# Patient Record
Sex: Female | Born: 1960 | Hispanic: No | Marital: Married | Smoking: Former smoker
Health system: Southern US, Community
[De-identification: ages and names within clinical notes are randomized; demographics above are authoritative.]

## PROBLEM LIST (undated history)

## (undated) DIAGNOSIS — F329 Major depressive disorder, single episode, unspecified: Secondary | ICD-10-CM

## (undated) DIAGNOSIS — R0600 Dyspnea, unspecified: Secondary | ICD-10-CM

## (undated) DIAGNOSIS — E1129 Type 2 diabetes mellitus with other diabetic kidney complication: Secondary | ICD-10-CM

## (undated) DIAGNOSIS — Z973 Presence of spectacles and contact lenses: Secondary | ICD-10-CM

## (undated) DIAGNOSIS — E785 Hyperlipidemia, unspecified: Secondary | ICD-10-CM

## (undated) DIAGNOSIS — N1831 Chronic kidney disease, stage 3a: Secondary | ICD-10-CM

## (undated) DIAGNOSIS — G5600 Carpal tunnel syndrome, unspecified upper limb: Secondary | ICD-10-CM

## (undated) DIAGNOSIS — F32A Depression, unspecified: Secondary | ICD-10-CM

## (undated) DIAGNOSIS — I10 Essential (primary) hypertension: Secondary | ICD-10-CM

## (undated) DIAGNOSIS — D649 Anemia, unspecified: Secondary | ICD-10-CM

## (undated) DIAGNOSIS — M810 Age-related osteoporosis without current pathological fracture: Secondary | ICD-10-CM

## (undated) DIAGNOSIS — E119 Type 2 diabetes mellitus without complications: Secondary | ICD-10-CM

## (undated) DIAGNOSIS — M1990 Unspecified osteoarthritis, unspecified site: Secondary | ICD-10-CM

## (undated) DIAGNOSIS — F419 Anxiety disorder, unspecified: Secondary | ICD-10-CM

## (undated) DIAGNOSIS — J449 Chronic obstructive pulmonary disease, unspecified: Secondary | ICD-10-CM

## (undated) DIAGNOSIS — J45909 Unspecified asthma, uncomplicated: Secondary | ICD-10-CM

## (undated) DIAGNOSIS — N189 Chronic kidney disease, unspecified: Secondary | ICD-10-CM

## (undated) DIAGNOSIS — K219 Gastro-esophageal reflux disease without esophagitis: Secondary | ICD-10-CM

## (undated) DIAGNOSIS — E039 Hypothyroidism, unspecified: Secondary | ICD-10-CM

## (undated) HISTORY — PX: OTHER SURGICAL HISTORY: SHX169

---
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
## 1991-01-16 HISTORY — PX: COLONOSCOPY WITH ESOPHAGOGASTRODUODENOSCOPY (EGD): SHX5779

## 1991-09-07 HISTORY — PX: TUBAL LIGATION: SHX77

## 1996-09-06 HISTORY — PX: ABDOMINAL HYSTERECTOMY: SHX81

## 2006-04-25 ENCOUNTER — Ambulatory Visit: Payer: Self-pay | Admitting: Family Medicine

## 2006-05-18 ENCOUNTER — Ambulatory Visit: Payer: Self-pay

## 2006-08-11 ENCOUNTER — Ambulatory Visit: Payer: Self-pay | Admitting: Family Medicine

## 2007-09-21 ENCOUNTER — Ambulatory Visit: Payer: Self-pay | Admitting: Family Medicine

## 2009-02-03 ENCOUNTER — Ambulatory Visit: Payer: Self-pay | Admitting: Unknown Physician Specialty

## 2009-02-04 ENCOUNTER — Ambulatory Visit: Payer: Self-pay | Admitting: Unknown Physician Specialty

## 2009-03-06 ENCOUNTER — Ambulatory Visit: Payer: Self-pay | Admitting: Unknown Physician Specialty

## 2009-06-05 ENCOUNTER — Ambulatory Visit: Payer: Self-pay | Admitting: Orthopedic Surgery

## 2009-09-06 HISTORY — PX: ROTATOR CUFF REPAIR: SHX139

## 2009-10-23 ENCOUNTER — Ambulatory Visit: Payer: Self-pay | Admitting: Orthopedic Surgery

## 2009-10-30 ENCOUNTER — Ambulatory Visit: Payer: Self-pay | Admitting: Orthopedic Surgery

## 2009-12-12 ENCOUNTER — Ambulatory Visit: Payer: Self-pay | Admitting: Orthopedic Surgery

## 2010-03-04 ENCOUNTER — Ambulatory Visit: Payer: Self-pay | Admitting: Unknown Physician Specialty

## 2011-05-19 ENCOUNTER — Ambulatory Visit: Payer: Self-pay | Admitting: Unknown Physician Specialty

## 2011-09-07 HISTORY — PX: CHOLECYSTECTOMY: SHX55

## 2011-10-28 ENCOUNTER — Ambulatory Visit: Payer: Self-pay | Admitting: Internal Medicine

## 2011-12-07 ENCOUNTER — Ambulatory Visit: Payer: Self-pay | Admitting: Unknown Physician Specialty

## 2011-12-09 ENCOUNTER — Ambulatory Visit: Payer: Self-pay | Admitting: Surgery

## 2012-03-03 ENCOUNTER — Ambulatory Visit: Payer: Self-pay | Admitting: Unknown Physician Specialty

## 2012-03-03 HISTORY — PX: COLONOSCOPY WITH ESOPHAGOGASTRODUODENOSCOPY (EGD): SHX5779

## 2012-03-07 LAB — PATHOLOGY REPORT

## 2012-06-13 ENCOUNTER — Ambulatory Visit: Payer: Self-pay | Admitting: Physician Assistant

## 2012-06-16 ENCOUNTER — Ambulatory Visit: Payer: Self-pay | Admitting: Physician Assistant

## 2013-06-19 ENCOUNTER — Ambulatory Visit: Payer: Self-pay | Admitting: Physician Assistant

## 2014-06-20 ENCOUNTER — Ambulatory Visit: Payer: Self-pay | Admitting: Physician Assistant

## 2014-12-29 NOTE — Op Note (Signed)
PATIENT NAME:  Wanda Smith, Wanda Smith MR#:  [id number] DATE OF BIRTH:  11/11/1960  DATE OF PROCEDURE:  12/09/2011  PREOPERATIVE DIAGNOSIS: Chronic cholecystitis, cholelithiasis.   POSTOPERATIVE DIAGNOSIS: Chronic cholecystitis, cholelithiasis.   PROCEDURE: Laparoscopic cholecystectomy, cholangiogram.   SURGEON: Adella HareJ. Wilton Arturo Sofranko, MD  ANESTHESIA: General.   INDICATION: This 54 year old female has history of epigastric pains. She had ultrasound findings of gallstones, also fatty liver. Surgery was recommended for definitive treatment.   DESCRIPTION OF PROCEDURE: The patient was placed on the operating table in the supine position under general endotracheal anesthesia. The abdomen was prepared with ChloraPrep, draped in a sterile manner.   A short incision was made in the inferior aspect of the umbilicus and carried down to the deep fascia which was grasped with a Kocher clamp and elevated. A Veress needle was inserted, aspirated, and irrigated with a saline solution. Next, the peritoneal cavity was inflated with carbon dioxide. The Veress needle was removed. The 10 mm cannula was inserted. The 10 mm, 0 degree laparoscope was inserted to view the peritoneal cavity. The liver appeared normal. The gallbladder appeared to have a slightly thickened wall. There was no other specific findings with brief survey of the abdomen. The patient was placed in the reverse Trendelenburg position and turned 5 degrees to the left. The next incision was made in the epigastrium slightly to the right of the midline to insert an 11 mm cannula. Two incisions were made in the lateral aspect of the right upper quadrant to introduce two 5 mm cannulas.   The gallbladder was retracted towards the right shoulder. The infundibulum was retracted inferiorly and laterally. The porta hepatis was demonstrated. The cystic duct was dissected free from surrounding structures. The neck of the gallbladder was mobilized with incision of visceral  peritoneum. The cystic artery was dissected free from surrounding structures. A critical view of safety was demonstrated. An endoclip was placed across the cystic duct adjacent to the neck of the gallbladder. An incision was made in the cystic duct to introduce a Reddick catheter. Half-strength Conray-60 dye was injected as the cholangiogram was done with fluoroscopy viewing a slightly dilated biliary tree and prompt flow of dye into the duodenum. No retained stones were seen. The Reddick catheter was removed. The cystic duct was doubly ligated with endoclips and divided. The cystic artery was controlled with double endoclips and divided. One other small branch of the cystic artery was controlled with an endoclip and divided. The gallbladder was dissected free from the liver with hook and cautery. Hemostasis was intact. The gallbladder was delivered up through the infraumbilical incision, opened, suctioned, stone forceps was used to crush a stone and several stones were removed and the gallbladder was removed and submitted in formalin with stones for routine pathology. The right upper quadrant was further inspected. Hemostasis was intact. The cannulas were removed. Carbon dioxide allowed to escape from the peritoneal cavity. Skin incisions were closed with interrupted 5-0 chromic subcuticular sutures, benzoin, and Steri-Strips. Dressings were applied with paper tape. The patient tolerated surgery satisfactorily and was then prepared for transfer to the recovery room.  ____________________________ Shela CommonsJ. Renda RollsWilton Ann Bohne, MD jws:cms D: 12/09/2011 10:16:49 ET T: 12/09/2011 11:11:59 ET JOB#: [id number]  cc: Adella HareJ. Wilton Vivien Barretto, MD, <Dictator> Adella HareWILTON J Cornie Herrington MD ELECTRONICALLY SIGNED 12/09/2011 17:20

## 2015-07-08 ENCOUNTER — Other Ambulatory Visit: Payer: Self-pay | Admitting: Physician Assistant

## 2015-07-08 DIAGNOSIS — Z1231 Encounter for screening mammogram for malignant neoplasm of breast: Secondary | ICD-10-CM

## 2015-07-10 ENCOUNTER — Ambulatory Visit: Payer: Self-pay

## 2015-07-14 ENCOUNTER — Ambulatory Visit: Payer: Self-pay | Attending: Physician Assistant

## 2015-08-29 ENCOUNTER — Ambulatory Visit
Admission: RE | Admit: 2015-08-29 | Discharge: 2015-08-29 | Disposition: A | Discharge status: Home / Self Care | Source: Ambulatory Visit | Attending: Physician Assistant | Admitting: Physician Assistant

## 2015-08-29 DIAGNOSIS — Z1231 Encounter for screening mammogram for malignant neoplasm of breast: Secondary | ICD-10-CM | POA: Diagnosis not present

## 2016-06-02 ENCOUNTER — Other Ambulatory Visit: Payer: Self-pay | Admitting: Physician Assistant

## 2016-06-02 DIAGNOSIS — Z1231 Encounter for screening mammogram for malignant neoplasm of breast: Secondary | ICD-10-CM

## 2016-07-26 ENCOUNTER — Other Ambulatory Visit: Payer: Self-pay | Admitting: Physician Assistant

## 2016-07-26 DIAGNOSIS — R7989 Other specified abnormal findings of blood chemistry: Secondary | ICD-10-CM

## 2016-08-10 ENCOUNTER — Ambulatory Visit
Admission: RE | Admit: 2016-08-10 | Discharge: 2016-08-10 | Disposition: A | Discharge status: Home / Self Care | Source: Ambulatory Visit | Attending: Physician Assistant | Admitting: Physician Assistant

## 2016-08-10 DIAGNOSIS — R7989 Other specified abnormal findings of blood chemistry: Secondary | ICD-10-CM | POA: Insufficient documentation

## 2016-09-01 ENCOUNTER — Ambulatory Visit

## 2016-09-21 ENCOUNTER — Ambulatory Visit

## 2016-10-19 ENCOUNTER — Ambulatory Visit
Admission: RE | Admit: 2016-10-19 | Discharge: 2016-10-19 | Disposition: A | Discharge status: Home / Self Care | Source: Ambulatory Visit | Attending: Physician Assistant | Admitting: Physician Assistant

## 2016-10-19 DIAGNOSIS — Z1231 Encounter for screening mammogram for malignant neoplasm of breast: Secondary | ICD-10-CM | POA: Diagnosis not present

## 2017-04-04 ENCOUNTER — Other Ambulatory Visit: Payer: Self-pay | Admitting: Surgery

## 2017-04-04 ENCOUNTER — Other Ambulatory Visit (HOSPITAL_COMMUNITY): Payer: Self-pay | Admitting: Surgery

## 2017-04-04 DIAGNOSIS — M25561 Pain in right knee: Secondary | ICD-10-CM

## 2017-04-04 DIAGNOSIS — S83231A Complex tear of medial meniscus, current injury, right knee, initial encounter: Secondary | ICD-10-CM

## 2017-04-11 ENCOUNTER — Ambulatory Visit (HOSPITAL_COMMUNITY)
Admission: RE | Admit: 2017-04-11 | Discharge: 2017-04-11 | Disposition: A | Discharge status: Home / Self Care | Source: Ambulatory Visit | Attending: Surgery | Admitting: Surgery

## 2017-04-11 DIAGNOSIS — M25561 Pain in right knee: Secondary | ICD-10-CM | POA: Diagnosis not present

## 2017-04-11 DIAGNOSIS — M25861 Other specified joint disorders, right knee: Secondary | ICD-10-CM | POA: Insufficient documentation

## 2017-04-11 DIAGNOSIS — S83231A Complex tear of medial meniscus, current injury, right knee, initial encounter: Secondary | ICD-10-CM

## 2017-09-13 ENCOUNTER — Other Ambulatory Visit: Payer: Self-pay | Admitting: Physician Assistant

## 2017-09-13 DIAGNOSIS — Z1231 Encounter for screening mammogram for malignant neoplasm of breast: Secondary | ICD-10-CM

## 2017-10-20 ENCOUNTER — Ambulatory Visit
Admission: RE | Admit: 2017-10-20 | Discharge: 2017-10-20 | Disposition: A | Discharge status: Home / Self Care | Source: Ambulatory Visit | Attending: Physician Assistant | Admitting: Physician Assistant

## 2017-10-20 DIAGNOSIS — Z1231 Encounter for screening mammogram for malignant neoplasm of breast: Secondary | ICD-10-CM | POA: Insufficient documentation

## 2017-10-27 ENCOUNTER — Ambulatory Visit: Admitting: *Deleted

## 2017-11-22 DIAGNOSIS — H524 Presbyopia: Secondary | ICD-10-CM | POA: Diagnosis not present

## 2018-01-02 DIAGNOSIS — G5601 Carpal tunnel syndrome, right upper limb: Secondary | ICD-10-CM | POA: Diagnosis not present

## 2018-01-12 ENCOUNTER — Encounter: Payer: Self-pay | Admitting: *Deleted

## 2018-01-13 ENCOUNTER — Encounter
Admission: RE | Disposition: A | Discharge status: Home / Self Care | Payer: Self-pay | Source: Ambulatory Visit | Attending: Unknown Physician Specialty

## 2018-01-13 ENCOUNTER — Encounter: Payer: Self-pay | Admitting: *Deleted

## 2018-01-13 ENCOUNTER — Ambulatory Visit: Payer: 59 | Admitting: Anesthesiology

## 2018-01-13 ENCOUNTER — Ambulatory Visit
Admission: RE | Admit: 2018-01-13 | Discharge: 2018-01-13 | Disposition: A | Discharge status: Home / Self Care | Payer: 59 | Source: Ambulatory Visit | Attending: Unknown Physician Specialty | Admitting: Unknown Physician Specialty

## 2018-01-13 DIAGNOSIS — K640 First degree hemorrhoids: Secondary | ICD-10-CM | POA: Diagnosis not present

## 2018-01-13 DIAGNOSIS — Z7984 Long term (current) use of oral hypoglycemic drugs: Secondary | ICD-10-CM | POA: Diagnosis not present

## 2018-01-13 DIAGNOSIS — E119 Type 2 diabetes mellitus without complications: Secondary | ICD-10-CM | POA: Insufficient documentation

## 2018-01-13 DIAGNOSIS — Z87891 Personal history of nicotine dependence: Secondary | ICD-10-CM | POA: Insufficient documentation

## 2018-01-13 DIAGNOSIS — K621 Rectal polyp: Secondary | ICD-10-CM | POA: Diagnosis not present

## 2018-01-13 DIAGNOSIS — F419 Anxiety disorder, unspecified: Secondary | ICD-10-CM | POA: Insufficient documentation

## 2018-01-13 DIAGNOSIS — I10 Essential (primary) hypertension: Secondary | ICD-10-CM | POA: Insufficient documentation

## 2018-01-13 DIAGNOSIS — Z79899 Other long term (current) drug therapy: Secondary | ICD-10-CM | POA: Diagnosis not present

## 2018-01-13 DIAGNOSIS — F329 Major depressive disorder, single episode, unspecified: Secondary | ICD-10-CM | POA: Insufficient documentation

## 2018-01-13 DIAGNOSIS — E039 Hypothyroidism, unspecified: Secondary | ICD-10-CM | POA: Diagnosis not present

## 2018-01-13 DIAGNOSIS — Z1211 Encounter for screening for malignant neoplasm of colon: Secondary | ICD-10-CM | POA: Insufficient documentation

## 2018-01-13 DIAGNOSIS — Z86010 Personal history of colonic polyps: Secondary | ICD-10-CM | POA: Diagnosis not present

## 2018-01-13 HISTORY — PX: COLONOSCOPY WITH PROPOFOL: SHX5780

## 2018-01-13 HISTORY — DX: Essential (primary) hypertension: I10

## 2018-01-13 HISTORY — DX: Hypothyroidism, unspecified: E03.9

## 2018-01-13 HISTORY — DX: Type 2 diabetes mellitus without complications: E11.9

## 2018-01-13 HISTORY — DX: Anxiety disorder, unspecified: F41.9

## 2018-01-13 HISTORY — DX: Depression, unspecified: F32.A

## 2018-01-13 HISTORY — DX: Hyperlipidemia, unspecified: E78.5

## 2018-01-13 HISTORY — DX: Major depressive disorder, single episode, unspecified: F32.9

## 2018-01-13 HISTORY — DX: Unspecified osteoarthritis, unspecified site: M19.90

## 2018-01-13 HISTORY — DX: Unspecified asthma, uncomplicated: J45.909

## 2018-01-13 HISTORY — DX: Dyspnea, unspecified: R06.00

## 2018-01-13 LAB — GLUCOSE, CAPILLARY: GLUCOSE-CAPILLARY: 102 mg/dL — AB (ref 65–99)

## 2018-01-13 SURGERY — COLONOSCOPY WITH PROPOFOL
Anesthesia: General

## 2018-01-13 MED ORDER — MIDAZOLAM HCL 2 MG/2ML IJ SOLN
INTRAMUSCULAR | Status: AC
  Filled 2018-01-13: qty 2

## 2018-01-13 MED ORDER — FENTANYL CITRATE (PF) 100 MCG/2ML IJ SOLN
INTRAMUSCULAR | Status: DC | PRN
  Administered 2018-01-13: 25 ug via INTRAVENOUS
  Administered 2018-01-13: 50 ug via INTRAVENOUS
  Administered 2018-01-13: 25 ug via INTRAVENOUS

## 2018-01-13 MED ORDER — SODIUM CHLORIDE 0.9 % IV SOLN
INTRAVENOUS | Status: DC
  Administered 2018-01-13: 10:00:00 via INTRAVENOUS

## 2018-01-13 MED ORDER — EPHEDRINE SULFATE 50 MG/ML IJ SOLN
INTRAMUSCULAR | Status: AC
  Filled 2018-01-13: qty 1

## 2018-01-13 MED ORDER — PROPOFOL 500 MG/50ML IV EMUL
INTRAVENOUS | Status: DC | PRN
  Administered 2018-01-13: 120 ug/kg/min via INTRAVENOUS

## 2018-01-13 MED ORDER — PROPOFOL 500 MG/50ML IV EMUL
INTRAVENOUS | Status: AC
  Filled 2018-01-13: qty 50

## 2018-01-13 MED ORDER — SODIUM CHLORIDE 0.9 % IV SOLN
INTRAVENOUS | Status: DC
  Administered 2018-01-13: 1000 mL via INTRAVENOUS

## 2018-01-13 MED ORDER — FENTANYL CITRATE (PF) 100 MCG/2ML IJ SOLN
INTRAMUSCULAR | Status: AC
  Filled 2018-01-13: qty 2

## 2018-01-13 MED ORDER — MIDAZOLAM HCL 2 MG/2ML IJ SOLN
INTRAMUSCULAR | Status: DC | PRN
  Administered 2018-01-13: 2 mg via INTRAVENOUS

## 2018-01-13 NOTE — H&P (Signed)
Primary Care Physician:  Patrice Paradise, MD Primary Gastroenterologist:  Dr. Mechele Collin  Pre-Procedure History & Physical: HPI:  Wanda Smith is a 57 y.o. female is here for an colonoscopy.  Done for [facility] colon polyps.   Past Medical History:  Diagnosis Date  . Anxiety   . Arthritis   . Asthma   . Depression   . Diabetes mellitus without complication (HCC)   . Dyspnea   . Hypertension   . Hypothyroidism   . Increased serum lipids     Past Surgical History:  Procedure Laterality Date  . ABDOMINAL HYSTERECTOMY    . CHOLECYSTECTOMY    . COLONOSCOPY WITH ESOPHAGOGASTRODUODENOSCOPY (EGD)    . HERNIA REPAIR    . left rotator cuff repair Left   . TUBAL LIGATION      Prior to Admission medications   Medication Sig Start Date End Date Taking? Authorizing Provider  albuterol (PROVENTIL HFA;VENTOLIN HFA) 108 (90 Base) MCG/ACT inhaler Inhale into the lungs every 6 (six) hours as needed for wheezing or shortness of breath.   Yes [provider]  azelastine (ASTELIN) 0.1 % nasal spray Place into both nostrils 2 (two) times daily. Use in each nostril as directed   Yes [provider]  citalopram (CELEXA) 10 MG tablet Take 10 mg by mouth daily.   Yes [provider]  cycloSPORINE (RESTASIS) 0.05 % ophthalmic emulsion 1 drop 2 (two) times daily.   Yes [provider]  fluticasone (FLONASE) 50 MCG/ACT nasal spray Place into both nostrils daily.   Yes [provider]  Fluticasone-Salmeterol (ADVAIR) 250-50 MCG/DOSE AEPB Inhale 1 puff into the lungs 2 (two) times daily.   Yes [provider]  levothyroxine (SYNTHROID, LEVOTHROID) 75 MCG tablet Take 75 mcg by mouth daily before breakfast.   Yes [provider]  lisinopril (PRINIVIL,ZESTRIL) 40 MG tablet Take 40 mg by mouth daily.   Yes [provider]  meloxicam (MOBIC) 7.5 MG tablet Take 7.5 mg by mouth daily.   Yes [provider]  metFORMIN (GLUCOPHAGE)  1000 MG tablet Take 1,000 mg by mouth 2 (two) times daily with a meal.   Yes [provider]  montelukast (SINGULAIR) 10 MG tablet Take 10 mg by mouth at bedtime.   Yes [provider]  rosuvastatin (CRESTOR) 10 MG tablet Take 10 mg by mouth daily.   Yes [provider]    Allergies as of 12/27/2017  . (Not on File)    Family History  Problem Relation Age of Onset  . Breast cancer Mother 5  . Breast cancer Sister 69    Social History   Socioeconomic History  . Marital status: Married    Spouse name: Not on file  . Number of children: Not on file  . Years of education: Not on file  . Highest education level: Not on file  Occupational History  . Not on file  Social Needs  . Financial resource strain: Not on file  . Food insecurity:    Worry: Not on file    Inability: Not on file  . Transportation needs:    Medical: Not on file    Non-medical: Not on file  Tobacco Use  . Smoking status: Former Games developer  . Smokeless tobacco: Never Used  Substance and Sexual Activity  . Alcohol use: Never    Frequency: Never  . Drug use: Never  . Sexual activity: Not on file  Lifestyle  . Physical activity:    Days  per week: Not on file    Minutes per session: Not on file  . Stress: Not on file  Relationships  . Social connections:    Talks on phone: Not on file    Gets together: Not on file    Attends religious service: Not on file    Active member of club or organization: Not on file    Attends meetings of clubs or organizations: Not on file    Relationship status: Not on file  . Intimate partner violence:    Fear of current or ex partner: Not on file    Emotionally abused: Not on file    Physically abused: Not on file    Forced sexual activity: Not on file  Other Topics Concern  . Not on file  Social History Narrative  . Not on file    Review of Systems: See HPI, otherwise negative ROS  Physical Exam: BP (!) 142/74   Pulse (!) 59   Temp  (!) 97.2 F (36.2 C) (Tympanic)   Resp 18   Ht  (1.626 m)   Wt 71.7 kg (158 lb)   SpO2 100%   BMI 27.12 kg/m  General:   Alert,  pleasant and cooperative in NAD Head:  Normocephalic and atraumatic. Neck:  Supple; no masses or thyromegaly. Lungs:  Clear throughout to auscultation.    Heart:  Regular rate and rhythm. Abdomen:  Soft, nontender and nondistended. Normal bowel sounds, without guarding, and without rebound.   Neurologic:  Alert and  oriented x4;  grossly normal neurologically.  Impression/Plan: Wanda Smith is here for an colonoscopy to be performed for [facility] colon polyps  Risks, benefits, limitations, and alternatives regarding  colonoscopy have been reviewed with the patient.  Questions have been answered.  All parties agreeable.   Wanda Prude, MD  01/13/2018, 10:11 AM

## 2018-01-13 NOTE — Transfer of Care (Signed)
Immediate Anesthesia Transfer of Care Note  Patient: Wanda Smith  Procedure(s) Performed: COLONOSCOPY WITH PROPOFOL (N/A )  Patient Location: PACU  Anesthesia Type:General  Level of Consciousness: awake and sedated  Airway & Oxygen Therapy: Patient Spontanous Breathing and Patient connected to nasal cannula oxygen  Post-op Assessment: Report given to RN and Post -op Vital signs reviewed and stable  Post vital signs: Reviewed and stable  Last Vitals:  Vitals Value Taken Time  BP    Temp    Pulse    Resp    SpO2      Last Pain:  Vitals:   01/13/18 0930  TempSrc: Tympanic  PainSc: 0-No pain         Complications: No apparent anesthesia complications

## 2018-01-13 NOTE — Anesthesia Preprocedure Evaluation (Signed)
Anesthesia Evaluation  Patient identified by MRN, date of birth, ID band Patient awake    Reviewed: Allergy & Precautions, H&P , NPO status , Patient's Chart, lab work & pertinent test results, reviewed documented beta blocker date and time   Airway Mallampati: II   Neck ROM: full    Dental  (+) Teeth Intact   Pulmonary neg pulmonary ROS, shortness of breath and with exertion, asthma , former smoker,    Pulmonary exam normal        Cardiovascular Exercise Tolerance: Good hypertension, On Medications negative cardio ROS Normal cardiovascular exam Rhythm:regular Rate:Normal     Neuro/Psych PSYCHIATRIC DISORDERS Anxiety Depression negative neurological ROS  negative psych ROS   GI/Hepatic negative GI ROS, Neg liver ROS,   Endo/Other  negative endocrine ROSdiabetes, Well Controlled, Type 2, Oral Hypoglycemic AgentsHypothyroidism   Renal/GU negative Renal ROS  negative genitourinary   Musculoskeletal   Abdominal   Peds  Hematology negative hematology ROS (+)   Anesthesia Other Findings Past Medical History: No date: Anxiety No date: Arthritis No date: Asthma No date: Depression No date: Diabetes mellitus without complication (HCC) No date: Dyspnea No date: Hypertension No date: Hypothyroidism No date: Increased serum lipids Past Surgical History: No date: ABDOMINAL HYSTERECTOMY No date: CHOLECYSTECTOMY No date: COLONOSCOPY WITH ESOPHAGOGASTRODUODENOSCOPY (EGD) No date: HERNIA REPAIR No date: left rotator cuff repair; Left No date: TUBAL LIGATION BMI    Body Mass Index:  27.12 kg/m     Reproductive/Obstetrics negative OB ROS                             Anesthesia Physical Anesthesia Plan  ASA: III  Anesthesia Plan: General   Post-op Pain Management:    Induction:   PONV Risk Score and Plan:   Airway Management Planned:   Additional Equipment:   Intra-op Plan:    Post-operative Plan:   Informed Consent: I have reviewed the patients History and Physical, chart, labs and discussed the procedure including the risks, benefits and alternatives for the proposed anesthesia with the patient or authorized representative who has indicated his/her understanding and acceptance.   Dental Advisory Given  Plan Discussed with: CRNA  Anesthesia Plan Comments:         Anesthesia Quick Evaluation

## 2018-01-13 NOTE — Op Note (Signed)
[facility] Patient Name: Wanda Smith Procedure Date: 01/13/2018 10:15 AM MRN: [id number] Account #: [id number] Date of Birth: [birth year] Admit Type: Outpatient Age: 57 Room: [facility] ENDO ROOM 3 Gender: Female Note Status: Finalized Procedure:            Colonoscopy Indications:          High risk colon cancer surveillance: Personal history                        of colonic polyps Providers:            Scot Jun, MD Referring MD:         Marilynne Halsted, MD (Referring MD) Medicines:            Propofol per Anesthesia Complications:        No immediate complications. Procedure:            Pre-Anesthesia Assessment:                       - After reviewing the risks and benefits, the patient                        was deemed in satisfactory condition to undergo the                        procedure.                       After obtaining informed consent, the colonoscope was                        passed under direct vision. Throughout the procedure,                        the patient's blood pressure, pulse, and oxygen                        saturations were monitored continuously. The                        Colonoscope was introduced through the anus and                        advanced to the the cecum, identified by appendiceal                        orifice and ileocecal valve. The colonoscopy was                        performed without difficulty. The patient tolerated the                        procedure well. The quality of the bowel preparation                        was excellent. Findings:      A diminutive polyp was found in the rectum. The polyp was sessile. The       polyp was removed with a jumbo cold forceps. Resection and retrieval       were complete.      Internal hemorrhoids were found during  endoscopy. The hemorrhoids were       small and Grade I (internal hemorrhoids that do not prolapse).      The exam was  otherwise without abnormality. Impression:           - One diminutive polyp in the rectum, removed with a                        jumbo cold forceps. Resected and retrieved.                       - Internal hemorrhoids.                       - The examination was otherwise normal. Recommendation:       - Await pathology results. Scot Jun, MD 01/13/2018 10:45:31 AM This report has been signed electronically. Number of Addenda: 0 Note Initiated On: 01/13/2018 10:15 AM Scope Withdrawal Time: 0 hours 13 minutes 45 seconds  Total Procedure Duration: 0 hours 23 minutes 9 seconds       [facility]

## 2018-01-13 NOTE — Anesthesia Procedure Notes (Signed)
Performed by: Cook-Martin, Kern Gingras Pre-anesthesia Checklist: Patient identified, Emergency Drugs available, Suction available, Patient being monitored and Timeout performed Patient Re-evaluated:Patient Re-evaluated prior to induction Oxygen Delivery Method: Nasal cannula Preoxygenation: Pre-oxygenation with 100% oxygen Induction Type: IV induction Ventilation: Oral airway inserted - appropriate to patient size Airway Equipment and Method: Oral airway Placement Confirmation: positive ETCO2 and CO2 detector       

## 2018-01-13 NOTE — Anesthesia Post-op Follow-up Note (Signed)
Anesthesia QCDR form completed.        

## 2018-01-15 NOTE — Progress Notes (Signed)
Non-identifying voicemail.  No message left. 

## 2018-01-16 LAB — SURGICAL PATHOLOGY

## 2018-01-16 NOTE — Anesthesia Postprocedure Evaluation (Signed)
Anesthesia Post Note  Patient: Wanda Smith  Procedure(s) Performed: COLONOSCOPY WITH PROPOFOL (N/A )  Patient location during evaluation: PACU Anesthesia Type: General Level of consciousness: awake and alert Pain management: pain level controlled Vital Signs Assessment: post-procedure vital signs reviewed and stable Respiratory status: spontaneous breathing, nonlabored ventilation, respiratory function stable and patient connected to nasal cannula oxygen Cardiovascular status: blood pressure returned to baseline and stable Postop Assessment: no apparent nausea or vomiting Anesthetic complications: no     Last Vitals:  Vitals:   01/13/18 1110 01/13/18 1120  BP:    Pulse: 66 62  Resp: 12 18  Temp:    SpO2: 97% 99%    Last Pain:  Vitals:   01/13/18 1130  TempSrc:   PainSc: 0-No pain                 Yevette Edwards

## 2018-01-18 ENCOUNTER — Encounter: Payer: Self-pay | Admitting: Unknown Physician Specialty

## 2018-01-25 DIAGNOSIS — G5602 Carpal tunnel syndrome, left upper limb: Secondary | ICD-10-CM | POA: Diagnosis not present

## 2018-01-25 DIAGNOSIS — G5601 Carpal tunnel syndrome, right upper limb: Secondary | ICD-10-CM | POA: Diagnosis not present

## 2018-02-06 DIAGNOSIS — E119 Type 2 diabetes mellitus without complications: Secondary | ICD-10-CM | POA: Diagnosis not present

## 2018-02-06 DIAGNOSIS — I10 Essential (primary) hypertension: Secondary | ICD-10-CM | POA: Diagnosis not present

## 2018-02-06 DIAGNOSIS — E782 Mixed hyperlipidemia: Secondary | ICD-10-CM | POA: Diagnosis not present

## 2018-02-13 DIAGNOSIS — E782 Mixed hyperlipidemia: Secondary | ICD-10-CM | POA: Diagnosis not present

## 2018-02-13 DIAGNOSIS — E119 Type 2 diabetes mellitus without complications: Secondary | ICD-10-CM | POA: Diagnosis not present

## 2018-02-13 DIAGNOSIS — I10 Essential (primary) hypertension: Secondary | ICD-10-CM | POA: Diagnosis not present

## 2018-04-04 ENCOUNTER — Ambulatory Visit: Payer: 59 | Admitting: *Deleted

## 2018-04-07 ENCOUNTER — Encounter: Payer: 59 | Attending: Physician Assistant | Admitting: *Deleted

## 2018-04-07 ENCOUNTER — Encounter: Payer: Self-pay | Admitting: *Deleted

## 2018-04-07 VITALS — BP 110/60 | Ht 64.0 in | Wt 147.9 lb

## 2018-04-07 DIAGNOSIS — G5602 Carpal tunnel syndrome, left upper limb: Secondary | ICD-10-CM | POA: Diagnosis not present

## 2018-04-07 DIAGNOSIS — G5601 Carpal tunnel syndrome, right upper limb: Secondary | ICD-10-CM | POA: Diagnosis not present

## 2018-04-07 DIAGNOSIS — E119 Type 2 diabetes mellitus without complications: Secondary | ICD-10-CM | POA: Diagnosis not present

## 2018-04-07 DIAGNOSIS — Z713 Dietary counseling and surveillance: Secondary | ICD-10-CM | POA: Insufficient documentation

## 2018-04-07 DIAGNOSIS — M1711 Unilateral primary osteoarthritis, right knee: Secondary | ICD-10-CM | POA: Diagnosis not present

## 2018-04-07 NOTE — Patient Instructions (Signed)
Check blood sugars 1 x day before breakfast or 2 hrs after supper every day Bring blood sugar records to the next class  Exercise: Continue program for  60  minutes 5 days a week  Eat 3 meals day,   2  snacks a day Space meals 4-6 hours apart Avoid sugar sweetened drinks (coffee)  Return for classes on:

## 2018-04-07 NOTE — Progress Notes (Signed)
Diabetes Self-Management Education  Visit Type: First/Initial  Appt. Start Time: 0910 Appt. End Time: 1005  04/07/2018  Ms. Wanda Smith, identified by name and date of birth, is a 57 y.o. female with a diagnosis of Diabetes: Type 2.   ASSESSMENT  Blood pressure 110/60, height 5\' 4"  (1.626 m), weight 147 lb 14.4 oz (67.1 kg). Body mass index is 25.39 kg/m.  Diabetes Self-Management Education - 04/07/18 1207      Visit Information   Visit Type  First/Initial      Initial Visit   Diabetes Type  Type 2    Are you currently following a meal plan?  No    Are you taking your medications as prescribed?  Yes    Date Diagnosed  1998      Health Coping   How would you rate your overall health?  Good      Psychosocial Assessment   Patient Belief/Attitude about Diabetes  Motivated to manage diabetes "it is a burden"    Self-care barriers  None    Self-management support  Doctor's office;Family    Patient Concerns  Nutrition/Meal planning;Medication;Monitoring;Weight Control;Glycemic Control;Healthy Lifestyle    Special Needs  None    Preferred Learning Style  Auditory;Visual;Hands on    Learning Readiness  Change in progress    How often do you need to have someone help you when you read instructions, pamphlets, or other written materials from your doctor or pharmacy?  1 - Never    What is the last grade level you completed in school?  some college      Pre-Education Assessment   Patient understands the diabetes disease and treatment process.  Needs Review    Patient understands incorporating nutritional management into lifestyle.  Needs Review    Patient undertands incorporating physical activity into lifestyle.  Demonstrates understanding / competency    Patient understands using medications safely.  Needs Instruction    Patient understands monitoring blood glucose, interpreting and using results  Needs Review    Patient understands prevention, detection, and treatment of  acute complications.  Needs Instruction    Patient understands prevention, detection, and treatment of chronic complications.  Needs Review    Patient understands how to develop strategies to address psychosocial issues.  Needs Instruction    Patient understands how to develop strategies to promote health/change behavior.  Needs Instruction      Complications   Last HgB A1C per patient/outside source  6.4 % 02/06/18    How often do you check your blood sugar?  1-2 times/day    Fasting Blood glucose range (mg/dL)  56-387 56-43'P mg/dL    Have you had a dilated eye exam in the past 12 months?  Yes    Have you had a dental exam in the past 12 months?  Yes    Are you checking your feet?  No      Dietary Intake   Breakfast  grilled chicken; eggs with [place] bacon or sausage, spinach, mushrooms    Snack (morning)  fruit, nuts popcorn    Lunch  [employer] microwave meal; grilled chicken with greens, sweet potato    Snack (afternoon)  same as morning snack    Dinner  BBQ, chicken, beef, fish with sweet potato, rice, pasta, peas, beans, non-starchy vegetables, salad    Snack (evening)  occasional sugar free Klondike bar    Beverage(s)  water, coffee with sugar, Crystal light      Exercise   Exercise Type  Moderate (swimming / aerobic walking);Strenuous (running) cardio, boot camp, dance    How many days per week to you exercise?  5    How many minutes per day do you exercise?  60    Total minutes per week of exercise  300      Patient Education   Previous Diabetes Education  No    Disease state   Definition of diabetes, type 1 and 2, and the diagnosis of diabetes;Explored patient's options for treatment of their diabetes    Nutrition management   Role of diet in the treatment of diabetes and the relationship between the three main macronutrients and blood glucose level;Reviewed blood glucose goals for pre and post meals and how to evaluate the patients' food intake on their blood glucose level.     Physical activity and exercise   Role of exercise on diabetes management, blood pressure control and cardiac health; Identified with patient nutritional and/or medication changes necessary with exercise. Pt reported low blood sugar symptoms if she doesn't have a snack before exercise.    Medications  Reviewed patients medication for diabetes, action, purpose, timing of dose and side effects.    Monitoring  Taught/discussed recording of test results and interpretation of SMBG.;Identified appropriate SMBG and/or A1C goals.    Acute complications  Taught treatment of hypoglycemia - the 15 rule.    Chronic complications  Relationship between chronic complications and blood glucose control    Psychosocial adjustment  Identified and addressed patients feelings and concerns about diabetes      Individualized Goals (developed by patient)   Reducing Risk  Improve blood sugars Decrease medications Prevent diabetes complications Lose weight Lead a healthier lifestyle     Outcomes   Expected Outcomes  Demonstrated interest in learning. Expect positive outcomes       Individualized Plan for Diabetes Self-Management Training:   Learning Objective:  Patient will have a greater understanding of diabetes self-management. Patient education plan is to attend individual and/or group sessions per assessed needs and concerns.   Plan:   Patient Instructions  Check blood sugars 1 x day before breakfast or 2 hrs after supper every day Bring blood sugar records to the next class Exercise: Continue program for  60  minutes 5 days a week Eat 3 meals day,   2  snacks a day Space meals 4-6 hours apart Avoid sugar sweetened drinks (coffee)  Expected Outcomes:  Demonstrated interest in learning. Expect positive outcomes  Education material provided:  General Meal Planning Guidelines Simple Meal Plan Symptoms, causes and treatments of Hypoglycemia  If problems or questions, patient to contact team via:   Wanda SettlerSheila Demia Viera, RN, CCM, CDE [phone]  Future DSME appointment:  April 10, 2018 for Diabetes Class 1

## 2018-04-10 ENCOUNTER — Encounter: Payer: 59 | Admitting: Dietician

## 2018-04-10 ENCOUNTER — Encounter: Payer: Self-pay | Admitting: Dietician

## 2018-04-10 VITALS — Ht 64.0 in | Wt 150.3 lb

## 2018-04-10 DIAGNOSIS — E119 Type 2 diabetes mellitus without complications: Secondary | ICD-10-CM

## 2018-04-10 DIAGNOSIS — Z713 Dietary counseling and surveillance: Secondary | ICD-10-CM | POA: Diagnosis not present

## 2018-04-10 NOTE — Progress Notes (Signed)

## 2018-04-17 ENCOUNTER — Encounter: Payer: Self-pay | Admitting: Dietician

## 2018-04-17 ENCOUNTER — Encounter: Payer: 59 | Admitting: Dietician

## 2018-04-17 VITALS — Wt 148.9 lb

## 2018-04-17 DIAGNOSIS — Z713 Dietary counseling and surveillance: Secondary | ICD-10-CM | POA: Diagnosis not present

## 2018-04-17 DIAGNOSIS — E119 Type 2 diabetes mellitus without complications: Secondary | ICD-10-CM

## 2018-04-17 NOTE — Progress Notes (Signed)

## 2018-04-21 DIAGNOSIS — M1711 Unilateral primary osteoarthritis, right knee: Secondary | ICD-10-CM | POA: Diagnosis not present

## 2018-04-24 ENCOUNTER — Encounter: Payer: 59 | Admitting: Dietician

## 2018-04-24 ENCOUNTER — Encounter: Payer: Self-pay | Admitting: Dietician

## 2018-04-24 VITALS — BP 156/84 | Ht 64.0 in | Wt 153.8 lb

## 2018-04-24 DIAGNOSIS — E119 Type 2 diabetes mellitus without complications: Secondary | ICD-10-CM

## 2018-04-24 DIAGNOSIS — Z713 Dietary counseling and surveillance: Secondary | ICD-10-CM | POA: Diagnosis not present

## 2018-04-24 NOTE — Progress Notes (Signed)

## 2018-04-25 ENCOUNTER — Encounter: Payer: Self-pay | Admitting: *Deleted

## 2018-04-28 DIAGNOSIS — M1711 Unilateral primary osteoarthritis, right knee: Secondary | ICD-10-CM | POA: Diagnosis not present

## 2018-05-05 DIAGNOSIS — M1712 Unilateral primary osteoarthritis, left knee: Secondary | ICD-10-CM | POA: Diagnosis not present

## 2018-05-05 DIAGNOSIS — M1711 Unilateral primary osteoarthritis, right knee: Secondary | ICD-10-CM | POA: Diagnosis not present

## 2018-05-25 DIAGNOSIS — E782 Mixed hyperlipidemia: Secondary | ICD-10-CM | POA: Diagnosis not present

## 2018-05-25 DIAGNOSIS — E039 Hypothyroidism, unspecified: Secondary | ICD-10-CM | POA: Diagnosis not present

## 2018-05-25 DIAGNOSIS — I10 Essential (primary) hypertension: Secondary | ICD-10-CM | POA: Diagnosis not present

## 2018-05-25 DIAGNOSIS — E119 Type 2 diabetes mellitus without complications: Secondary | ICD-10-CM | POA: Diagnosis not present

## 2018-05-25 DIAGNOSIS — R5383 Other fatigue: Secondary | ICD-10-CM | POA: Diagnosis not present

## 2018-05-25 DIAGNOSIS — R05 Cough: Secondary | ICD-10-CM | POA: Diagnosis not present

## 2018-05-25 DIAGNOSIS — M2550 Pain in unspecified joint: Secondary | ICD-10-CM | POA: Diagnosis not present

## 2018-07-03 DIAGNOSIS — M1711 Unilateral primary osteoarthritis, right knee: Secondary | ICD-10-CM | POA: Diagnosis not present

## 2018-07-03 DIAGNOSIS — M1712 Unilateral primary osteoarthritis, left knee: Secondary | ICD-10-CM | POA: Diagnosis not present

## 2018-07-03 DIAGNOSIS — G5601 Carpal tunnel syndrome, right upper limb: Secondary | ICD-10-CM | POA: Diagnosis not present

## 2018-07-19 ENCOUNTER — Other Ambulatory Visit: Payer: Self-pay

## 2018-07-19 ENCOUNTER — Encounter: Payer: Self-pay | Admitting: *Deleted

## 2018-07-26 ENCOUNTER — Ambulatory Visit: Payer: 59 | Admitting: Anesthesiology

## 2018-07-26 ENCOUNTER — Ambulatory Visit
Admission: RE | Admit: 2018-07-26 | Discharge: 2018-07-26 | Disposition: A | Discharge status: Home / Self Care | Payer: 59 | Source: Ambulatory Visit | Attending: Surgery | Admitting: Surgery

## 2018-07-26 ENCOUNTER — Encounter
Admission: RE | Disposition: A | Discharge status: Home / Self Care | Payer: Self-pay | Source: Ambulatory Visit | Attending: Surgery

## 2018-07-26 DIAGNOSIS — G5601 Carpal tunnel syndrome, right upper limb: Secondary | ICD-10-CM | POA: Diagnosis not present

## 2018-07-26 DIAGNOSIS — Z7951 Long term (current) use of inhaled steroids: Secondary | ICD-10-CM | POA: Diagnosis not present

## 2018-07-26 DIAGNOSIS — E039 Hypothyroidism, unspecified: Secondary | ICD-10-CM | POA: Diagnosis not present

## 2018-07-26 DIAGNOSIS — J449 Chronic obstructive pulmonary disease, unspecified: Secondary | ICD-10-CM | POA: Insufficient documentation

## 2018-07-26 DIAGNOSIS — E785 Hyperlipidemia, unspecified: Secondary | ICD-10-CM | POA: Insufficient documentation

## 2018-07-26 DIAGNOSIS — K219 Gastro-esophageal reflux disease without esophagitis: Secondary | ICD-10-CM | POA: Diagnosis not present

## 2018-07-26 DIAGNOSIS — Z87891 Personal history of nicotine dependence: Secondary | ICD-10-CM | POA: Diagnosis not present

## 2018-07-26 DIAGNOSIS — E119 Type 2 diabetes mellitus without complications: Secondary | ICD-10-CM | POA: Diagnosis not present

## 2018-07-26 DIAGNOSIS — Z79890 Hormone replacement therapy (postmenopausal): Secondary | ICD-10-CM | POA: Diagnosis not present

## 2018-07-26 DIAGNOSIS — I10 Essential (primary) hypertension: Secondary | ICD-10-CM | POA: Diagnosis not present

## 2018-07-26 DIAGNOSIS — Z79899 Other long term (current) drug therapy: Secondary | ICD-10-CM | POA: Insufficient documentation

## 2018-07-26 DIAGNOSIS — Z7984 Long term (current) use of oral hypoglycemic drugs: Secondary | ICD-10-CM | POA: Insufficient documentation

## 2018-07-26 DIAGNOSIS — Z791 Long term (current) use of non-steroidal anti-inflammatories (NSAID): Secondary | ICD-10-CM | POA: Diagnosis not present

## 2018-07-26 DIAGNOSIS — E0500 Thyrotoxicosis with diffuse goiter without thyrotoxic crisis or storm: Secondary | ICD-10-CM | POA: Diagnosis not present

## 2018-07-26 DIAGNOSIS — Z888 Allergy status to other drugs, medicaments and biological substances status: Secondary | ICD-10-CM | POA: Insufficient documentation

## 2018-07-26 HISTORY — DX: Presence of spectacles and contact lenses: Z97.3

## 2018-07-26 HISTORY — PX: CARPAL TUNNEL RELEASE: SHX101

## 2018-07-26 LAB — GLUCOSE, CAPILLARY
Glucose-Capillary: 96 mg/dL (ref 70–99)
Glucose-Capillary: 102 mg/dL — ABNORMAL HIGH (ref 70–99)

## 2018-07-26 SURGERY — RELEASE, CARPAL TUNNEL, ENDOSCOPIC
Anesthesia: Regional | Site: Hand | Laterality: Right

## 2018-07-26 MED ORDER — METOCLOPRAMIDE HCL 5 MG/ML IJ SOLN: 5.0000 mg | Freq: Three times a day (TID) | INTRAMUSCULAR | Status: DC | PRN

## 2018-07-26 MED ORDER — PROPOFOL 500 MG/50ML IV EMUL
INTRAVENOUS | Status: DC | PRN
  Administered 2018-07-26: 25 ug/kg/min via INTRAVENOUS

## 2018-07-26 MED ORDER — FENTANYL CITRATE (PF) 100 MCG/2ML IJ SOLN
INTRAMUSCULAR | Status: DC | PRN
  Administered 2018-07-26 (×2): 50 ug via INTRAVENOUS

## 2018-07-26 MED ORDER — MIDAZOLAM HCL 5 MG/5ML IJ SOLN
INTRAMUSCULAR | Status: DC | PRN
  Administered 2018-07-26: 2 mg via INTRAVENOUS

## 2018-07-26 MED ORDER — ONDANSETRON HCL 4 MG PO TABS: 4.0000 mg | ORAL_TABLET | Freq: Four times a day (QID) | ORAL | Status: DC | PRN

## 2018-07-26 MED ORDER — OXYCODONE HCL 5 MG PO TABS: 5.0000 mg | ORAL_TABLET | Freq: Once | ORAL | Status: DC | PRN

## 2018-07-26 MED ORDER — BUPIVACAINE HCL (PF) 0.5 % IJ SOLN
INTRAMUSCULAR | Status: DC | PRN
  Administered 2018-07-26: 10 mL

## 2018-07-26 MED ORDER — OXYCODONE HCL 5 MG/5ML PO SOLN: 5.0000 mg | Freq: Once | ORAL | Status: DC | PRN

## 2018-07-26 MED ORDER — ACETAMINOPHEN 325 MG PO TABS: 325.0000 mg | ORAL_TABLET | ORAL | Status: DC | PRN

## 2018-07-26 MED ORDER — PROMETHAZINE HCL 25 MG/ML IJ SOLN: 6.2500 mg | INTRAMUSCULAR | Status: DC | PRN

## 2018-07-26 MED ORDER — LIDOCAINE HCL (PF) 0.5 % IJ SOLN
INTRAMUSCULAR | Status: DC | PRN
  Administered 2018-07-26: 35 mL via INTRAVENOUS

## 2018-07-26 MED ORDER — POTASSIUM CHLORIDE IN NACL 20-0.9 MEQ/L-% IV SOLN: INTRAVENOUS | Status: DC

## 2018-07-26 MED ORDER — LIDOCAINE HCL (CARDIAC) PF 100 MG/5ML IV SOSY
PREFILLED_SYRINGE | INTRAVENOUS | Status: DC | PRN
  Administered 2018-07-26: 40 mg via INTRAVENOUS

## 2018-07-26 MED ORDER — ACETAMINOPHEN 160 MG/5ML PO SOLN: 325.0000 mg | ORAL | Status: DC | PRN

## 2018-07-26 MED ORDER — HYDROCODONE-ACETAMINOPHEN 5-325 MG PO TABS: 1.0000 | ORAL_TABLET | ORAL | Status: DC | PRN

## 2018-07-26 MED ORDER — FENTANYL CITRATE (PF) 100 MCG/2ML IJ SOLN: 25.0000 ug | INTRAMUSCULAR | Status: DC | PRN

## 2018-07-26 MED ORDER — CEFAZOLIN SODIUM-DEXTROSE 2-4 GM/100ML-% IV SOLN
2.0000 g | Freq: Once | INTRAVENOUS | Status: AC
  Administered 2018-07-26: 2 g via INTRAVENOUS

## 2018-07-26 MED ORDER — HYDROCODONE-ACETAMINOPHEN 5-325 MG PO TABS: 1.0000 | ORAL_TABLET | Freq: Four times a day (QID) | ORAL | 0 refills | Status: DC | PRN

## 2018-07-26 MED ORDER — ONDANSETRON HCL 4 MG/2ML IJ SOLN: 4.0000 mg | Freq: Four times a day (QID) | INTRAMUSCULAR | Status: DC | PRN

## 2018-07-26 MED ORDER — METOCLOPRAMIDE HCL 5 MG PO TABS: 5.0000 mg | ORAL_TABLET | Freq: Three times a day (TID) | ORAL | Status: DC | PRN

## 2018-07-26 MED ORDER — LACTATED RINGERS IV SOLN
10.0000 mL/h | INTRAVENOUS | Status: DC
  Administered 2018-07-26: 10 mL/h via INTRAVENOUS

## 2018-07-26 SURGICAL SUPPLY — 24 items
BANDAGE ELASTIC 2 LF NS (GAUZE/BANDAGES/DRESSINGS) ×2 IMPLANT
BNDG COHESIVE 4X5 TAN STRL (GAUZE/BANDAGES/DRESSINGS) ×2 IMPLANT
BNDG ESMARK 4X12 TAN STRL LF (GAUZE/BANDAGES/DRESSINGS) ×2 IMPLANT
CHLORAPREP W/TINT 26ML (MISCELLANEOUS) ×2 IMPLANT
CORD BIP STRL DISP 12FT (MISCELLANEOUS) ×2 IMPLANT
COVER LIGHT HANDLE UNIVERSAL (MISCELLANEOUS) ×4 IMPLANT
CUFF TOURNIQUET DUAL PORT 18X3 (MISCELLANEOUS) ×2 IMPLANT
DRAPE SURG 17X11 SM STRL (DRAPES) ×2 IMPLANT
GAUZE PETRO XEROFOAM 1X8 (MISCELLANEOUS) ×2 IMPLANT
GAUZE SPONGE 4X4 12PLY STRL (GAUZE/BANDAGES/DRESSINGS) ×2 IMPLANT
GLOVE BIO SURGEON STRL SZ8 (GLOVE) ×2 IMPLANT
GLOVE INDICATOR 8.0 STRL GRN (GLOVE) ×2 IMPLANT
GOWN STRL REUS W/ TWL LRG LVL3 (GOWN DISPOSABLE) ×1 IMPLANT
GOWN STRL REUS W/ TWL XL LVL3 (GOWN DISPOSABLE) ×1 IMPLANT
GOWN STRL REUS W/TWL LRG LVL3 (GOWN DISPOSABLE) ×1
GOWN STRL REUS W/TWL XL LVL3 (GOWN DISPOSABLE) ×1
KIT CARPAL TUNNEL (MISCELLANEOUS) ×1
KIT ESCP INSRT D SLOT CANN KN (MISCELLANEOUS) ×1 IMPLANT
KIT TURNOVER KIT A (KITS) ×2 IMPLANT
NS IRRIG 500ML POUR BTL (IV SOLUTION) ×2 IMPLANT
PACK EXTREMITY ARMC (MISCELLANEOUS) ×2 IMPLANT
SPLINT WRIST M RT TX990303 (SOFTGOODS) ×2 IMPLANT
STOCKINETTE IMPERVIOUS 9X36 MD (GAUZE/BANDAGES/DRESSINGS) ×2 IMPLANT
SUT PROLENE 4 0 PS 2 18 (SUTURE) ×2 IMPLANT

## 2018-07-26 NOTE — Op Note (Signed)
07/26/2018  12:00 PM  Patient:   Wanda Smith  Pre-Op Diagnosis:   Right carpal tunnel syndrome.  Post-Op Diagnosis:   Same.  Procedure:   Endoscopic right carpal tunnel release.  Surgeon:   Maryagnes AmosJ. Jeffrey Poggi, MD  Assistant:   Joni FearsJohanna Young, PA-S  Anesthesia:   Bier block  Findings:   As above.  Complications:   None  EBL:   0 cc  Fluids:   550 cc crystalloid  TT:   27 minutes at 250 mmHg  Drains:   None  Closure:   4-0 Prolene interrupted sutures  Brief Clinical Note:   The patient is a 57 year old female with a long history of gradually worsening right hand pain and paresthesias. Her symptoms have progressed despite medications, activity modifications, splinting, injections, etc. Her history and examination findings are consistent with carpal tunnel syndrome. The patient presents at this time for an endoscopic right carpal tunnel release.   Procedure:   The patient was brought into the operating room and lain in the supine position. After adequate IV sedation was achieved, a timeout was performed to verify the appropriate surgical site before a Bier block was placed by the anesthesiologist and the tourniquet inflated to 250 mmHg. The right hand and upper extremity were prepped with ChloraPrep solution before being draped sterilely. Preoperative antibiotics were administered. An approximately 1.5-2 cm incision was made over the volar wrist flexion crease, centered over the palmaris longus tendon. The incision was carried down through the subcutaneous tissues with care taken to identify and protect any neurovascular structures. The distal forearm fascia was penetrated just proximal to the transverse carpal ligament. The soft tissues were released off the superficial and deep surfaces of the distal forearm fascia and this was released proximally for 3-4 cm under direct visualization.  Attention was directed distally. The Therapist, nutritionalreer elevator was passed beneath the transverse carpal  ligament along the ulnar aspect of the carpal tunnel and used to release any adhesions as well as to remove any adherent synovial tissue before first the smaller then the larger of the two dilators were passed beneath the transverse carpal ligament along the ulnar margin of the carpal tunnel. The slotted cannula was introduced and the endoscope was placed into the slotted cannula and the undersurface of the transverse carpal ligament visualized. The distal margin of the transverse carpal ligament was marked by placing a 25-gauge needle percutaneously at Kaplan's cardinal point so that it entered the distal portion of the slotted cannula. Under endoscopic visualization, the transverse carpal ligament was released from proximal to distal using the end-cutting blade. A second pass was performed to ensure complete release of the ligament. The adequacy of release was verified both endoscopically and by palpation using the freer elevator.  The wound was irrigated thoroughly with sterile saline solution before being closed using 4-0 Prolene interrupted sutures. A total of 10 cc of 0.5% plain Sensorcaine was injected in and around the incision before a sterile bulky dressing was applied to the wound. The patient was placed into a volar wrist splint before being awakened and returned to the recovery room in satisfactory condition after tolerating the procedure well.

## 2018-07-26 NOTE — Anesthesia Procedure Notes (Signed)
Performed by: Anette Barra, CRNA Pre-anesthesia Checklist: Patient identified, Emergency Drugs available, Suction available, Timeout performed and Patient being monitored Patient Re-evaluated:Patient Re-evaluated prior to induction Oxygen Delivery Method: Simple face mask Placement Confirmation: positive ETCO2       

## 2018-07-26 NOTE — Discharge Instructions (Addendum)
General Anesthesia, Adult, Care After These instructions provide you with information about caring for yourself after your procedure. Your health care provider may also give you more specific instructions. Your treatment has been planned according to current medical practices, but problems sometimes occur. Call your health care provider if you have any problems or questions after your procedure. What can I expect after the procedure? After the procedure, it is common to have:  Vomiting.  A sore throat.  Mental slowness.  It is common to feel:  Nauseous.  Cold or shivery.  Sleepy.  Tired.  Sore or achy, even in parts of your body where you did not have surgery.  Follow these instructions at home: For at least 24 hours after the procedure:  Do not: ? Participate in activities where you could fall or become injured. ? Drive. ? Use heavy machinery. ? Drink alcohol. ? Take sleeping pills or medicines that cause drowsiness. ? Make important decisions or sign legal documents. ? Take care of children on your own.  Rest. Eating and drinking  If you vomit, drink water, juice, or soup when you can drink without vomiting.  Drink enough fluid to keep your urine clear or pale yellow.  Make sure you have little or no nausea before eating solid foods.  Follow the diet recommended by your health care provider. General instructions  Have a responsible adult stay with you until you are awake and alert.  Return to your normal activities as told by your health care provider. Ask your health care provider what activities are safe for you.  Take over-the-counter and prescription medicines only as told by your health care provider.  If you smoke, do not smoke without supervision.  Keep all follow-up visits as told by your health care provider. This is important. Contact a health care provider if:  You continue to have nausea or vomiting at home, and medicines are not helpful.  You  cannot drink fluids or start eating again.  You cannot urinate after 8-12 hours.  You develop a skin rash.  You have fever.  You have increasing redness at the site of your procedure. Get help right away if:  You have difficulty breathing.  You have chest pain.  You have unexpected bleeding.  You feel that you are having a life-threatening or urgent problem. This information is not intended to replace advice given to you by your health care provider. Make sure you discuss any questions you have with your health care provider. Document Released: 11/29/2000 Document Revised: 01/26/2016 Document Reviewed: 08/07/2015 Elsevier Interactive Patient Education  2018 [employer]   Orthopedic discharge instructions: Keep dressing dry and intact. Keep hand elevated above heart level. May shower after dressing removed on postop day 4 (Sunday). Cover sutures with Band-Aids after drying off. Apply ice to affected area frequently. Take Mobic 7.5 mg daily OR ibuprofen 600-800 mg TID with meals for 7-10 days, then as necessary. Take ES Tylenol or pain medication as prescribed when needed.  Return for follow-up in 10-14 days or as scheduled.

## 2018-07-26 NOTE — Anesthesia Postprocedure Evaluation (Signed)
Anesthesia Post Note  Patient: Wanda Smith  Procedure(s) Performed: CARPAL TUNNEL RELEASE ENDOSCOPIC (Right Hand)  Patient location during evaluation: PACU Anesthesia Type: Bier Block and Regional Level of consciousness: awake and alert Pain management: pain level controlled Vital Signs Assessment: post-procedure vital signs reviewed and stable Respiratory status: spontaneous breathing, nonlabored ventilation, respiratory function stable and patient connected to nasal cannula oxygen Cardiovascular status: blood pressure returned to baseline and stable Postop Assessment: no apparent nausea or vomiting Anesthetic complications: no    Myha Arizpe

## 2018-07-26 NOTE — Anesthesia Preprocedure Evaluation (Addendum)
Anesthesia Evaluation  Patient identified by MRN, date of birth, ID band  Reviewed: NPO status   History of Anesthesia Complications Negative for: history of anesthetic complications  Airway Mallampati: II  TM Distance: >3 FB Neck ROM: full    Dental no notable dental hx.    Pulmonary asthma , former smoker,    Pulmonary exam normal        Cardiovascular Exercise Tolerance: Good hypertension, Normal cardiovascular exam     Neuro/Psych PSYCHIATRIC DISORDERS Anxiety Depression negative neurological ROS     GI/Hepatic negative GI ROS, Neg liver ROS,   Endo/Other  diabetesHypothyroidism   Renal/GU negative Renal ROS  negative genitourinary   Musculoskeletal  (+) Arthritis ,   Abdominal   Peds  Hematology negative hematology ROS (+)   Anesthesia Other Findings   Reproductive/Obstetrics                             Anesthesia Physical Anesthesia Plan  ASA: II  Anesthesia Plan: Bier Block and Regional and Bier Block-Lidocaine Only   Post-op Pain Management:    Induction:   PONV Risk Score and Plan:   Airway Management Planned: Natural Airway  Additional Equipment:   Intra-op Plan:   Post-operative Plan:   Informed Consent: I have reviewed the patients History and Physical, chart, labs and discussed the procedure including the risks, benefits and alternatives for the proposed anesthesia with the patient or authorized representative who has indicated his/her understanding and acceptance.     Plan Discussed with: CRNA  Anesthesia Plan Comments:        Anesthesia Quick Evaluation

## 2018-07-26 NOTE — H&P (Signed)
Paper H&P to be scanned into permanent record. H&P reviewed and patient re-examined. No changes. 

## 2018-07-26 NOTE — Anesthesia Procedure Notes (Signed)
Anesthesia Regional Block: Bier block (IV Regional)   Pre-Anesthetic Checklist: ,, timeout performed, Correct Patient, Correct Site, Correct Laterality, Correct Procedure, Correct Position, site marked, Risks and benefits discussed,  Surgical consent,  Pre-op evaluation,  At surgeon's request and post-op pain management  Laterality: Right      Procedures:,,,,, intact distal pulses, Esmarch exsanguination,, #20gu IV placed and double tourniquet utilized  Narrative:  Start time: 07/26/2018 11:29 AM End time: 07/26/2018 11:30 AM  Performed by: With CRNAs  Anesthesiologist: Orrin BrighamMaji, Debabrata, MD CRNA: Jimmy PicketAmyot, Anvika Gashi, CRNA  Additional Notes: Double tourniquet applied. Esmarch exsanguination without difficulty. Proximal and distal cuffs inflated. Distal cuff deflated. Negative R radial pulse noted. 35mL 0.5% Lidocaine plain injected through R hand IV. Tolerated well by pt.

## 2018-07-26 NOTE — Transfer of Care (Signed)
Immediate Anesthesia Transfer of Care Note  Patient: Wanda Smith  Procedure(s) Performed: CARPAL TUNNEL RELEASE ENDOSCOPIC (Right Hand)  Patient Location: PACU  Anesthesia Type: General, Bier Block  Level of Consciousness: awake, alert  and patient cooperative  Airway and Oxygen Therapy: Patient Spontanous Breathing and Patient connected to supplemental oxygen  Post-op Assessment: Post-op Vital signs reviewed, Patient's Cardiovascular Status Stable, Respiratory Function Stable, Patent Airway and No signs of Nausea or vomiting  Post-op Vital Signs: Reviewed and stable  Complications: No apparent anesthesia complications

## 2018-07-27 ENCOUNTER — Encounter: Payer: Self-pay | Admitting: Surgery

## 2018-07-27 DIAGNOSIS — J069 Acute upper respiratory infection, unspecified: Secondary | ICD-10-CM | POA: Diagnosis not present

## 2018-07-27 DIAGNOSIS — J0110 Acute frontal sinusitis, unspecified: Secondary | ICD-10-CM | POA: Diagnosis not present

## 2018-09-06 ENCOUNTER — Encounter: Payer: Self-pay | Admitting: Emergency Medicine

## 2018-09-06 ENCOUNTER — Other Ambulatory Visit: Payer: Self-pay

## 2018-09-06 ENCOUNTER — Ambulatory Visit
Admission: EM | Admit: 2018-09-06 | Discharge: 2018-09-06 | Disposition: A | Discharge status: Home / Self Care | Payer: 59 | Attending: Family Medicine | Admitting: Family Medicine

## 2018-09-06 DIAGNOSIS — Z87891 Personal history of nicotine dependence: Secondary | ICD-10-CM

## 2018-09-06 DIAGNOSIS — R05 Cough: Secondary | ICD-10-CM | POA: Diagnosis not present

## 2018-09-06 DIAGNOSIS — B9789 Other viral agents as the cause of diseases classified elsewhere: Secondary | ICD-10-CM

## 2018-09-06 DIAGNOSIS — J069 Acute upper respiratory infection, unspecified: Secondary | ICD-10-CM | POA: Diagnosis not present

## 2018-09-06 DIAGNOSIS — J029 Acute pharyngitis, unspecified: Secondary | ICD-10-CM | POA: Diagnosis not present

## 2018-09-06 DIAGNOSIS — R059 Cough, unspecified: Secondary | ICD-10-CM

## 2018-09-06 LAB — RAPID INFLUENZA A&B ANTIGENS (ARMC ONLY)
INFLUENZA A (ARMC): NEGATIVE
INFLUENZA B (ARMC): NEGATIVE

## 2018-09-06 LAB — RAPID STREP SCREEN (MED CTR MEBANE ONLY): Streptococcus, Group A Screen (Direct): NEGATIVE

## 2018-09-06 MED ORDER — GUAIFENESIN-CODEINE 100-10 MG/5ML PO SYRP: 5.0000 mL | ORAL_SOLUTION | Freq: Four times a day (QID) | ORAL | 0 refills | Status: AC | PRN

## 2018-09-06 MED ORDER — LIDOCAINE VISCOUS HCL 2 % MT SOLN: 15.0000 mL | OROMUCOSAL | 0 refills | Status: AC | PRN

## 2018-09-06 NOTE — ED Triage Notes (Signed)
Patient in today c/o cough, sore throat, diarrhea and muscle spasms x 2 days. Patient denies fever. Patient has been taking Tylenol.

## 2018-09-06 NOTE — Discharge Instructions (Signed)
URI/COLD SYMPTOMS: Your exam today is consistent with a viral illness. Antibiotics are not indicated at this time. Use medications as directed, including cough syrup, nasal saline, and decongestants. Your symptoms should improve over the next few days and resolve within 7-10 days. Increase rest and fluids. F/u if symptoms worsen or predominate such as sore throat, ear pain, productive cough, shortness of breath, or if you develop high fevers or worsening fatigue over the next several days.    

## 2018-09-06 NOTE — ED Provider Notes (Signed)
[facility]    CSN: [id number] Arrival date & time: 09/06/18  1056     History   Chief Complaint Chief Complaint  Patient presents with  . Cough    APPT  . Sore Throat    HPI Wanda Smith is a 58 y.o. female. Patient presents for 2-3 day history of cough, sore throat, body aches, chills and sweats. Denies known fever, but is very fatigued. She has tried OTC meds w/o relief.  HPI  Past Medical History:  Diagnosis Date  . Anxiety   . Arthritis    knee (R)  . Asthma   . Depression   . Diabetes mellitus without complication (HCC)   . Dyspnea   . Hypertension   . Hypothyroidism   . Increased serum lipids   . Wears contact lenses     There are no active problems to display for this patient.   Past Surgical History:  Procedure Laterality Date  . ABDOMINAL HYSTERECTOMY    . CARPAL TUNNEL RELEASE Right 07/26/2018   Procedure: CARPAL TUNNEL RELEASE ENDOSCOPIC;  Surgeon: Christena Flake, MD;  Location: [facility];  Service: Orthopedics;  Laterality: Right;  diabetic - oral meds  . CHOLECYSTECTOMY    . COLONOSCOPY WITH ESOPHAGOGASTRODUODENOSCOPY (EGD)    . COLONOSCOPY WITH PROPOFOL N/A 01/13/2018   Procedure: COLONOSCOPY WITH PROPOFOL;  Surgeon: Scot Jun, MD;  Location: [facility];  Service: Endoscopy;  Laterality: N/A;  . HERNIA REPAIR    . left rotator cuff repair Left   . TUBAL LIGATION      OB History   No obstetric history on file.      Home Medications    Prior to Admission medications   Medication Sig Start Date End Date Taking? Authorizing Provider  albuterol (PROVENTIL HFA;VENTOLIN HFA) 108 (90 Base) MCG/ACT inhaler Inhale into the lungs every 6 (six) hours as needed for wheezing or shortness of breath.   Yes [provider]  azelastine (ASTELIN) 0.1 % nasal spray Place into both nostrils 2 (two) times daily as needed for rhinitis. Use in each nostril as directed   Yes [provider]  citalopram  (CELEXA) 20 MG tablet Take 20 mg by mouth daily. 02/13/18  Yes [provider]  fluticasone (FLONASE) 50 MCG/ACT nasal spray Place 2 sprays into both nostrils daily as needed.    Yes [provider]  Fluticasone-Salmeterol (ADVAIR) 250-50 MCG/DOSE AEPB Inhale 1 puff into the lungs 2 (two) times daily.   Yes [provider]  levothyroxine (SYNTHROID, LEVOTHROID) 75 MCG tablet Take 75 mcg by mouth daily before breakfast.   Yes [provider]  lisinopril (PRINIVIL,ZESTRIL) 40 MG tablet Take 40 mg by mouth daily.   Yes [provider]  meloxicam (MOBIC) 7.5 MG tablet Take 7.5 mg by mouth daily.   Yes [provider]  metFORMIN (GLUCOPHAGE) 1000 MG tablet Take 1,000 mg by mouth 2 (two) times daily with a meal.   Yes [provider]  montelukast (SINGULAIR) 10 MG tablet Take 10 mg by mouth at bedtime.   Yes [provider]  rosuvastatin (CRESTOR) 10 MG tablet Take 10 mg by mouth daily.   Yes [provider]  cycloSPORINE (RESTASIS) 0.05 % ophthalmic emulsion Place 1 drop into both eyes 2 (two) times daily.    [provider]  guaiFENesin-codeine (ROBITUSSIN AC) 100-10 MG/5ML syrup Take 5 mLs by mouth 4 (four) times daily as needed for up to 7 days for cough. 09/06/18  09/13/18  Shirlee LatchEaves, Teila Skalsky B, PA-C  HYDROcodone-acetaminophen (NORCO/VICODIN) 5-325 MG tablet Take 1-2 tablets by mouth every 6 (six) hours as needed for moderate pain. 07/26/18   Poggi, Excell SeltzerJohn J, MD  lidocaine (XYLOCAINE) 2 % solution Use as directed 15 mLs in the mouth or throat every 3 (three) hours as needed for up to 3 days for mouth pain. 09/06/18 09/09/18  Shirlee LatchEaves, Joda Braatz B, PA-C    Family History Family History  Problem Relation Age of Onset  . Breast cancer Mother 2764  . Breast cancer Sister 5952  . Diabetes Sister   . Diabetes Father   . Diabetes Brother     Social History Social History   Tobacco Use  . Smoking status: Former Smoker    Packs/day:  0.75    Years: 20.00    Pack years: 15.00    Last attempt to quit: 09/07/2007    Years since quitting: 11.0  . Smokeless tobacco: Never Used  Substance Use Topics  . Alcohol use: Yes    Alcohol/week: 2.0 standard drinks    Types: 2 Standard drinks or equivalent per week    Frequency: Never    Comment: occasionally (4x/yr)  . Drug use: Never     Allergies   Pravastatin and Tramadol   Review of Systems Review of Systems  Constitutional: Positive for chills and fatigue. Negative for fever.  HENT: Positive for congestion, postnasal drip, rhinorrhea and sore throat. Negative for ear pain, sinus pressure and sinus pain.   Eyes: Negative for discharge and redness.  Respiratory: Positive for cough. Negative for shortness of breath.   Cardiovascular: Negative for chest pain.  Gastrointestinal: Negative for abdominal pain, nausea and vomiting.  Musculoskeletal: Positive for arthralgias and myalgias.  Skin: Negative for color change and rash.  Allergic/Immunologic: Negative for environmental allergies.  Neurological: Negative for dizziness, weakness and headaches.     Physical Exam Triage Vital Signs ED Triage Vitals  Enc Vitals Group     BP 09/06/18 1137 (!) 147/95     Pulse Rate 09/06/18 1137 82     Resp 09/06/18 1137 16     Temp 09/06/18 1137 97.9 F (36.6 C)     Temp Source 09/06/18 1137 Oral     SpO2 09/06/18 1137 100 %     Weight 09/06/18 1138 148 lb (67.1 kg)     Height 09/06/18 1138 5\' 4"  (1.626 m)     Head Circumference --      Peak Flow --      Pain Score 09/06/18 1138 6     Pain Loc --      Pain Edu? --      Excl. in GC? --    No data found.  Updated Vital Signs BP (!) 147/95 (BP Location: Left Arm)   Pulse 82   Temp 97.9 F (36.6 C) (Oral)   Resp 16   Ht 5\' 4"  (1.626 m)   Wt 148 lb (67.1 kg)   SpO2 100%   BMI 25.40 kg/m       Physical Exam Vitals signs and nursing note reviewed.  Constitutional:      General: She is not in acute distress.     Appearance: She is well-developed and normal weight. She is not ill-appearing or toxic-appearing.  HENT:     Head: Normocephalic and atraumatic.     Right Ear: Tympanic membrane and ear canal normal. Tympanic membrane is not erythematous.     Left Ear: Tympanic membrane and ear canal  normal. Tympanic membrane is not erythematous.     Nose: Mucosal edema and rhinorrhea (moderate clear rhinorrhea) present.     Mouth/Throat:     Mouth: Mucous membranes are moist.     Pharynx: Posterior oropharyngeal erythema (mild erythema, no tonsillar exudates or swelling) present.     Tonsils: No tonsillar exudate.  Neck:     Musculoskeletal: Neck supple.  Cardiovascular:     Rate and Rhythm: Normal rate and regular rhythm.     Heart sounds: Normal heart sounds. No murmur.  Pulmonary:     Effort: Pulmonary effort is normal.     Breath sounds: Normal breath sounds. No wheezing, rhonchi or rales.  Lymphadenopathy:     Cervical: Cervical adenopathy present.  Skin:    General: Skin is warm and dry.     Findings: No erythema.  Neurological:     General: No focal deficit present.     Mental Status: She is alert and oriented to person, place, and time.  Psychiatric:        Mood and Affect: Mood normal.        Behavior: Behavior normal.      UC Treatments / Results  Labs (all labs ordered are listed, but only abnormal results are displayed) Labs Reviewed  RAPID STREP SCREEN ([facility] ONLY)  RAPID INFLUENZA A&B ANTIGENS ([facility] ONLY)  CULTURE, GROUP A STREP [facility])    EKG None  Radiology No results found.  Procedures Procedures (including critical care time)  Medications Ordered in UC Medications - No data to display  Initial Impression / Assessment and Plan / UC Course  I have reviewed the triage vital signs and the nursing notes.  Pertinent labs & imaging results that were available during my care of the patient were reviewed by me and considered in my medical decision making (see  chart for details).     Final Clinical Impressions(s) / UC Diagnoses   Final diagnoses:  Viral upper respiratory tract infection  Pharyngitis, unspecified etiology  Cough     Discharge Instructions     URI/COLD SYMPTOMS: Your exam today is consistent with a viral illness. Antibiotics are not indicated at this time. Use medications as directed, including cough syrup, nasal saline, and decongestants. Your symptoms should improve over the next few days and resolve within 7-10 days. Increase rest and fluids. F/u if symptoms worsen or predominate such as sore throat, ear pain, productive cough, shortness of breath, or if you develop high fevers or worsening fatigue over the next several days.     ED Prescriptions    Medication Sig Dispense Auth. Provider   guaiFENesin-codeine (ROBITUSSIN AC) 100-10 MG/5ML syrup Take 5 mLs by mouth 4 (four) times daily as needed for up to 7 days for cough. 125 mL Eusebio Friendly B, PA-C   lidocaine (XYLOCAINE) 2 % solution Use as directed 15 mLs in the mouth or throat every 3 (three) hours as needed for up to 3 days for mouth pain. 150 mL Eusebio Friendly B, PA-C     Controlled Substance Prescriptions [place] Controlled Substance Registry consulted? Yes, I have consulted the [place] Controlled Substances Registry for this patient, and feel the risk/benefit ratio today is favorable for proceeding with this prescription for a controlled substance.   Shirlee Latch, PA-C 09/07/18 2038

## 2018-09-09 LAB — CULTURE, GROUP A STREP (THRC)

## 2018-09-21 DIAGNOSIS — E039 Hypothyroidism, unspecified: Secondary | ICD-10-CM | POA: Diagnosis not present

## 2018-09-28 ENCOUNTER — Other Ambulatory Visit: Payer: Self-pay | Admitting: Physician Assistant

## 2018-09-28 DIAGNOSIS — Z0000 Encounter for general adult medical examination without abnormal findings: Secondary | ICD-10-CM | POA: Diagnosis not present

## 2018-09-28 DIAGNOSIS — Z1231 Encounter for screening mammogram for malignant neoplasm of breast: Secondary | ICD-10-CM

## 2018-09-28 DIAGNOSIS — E039 Hypothyroidism, unspecified: Secondary | ICD-10-CM | POA: Diagnosis not present

## 2018-09-28 DIAGNOSIS — I10 Essential (primary) hypertension: Secondary | ICD-10-CM | POA: Diagnosis not present

## 2018-10-24 ENCOUNTER — Ambulatory Visit
Admission: RE | Admit: 2018-10-24 | Discharge: 2018-10-24 | Disposition: A | Discharge status: Home / Self Care | Payer: 59 | Source: Ambulatory Visit | Attending: Physician Assistant | Admitting: Physician Assistant

## 2018-10-24 DIAGNOSIS — Z1231 Encounter for screening mammogram for malignant neoplasm of breast: Secondary | ICD-10-CM | POA: Diagnosis not present

## 2018-10-25 DIAGNOSIS — J4520 Mild intermittent asthma, uncomplicated: Secondary | ICD-10-CM | POA: Diagnosis not present

## 2018-10-25 DIAGNOSIS — R6889 Other general symptoms and signs: Secondary | ICD-10-CM | POA: Diagnosis not present

## 2018-10-25 DIAGNOSIS — J019 Acute sinusitis, unspecified: Secondary | ICD-10-CM | POA: Diagnosis not present

## 2018-11-23 DIAGNOSIS — E119 Type 2 diabetes mellitus without complications: Secondary | ICD-10-CM | POA: Diagnosis not present

## 2018-11-30 ENCOUNTER — Encounter: Payer: Self-pay | Admitting: *Deleted

## 2018-12-11 DIAGNOSIS — J019 Acute sinusitis, unspecified: Secondary | ICD-10-CM | POA: Diagnosis not present

## 2019-10-03 ENCOUNTER — Other Ambulatory Visit: Payer: Self-pay | Admitting: Physician Assistant

## 2019-10-03 DIAGNOSIS — Z1231 Encounter for screening mammogram for malignant neoplasm of breast: Secondary | ICD-10-CM

## 2019-10-29 IMAGING — MG MM DIGITAL SCREENING BILAT W/ CAD
4 series · 4 of 4 positions shown · non-contrast
Comparison: Previous exam(s).

CLINICAL DATA: Screening.

EXAM:
DIGITAL SCREENING BILATERAL MAMMOGRAM WITH CAD

[L CC]
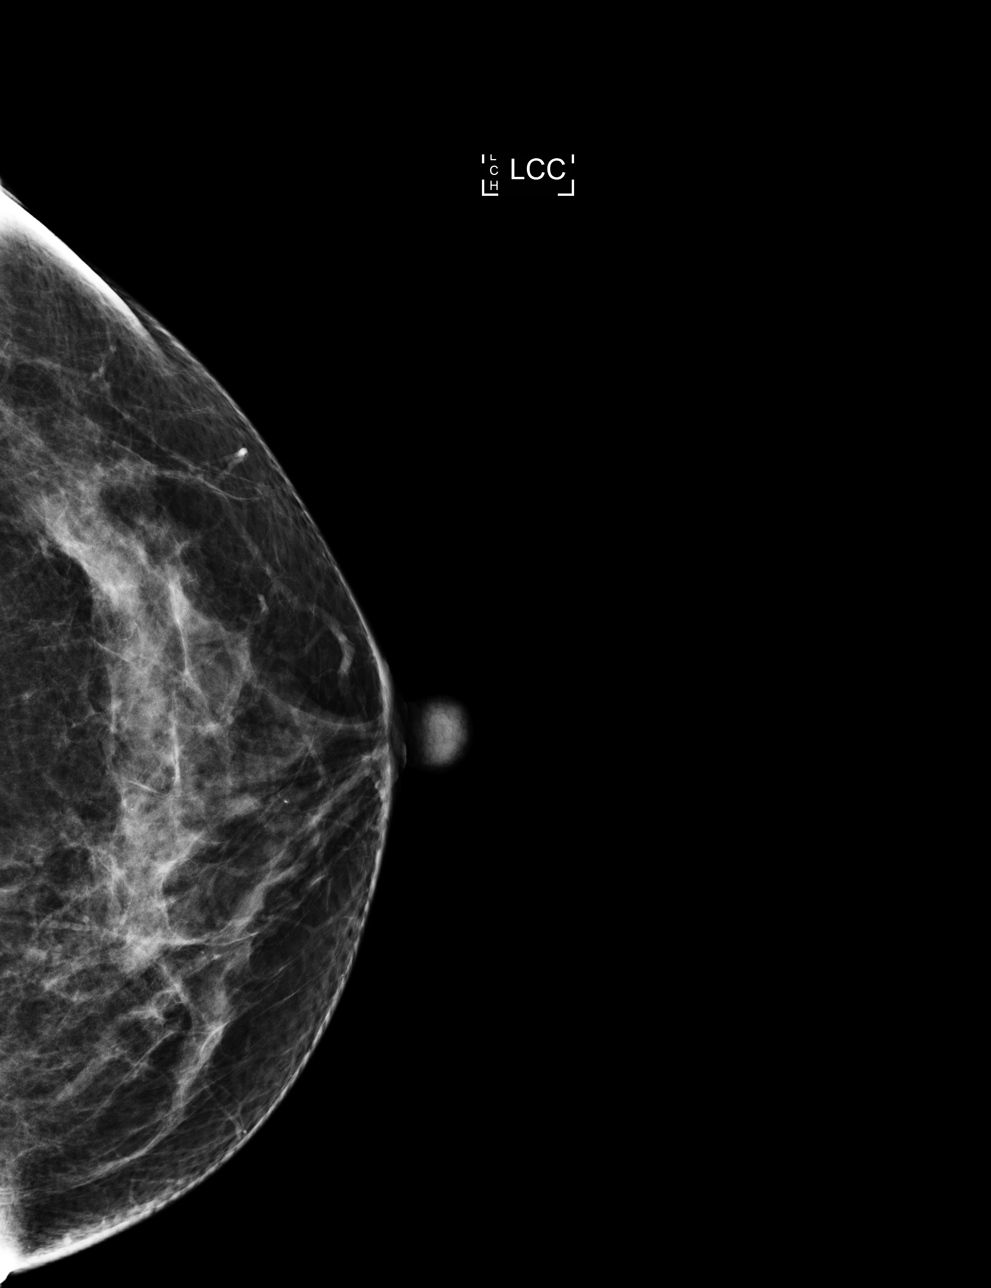

[R MLO]
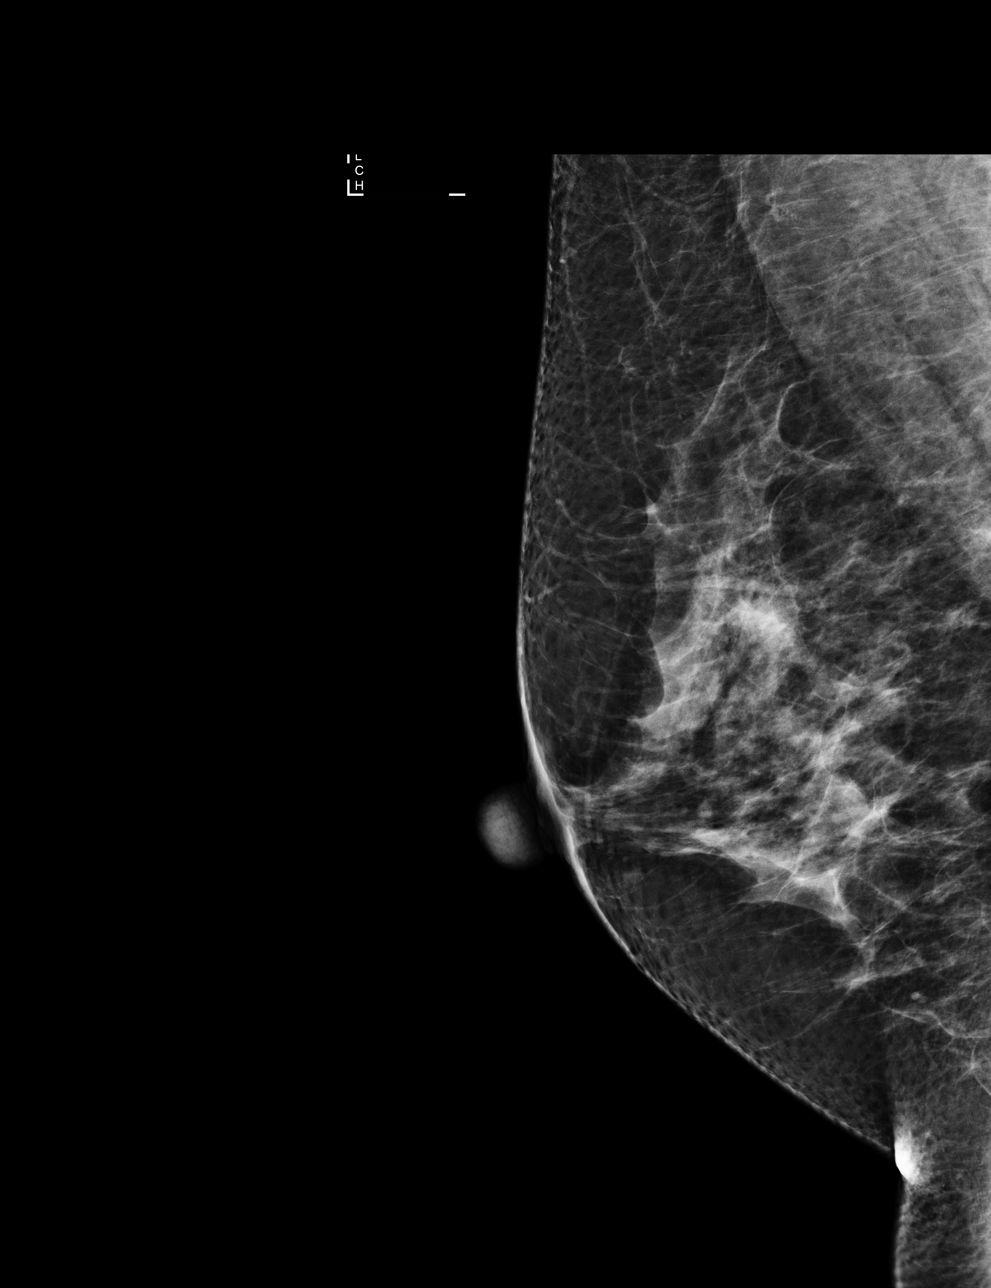

[L MLO]
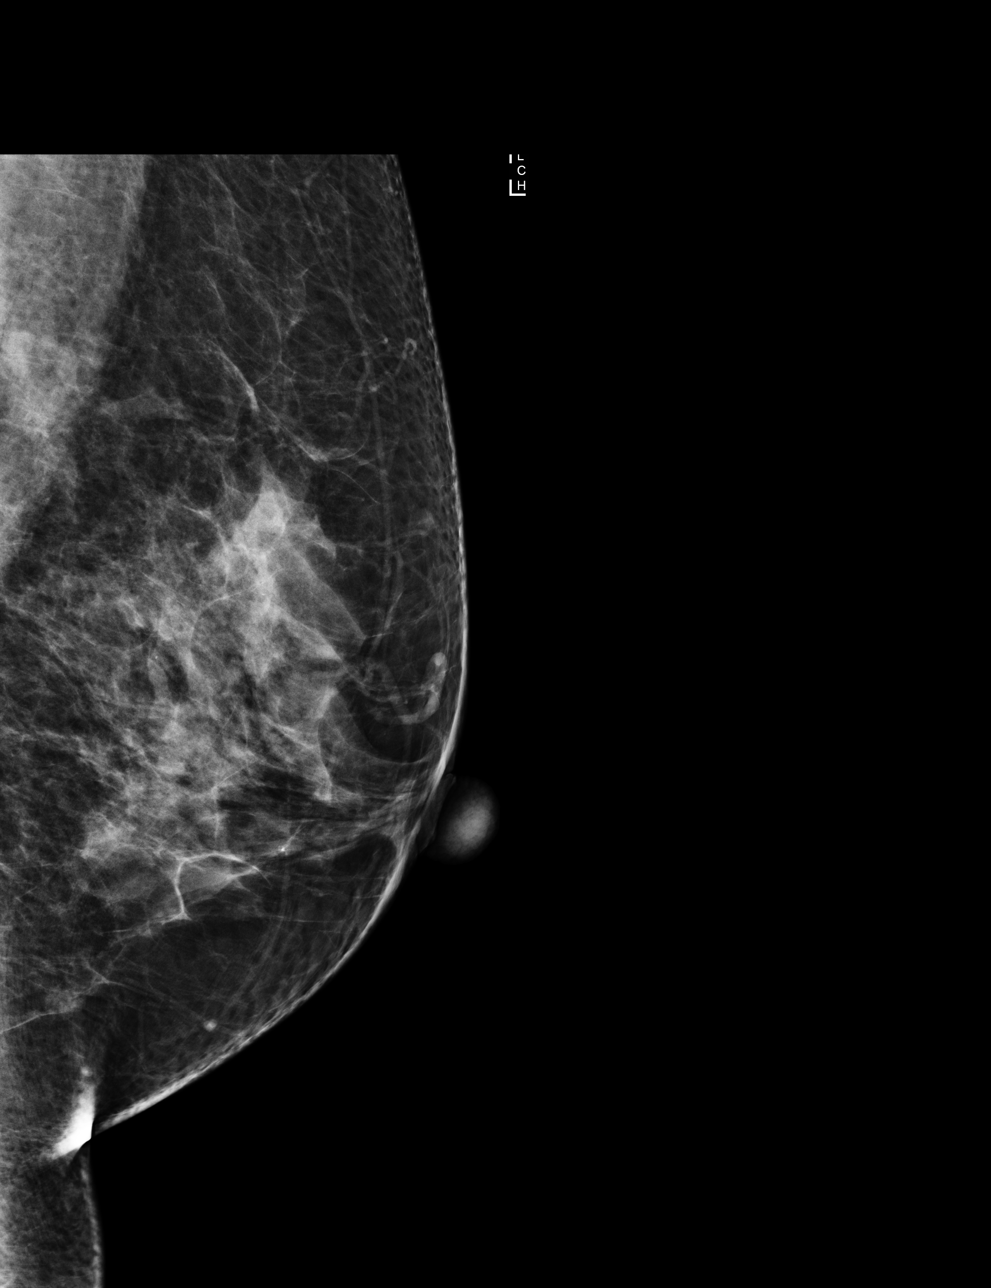

[R CC]
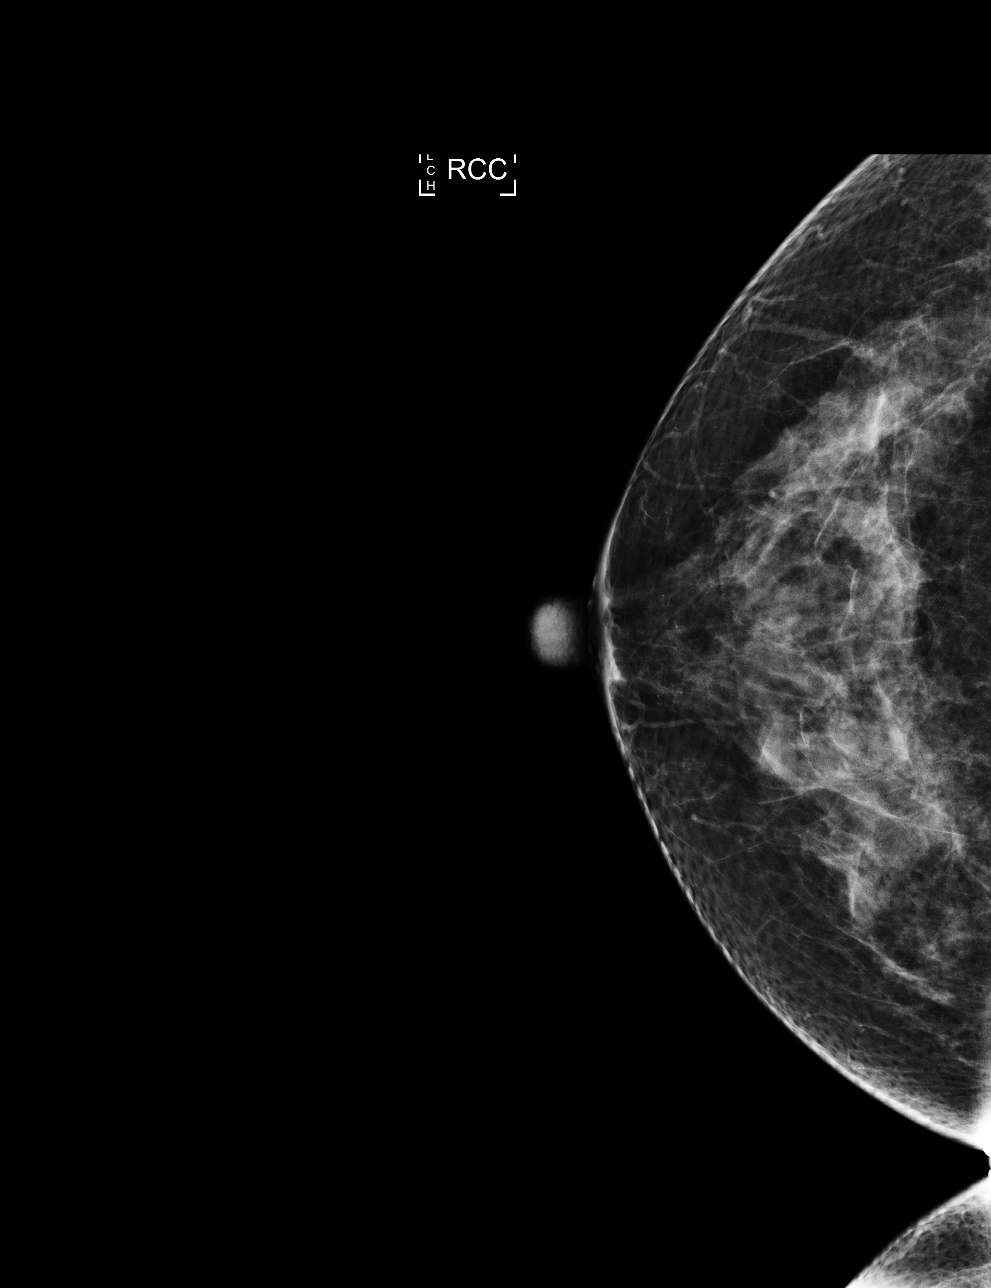

[4 of 4 positions shown; findings below may reference images not displayed]

ACR Breast Density Category c: The breast tissue is heterogeneously
dense, which may obscure small masses.
FINDINGS: There are no findings suspicious for malignancy. Images were
processed with CAD.
IMPRESSION: No mammographic evidence of malignancy. A result letter of this
screening mammogram will be mailed directly to the patient.

RECOMMENDATION:
Screening mammogram in one year. (Code:YJ-[id number])

BI-RADS CATEGORY  1: Negative.

## 2019-10-31 ENCOUNTER — Ambulatory Visit
Admission: RE | Admit: 2019-10-31 | Discharge: 2019-10-31 | Disposition: A | Discharge status: Home / Self Care | Payer: 59 | Source: Ambulatory Visit | Attending: Physician Assistant | Admitting: Physician Assistant

## 2019-10-31 DIAGNOSIS — Z1231 Encounter for screening mammogram for malignant neoplasm of breast: Secondary | ICD-10-CM | POA: Insufficient documentation

## 2019-11-02 ENCOUNTER — Other Ambulatory Visit: Payer: Self-pay | Admitting: Physician Assistant

## 2019-11-02 DIAGNOSIS — N6310 Unspecified lump in the right breast, unspecified quadrant: Secondary | ICD-10-CM

## 2019-11-02 DIAGNOSIS — R928 Other abnormal and inconclusive findings on diagnostic imaging of breast: Secondary | ICD-10-CM

## 2019-11-09 ENCOUNTER — Other Ambulatory Visit: Payer: Self-pay

## 2019-11-09 ENCOUNTER — Ambulatory Visit
Admission: RE | Admit: 2019-11-09 | Discharge: 2019-11-09 | Disposition: A | Discharge status: Home / Self Care | Payer: 59 | Source: Ambulatory Visit | Attending: Physician Assistant | Admitting: Physician Assistant

## 2019-11-09 DIAGNOSIS — R928 Other abnormal and inconclusive findings on diagnostic imaging of breast: Secondary | ICD-10-CM

## 2019-11-09 DIAGNOSIS — N6310 Unspecified lump in the right breast, unspecified quadrant: Secondary | ICD-10-CM | POA: Insufficient documentation

## 2019-11-13 ENCOUNTER — Other Ambulatory Visit: Payer: Self-pay | Admitting: Physician Assistant

## 2019-11-13 DIAGNOSIS — N6310 Unspecified lump in the right breast, unspecified quadrant: Secondary | ICD-10-CM

## 2019-11-16 ENCOUNTER — Ambulatory Visit: Payer: 59

## 2019-11-16 ENCOUNTER — Other Ambulatory Visit: Payer: 59

## 2020-04-18 ENCOUNTER — Other Ambulatory Visit: Payer: Self-pay | Admitting: Nephrology

## 2020-04-18 DIAGNOSIS — N1832 Chronic kidney disease, stage 3b: Secondary | ICD-10-CM

## 2020-04-18 DIAGNOSIS — E1122 Type 2 diabetes mellitus with diabetic chronic kidney disease: Secondary | ICD-10-CM

## 2020-04-28 ENCOUNTER — Ambulatory Visit
Admission: RE | Admit: 2020-04-28 | Discharge: 2020-04-28 | Disposition: A | Discharge status: Home / Self Care | Payer: 59 | Source: Ambulatory Visit | Attending: Nephrology | Admitting: Nephrology

## 2020-04-28 ENCOUNTER — Other Ambulatory Visit: Payer: Self-pay

## 2020-04-28 DIAGNOSIS — E1122 Type 2 diabetes mellitus with diabetic chronic kidney disease: Secondary | ICD-10-CM | POA: Diagnosis not present

## 2020-04-28 DIAGNOSIS — N1832 Chronic kidney disease, stage 3b: Secondary | ICD-10-CM

## 2020-05-13 ENCOUNTER — Inpatient Hospital Stay: Payer: 59 | Attending: Oncology | Admitting: Oncology

## 2020-05-13 ENCOUNTER — Inpatient Hospital Stay: Payer: 59

## 2020-05-13 ENCOUNTER — Encounter: Payer: Self-pay | Admitting: Oncology

## 2020-05-13 ENCOUNTER — Other Ambulatory Visit: Payer: Self-pay

## 2020-05-13 VITALS — BP 100/63 | HR 65 | Temp 97.0°F | Resp 18 | Wt 136.3 lb

## 2020-05-13 DIAGNOSIS — D801 Nonfamilial hypogammaglobulinemia: Secondary | ICD-10-CM

## 2020-05-13 DIAGNOSIS — E1122 Type 2 diabetes mellitus with diabetic chronic kidney disease: Secondary | ICD-10-CM | POA: Diagnosis not present

## 2020-05-13 DIAGNOSIS — E039 Hypothyroidism, unspecified: Secondary | ICD-10-CM | POA: Insufficient documentation

## 2020-05-13 DIAGNOSIS — R5381 Other malaise: Secondary | ICD-10-CM | POA: Diagnosis not present

## 2020-05-13 DIAGNOSIS — Z79899 Other long term (current) drug therapy: Secondary | ICD-10-CM | POA: Diagnosis not present

## 2020-05-13 DIAGNOSIS — Z87891 Personal history of nicotine dependence: Secondary | ICD-10-CM | POA: Insufficient documentation

## 2020-05-13 DIAGNOSIS — N189 Chronic kidney disease, unspecified: Secondary | ICD-10-CM | POA: Insufficient documentation

## 2020-05-13 DIAGNOSIS — E0500 Thyrotoxicosis with diffuse goiter without thyrotoxic crisis or storm: Secondary | ICD-10-CM | POA: Diagnosis not present

## 2020-05-13 DIAGNOSIS — K760 Fatty (change of) liver, not elsewhere classified: Secondary | ICD-10-CM | POA: Insufficient documentation

## 2020-05-13 DIAGNOSIS — R5383 Other fatigue: Secondary | ICD-10-CM | POA: Diagnosis not present

## 2020-05-13 DIAGNOSIS — I129 Hypertensive chronic kidney disease with stage 1 through stage 4 chronic kidney disease, or unspecified chronic kidney disease: Secondary | ICD-10-CM | POA: Insufficient documentation

## 2020-05-13 DIAGNOSIS — R778 Other specified abnormalities of plasma proteins: Secondary | ICD-10-CM | POA: Diagnosis not present

## 2020-05-13 DIAGNOSIS — M1990 Unspecified osteoarthritis, unspecified site: Secondary | ICD-10-CM | POA: Diagnosis not present

## 2020-05-13 DIAGNOSIS — R779 Abnormality of plasma protein, unspecified: Secondary | ICD-10-CM | POA: Insufficient documentation

## 2020-05-13 LAB — CBC WITH DIFFERENTIAL/PLATELET
Abs Immature Granulocytes: 0.01 10*3/uL (ref 0.00–0.07)
Basophils Absolute: 0 10*3/uL (ref 0.0–0.1)
Basophils Relative: 1 %
Eosinophils Absolute: 0.2 10*3/uL (ref 0.0–0.5)
Eosinophils Relative: 3 %
HCT: 38 % (ref 36.0–46.0)
Hemoglobin: 12.7 g/dL (ref 12.0–15.0)
Immature Granulocytes: 0 %
Lymphocytes Relative: 38 %
Lymphs Abs: 2.3 10*3/uL (ref 0.7–4.0)
MCH: 31.1 pg (ref 26.0–34.0)
MCHC: 33.4 g/dL (ref 30.0–36.0)
MCV: 93.1 fL (ref 80.0–100.0)
Monocytes Absolute: 0.5 10*3/uL (ref 0.1–1.0)
Monocytes Relative: 8 %
Neutro Abs: 3 10*3/uL (ref 1.7–7.7)
Neutrophils Relative %: 50 %
Platelets: 282 10*3/uL (ref 150–400)
RBC: 4.08 MIL/uL (ref 3.87–5.11)
RDW: 12.5 % (ref 11.5–15.5)
WBC: 6 10*3/uL (ref 4.0–10.5)
nRBC: 0 % (ref 0.0–0.2)

## 2020-05-13 NOTE — Progress Notes (Signed)
Hematology/Oncology Consult note [facility] Telephone:([phone] [phone]  Patient Care Team: Marinda Elk, MD as PCP - General (Physician Assistant)   Name of the patient: Wanda Smith  [id number]  1961/01/07    Reason for referral-abnormal SPEP   Referring physician-Dr. Holley Raring  Date of visit: 05/13/20   History of presenting illness- Patient is a 59 year old female with history of type 2 diabetes, hypertension, fatty liver disease, hypothyroidism from Graves' disease who was seen by Dr. Holley Raring for stage III CKD as a part of her work-up she had SPEP done which showed no abnormal M protein but decreased gammaglobulin of 0.7%.  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  Her CBC showed a normal H&H of 13.3/29.9.  Serum creatinine of 1.05.  Calcium levels normal at 9.9.  She was also found to have a positive ANA for which she will be seeing rheumatology currently reports some fatigue as well as bilateral knee pain.  Denies any joint pain in her hands, skin rash or shortness of breath  ECOG PS- 0  Pain scale- 0   Review of systems- Review of Systems  Constitutional: Positive for malaise/fatigue. Negative for chills, fever and weight loss.  HENT: Negative for congestion, ear discharge and nosebleeds.   Eyes: Negative for blurred vision.  Respiratory: Negative for cough, hemoptysis, sputum production, shortness of breath and wheezing.   Cardiovascular: Negative for chest pain, palpitations, orthopnea and claudication.  Gastrointestinal: Negative for abdominal pain, blood in stool, constipation, diarrhea, heartburn, melena, nausea and vomiting.  Genitourinary: Negative for dysuria, flank pain, frequency, hematuria and urgency.  Musculoskeletal: Positive for joint pain. Negative for back pain and myalgias.  Skin: Negative for rash.    Neurological: Negative for dizziness, tingling, focal weakness, seizures, weakness and headaches.  Endo/Heme/Allergies: Does not bruise/bleed easily.  Psychiatric/Behavioral: Negative for depression and suicidal ideas. The patient does not have insomnia.     Allergies  Allergen Reactions  . Pravastatin     Abnormal liver function tests  . Tramadol Nausea Only    There are no problems to display for this patient.    Past Medical History:  Diagnosis Date  . Anxiety   . Arthritis    knee (R)  . Asthma   . Depression   . Diabetes mellitus without complication ([place])   . Dyspnea   . Hypertension   . Hypothyroidism   . Increased serum lipids   . Wears contact lenses      Past Surgical History:  Procedure Laterality Date  . ABDOMINAL HYSTERECTOMY    . CARPAL TUNNEL RELEASE Right 07/26/2018   Procedure: CARPAL TUNNEL RELEASE ENDOSCOPIC;  Surgeon: Corky Mull, MD;  Location: [facility];  Service: Orthopedics;  Laterality: Right;  diabetic - oral meds  . CHOLECYSTECTOMY    . COLONOSCOPY WITH ESOPHAGOGASTRODUODENOSCOPY (EGD)    . COLONOSCOPY WITH PROPOFOL N/A 01/13/2018   Procedure: COLONOSCOPY WITH PROPOFOL;  Surgeon: Manya Silvas, MD;  Location: [facility] ENDOSCOPY;  Service: Endoscopy;  Laterality: N/A;  . HERNIA REPAIR    . left rotator cuff repair Left   . TUBAL LIGATION      Social History   Socioeconomic History  . Marital status: Married    Spouse name: Not on file  . Number of children: Not on file  . Years of education: Not on file  . Highest education level: Not on file  Occupational History  .  Not on file  Tobacco Use  . Smoking status: Former Smoker    Packs/day: 0.75    Years: 20.00    Pack years: 15.00    Quit date: 09/07/2007    Years since quitting: 12.6  . Smokeless tobacco: Never Used  Vaping Use  . Vaping Use: Never used  Substance and Sexual Activity  . Alcohol use: Yes    Alcohol/week: 2.0 standard drinks    Types: 2 Standard  drinks or equivalent per week    Comment: occasionally (4x/yr)  . Drug use: Never  . Sexual activity: Not on file  Other Topics Concern  . Not on file  Social History Narrative  . Not on file   Social Determinants of Health   Financial Resource Strain:   . Difficulty of Paying Living Expenses: Not on file  Food Insecurity:   . Worried About Charity fundraiser in the Last Year: Not on file  . Ran Out of Food in the Last Year: Not on file  Transportation Needs:   . Lack of Transportation (Medical): Not on file  . Lack of Transportation (Non-Medical): Not on file  Physical Activity:   . Days of Exercise per Week: Not on file  . Minutes of Exercise per Session: Not on file  Stress:   . Feeling of Stress : Not on file  Social Connections:   . Frequency of Communication with Friends and Family: Not on file  . Frequency of Social Gatherings with Friends and Family: Not on file  . Attends Religious Services: Not on file  . Active Member of Clubs or Organizations: Not on file  . Attends Archivist Meetings: Not on file  . Marital Status: Not on file  Intimate Partner Violence:   . Fear of Current or Ex-Partner: Not on file  . Emotionally Abused: Not on file  . Physically Abused: Not on file  . Sexually Abused: Not on file     Family History  Problem Relation Age of Onset  . Breast cancer Mother 61  . Breast cancer Sister 53  . Diabetes Sister   . Diabetes Father   . Diabetes Brother      Current Outpatient Medications:  .  albuterol (PROVENTIL HFA;VENTOLIN HFA) 108 (90 Base) MCG/ACT inhaler, Inhale into the lungs every 6 (six) hours as needed for wheezing or shortness of breath., Disp: , Rfl:  .  azelastine (ASTELIN) 0.1 % nasal spray, Place into both nostrils 2 (two) times daily as needed for rhinitis. Use in each nostril as directed, Disp: , Rfl:  .  citalopram (CELEXA) 20 MG tablet, Take 20 mg by mouth daily., Disp: , Rfl:  .  cycloSPORINE (RESTASIS) 0.05 %  ophthalmic emulsion, Place 1 drop into both eyes 2 (two) times daily., Disp: , Rfl:  .  fluticasone (FLONASE) 50 MCG/ACT nasal spray, Place 2 sprays into both nostrils daily as needed. , Disp: , Rfl:  .  Fluticasone-Salmeterol (ADVAIR) 250-50 MCG/DOSE AEPB, Inhale 1 puff into the lungs 2 (two) times daily., Disp: , Rfl:  .  HYDROcodone-acetaminophen (NORCO/VICODIN) 5-325 MG tablet, Take 1-2 tablets by mouth every 6 (six) hours as needed for moderate pain., Disp: 20 tablet, Rfl: 0 .  levothyroxine (SYNTHROID, LEVOTHROID) 75 MCG tablet, Take 75 mcg by mouth daily before breakfast., Disp: , Rfl:  .  lisinopril (PRINIVIL,ZESTRIL) 40 MG tablet, Take 40 mg by mouth daily., Disp: , Rfl:  .  meloxicam (MOBIC) 7.5 MG tablet, Take 7.5 mg by mouth  daily., Disp: , Rfl:  .  metFORMIN (GLUCOPHAGE) 1000 MG tablet, Take 1,000 mg by mouth 2 (two) times daily with a meal., Disp: , Rfl:  .  montelukast (SINGULAIR) 10 MG tablet, Take 10 mg by mouth at bedtime., Disp: , Rfl:  .  rosuvastatin (CRESTOR) 10 MG tablet, Take 10 mg by mouth daily., Disp: , Rfl:    Physical exam: There were no vitals filed for this visit. Physical Exam Constitutional:      General: She is not in acute distress. Cardiovascular:     Rate and Rhythm: Normal rate and regular rhythm.     Heart sounds: Normal heart sounds.  Pulmonary:     Effort: Pulmonary effort is normal.     Breath sounds: Normal breath sounds.  Abdominal:     General: Bowel sounds are normal.     Palpations: Abdomen is soft.  Skin:    General: Skin is warm and dry.  Neurological:     Mental Status: She is alert and oriented to person, place, and time.        No flowsheet data found. No flowsheet data found.  No images are attached to the encounter.  US RENAL  Result Date: 04/28/2020 CLINICAL DATA:  59 year old female with chronic kidney disease. EXAM: RENAL / URINARY TRACT ULTRASOUND COMPLETE COMPARISON:  Renal ultrasound dated 08/10/2016. FINDINGS: Right  Kidney: Renal measurements: 10.2 x 4.5 x 4.7 cm = volume: 111 mL. There is diffuse increased renal parenchymal echogenicity. There is a 3 cm inferior pole cyst. There is mild fullness of the right renal collecting system. No shadowing stone. Left Kidney: Renal measurements: 10.1 x 4.9 x 3.8 cm = volume: 99 mL. There is increased renal parenchyma echogenicity. No hydronephrosis or shadowing stone. Bladder: Appears normal for degree of bladder distention. Bilateral ureteral jets noted. Other: None. IMPRESSION: 1. Mildly echogenic kidneys in keeping with chronic kidney disease. 2. Mild fullness of the right renal collecting system.  No stone. Electronically Signed   By: Anner Crete M.D.   On: 04/28/2020 19:44    Assessment and plan- Patient is a 59 y.o. female referred for abnormal SPEP  Patient noted to have lower percentage of gammaglobulins on SPEP without frank M protein.  No evidence of anemia, hypercalcemia.  She has stage III CKD which is being worked up by nephrology.  She is also going to be seeing rheumatology for positive ANA.Based on her blood work suspicion for MGUS or multiple myeloma is low.  I will plan to get a CBC with differential myeloma panel and serum free light chains today.  Video visit with me in 2 weeks time.   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 Abnormal SPEP     Dr. Randa Evens, MD, MPH [facility] [phone] 05/13/2020

## 2020-05-14 LAB — KAPPA/LAMBDA LIGHT CHAINS
Kappa free light chain: 17.3 mg/L (ref 3.3–19.4)
Kappa, lambda light chain ratio: 1.27 (ref 0.26–1.65)
Lambda free light chains: 13.6 mg/L (ref 5.7–26.3)

## 2020-05-15 ENCOUNTER — Ambulatory Visit
Admission: RE | Admit: 2020-05-15 | Discharge: 2020-05-15 | Disposition: A | Discharge status: Home / Self Care | Payer: 59 | Source: Ambulatory Visit | Attending: Physician Assistant | Admitting: Physician Assistant

## 2020-05-15 DIAGNOSIS — N6310 Unspecified lump in the right breast, unspecified quadrant: Secondary | ICD-10-CM | POA: Diagnosis present

## 2020-05-15 LAB — MULTIPLE MYELOMA PANEL, SERUM
Albumin SerPl Elph-Mcnc: 4 g/dL (ref 2.9–4.4)
Albumin/Glob SerPl: 1.7 (ref 0.7–1.7)
Alpha 1: 0.2 g/dL (ref 0.0–0.4)
Alpha2 Glob SerPl Elph-Mcnc: 0.6 g/dL (ref 0.4–1.0)
B-Globulin SerPl Elph-Mcnc: 1 g/dL (ref 0.7–1.3)
Gamma Glob SerPl Elph-Mcnc: 0.7 g/dL (ref 0.4–1.8)
Globulin, Total: 2.5 g/dL (ref 2.2–3.9)
IgA: 209 mg/dL (ref 87–352)
IgG (Immunoglobin G), Serum: 713 mg/dL (ref 586–1602)
IgM (Immunoglobulin M), Srm: 43 mg/dL (ref 26–217)
Total Protein ELP: 6.5 g/dL (ref 6.0–8.5)

## 2020-05-16 ENCOUNTER — Encounter: Payer: Self-pay | Admitting: Oncology

## 2020-05-21 ENCOUNTER — Other Ambulatory Visit: Payer: Self-pay | Admitting: Physician Assistant

## 2020-05-21 DIAGNOSIS — N6310 Unspecified lump in the right breast, unspecified quadrant: Secondary | ICD-10-CM

## 2020-05-27 ENCOUNTER — Inpatient Hospital Stay (HOSPITAL_BASED_OUTPATIENT_CLINIC_OR_DEPARTMENT_OTHER): Payer: 59 | Admitting: Oncology

## 2020-05-27 ENCOUNTER — Encounter: Payer: Self-pay | Admitting: Oncology

## 2020-05-27 DIAGNOSIS — R778 Other specified abnormalities of plasma proteins: Secondary | ICD-10-CM | POA: Diagnosis not present

## 2020-05-27 NOTE — Progress Notes (Signed)
Pt has no concerns for this visit

## 2020-06-01 NOTE — Progress Notes (Signed)
I connected with Wanda Smith on 06/01/20 at  2:15 PM EDT by video enabled telemedicine visit and verified that I am speaking with the correct person using two identifiers.   I discussed the limitations, risks, security and privacy concerns of performing an evaluation and management service by telemedicine and the availability of in-person appointments. I also discussed with the patient that there may be a patient responsible charge related to this service. The patient expressed understanding and agreed to proceed.  Other persons participating in the visit and their role in the encounter:  none  Patient's location:  work Provider's location:  work  Risk analyst Complaint:  Discuss results of bloodwork  History of present illness: Patient is a 59 year old female with history of type 2 diabetes, hypertension, fatty liver disease, hypothyroidism from Graves' disease who was seen by Dr. Holley Raring for stage III CKD as a part of her work-up she had SPEP done which showed no abnormal M protein but decreased gammaglobulin of 0.7%.  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  Her CBC showed a normal H&H of 13.3/29.9.  Serum creatinine of 1.05.  Calcium levels normal at 9.9.  She was also found to have a positive ANA for which she will be seeing rheumatology   Results of blood work from 05/13/2020 were as follows: CBC was normal with an H&H of 12.7/38.  Myeloma panel showed no M protein on SPEP and immunofixation.  Serum free light chain ratio was normal at 1.27  Interval history patient is doing well and denies any specific complaints at this time   Review of Systems  Constitutional: Negative for chills, fever, malaise/fatigue and weight loss.  HENT: Negative for congestion, ear discharge and nosebleeds.   Eyes: Negative for blurred vision.  Respiratory: Negative for cough, hemoptysis, sputum production, shortness  of breath and wheezing.   Cardiovascular: Negative for chest pain, palpitations, orthopnea and claudication.  Gastrointestinal: Negative for abdominal pain, blood in stool, constipation, diarrhea, heartburn, melena, nausea and vomiting.  Genitourinary: Negative for dysuria, flank pain, frequency, hematuria and urgency.  Musculoskeletal: Negative for back pain, joint pain and myalgias.  Skin: Negative for rash.  Neurological: Negative for dizziness, tingling, focal weakness, seizures, weakness and headaches.  Endo/Heme/Allergies: Does not bruise/bleed easily.  Psychiatric/Behavioral: Negative for depression and suicidal ideas. The patient does not have insomnia.     Allergies  Allergen Reactions  . Pravastatin     Abnormal liver function tests  . Tramadol Nausea Only    Past Medical History:  Diagnosis Date  . Anxiety   . Arthritis    knee (R)  . Asthma   . Depression   . Diabetes mellitus without complication ([place])   . Dyspnea   . Hypertension   . Hypothyroidism   . Increased serum lipids   . Wears contact lenses     Past Surgical History:  Procedure Laterality Date  . ABDOMINAL HYSTERECTOMY    . CARPAL TUNNEL RELEASE Right 07/26/2018   Procedure: CARPAL TUNNEL RELEASE ENDOSCOPIC;  Surgeon: Corky Mull, MD;  Location: [place];  Service: Orthopedics;  Laterality: Right;  diabetic - oral meds  . CHOLECYSTECTOMY    . COLONOSCOPY WITH ESOPHAGOGASTRODUODENOSCOPY (EGD)    . COLONOSCOPY WITH PROPOFOL N/A 01/13/2018   Procedure: COLONOSCOPY WITH PROPOFOL;  Surgeon: Manya Silvas, MD;  Location: [facility] ENDOSCOPY;  Service: Endoscopy;  Laterality: N/A;  . HERNIA REPAIR    . left rotator cuff  repair Left   . TUBAL LIGATION      Social History   Socioeconomic History  . Marital status: Married    Spouse name: Not on file  . Number of children: Not on file  . Years of education: Not on file  . Highest education level: Not on file  Occupational History  . Not  on file  Tobacco Use  . Smoking status: Former Smoker    Packs/day: 0.75    Years: 20.00    Pack years: 15.00    Quit date: 09/07/2007    Years since quitting: 12.7  . Smokeless tobacco: Never Used  Vaping Use  . Vaping Use: Never used  Substance and Sexual Activity  . Alcohol use: Yes    Alcohol/week: 2.0 standard drinks    Types: 2 Standard drinks or equivalent per week    Comment: occasionally (4x/yr)  . Drug use: Never  . Sexual activity: Not on file  Other Topics Concern  . Not on file  Social History Narrative  . Not on file   Social Determinants of Health   Financial Resource Strain:   . Difficulty of Paying Living Expenses: Not on file  Food Insecurity:   . Worried About Charity fundraiser in the Last Year: Not on file  . Ran Out of Food in the Last Year: Not on file  Transportation Needs:   . Lack of Transportation (Medical): Not on file  . Lack of Transportation (Non-Medical): Not on file  Physical Activity:   . Days of Exercise per Week: Not on file  . Minutes of Exercise per Session: Not on file  Stress:   . Feeling of Stress : Not on file  Social Connections:   . Frequency of Communication with Friends and Family: Not on file  . Frequency of Social Gatherings with Friends and Family: Not on file  . Attends Religious Services: Not on file  . Active Member of Clubs or Organizations: Not on file  . Attends Archivist Meetings: Not on file  . Marital Status: Not on file  Intimate Partner Violence:   . Fear of Current or Ex-Partner: Not on file  . Emotionally Abused: Not on file  . Physically Abused: Not on file  . Sexually Abused: Not on file    Family History  Problem Relation Age of Onset  . Breast cancer Mother 35  . Breast cancer Sister 32  . Diabetes Sister   . Diabetes Father   . Diabetes Brother      Current Outpatient Medications:  .  albuterol (PROVENTIL HFA;VENTOLIN HFA) 108 (90 Base) MCG/ACT inhaler, Inhale into the lungs  every 6 (six) hours as needed for wheezing or shortness of breath., Disp: , Rfl:  .  azelastine (ASTELIN) 0.1 % nasal spray, Place into both nostrils 2 (two) times daily as needed for rhinitis. Use in each nostril as directed, Disp: , Rfl:  .  citalopram (CELEXA) 20 MG tablet, Take 20 mg by mouth daily., Disp: , Rfl:  .  fluticasone (FLONASE) 50 MCG/ACT nasal spray, Place 2 sprays into both nostrils daily as needed. , Disp: , Rfl:  .  Fluticasone-Salmeterol (ADVAIR) 250-50 MCG/DOSE AEPB, Inhale 1 puff into the lungs 2 (two) times daily., Disp: , Rfl:  .  levothyroxine (SYNTHROID) 88 MCG tablet, Take 88 mcg by mouth daily before breakfast., Disp: , Rfl:  .  lisinopril (PRINIVIL,ZESTRIL) 40 MG tablet, Take 40 mg by mouth daily., Disp: , Rfl:  .  metFORMIN (GLUCOPHAGE) 1000 MG tablet, Take 1,000 mg by mouth 2 (two) times daily with a meal., Disp: , Rfl:  .  montelukast (SINGULAIR) 10 MG tablet, Take 10 mg by mouth at bedtime., Disp: , Rfl:  .  rosuvastatin (CRESTOR) 10 MG tablet, Take 10 mg by mouth daily., Disp: , Rfl:   US BREAST LTD UNI RIGHT INC AXILLA  Result Date: 05/15/2020 CLINICAL DATA:  BI-RADS 3 follow-up EXAM: ULTRASOUND OF THE RIGHT BREAST COMPARISON:  Previous exam(s). FINDINGS: Targeted ultrasound is performed of the RIGHT breast at 4 o'clock 2 cm from the nipple. There is redemonstration a nearly anechoic oval circumscribed mass with posterior acoustic enhancement and thin internal septations. It measures 8 x 6 x 3 mm, unchanged comparison to prior when accounting for differences in scan plane technique. IMPRESSION: Stable appearance of a probably benign RIGHT breast mass at 4 o'clock. Recommend follow-up ultrasound and mammogram in 6 months to assess for stability. This will establish 1 year of stability. RECOMMENDATION: Bilateral diagnostic mammogram and RIGHT ultrasound in 6 months. Patient is due for LEFT screening at this point in time. This will establish 1 year of sonographic  stability. I have discussed the findings and recommendations with the patient. If applicable, a reminder letter will be sent to the patient regarding the next appointment. BI-RADS CATEGORY  3: Probably benign. Electronically Signed   By: Valentino Saxon MD   On: 05/15/2020 09:35    No images are attached to the encounter.   No flowsheet data found. CBC Latest Ref Rng & Units 05/13/2020  WBC 4.0 - 10.5 K/uL 6.0  Hemoglobin 12.0 - 15.0 g/dL 12.7  Hematocrit 36 - 46 % 38.0  Platelets 150 - 400 K/uL 282     Observation/objective: Appears in no acute distress over video visit today.  Breathing is nonlabored  Assessment and plan: Patient is a 59 year old female referred for abnormal SPEP showing the low gamma globulin fraction  On repeat check patient CBC is normal.  Myeloma panel does not show any M protein on immunofixation or SPEP.  Serum free light chain ratio is normal.  Her labs are therefore not suggestive of multiple myeloma and does not require any follow-up at this time.She can follow-up with nephrology for her CKD which does not appear to be secondary to myeloma  Follow-up instructions: No follow-up needed  I discussed the assessment and treatment plan with the patient. The patient was provided an opportunity to ask questions and all were answered. The patient agreed with the plan and demonstrated an understanding of the instructions.   The patient was advised to call back or seek an in-person evaluation if the symptoms worsen or if the condition fails to improve as anticipated.   Visit Diagnosis: 1. Abnormal SPEP     Dr. Randa Evens, MD, MPH [facility] at [facility] Tel- [phone] 06/01/2020 10:24 AM

## 2020-10-31 ENCOUNTER — Other Ambulatory Visit: Payer: Self-pay

## 2020-10-31 ENCOUNTER — Ambulatory Visit
Admission: RE | Admit: 2020-10-31 | Discharge: 2020-10-31 | Disposition: A | Discharge status: Home / Self Care | Payer: 59 | Source: Ambulatory Visit | Attending: Physician Assistant | Admitting: Physician Assistant

## 2020-10-31 DIAGNOSIS — N6310 Unspecified lump in the right breast, unspecified quadrant: Secondary | ICD-10-CM | POA: Insufficient documentation

## 2021-03-20 ENCOUNTER — Other Ambulatory Visit: Payer: Self-pay | Admitting: Student

## 2021-03-20 DIAGNOSIS — M7521 Bicipital tendinitis, right shoulder: Secondary | ICD-10-CM

## 2021-03-20 DIAGNOSIS — M7501 Adhesive capsulitis of right shoulder: Secondary | ICD-10-CM

## 2021-03-20 DIAGNOSIS — M7581 Other shoulder lesions, right shoulder: Secondary | ICD-10-CM

## 2021-03-26 ENCOUNTER — Other Ambulatory Visit: Payer: Self-pay

## 2021-03-26 ENCOUNTER — Ambulatory Visit
Admission: RE | Admit: 2021-03-26 | Discharge: 2021-03-26 | Disposition: A | Discharge status: Home / Self Care | Payer: 59 | Source: Ambulatory Visit | Attending: Student | Admitting: Student

## 2021-03-26 DIAGNOSIS — M7521 Bicipital tendinitis, right shoulder: Secondary | ICD-10-CM

## 2021-03-26 DIAGNOSIS — M7501 Adhesive capsulitis of right shoulder: Secondary | ICD-10-CM | POA: Diagnosis present

## 2021-03-26 DIAGNOSIS — M7581 Other shoulder lesions, right shoulder: Secondary | ICD-10-CM | POA: Insufficient documentation

## 2021-04-01 ENCOUNTER — Other Ambulatory Visit: Payer: Self-pay | Admitting: Physician Assistant

## 2021-04-01 ENCOUNTER — Ambulatory Visit
Admission: RE | Admit: 2021-04-01 | Discharge: 2021-04-01 | Disposition: A | Discharge status: Home / Self Care | Payer: 59 | Source: Ambulatory Visit | Attending: Physician Assistant | Admitting: Physician Assistant

## 2021-04-01 ENCOUNTER — Other Ambulatory Visit: Payer: Self-pay

## 2021-04-01 DIAGNOSIS — G44311 Acute post-traumatic headache, intractable: Secondary | ICD-10-CM

## 2021-04-01 DIAGNOSIS — S0990XA Unspecified injury of head, initial encounter: Secondary | ICD-10-CM

## 2021-04-01 DIAGNOSIS — R42 Dizziness and giddiness: Secondary | ICD-10-CM | POA: Insufficient documentation

## 2021-04-15 ENCOUNTER — Other Ambulatory Visit: Payer: Self-pay | Admitting: Family Medicine

## 2021-04-15 DIAGNOSIS — M5412 Radiculopathy, cervical region: Secondary | ICD-10-CM

## 2021-04-21 ENCOUNTER — Ambulatory Visit: Payer: 59

## 2021-04-22 ENCOUNTER — Ambulatory Visit
Admission: RE | Admit: 2021-04-22 | Discharge: 2021-04-22 | Disposition: A | Discharge status: Home / Self Care | Payer: 59 | Source: Ambulatory Visit | Attending: Family Medicine | Admitting: Family Medicine

## 2021-04-22 ENCOUNTER — Other Ambulatory Visit: Payer: Self-pay

## 2021-04-22 DIAGNOSIS — M5412 Radiculopathy, cervical region: Secondary | ICD-10-CM

## 2021-09-29 ENCOUNTER — Other Ambulatory Visit: Payer: Self-pay | Admitting: Surgery

## 2021-10-05 ENCOUNTER — Other Ambulatory Visit: Payer: Self-pay | Admitting: Physician Assistant

## 2021-10-05 DIAGNOSIS — N6310 Unspecified lump in the right breast, unspecified quadrant: Secondary | ICD-10-CM

## 2021-10-13 ENCOUNTER — Encounter
Admission: RE | Admit: 2021-10-13 | Discharge: 2021-10-13 | Disposition: A | Discharge status: Home / Self Care | Payer: BC Managed Care – PPO | Source: Ambulatory Visit | Attending: Surgery | Admitting: Surgery

## 2021-10-13 ENCOUNTER — Other Ambulatory Visit: Payer: Self-pay

## 2021-10-13 HISTORY — DX: Chronic kidney disease, unspecified: N18.9

## 2021-10-13 HISTORY — DX: Anemia, unspecified: D64.9

## 2021-10-13 HISTORY — DX: Chronic obstructive pulmonary disease, unspecified: J44.9

## 2021-10-13 HISTORY — DX: Gastro-esophageal reflux disease without esophagitis: K21.9

## 2021-10-13 NOTE — Patient Instructions (Signed)
Your procedure is scheduled on:10-21-21 Wednesday Report to the Registration Desk on the 1st floor of the Medical Mall.Then proceed to the 2nd floor Surgery Desk in the Medical Mall To find out your arrival time, please call [phone] between 1PM - 3PM on:10-20-21 Tuesday  REMEMBER: Instructions that are not followed completely may result in serious medical risk, up to and including death; or upon the discretion of your surgeon and anesthesiologist your surgery may need to be rescheduled.  Do not eat food after midnight the night before surgery.  No gum chewing, lozengers or hard candies.  You may however, drink Water up to 2 hours before you are scheduled to arrive for your surgery. Do not drink anything within 2 hours of your scheduled arrival time.  Type 1 and Type 2 diabetics should only drink water.  TAKE THESE MEDICATIONS THE MORNING OF SURGERY WITH A SIP OF WATER: -levothyroxine (SYNTHROID)  Use your albuterol (PROVENTIL HFA;VENTOLIN HFA) inhaler the day of surgery and bring inhaler to the hospital  Stop your metFORMIN (GLUCOPHAGE) 2 days prior to surgery-Last dose on 10-18-21 Sunday  Stop your dapagliflozin propanediol (FARXIGA) 3 days prior to surgery-Last dose on 10-17-21 Saturday  One week prior to surgery: Stop Anti-inflammatories (NSAIDS) such as Advil, Aleve, Ibuprofen, Motrin, Naproxen, Naprosyn and Aspirin based products such as Excedrin, Goodys Powder, BC Powder.You may however, take Tylenol if needed for pain up until the day of surgery.  Stop ANY OVER THE COUNTER supplements/vitamins NOW (10-13-21) until after surgery.  No Alcohol for 24 hours before or after surgery.  No Smoking including e-cigarettes for 24 hours prior to surgery.  No chewable tobacco products for at least 6 hours prior to surgery.  No nicotine patches on the day of surgery.  Do not use any "recreational" drugs for at least a week prior to your surgery.  Please be advised that the combination of  cocaine and anesthesia may have negative outcomes, up to and including death. If you test positive for cocaine, your surgery will be cancelled.  On the morning of surgery brush your teeth with toothpaste and water, you may rinse your mouth with mouthwash if you wish. Do not swallow any toothpaste or mouthwash.  Use CHG Soap as directed on instruction sheet.  Do not wear jewelry, make-up, hairpins, clips or nail polish.  Do not wear lotions, powders, or perfumes.   Do not shave body from the neck down 48 hours prior to surgery just in case you cut yourself which could leave a site for infection.  Also, freshly shaved skin may become irritated if using the CHG soap.  Contact lenses, hearing aids and dentures may not be worn into surgery.  Do not bring valuables to the hospital. [facility] is not responsible for any missing/lost belongings or valuables.   Notify your doctor if there is any change in your medical condition (cold, fever, infection).  Wear comfortable clothing (specific to your surgery type) to the hospital.  After surgery, you can help prevent lung complications by doing breathing exercises.  Take deep breaths and cough every 1-2 hours. Your doctor may order a device called an Incentive Spirometer to help you take deep breaths. When coughing or sneezing, hold a pillow firmly against your incision with both hands. This is called splinting. Doing this helps protect your incision. It also decreases belly discomfort.  If you are being admitted to the hospital overnight, leave your suitcase in the car. After surgery it may be brought to your  room.  If you are being discharged the day of surgery, you will not be allowed to drive home. You will need a responsible adult (18 years or older) to drive you home and stay with you that night.   If you are taking public transportation, you will need to have a responsible adult (18 years or older) with you. Please confirm with your  physician that it is acceptable to use public transportation.   Please call the Pre-admissions Testing Dept. at [phone] if you have any questions about these instructions.  Surgery Visitation Policy:  Patients undergoing a surgery or procedure may have one family member or support person with them as long as that person is not COVID-19 positive or experiencing its symptoms.  That person may remain in the waiting area during the procedure and may rotate out with other people.  Inpatient Visitation:    Visiting hours are 7 a.m. to 8 p.m. Up to two visitors ages 16+ are allowed at one time in a patient room. The visitors may rotate out with other people during the day. Visitors must check out when they leave, or other visitors will not be allowed. One designated support person may remain overnight. The visitor must pass COVID-19 screenings, use hand sanitizer when entering and exiting the patients room and wear a mask at all times, including in the patients room. Patients must also wear a mask when staff or their visitor are in the room. Masking is required regardless of vaccination status.

## 2021-10-15 ENCOUNTER — Other Ambulatory Visit: Payer: Self-pay

## 2021-10-15 ENCOUNTER — Encounter
Admission: RE | Admit: 2021-10-15 | Discharge: 2021-10-15 | Disposition: A | Discharge status: Home / Self Care | Payer: BC Managed Care – PPO | Source: Ambulatory Visit | Attending: Surgery | Admitting: Surgery

## 2021-10-15 DIAGNOSIS — E1159 Type 2 diabetes mellitus with other circulatory complications: Secondary | ICD-10-CM | POA: Diagnosis not present

## 2021-10-15 DIAGNOSIS — I10 Essential (primary) hypertension: Secondary | ICD-10-CM | POA: Insufficient documentation

## 2021-10-15 DIAGNOSIS — Z01810 Encounter for preprocedural cardiovascular examination: Secondary | ICD-10-CM | POA: Insufficient documentation

## 2021-10-21 ENCOUNTER — Encounter: Payer: Self-pay | Admitting: Surgery

## 2021-10-21 ENCOUNTER — Ambulatory Visit: Payer: BC Managed Care – PPO | Admitting: Anesthesiology

## 2021-10-21 ENCOUNTER — Ambulatory Visit: Payer: BC Managed Care – PPO | Admitting: Urgent Care

## 2021-10-21 ENCOUNTER — Ambulatory Visit
Admission: RE | Admit: 2021-10-21 | Discharge: 2021-10-21 | Disposition: A | Discharge status: Home / Self Care | Payer: BC Managed Care – PPO | Attending: Surgery | Admitting: Surgery

## 2021-10-21 ENCOUNTER — Other Ambulatory Visit: Payer: Self-pay

## 2021-10-21 ENCOUNTER — Encounter
Admission: RE | Disposition: A | Discharge status: Home / Self Care | Payer: Self-pay | Source: Home / Self Care | Attending: Surgery

## 2021-10-21 ENCOUNTER — Ambulatory Visit: Payer: BC Managed Care – PPO

## 2021-10-21 DIAGNOSIS — M1990 Unspecified osteoarthritis, unspecified site: Secondary | ICD-10-CM | POA: Diagnosis not present

## 2021-10-21 DIAGNOSIS — E1122 Type 2 diabetes mellitus with diabetic chronic kidney disease: Secondary | ICD-10-CM | POA: Insufficient documentation

## 2021-10-21 DIAGNOSIS — M7501 Adhesive capsulitis of right shoulder: Secondary | ICD-10-CM | POA: Diagnosis not present

## 2021-10-21 DIAGNOSIS — F32A Depression, unspecified: Secondary | ICD-10-CM | POA: Diagnosis not present

## 2021-10-21 DIAGNOSIS — E039 Hypothyroidism, unspecified: Secondary | ICD-10-CM | POA: Insufficient documentation

## 2021-10-21 DIAGNOSIS — F419 Anxiety disorder, unspecified: Secondary | ICD-10-CM | POA: Insufficient documentation

## 2021-10-21 DIAGNOSIS — J449 Chronic obstructive pulmonary disease, unspecified: Secondary | ICD-10-CM | POA: Diagnosis not present

## 2021-10-21 DIAGNOSIS — I129 Hypertensive chronic kidney disease with stage 1 through stage 4 chronic kidney disease, or unspecified chronic kidney disease: Secondary | ICD-10-CM | POA: Diagnosis not present

## 2021-10-21 DIAGNOSIS — K219 Gastro-esophageal reflux disease without esophagitis: Secondary | ICD-10-CM | POA: Insufficient documentation

## 2021-10-21 DIAGNOSIS — N183 Chronic kidney disease, stage 3 unspecified: Secondary | ICD-10-CM | POA: Insufficient documentation

## 2021-10-21 DIAGNOSIS — Z7984 Long term (current) use of oral hypoglycemic drugs: Secondary | ICD-10-CM | POA: Diagnosis not present

## 2021-10-21 DIAGNOSIS — Z419 Encounter for procedure for purposes other than remedying health state, unspecified: Secondary | ICD-10-CM

## 2021-10-21 DIAGNOSIS — Z87891 Personal history of nicotine dependence: Secondary | ICD-10-CM | POA: Insufficient documentation

## 2021-10-21 DIAGNOSIS — Z79899 Other long term (current) drug therapy: Secondary | ICD-10-CM | POA: Insufficient documentation

## 2021-10-21 DIAGNOSIS — M7521 Bicipital tendinitis, right shoulder: Secondary | ICD-10-CM | POA: Insufficient documentation

## 2021-10-21 DIAGNOSIS — Z7951 Long term (current) use of inhaled steroids: Secondary | ICD-10-CM | POA: Insufficient documentation

## 2021-10-21 HISTORY — PX: SHOULDER ARTHROSCOPY WITH SUBACROMIAL DECOMPRESSION, ROTATOR CUFF REPAIR AND BICEP TENDON REPAIR: SHX5687

## 2021-10-21 LAB — GLUCOSE, CAPILLARY
Glucose-Capillary: 113 mg/dL — ABNORMAL HIGH (ref 70–99)
Glucose-Capillary: 121 mg/dL — ABNORMAL HIGH (ref 70–99)

## 2021-10-21 SURGERY — SHOULDER ARTHROSCOPY WITH SUBACROMIAL DECOMPRESSION, ROTATOR CUFF REPAIR AND BICEP TENDON REPAIR
Anesthesia: Regional | Site: Shoulder | Laterality: Right

## 2021-10-21 MED ORDER — FENTANYL CITRATE PF 50 MCG/ML IJ SOSY: 50.0000 ug | PREFILLED_SYRINGE | INTRAMUSCULAR | Status: DC | PRN

## 2021-10-21 MED ORDER — PHENYLEPHRINE HCL (PRESSORS) 10 MG/ML IV SOLN
INTRAVENOUS | Status: AC
  Filled 2021-10-21: qty 1

## 2021-10-21 MED ORDER — GLYCOPYRROLATE 0.2 MG/ML IJ SOLN
INTRAMUSCULAR | Status: DC | PRN
  Administered 2021-10-21: .2 mg via INTRAVENOUS

## 2021-10-21 MED ORDER — ONDANSETRON HCL 4 MG/2ML IJ SOLN
INTRAMUSCULAR | Status: DC | PRN
  Administered 2021-10-21 (×2): 4 mg via INTRAVENOUS

## 2021-10-21 MED ORDER — FENTANYL CITRATE PF 50 MCG/ML IJ SOSY
PREFILLED_SYRINGE | INTRAMUSCULAR | Status: AC
  Administered 2021-10-21: 50 ug via INTRAVENOUS
  Filled 2021-10-21: qty 2

## 2021-10-21 MED ORDER — BUPIVACAINE-EPINEPHRINE 0.5% -1:200000 IJ SOLN
INTRAMUSCULAR | Status: DC | PRN
  Administered 2021-10-21: 30 mL

## 2021-10-21 MED ORDER — HYDRALAZINE HCL 20 MG/ML IJ SOLN
INTRAMUSCULAR | Status: AC
  Filled 2021-10-21: qty 1

## 2021-10-21 MED ORDER — CEFAZOLIN SODIUM-DEXTROSE 2-4 GM/100ML-% IV SOLN
INTRAVENOUS | Status: AC
  Filled 2021-10-21: qty 100

## 2021-10-21 MED ORDER — FENTANYL CITRATE (PF) 100 MCG/2ML IJ SOLN: 25.0000 ug | INTRAMUSCULAR | Status: DC | PRN

## 2021-10-21 MED ORDER — SODIUM CHLORIDE 0.9 % IV SOLN: INTRAVENOUS | Status: DC

## 2021-10-21 MED ORDER — BUPIVACAINE LIPOSOME 1.3 % IJ SUSP
INTRAMUSCULAR | Status: AC
  Filled 2021-10-21: qty 20

## 2021-10-21 MED ORDER — CHLORHEXIDINE GLUCONATE 0.12 % MT SOLN
OROMUCOSAL | Status: AC
  Administered 2021-10-21: 15 mL via OROMUCOSAL
  Filled 2021-10-21: qty 15

## 2021-10-21 MED ORDER — ORAL CARE MOUTH RINSE: 15.0000 mL | Freq: Once | OROMUCOSAL | Status: AC

## 2021-10-21 MED ORDER — BUPIVACAINE HCL (PF) 0.5 % IJ SOLN
INTRAMUSCULAR | Status: DC | PRN
Start: 2021-10-21 — End: 2021-10-21
  Administered 2021-10-21: 10 mL

## 2021-10-21 MED ORDER — CHLORHEXIDINE GLUCONATE 0.12 % MT SOLN
OROMUCOSAL | Status: AC
  Filled 2021-10-21: qty 15

## 2021-10-21 MED ORDER — ROCURONIUM BROMIDE 100 MG/10ML IV SOLN
INTRAVENOUS | Status: DC | PRN
  Administered 2021-10-21: 60 mg via INTRAVENOUS

## 2021-10-21 MED ORDER — METOCLOPRAMIDE HCL 10 MG PO TABS: 5.0000 mg | ORAL_TABLET | Freq: Three times a day (TID) | ORAL | Status: DC | PRN

## 2021-10-21 MED ORDER — PHENYLEPHRINE HCL-NACL 20-0.9 MG/250ML-% IV SOLN
INTRAVENOUS | Status: DC | PRN
  Administered 2021-10-21: 25 ug/min via INTRAVENOUS

## 2021-10-21 MED ORDER — MIDAZOLAM HCL 2 MG/2ML IJ SOLN
INTRAMUSCULAR | Status: AC
  Administered 2021-10-21: 1 mg via INTRAVENOUS
  Filled 2021-10-21: qty 2

## 2021-10-21 MED ORDER — ONDANSETRON HCL 4 MG/2ML IJ SOLN: 4.0000 mg | Freq: Once | INTRAMUSCULAR | Status: DC | PRN

## 2021-10-21 MED ORDER — FENTANYL CITRATE (PF) 100 MCG/2ML IJ SOLN
INTRAMUSCULAR | Status: AC
  Filled 2021-10-21: qty 2

## 2021-10-21 MED ORDER — DEXAMETHASONE SODIUM PHOSPHATE 10 MG/ML IJ SOLN
INTRAMUSCULAR | Status: DC | PRN
  Administered 2021-10-21: 10 mg via INTRAVENOUS

## 2021-10-21 MED ORDER — ACETAMINOPHEN 10 MG/ML IV SOLN: 1000.0000 mg | Freq: Once | INTRAVENOUS | Status: DC | PRN

## 2021-10-21 MED ORDER — OXYCODONE HCL 5 MG PO TABS: 5.0000 mg | ORAL_TABLET | ORAL | 0 refills | Status: DC | PRN

## 2021-10-21 MED ORDER — KETOROLAC TROMETHAMINE 15 MG/ML IJ SOLN
15.0000 mg | Freq: Once | INTRAMUSCULAR | Status: AC
  Administered 2021-10-21: 15 mg via INTRAVENOUS

## 2021-10-21 MED ORDER — HYDRALAZINE HCL 20 MG/ML IJ SOLN
10.0000 mg | Freq: Once | INTRAMUSCULAR | Status: AC
  Administered 2021-10-21: 10 mg via INTRAVENOUS

## 2021-10-21 MED ORDER — BUPIVACAINE LIPOSOME 1.3 % IJ SUSP
INTRAMUSCULAR | Status: DC | PRN
  Administered 2021-10-21: 20 mL

## 2021-10-21 MED ORDER — PROPOFOL 10 MG/ML IV BOLUS
INTRAVENOUS | Status: DC | PRN
  Administered 2021-10-21: 160 mg via INTRAVENOUS

## 2021-10-21 MED ORDER — CHLORHEXIDINE GLUCONATE 0.12 % MT SOLN: 15.0000 mL | Freq: Once | OROMUCOSAL | Status: AC

## 2021-10-21 MED ORDER — OXYCODONE HCL 5 MG PO TABS: 5.0000 mg | ORAL_TABLET | Freq: Once | ORAL | Status: DC | PRN

## 2021-10-21 MED ORDER — FAMOTIDINE 20 MG PO TABS
ORAL_TABLET | ORAL | Status: AC
  Filled 2021-10-21: qty 1

## 2021-10-21 MED ORDER — 0.9 % SODIUM CHLORIDE (POUR BTL) OPTIME
TOPICAL | Status: DC | PRN
  Administered 2021-10-21: 250 mL

## 2021-10-21 MED ORDER — BUPIVACAINE-EPINEPHRINE (PF) 0.5% -1:200000 IJ SOLN
INTRAMUSCULAR | Status: AC
  Filled 2021-10-21: qty 30

## 2021-10-21 MED ORDER — ONDANSETRON HCL 4 MG/2ML IJ SOLN: 4.0000 mg | Freq: Four times a day (QID) | INTRAMUSCULAR | Status: DC | PRN

## 2021-10-21 MED ORDER — CEFAZOLIN SODIUM-DEXTROSE 2-4 GM/100ML-% IV SOLN
2.0000 g | INTRAVENOUS | Status: AC
  Administered 2021-10-21: 2 g via INTRAVENOUS

## 2021-10-21 MED ORDER — FAMOTIDINE 20 MG PO TABS: 20.0000 mg | ORAL_TABLET | Freq: Once | ORAL | Status: AC

## 2021-10-21 MED ORDER — LACTATED RINGERS IV SOLN: INTRAVENOUS | Status: DC

## 2021-10-21 MED ORDER — OXYCODONE HCL 5 MG/5ML PO SOLN: 5.0000 mg | Freq: Once | ORAL | Status: DC | PRN

## 2021-10-21 MED ORDER — PHENYLEPHRINE HCL (PRESSORS) 10 MG/ML IV SOLN
INTRAVENOUS | Status: DC | PRN
  Administered 2021-10-21 (×2): 80 ug via INTRAVENOUS

## 2021-10-21 MED ORDER — FAMOTIDINE 20 MG PO TABS
ORAL_TABLET | ORAL | Status: AC
  Administered 2021-10-21: 20 mg via ORAL
  Filled 2021-10-21: qty 1

## 2021-10-21 MED ORDER — LACTATED RINGERS IV SOLN
INTRAVENOUS | Status: DC | PRN
  Administered 2021-10-21: 3001 mL

## 2021-10-21 MED ORDER — ACETAMINOPHEN 10 MG/ML IV SOLN
INTRAVENOUS | Status: AC
  Filled 2021-10-21: qty 100

## 2021-10-21 MED ORDER — OXYCODONE HCL 5 MG PO TABS: 5.0000 mg | ORAL_TABLET | ORAL | Status: DC | PRN

## 2021-10-21 MED ORDER — FENTANYL CITRATE (PF) 100 MCG/2ML IJ SOLN
INTRAMUSCULAR | Status: DC | PRN
  Administered 2021-10-21: 50 ug via INTRAVENOUS

## 2021-10-21 MED ORDER — SUGAMMADEX SODIUM 200 MG/2ML IV SOLN
INTRAVENOUS | Status: DC | PRN
  Administered 2021-10-21 (×2): 200 mg via INTRAVENOUS

## 2021-10-21 MED ORDER — KETOROLAC TROMETHAMINE 15 MG/ML IJ SOLN
INTRAMUSCULAR | Status: AC
  Filled 2021-10-21: qty 1

## 2021-10-21 MED ORDER — METOCLOPRAMIDE HCL 5 MG/ML IJ SOLN: 5.0000 mg | Freq: Three times a day (TID) | INTRAMUSCULAR | Status: DC | PRN

## 2021-10-21 MED ORDER — ACETAMINOPHEN 10 MG/ML IV SOLN
INTRAVENOUS | Status: DC | PRN
Start: 2021-10-21 — End: 2021-10-21
  Administered 2021-10-21: 1000 mg via INTRAVENOUS

## 2021-10-21 MED ORDER — ONDANSETRON HCL 4 MG PO TABS: 4.0000 mg | ORAL_TABLET | Freq: Four times a day (QID) | ORAL | Status: DC | PRN

## 2021-10-21 MED ORDER — BUPIVACAINE HCL (PF) 0.5 % IJ SOLN
INTRAMUSCULAR | Status: AC
  Filled 2021-10-21: qty 10

## 2021-10-21 MED ORDER — PROPOFOL 1000 MG/100ML IV EMUL
INTRAVENOUS | Status: AC
  Filled 2021-10-21: qty 100

## 2021-10-21 MED ORDER — EPINEPHRINE PF 1 MG/ML IJ SOLN
INTRAMUSCULAR | Status: AC
  Filled 2021-10-21: qty 4

## 2021-10-21 MED ORDER — MIDAZOLAM HCL 2 MG/2ML IJ SOLN
1.0000 mg | INTRAMUSCULAR | Status: AC | PRN
  Administered 2021-10-21: 1 mg via INTRAVENOUS

## 2021-10-21 MED ORDER — MIDAZOLAM HCL 2 MG/2ML IJ SOLN
INTRAMUSCULAR | Status: AC
  Filled 2021-10-21: qty 2

## 2021-10-21 MED ORDER — SUCCINYLCHOLINE CHLORIDE 200 MG/10ML IV SOSY
PREFILLED_SYRINGE | INTRAVENOUS | Status: DC | PRN
  Administered 2021-10-21: 80 mg via INTRAVENOUS

## 2021-10-21 SURGICAL SUPPLY — 54 items
ANCH SUT 2 JK 1.5X2.9 2 LD (Anchor) ×1 IMPLANT
ANCHOR HEALICOIL REGEN 5.5 (Anchor) IMPLANT
ANCHOR JUGGERKNOT WTAP NDL 2.9 (Anchor) IMPLANT
ANCHOR SUT JK SZ 2 2.9 DBL SL (Anchor) ×1 IMPLANT
ANCHOR SUT QUATTRO KNTLS 4.5 (Anchor) IMPLANT
ANCHOR SUT W/ ORTHOCORD (Anchor) IMPLANT
APL PRP STRL LF DISP 70% ISPRP (MISCELLANEOUS) ×2
BIT DRILL JUGRKNT W/NDL BIT2.9 (DRILL) IMPLANT
BLADE FULL RADIUS 3.5 (BLADE) ×2 IMPLANT
BUR ACROMIONIZER 4.0 (BURR) ×2 IMPLANT
CANNULA SHAVER 8MMX76MM (CANNULA) ×2 IMPLANT
CHLORAPREP W/TINT 26 (MISCELLANEOUS) ×3 IMPLANT
COVER MAYO STAND REUSABLE (DRAPES) ×2 IMPLANT
DILATOR 5.5 THREADED HEALICOIL (MISCELLANEOUS) IMPLANT
DRILL JUGGERKNOT W/NDL BIT 2.9 (DRILL) ×2
ELECT CAUTERY BLADE 6.4 (BLADE) ×2 IMPLANT
ELECT REM PT RETURN 9FT ADLT (ELECTROSURGICAL) ×2
ELECTRODE REM PT RTRN 9FT ADLT (ELECTROSURGICAL) ×1 IMPLANT
GAUZE SPONGE 4X4 12PLY STRL (GAUZE/BANDAGES/DRESSINGS) ×2 IMPLANT
GAUZE XEROFORM 1X8 LF (GAUZE/BANDAGES/DRESSINGS) ×2 IMPLANT
GLOVE SRG 8 PF TXTR STRL LF DI (GLOVE) ×1 IMPLANT
GLOVE SURG ENC MOIS LTX SZ7.5 (GLOVE) ×4 IMPLANT
GLOVE SURG ENC MOIS LTX SZ8 (GLOVE) ×4 IMPLANT
GLOVE SURG UNDER LTX SZ8 (GLOVE) ×2 IMPLANT
GLOVE SURG UNDER POLY LF SZ8 (GLOVE) ×2
GOWN STRL REUS W/ TWL LRG LVL3 (GOWN DISPOSABLE) ×1 IMPLANT
GOWN STRL REUS W/ TWL XL LVL3 (GOWN DISPOSABLE) ×1 IMPLANT
GOWN STRL REUS W/TWL LRG LVL3 (GOWN DISPOSABLE) ×2
GOWN STRL REUS W/TWL XL LVL3 (GOWN DISPOSABLE) ×2
GRASPER SUT 15 45D LOW PRO (SUTURE) IMPLANT
IV LACTATED RINGER IRRG 3000ML (IV SOLUTION) ×2
IV LR IRRIG 3000ML ARTHROMATIC (IV SOLUTION) ×2 IMPLANT
KIT CANNULA 8X76-LX IN CANNULA (CANNULA) IMPLANT
MANIFOLD NEPTUNE II (INSTRUMENTS) ×4 IMPLANT
MASK FACE SPIDER DISP (MASK) ×2 IMPLANT
MAT ABSORB  FLUID 56X50 GRAY (MISCELLANEOUS) ×1
MAT ABSORB FLUID 56X50 GRAY (MISCELLANEOUS) ×1 IMPLANT
PACK ARTHROSCOPY SHOULDER (MISCELLANEOUS) ×2 IMPLANT
PAD ABD DERMACEA PRESS 5X9 (GAUZE/BANDAGES/DRESSINGS) ×4 IMPLANT
PASSER SUT FIRSTPASS SELF (INSTRUMENTS) IMPLANT
SLING ARM LRG DEEP (SOFTGOODS) ×1 IMPLANT
SLING ULTRA II LG (MISCELLANEOUS) ×1 IMPLANT
SPONGE T-LAP 18X18 ~~LOC~~+RFID (SPONGE) ×2 IMPLANT
STAPLER SKIN PROX 35W (STAPLE) ×2 IMPLANT
STRAP SAFETY 5IN WIDE (MISCELLANEOUS) ×2 IMPLANT
SUT ETHIBOND 0 MO6 C/R (SUTURE) ×2 IMPLANT
SUT ULTRABRAID 2 COBRAID 38 (SUTURE) IMPLANT
SUT VIC AB 2-0 CT1 27 (SUTURE) ×4
SUT VIC AB 2-0 CT1 TAPERPNT 27 (SUTURE) ×2 IMPLANT
TAPE MICROFOAM 4IN (TAPE) ×2 IMPLANT
TUBING CONNECTING 10 (TUBING) ×2 IMPLANT
TUBING INFLOW SET DBFLO PUMP (TUBING) ×2 IMPLANT
WAND WEREWOLF FLOW 90D (MISCELLANEOUS) ×2 IMPLANT
WATER STERILE IRR 500ML POUR (IV SOLUTION) ×2 IMPLANT

## 2021-10-21 NOTE — Op Note (Signed)
10/21/2021  10:27 AM  Patient:   Wanda Smith  Pre-Op Diagnosis:   Impingement/tendinopathy with possible partial-thickness rotator cuff tear with adhesive capsulitis and biceps tendinitis, right shoulder.  Post-Op Diagnosis:   Impingement/tendinopathy with adhesive capsulitis and biceps tendinitis, right shoulder.  Procedure:   Extensive arthroscopic debridement, arthroscopic subacromial decompression, and mini-open biceps tenodesis, right shoulder.  Anesthesia:   General endotracheal with interscalene block using Exparel placed preoperatively by the anesthesiologist.  Surgeon:   Maryagnes Amos, MD  Assistant:   Griffin Basil, RNFA  Findings:   As above. There was extensive synovitis involving the anterior, superior, posterior and posterior superior aspects of the shoulder. The biceps tendon demonstrated areas of "lip sticking" without any partial or full-thickness tearing. There was some degenerative fraying of the superior portion of the labrum without frank detachment from the glenoid rim. The rotator cuff demonstrated mild articular sided fraying of the supraspinatus insertional fibers without compromise of the footprint. Both the glenoid and humeral articular surfaces were in satisfactory condition.  Prior to manipulation, the right shoulder could be forward flexed to 145 and abducted to 140. At 90 of abduction, the shoulder could be externally rotated to 80 and internally rotated to 60. Following manipulation, the shoulder could be forward flexed to 165, abducted to 160 and, at 90 of abduction, externally rotated to 95 and internally rotated to 70.  Complications:   None  Fluids:   500 cc  Estimated blood loss:   10 cc  Tourniquet time:   None  Drains:   None  Closure:   Staples      Brief clinical note:   The patient is a 61 year old female with a long history of gradually worsening right shoulder pain and stiffness. The patient's symptoms have progressed  despite medications, activity modification, etc. The patient's history and examination are consistent with impingement/tendinopathy with adhesive capsulitis, biceps tendinitis, and a possible rotator cuff tear. These findings were confirmed by MRI scan. The patient presents at this time for definitive management of these shoulder symptoms.  Procedure:   The patient underwent placement of an interscalene block using Exparel by the anesthesiologist in the preoperative holding area before being brought into the operating room and lain in the supine position. The patient then underwent general endotracheal intubation and anesthesia before being repositioned in the beach chair position using the beach chair positioner. A timeout was performed to confirm the proper surgical site before the shoulder was gently manipulated in both abduction and external rotation, as well as adduction and internal rotation. Several palpable and audible pops were heard as the scar tissue released, permitting full range of motion of the shoulder.  The right shoulder and upper extremity were prepped with ChloraPrep solution before being draped sterilely. Preoperative antibiotics were administered. The expected portal sites and incision site were injected with 0.5% Sensorcaine with epinephrine. A posterior portal was created and the glenohumeral joint thoroughly inspected with the findings as described above. An anterior portal was created using an outside-in technique. The labrum and rotator cuff were further probed, again confirming the above-noted findings. The full-radius resector was introduced and used to debride the superior labrum, the frayed articular insertional fibers of the supraspinatus tendon, and much of the significant synovitis inside the joint. The ArthroCare wand was inserted and used to release the biceps tendon from its labral anchor, as well as to thoroughly debride the rotator interval. It also was used to obtain  hemostasis as well as to "anneal" the labrum  superiorly. The instruments were removed from the joint after suctioning the excess fluid.  The camera was repositioned through the posterior portal into the subacromial space. A separate lateral portal was created using an outside-in technique. The 3.5 mm full-radius resector was introduced and used to perform a subtotal bursectomy. The ArthroCare wand was then inserted and used to remove the periosteal tissue off the undersurface of the anterior third of the acromion as well as to recess the coracoacromial ligament from its attachment along the anterior and lateral margins of the acromion. The 4.0 mm acromionizing bur was introduced and used to complete the decompression by removing the undersurface of the anterior third of the acromion. The full radius resector was reintroduced to remove any residual bony debris before the ArthroCare wand was reintroduced to obtain hemostasis. The instruments were then removed from the subacromial space after suctioning the excess fluid.  An approximately 4-5 cm incision was made over the anterolateral aspect of the shoulder beginning at the anterolateral corner of the acromion and extending distally in line with the bicipital groove. This incision was carried down through the subcutaneous tissues to expose the deltoid fascia. The raphae between the anterior and middle thirds was identified and this plane developed to provide access into the subacromial space. Additional bursal tissues were debrided sharply using Metzenbaum scissors. The rotator cuff was carefully inspected and found to be intact. Palpation revealed no areas of thinning of the tendon either.  The bicipital groove was identified by palpation and opened for 1-1.5 cm. The biceps tendon stump was retrieved through this defect. The floor of the bicipital groove was roughened with a curet before a Biomet 2.9 mm JuggerKnot anchor was inserted. Both sets of sutures were  passed through the biceps tendon and tied securely to effect the tenodesis. The bicipital sheath was reapproximated using two #0 Ethibond interrupted sutures, incorporating the biceps tendon to further reinforce the tenodesis.  The wound was copiously irrigated with sterile saline solution before the deltoid raphae was reapproximated using 2-0 Vicryl interrupted sutures. The subcutaneous tissues were closed in two layers using 2-0 Vicryl interrupted sutures before the skin was closed using staples. The portal sites also were closed using staples. A sterile bulky dressing was applied to the shoulder before the arm was placed into a shoulder immobilizer. The patient was then awakened, extubated, and returned to the recovery room in satisfactory condition after tolerating the procedure well.

## 2021-10-21 NOTE — Anesthesia Postprocedure Evaluation (Signed)
Anesthesia Post Note  Patient: [name]  Procedure(s) Performed: SHOULDER ARTHROSCOPY WITH DEBRIDEMENT, DECOMPRESSION, BICEPS TENODESIS. (Right: Shoulder)  Patient location during evaluation: PACU Anesthesia Type: Regional Level of consciousness: awake and alert, oriented and patient cooperative Pain management: pain level controlled Vital Signs Assessment: post-procedure vital signs reviewed and stable Respiratory status: spontaneous breathing, nonlabored ventilation and respiratory function stable Cardiovascular status: blood pressure returned to baseline and stable Postop Assessment: adequate PO intake Anesthetic complications: no   No notable events documented.   Last Vitals:  Vitals:   10/21/21 1117 10/21/21 1131  BP: (!) 140/93 140/89  Pulse: 82 82  Resp: 16 16  Temp: 36.9 C   SpO2: 99% 95%    Last Pain:  Vitals:   10/21/21 1131  TempSrc: Temporal  PainSc: 0-No pain                 Reed Breech

## 2021-10-21 NOTE — H&P (Addendum)
History of Present Illness: Wanda Smith is a 61 y.o. female who presents today for her surgical history and physical for upcoming right shoulder arthroscopy with Dr. Joice Lofts on 10/21/2021. She is to undergo a right shoulder arthroscopic debridement, subacromial decompression, probable biceps tenodesis, and possible rotator cuff repair. The patient denies any changes in her medical history since she was last evaluated. Pain score today is a 3 out of 10 denies any personal history of heart attack or stroke, no history of blood clots. The patient does have a history of COPD, she uses a or as needed. The patient is a diabetic however her blood glucose levels are well controlled.  Past Medical History:  Abdominal hernia 61 Years old   Allergic state   Anxiety   Arthritis (Maybe?)   Asthma without status asthmaticus, unspecified   Carpal tunnel syndrome   Carpal tunnel syndrome, bilateral   Chronic airway obstruction (CMS-HCC) - not elsewhere classified   Chronic kidney disease   Complete rupture of rotator cuff   COPD (chronic obstructive pulmonary disease) (CMS-HCC)   Depression (Sometimes)   Disorders of bursae and tendons in shoulder region, unspecified   Essential hypertension, benign   Fatty liver   GERD (gastroesophageal reflux disease)   Graves disease 2000 (s/p I-131 ablation)   Osteoporosis, post-menopausal (Maybe?)   Other and unspecified hyperlipidemia   Type II or unspecified type diabetes mellitus without mention of complication, not stated as uncontrolled (CMS-HCC)   Unspecified hypothyroidism   Past Surgical History:  TUBAL LIGATION 1993   HYSTERECTOMY 1998 (partial)   Left rotator cuff repair 02/11   CHOLECYSTECTOMY 11/2011 (for cholelithiasis)   EGD 03/03/2012 (No repeat per RTE)   COLONOSCOPY 03/03/2012 (Adenomatous Polyps: CBF 02/2017; Recall Ltr mailed 01/07/2017)   COLONOSCOPY 01/13/2018 [facility] (Father) Hyperplastic Polyp: CBF 01/2023)   ENDOSCOPIC CARPAL TUNNEL  RELEASE Right 07/26/2018   HERNIA REPAIR as a child   Past Family History:  Breast cancer Mother   Asthma Mother   Hyperlipidemia (Elevated cholesterol) Mother   High blood pressure (Hypertension) Mother   Kidney disease Mother   Dementia Mother   Arthritis Mother   Diabetes type II Father - Deceased   Hyperlipidemia Father - Deceased   Pancreatic cancer Father   Coronary Artery Disease Father - Deceased   Cancer Father (pancreatic)   Colon polyps Father   Heart disease Father   Gout Father   Diabetes type II Sister   Diabetes type II Brother   Thyroid disease Brother   Breast cancer Sister   Breast cancer Sister   Diabetes type II Sister   Diabetes type II Brother   Thyroid disease Brother   Colon cancer Neg Hx   Medications:  acetaminophen (TYLENOL) 500 MG tablet Take 1,500 mg by mouth as needed for Pain   albuterol 90 mcg/actuation inhaler Inhale 2 inhalations into the lungs every 4 (four) hours 3 each 3   citalopram (CELEXA) 40 MG tablet Take 1 tablet (40 mg total) by mouth once daily 30 tablet 2   CRESTOR 10 mg tablet Take 1 tablet (10 mg total) by mouth once daily 90 tablet 3   diclofenac (VOLTAREN) 1 % topical gel Apply 2 g topically 4 (four) times daily as needed (pain) 100 g 0   FARXIGA 10 mg tablet Take 1 tablet by mouth once daily   fluticasone propion-salmeteroL (ADVAIR DISKUS) 250-50 mcg/dose diskus inhaler Inhale 1 inhalation into the lungs 2 (two) times daily 3 each 3   fluticasone propionate (  FLONASE) 50 mcg/actuation nasal spray Place 2 sprays into both nostrils once daily 16 g 2   ibuprofen (MOTRIN) 200 MG tablet Take 200 mg by mouth as needed for Pain   levothyroxine (SYNTHROID) 88 MCG tablet Take 1 tablet (88 mcg total) by mouth once daily Take on an empty stomach with a glass of water at least 30-60 minutes before breakfast. 90 tablet 0   lisinopriL (ZESTRIL) 40 MG tablet Take 1 tablet (40 mg total) by mouth once daily 90 tablet 3   metFORMIN (GLUCOPHAGE)  1000 MG tablet Take 1 tablet (1,000 mg total) by mouth 2 (two) times daily 180 tablet 3   montelukast (SINGULAIR) 10 mg tablet Take 1 tablet (10 mg total) by mouth once daily 60 tablet 2   HYDROcodone-acetaminophen (NORCO) 5-325 mg tablet Take 1 tablet by mouth every 4 (four) hours as needed for Pain for up to 20 doses (Patient not taking: Reported on 09/28/2021) 20 tablet 0   naproxen sodium (ALEVE) 220 MG tablet Take 1 tablet (220 mg total) by mouth as needed for Pain (Patient not taking: Reported on 09/28/2021)   promethazine-dextromethorphan (PROMETHAZINE-DM) 6.25-15 mg/5 mL syrup Take 5 mLs by mouth every 6 (six) hours as needed for Cough (Patient not taking: Reported on 09/28/2021) 120 mL 0   rosuvastatin (CRESTOR) 10 MG tablet Take 1 tablet (10 mg total) by mouth once daily (Patient not taking: Reported on 09/28/2021) 90 tablet 3   Allergies:  Pravastatin Other (Abnormal Liver Function Studies)   Ultram [Tramadol] Other (GI Upset)   Review of Systems:  A comprehensive 14 point ROS was performed, reviewed by me today, and the pertinent orthopaedic findings are documented in the HPI.  Physical Exam: BP 122/80   Ht 162.6 cm (5\' 4" )   Wt 65.9 kg (145 lb 3.2 oz)   BMI 24.92 kg/m  General/Constitutional: The patient appears to be well-nourished, well-developed, and in no acute distress. Neuro/Psych: Normal mood and affect, oriented to person, place and time. Eyes: Non-icteric. Pupils are equal, round, and reactive to light, and exhibit synchronous movement. ENT: Unremarkable. Lymphatic: No palpable adenopathy. Respiratory: Lungs clear to auscultation, Normal chest excursion, No wheezes and Non-labored breathing Cardiovascular: Regular rate and rhythm. No murmurs. and No edema, swelling or tenderness, except as noted in detailed exam. Integumentary: No impressive skin lesions present, except as noted in detailed exam. Musculoskeletal: Unremarkable, except as noted in detailed  exam.  General: Well developed, well nourished 61 y.o. female in no apparent distress. Normal affect. Normal communication. Patient answers questions appropriately. The patient has a normal gait. There is no antalgic component. There is no hip lurch.   The patient has full cervical flexion and extension. Full left and right bend with rotation with at most minimal pain. The patient does report some discomfort palpation along the superior aspect of the right scapula. The patient has a negative Spurling's test. Negative overhead test for radiculopathy symptoms.  Righ Upper Extremity: Examination of the right shoulder and arm showed no bony abnormality or edema. Actively the patient is able to achieve 180 degrees forward flexion, with abduction she is able to achieve 180 degrees. The patient with the right arm abducted 90 degrees and tolerate external rotation 80 degrees, internal rotation 70 degrees. The patient does have full passive range of motion to the right shoulder at this time. Mildly positive Hawkins and mildly positive impingement test. The patient has a negative drop arm test. Mild subacromial space tenderness, moderate tenderness with palpation of the  right proximal bicep tendon. Negative speeds test today. The patient has no instability of the shoulder with anterior-posterior motion. There is a negative sulcus sign. The rotator cuff muscle strength is 4/5 with supraspinatus, 5/5 with internal rotation, and 5/5 with external rotation. There is no crepitus with range of motion activities.   Neurological: The patient has sensation that is intact to light touch and pinprick bilaterally. The patient has normal grip strength. The patient has full biceps, wrist extension, grip, and interosseous strength. The patient has 2 + DTRs bilaterally. The patient has a negative Phalen's test and negative Tinel's test of the right hand. The patient has a negative Tinel's test over the cubital  tunnel.  Vascular: The patient has less than 2 second capillary refill. The patient has normal ulnar and radial pulses. The patient has normal warmth to touch.   Imaging: True AP, Y-scapular, and axillary views of the right shoulder were obtained at a previous visit and reviewed today. These films demonstrate no evidence for fractures, lytic lesions, or significant degenerative changes. The subacromial space is mildly decreased. There is no subacromial or infra-clavicular spurring. She demonstrates a Type II acromion. There does appear to be moderate downsloping of the lateral acromion, indicative of possible underlying impingement syndrome.  MRI OF THE RIGHT SHOULDER WITHOUT CONTRAST:  1. Mild tendinosis of the supraspinatus tendon with a small  partial-thickness tear along the bursal surface.  2. Mild tendinosis of the intra-articular portion of the long head  of the biceps tendon.   Impression: 1. Right rotator cuff tendonitis. 2. Adhesive capsulitis of right shoulder. 3. Biceps tendinitis of right upper extremity.  Plan:  1. Treatment options were discussed today with the patient. 2. The patient scheduled for a right show arthroscopy with debridement, decompression, probable biceps tenodesis and probable rotator cuff repair. Surgery scheduled Dr. Joice Lofts on 10/21/2021. 3. The patient was instructed on the risk and benefits of a right shoulder arthroscopy and would like to proceed with surgery at this time. 4. She was offered and received a right shoulder sling shot shoulder sling that she will need to wear following surgery. 5. This document will serve as the patient's history and physical. 6. She will follow-up per standard postop protocol. She can contact the clinic if she has any questions, new symptoms develop or symptoms worsen.  The procedure was discussed with the patient, as were the potential risks (including bleeding, infection, nerve and/or blood vessel injury, persistent or  recurrent pain, failure of the repair, progression of arthritis, need for further surgery, blood clots, strokes, heart attacks and/or arhythmias, pneumonia, etc.) and benefits. The patient states her understanding and wishes to proceed.   H&P reviewed and patient re-examined. No changes.

## 2021-10-21 NOTE — Anesthesia Procedure Notes (Signed)
Procedure Name: Intubation Date/Time: 10/21/2021 8:31 AM Performed by: Kelton Pillar, CRNA Pre-anesthesia Checklist: Patient identified, Emergency Drugs available, Suction available and Patient being monitored Patient Re-evaluated:Patient Re-evaluated prior to induction Oxygen Delivery Method: Circle system utilized Preoxygenation: Pre-oxygenation with 100% oxygen Induction Type: IV induction Ventilation: Mask ventilation without difficulty Laryngoscope Size: McGraph and 3 Grade View: Grade I Tube type: Oral Tube size: 6.5 mm Number of attempts: 1 Airway Equipment and Method: Stylet and Oral airway Placement Confirmation: ETT inserted through vocal cords under direct vision, positive ETCO2, breath sounds checked- equal and bilateral and CO2 detector Secured at: 21 cm Tube secured with: Tape Dental Injury: Teeth and Oropharynx as per pre-operative assessment

## 2021-10-21 NOTE — Discharge Instructions (Addendum)
Orthopedic discharge instructions: Keep dressing dry and intact.  May shower after dressing changed on post-op day #4 (Sunday).  Cover staples with Band-Aids after drying off. Apply ice frequently to shoulder. Take ibuprofen 600-800 mg TID with meals for 5-7 days, then as necessary. Take oxycodone as prescribed when needed.  May supplement with ES Tylenol if necessary. Keep shoulder immobilizer on at all times except may remove for bathing purposes. Follow-up in 10-14 days or as scheduled.AMBULATORY SURGERY  DISCHARGE INSTRUCTIONS   The drugs that you were given will stay in your system until tomorrow so for the next 24 hours you should not:  Drive an automobile Make any legal decisions Drink any alcoholic beverage   You may resume regular meals tomorrow.  Today it is better to start with liquids and gradually work up to solid foods.  You may eat anything you prefer, but it is better to start with liquids, then soup and crackers, and gradually work up to solid foods.   Please notify your doctor immediately if you have any unusual bleeding, trouble breathing, redness and pain at the surgery site, drainage, fever, or pain not relieved by medication    Your post-operative visit with Dr.                                       is: Date:      02 /27/2023                  Time:  0845  Please call to schedule your post-operative visit.  Additional Instructions:   Information for Discharge Teaching:  DO NOT REMOVE TEAL EXPAREL BRACELET FOR 4 DAYS (96 hours) 10/25/2021 EXPAREL (bupivacaine liposome injectable suspension)   Your surgeon or anesthesiologist gave you EXPAREL(bupivacaine) to help control your pain after surgery.  EXPAREL is a local anesthetic that provides pain relief by numbing the tissue around the surgical site. EXPAREL is designed to release pain medication over time and can control pain for up to 72 hours. Depending on how you respond to EXPAREL, you may require less  pain medication during your recovery.  Possible side effects: Temporary loss of sensation or ability to move in the area where bupivacaine was injected. Nausea, vomiting, constipation Rarely, numbness and tingling in your mouth or lips, lightheadedness, or anxiety may occur. Call your doctor right away if you think you may be experiencing any of these sensations, or if you have other questions regarding possible side effects.  Follow all other discharge instructions given to you by your surgeon or nurse. Eat a healthy diet and drink plenty of water or other fluids.  If you return to the hospital for any reason within 96 hours following the administration of EXPAREL, it is important for health care providers to know that you have received this anesthetic. A teal colored band has been placed on your arm with the date, time and amount of EXPAREL you have received in order to alert and inform your health care providers. Please leave this armband in place for the full 96 hours following administration, and then you may remove the band.

## 2021-10-21 NOTE — Anesthesia Procedure Notes (Signed)
Anesthesia Regional Block: Interscalene brachial plexus block   Pre-Anesthetic Checklist: , timeout performed,  Correct Patient, Correct Site, Correct Laterality,  Correct Procedure,, site marked,  Risks and benefits discussed,  Surgical consent,  Pre-op evaluation,  At surgeon's request and post-op pain management  Laterality: Right  Prep: chloraprep       Needles:  Injection technique: Single-shot  Needle Type: Echogenic Needle          Additional Needles:   Procedures:,,,, ultrasound used (permanent image in chart),,   Motor weakness within 20 minutes.  Narrative:  Start time: 10/21/2021 7:54 AM End time: 10/21/2021 7:57 AM Injection made incrementally with aspirations every 5 mL.  Performed by: Personally  Anesthesiologist: Reed Breech, MD  Additional Notes: Functioning IV was confirmed and monitors applied.  Sterile prep and drape, hand hygiene and sterile gloves were used. Ultrasound guidance: relevant anatomy identified, needle position confirmed, local anesthetic spread visualized around nerve(s), vascular puncture avoided.  Image saved to electronic medical record.  Negative aspiration prior to incremental administration of local anesthetic for total 20 ml Exparel and 10 ml bupivacaine 0.5% given in interscalene distribution. The patient tolerated the procedure well. Vital signs and moderate sedation medications recorded in RN notes.

## 2021-10-21 NOTE — Anesthesia Preprocedure Evaluation (Addendum)
Anesthesia Evaluation  Patient identified by MRN, date of birth, ID band Patient awake    Reviewed: Allergy & Precautions, NPO status , Patient's Chart, lab work & pertinent test results  History of Anesthesia Complications Negative for: history of anesthetic complications  Airway Mallampati: IV   Neck ROM: Full    Dental   Upper partial; lower molars missing:   Pulmonary asthma , COPD, former smoker (quit 2009),    Pulmonary exam normal breath sounds clear to auscultation       Cardiovascular hypertension, Normal cardiovascular exam Rhythm:Regular Rate:Normal  ECG 10/15/21: normal   Neuro/Psych PSYCHIATRIC DISORDERS Anxiety Depression negative neurological ROS     GI/Hepatic negative GI ROS,   Endo/Other  diabetes, Type 2Hypothyroidism   Renal/GU Renal disease (stage III CKD)     Musculoskeletal  (+) Arthritis ,   Abdominal   Peds  Hematology negative hematology ROS (+)   Anesthesia Other Findings   Reproductive/Obstetrics                            Anesthesia Physical Anesthesia Plan  ASA: 2  Anesthesia Plan: General and Regional   Post-op Pain Management: Regional block*   Induction: Intravenous  PONV Risk Score and Plan: 3 and Ondansetron, Dexamethasone and Treatment may vary due to age or medical condition  Airway Management Planned: Oral ETT  Additional Equipment:   Intra-op Plan:   Post-operative Plan: Extubation in OR  Informed Consent: I have reviewed the patients History and Physical, chart, labs and discussed the procedure including the risks, benefits and alternatives for the proposed anesthesia with the patient or authorized representative who has indicated his/her understanding and acceptance.     Dental advisory given  Plan Discussed with: CRNA  Anesthesia Plan Comments: (Plan for preoperative interscalene nerve block and GETA.  Patient consented for risks  of anesthesia including but not limited to:  - adverse reactions to medications - damage to eyes, teeth, lips or other oral mucosa - nerve damage due to positioning  - sore throat or hoarseness - damage to heart, brain, nerves, lungs, other parts of body or loss of life  Informed patient about role of CRNA in peri- and intra-operative care.  Patient voiced understanding.)        Anesthesia Quick Evaluation

## 2021-10-21 NOTE — Transfer of Care (Signed)
Immediate Anesthesia Transfer of Care Note  Patient: Wanda Smith  Procedure(s) Performed: SHOULDER ARTHROSCOPY WITH DEBRIDEMENT, DECOMPRESSION, BICEPS TENODESIS. (Right: Shoulder)  Patient Location: PACU  Anesthesia Type:General  Level of Consciousness: awake, drowsy and patient cooperative  Airway & Oxygen Therapy: Patient Spontanous Breathing and Patient connected to face mask oxygen  Post-op Assessment: Report given to RN and Post -op Vital signs reviewed and stable  Post vital signs: Reviewed and stable  Last Vitals:  Vitals Value Taken Time  BP 155/100 10/21/21 1006  Temp    Pulse 75 10/21/21 1010  Resp 14 10/21/21 1010  SpO2 100 % 10/21/21 1010  Vitals shown include unvalidated device data.  Last Pain:  Vitals:   10/21/21 0726  PainSc: 0-No pain         Complications: No notable events documented.

## 2021-11-02 ENCOUNTER — Other Ambulatory Visit: Payer: Self-pay

## 2021-11-02 ENCOUNTER — Ambulatory Visit
Admission: RE | Admit: 2021-11-02 | Discharge: 2021-11-02 | Disposition: A | Discharge status: Home / Self Care | Payer: BC Managed Care – PPO | Source: Ambulatory Visit | Attending: Physician Assistant | Admitting: Physician Assistant

## 2021-11-02 DIAGNOSIS — N6310 Unspecified lump in the right breast, unspecified quadrant: Secondary | ICD-10-CM | POA: Insufficient documentation

## 2021-11-13 ENCOUNTER — Encounter: Payer: Self-pay | Admitting: Surgery

## 2022-02-23 HISTORY — PX: COLONOSCOPY WITH ESOPHAGOGASTRODUODENOSCOPY (EGD): SHX5779

## 2022-04-25 ENCOUNTER — Ambulatory Visit
Admission: RE | Admit: 2022-04-25 | Discharge: 2022-04-25 | Disposition: A | Discharge status: Home / Self Care | Payer: BC Managed Care – PPO | Source: Ambulatory Visit | Attending: Internal Medicine | Admitting: Internal Medicine

## 2022-04-25 VITALS — BP 126/78 | HR 50 | Temp 98.4°F | Resp 16 | Ht 64.0 in | Wt 147.0 lb

## 2022-04-25 DIAGNOSIS — H60392 Other infective otitis externa, left ear: Secondary | ICD-10-CM | POA: Diagnosis not present

## 2022-04-25 DIAGNOSIS — H9202 Otalgia, left ear: Secondary | ICD-10-CM | POA: Diagnosis not present

## 2022-04-25 MED ORDER — NEOMYCIN-POLYMYXIN-HC 3.5-10000-1 OT SUSP: 4.0000 [drp] | Freq: Three times a day (TID) | OTIC | 0 refills | Status: DC

## 2022-04-25 NOTE — ED Provider Notes (Signed)
[facility]    CSN: [id number] Arrival date & time: 04/25/22  1041      History   Chief Complaint Chief Complaint  Patient presents with   Ear Problem   Headache    HPI Wanda Smith is a 61 y.o. female who presents with  L ear pain that radiates to L side of head above her ear since yesterday. She took Tylenol which helped the pain, but did not helped, but came back when it wore off. Has not had a fever or drainage or sinus pain or dental issues. Has noticed mild post nasal drainage today.      Past Medical History:  Diagnosis Date   Anemia    Anxiety    Arthritis    knee (R)   Asthma    Chronic kidney disease    Stage 3 sees Lateef   COPD (chronic obstructive pulmonary disease) (HCC)    Depression    Diabetes mellitus without complication (HCC)    Dyspnea    GERD (gastroesophageal reflux disease)    occ   Hypertension    Hypothyroidism    Increased serum lipids    Wears contact lenses     There are no problems to display for this patient.   Past Surgical History:  Procedure Laterality Date   ABDOMINAL HYSTERECTOMY     CARPAL TUNNEL RELEASE Right 07/26/2018   Procedure: CARPAL TUNNEL RELEASE ENDOSCOPIC;  Surgeon: Corky Mull, MD;  Location: [place];  Service: Orthopedics;  Laterality: Right;  diabetic - oral meds   CHOLECYSTECTOMY     COLONOSCOPY WITH ESOPHAGOGASTRODUODENOSCOPY (EGD)     COLONOSCOPY WITH PROPOFOL N/A 01/13/2018   Procedure: COLONOSCOPY WITH PROPOFOL;  Surgeon: Manya Silvas, MD;  Location: [facility];  Service: Endoscopy;  Laterality: N/A;   HERNIA REPAIR     left rotator cuff repair Left    SHOULDER ARTHROSCOPY WITH SUBACROMIAL DECOMPRESSION, ROTATOR CUFF REPAIR AND BICEP TENDON REPAIR Right 10/21/2021   Procedure: SHOULDER ARTHROSCOPY WITH DEBRIDEMENT, DECOMPRESSION, BICEPS TENODESIS.;  Surgeon: Corky Mull, MD;  Location: [facility];  Service: Orthopedics;  Laterality: Right;   TUBAL LIGATION       OB History   No obstetric history on file.      Home Medications    Prior to Admission medications   Medication Sig Start Date End Date Taking? Authorizing Provider  albuterol (PROVENTIL HFA;VENTOLIN HFA) 108 (90 Base) MCG/ACT inhaler Inhale 1-2 puffs into the lungs every 6 (six) hours as needed for wheezing or shortness of breath.   Yes [provider]  calcium carbonate (TUMS - DOSED IN MG ELEMENTAL CALCIUM) 500 MG chewable tablet Chew 1 tablet by mouth as needed for indigestion or heartburn.   Yes [provider]  citalopram (CELEXA) 40 MG tablet Take 40 mg by mouth every evening. 02/13/18  Yes [provider]  dapagliflozin propanediol (FARXIGA) 10 MG TABS tablet Take 10 mg by mouth every morning.   Yes [provider]  ferrous sulfate 325 (65 FE) MG tablet Take 325 mg by mouth daily with breakfast.   Yes [provider]  Fluticasone-Salmeterol (ADVAIR) 250-50 MCG/DOSE AEPB Inhale 1 puff into the lungs 2 (two) times daily as needed (shortness of breath).   Yes [provider]  levothyroxine (SYNTHROID) 88 MCG tablet Take 88 mcg by mouth daily before breakfast.   Yes [provider]  lisinopril (PRINIVIL,ZESTRIL) 40 MG tablet Take 40 mg by mouth every morning.   Yes  [provider]  metFORMIN (GLUCOPHAGE) 1000 MG tablet Take 1,000 mg by mouth 2 (two) times daily with a meal.   Yes [provider]  montelukast (SINGULAIR) 10 MG tablet Take 10 mg by mouth at bedtime.   Yes [provider]  neomycin-polymyxin-hydrocortisone (CORTISPORIN) 3.5-10000-1 OTIC suspension Place 4 drops into the left ear 3 (three) times daily. For 7 days 04/25/22  Yes Rodriguez-Southworth, Nettie Elm, PA-C  rosuvastatin (CRESTOR) 10 MG tablet Take 10 mg by mouth at bedtime.   Yes [provider]    Family History Family History  Problem Relation Age of Onset   Breast cancer Mother 62   Breast cancer Sister 52    Diabetes Sister    Diabetes Father    Diabetes Brother     Social History Social History   Tobacco Use   Smoking status: Former    Packs/day: 0.75    Years: 20.00    Total pack years: 15.00    Types: Cigarettes    Quit date: 09/07/2007    Years since quitting: 14.6   Smokeless tobacco: Never  Vaping Use   Vaping Use: Never used  Substance Use Topics   Alcohol use: Yes    Alcohol/week: 2.0 standard drinks of alcohol    Types: 2 Standard drinks or equivalent per week    Comment: occasionally (4x/yr)   Drug use: Never     Allergies   Pravastatin and Tramadol   Review of Systems Review of Systems  Constitutional:  Negative for fever.  HENT:  Positive for ear pain. Negative for dental problem and ear discharge.   Skin:  Negative for rash and wound.  Neurological:  Positive for headaches.       Denies hyperalgesia on L scalp area  Hematological:  Negative for adenopathy.     Physical Exam Triage Vital Signs ED Triage Vitals  Enc Vitals Group     BP 04/25/22 1053 126/78     Pulse Rate 04/25/22 1053 (!) 50     Resp 04/25/22 1053 16     Temp 04/25/22 1053 98.4 F (36.9 C)     Temp Source 04/25/22 1053 Oral     SpO2 04/25/22 1053 97 %     Weight 04/25/22 1052 147 lb (66.7 kg)     Height 04/25/22 1052 5\' 4"  (1.626 m)     Head Circumference --      Peak Flow --      Pain Score 04/25/22 1052 6     Pain Loc --      Pain Edu? --      Excl. in GC? --    No data found.  Updated Vital Signs BP 126/78 (BP Location: Left Arm)   Pulse (!) 50   Temp 98.4 F (36.9 C) (Oral)   Resp 16   Ht 5\' 4"  (1.626 m)   Wt 147 lb (66.7 kg)   SpO2 97%   BMI 25.23 kg/m   Visual Acuity Right Eye Distance:   Left Eye Distance:   Bilateral Distance:    Right Eye Near:   Left Eye Near:    Bilateral Near:     Physical Exam Vitals reviewed.  Constitutional:      General: She is not in acute distress.    Appearance: She is normal weight. She is not toxic-appearing.  HENT:      Head: Normocephalic.     Comments: Has non tender and soft temporal artery. Has local tenderness above L ear, but  this area shows no rashes or other abnormalities    Right Ear: Tympanic membrane, ear canal and external ear normal.     Left Ear: Tympanic membrane and external ear normal.     Ears:     Comments: Has pain in ear canal with external ear motion    Nose: Nose normal.     Mouth/Throat:     Comments: Does not have any TMJ pain Eyes:     General: No scleral icterus.    Conjunctiva/sclera: Conjunctivae normal.  Pulmonary:     Effort: Pulmonary effort is normal.  Musculoskeletal:        General: Normal range of motion.     Cervical back: Neck supple.  Lymphadenopathy:     Cervical: No cervical adenopathy.  Skin:    General: Skin is warm and dry.     Findings: No rash.  Neurological:     Mental Status: She is alert and oriented to person, place, and time.     Gait: Gait normal.  Psychiatric:        Mood and Affect: Mood normal.        Behavior: Behavior normal.        Thought Content: Thought content normal.        Judgment: Judgment normal.      UC Treatments / Results  Labs (all labs ordered are listed, but only abnormal results are displayed) Labs Reviewed - No data to display  EKG   Radiology No results found.  Procedures Procedures (including critical care time)  Medications Ordered in UC Medications - No data to display  Initial Impression / Assessment and Plan / UC Course  I have reviewed the triage vital signs and the nursing notes.  L Otalgia radiating to scalp with no sign of rash or abscess L OE  I placed her on Cortisporin otic gtts as noted.   Final Clinical Impressions(s) / UC Diagnoses   Final diagnoses:  Otalgia of left ear  Infective otitis externa of left ear     Discharge Instructions      You may continue the Tylenol for the headache     ED Prescriptions     Medication Sig Dispense Auth. Provider    neomycin-polymyxin-hydrocortisone (CORTISPORIN) 3.5-10000-1 OTIC suspension Place 4 drops into the left ear 3 (three) times daily. For 7 days 10 mL Rodriguez-Southworth, Nettie Elm, PA-C      PDMP not reviewed this encounter.   Garey Ham, PA-C 04/25/22 1125

## 2022-04-25 NOTE — ED Triage Notes (Signed)
Pt c/o left side headache and ear pain x1day.  Pt denies any issues with ear wax and states that she did not put anything into her ear.

## 2022-04-25 NOTE — Discharge Instructions (Signed)
You may continue the Tylenol for the headache

## 2022-10-06 ENCOUNTER — Other Ambulatory Visit: Payer: Self-pay | Admitting: Physician Assistant

## 2022-10-06 DIAGNOSIS — Z1231 Encounter for screening mammogram for malignant neoplasm of breast: Secondary | ICD-10-CM

## 2022-10-20 ENCOUNTER — Other Ambulatory Visit: Payer: Self-pay | Admitting: Student

## 2022-10-20 DIAGNOSIS — G8929 Other chronic pain: Secondary | ICD-10-CM

## 2022-10-20 DIAGNOSIS — M25862 Other specified joint disorders, left knee: Secondary | ICD-10-CM

## 2022-10-20 DIAGNOSIS — M25562 Pain in left knee: Secondary | ICD-10-CM

## 2022-10-20 DIAGNOSIS — M170 Bilateral primary osteoarthritis of knee: Secondary | ICD-10-CM

## 2022-10-21 ENCOUNTER — Encounter: Payer: Self-pay | Admitting: Student

## 2022-11-01 ENCOUNTER — Ambulatory Visit
Admission: RE | Admit: 2022-11-01 | Discharge: 2022-11-01 | Disposition: A | Discharge status: Home / Self Care | Source: Ambulatory Visit | Attending: Student

## 2022-11-01 ENCOUNTER — Ambulatory Visit
Admission: RE | Admit: 2022-11-01 | Discharge: 2022-11-01 | Disposition: A | Discharge status: Home / Self Care | Source: Ambulatory Visit | Attending: Student | Admitting: Student

## 2022-11-01 DIAGNOSIS — M25562 Pain in left knee: Secondary | ICD-10-CM

## 2022-11-01 DIAGNOSIS — M170 Bilateral primary osteoarthritis of knee: Secondary | ICD-10-CM

## 2022-11-01 DIAGNOSIS — G8929 Other chronic pain: Secondary | ICD-10-CM

## 2022-11-01 DIAGNOSIS — M25862 Other specified joint disorders, left knee: Secondary | ICD-10-CM

## 2022-11-23 ENCOUNTER — Ambulatory Visit
Admission: RE | Admit: 2022-11-23 | Discharge: 2022-11-23 | Disposition: A | Discharge status: Home / Self Care | Payer: No Typology Code available for payment source | Source: Ambulatory Visit | Attending: Physician Assistant | Admitting: Physician Assistant

## 2022-11-23 DIAGNOSIS — Z1231 Encounter for screening mammogram for malignant neoplasm of breast: Secondary | ICD-10-CM | POA: Insufficient documentation

## 2023-03-27 ENCOUNTER — Ambulatory Visit (INDEPENDENT_AMBULATORY_CARE_PROVIDER_SITE_OTHER): Payer: 59

## 2023-03-27 ENCOUNTER — Ambulatory Visit
Admission: RE | Admit: 2023-03-27 | Discharge: 2023-03-27 | Disposition: A | Discharge status: Home / Self Care | Payer: 59 | Source: Ambulatory Visit | Attending: Internal Medicine | Admitting: Internal Medicine

## 2023-03-27 VITALS — BP 108/72 | HR 57 | Temp 98.4°F | Resp 16 | Ht 64.0 in | Wt 147.0 lb

## 2023-03-27 DIAGNOSIS — M25441 Effusion, right hand: Secondary | ICD-10-CM | POA: Diagnosis not present

## 2023-03-27 DIAGNOSIS — M79641 Pain in right hand: Secondary | ICD-10-CM | POA: Diagnosis not present

## 2023-03-27 DIAGNOSIS — M25541 Pain in joints of right hand: Secondary | ICD-10-CM | POA: Diagnosis not present

## 2023-03-27 LAB — URIC ACID: Uric Acid, Serum: 5.7 mg/dL (ref 2.5–7.1)

## 2023-03-27 MED ORDER — PREDNISONE 10 MG PO TABS: ORAL_TABLET | ORAL | 0 refills | Status: DC

## 2023-03-27 NOTE — ED Provider Notes (Signed)
[facility]    CSN: [id number] Arrival date & time: 03/27/23  1230      History   Chief Complaint Chief Complaint  Patient presents with   finger problem    HPI Wanda Smith is a 62 y.o. female with a history of CKD, COPD, DM 2, HTN, hypothyroidism presents urgent care today with complaint of pain, redness and swelling at the base of her index finger on her right hand.  She noticed this yesterday.  She describes the pain as throbbing.  The pain is worse when she tries to bend her finger.  She reports the area is red and warm to touch.  She denies any injury to the area.  She has no history of gout.  She reports she had a small scratch in that area that she sustained about 1 week ago that seem to be healing just fine.  She has tried Tylenol OTC with minimal relief of symptoms.  HPI  Past Medical History:  Diagnosis Date   Anemia    Anxiety    Arthritis    knee (R)   Asthma    Chronic kidney disease    Stage 3 sees Lateef   COPD (chronic obstructive pulmonary disease) (HCC)    Depression    Diabetes mellitus without complication (HCC)    Dyspnea    GERD (gastroesophageal reflux disease)    occ   Hypertension    Hypothyroidism    Increased serum lipids    Wears contact lenses     There are no problems to display for this patient.   Past Surgical History:  Procedure Laterality Date   ABDOMINAL HYSTERECTOMY     CARPAL TUNNEL RELEASE Right 07/26/2018   Procedure: CARPAL TUNNEL RELEASE ENDOSCOPIC;  Surgeon: Christena Flake, MD;  Location: [facility];  Service: Orthopedics;  Laterality: Right;  diabetic - oral meds   CHOLECYSTECTOMY     COLONOSCOPY WITH ESOPHAGOGASTRODUODENOSCOPY (EGD)     COLONOSCOPY WITH PROPOFOL N/A 01/13/2018   Procedure: COLONOSCOPY WITH PROPOFOL;  Surgeon: Scot Jun, MD;  Location: [facility] END[facility]COPY;  Service: Endoscopy;  Laterality: N/A;   HERNIA REPAIR     left rotator cuff repair Left    SHOULDER ARTHROSCOPY WITH  SUBACROMIAL DECOMPRESSION, ROTATOR CUFF REPAIR AND BICEP TENDON REPAIR Right 10/21/2021   Procedure: SHOULDER ARTHROSCOPY WITH DEBRIDEMENT, DECOMPRESSION, BICEPS TENODESIS.;  Surgeon: Christena Flake, MD;  Location: [facility];  Service: Orthopedics;  Laterality: Right;   TUBAL LIGATION      OB History   No obstetric history on file.      Home Medications    Prior to Admission medications   Medication Sig Start Date End Date Taking? Authorizing Provider  citalopram (CELEXA) 40 MG tablet Take 40 mg by mouth every evening. 02/13/18  Yes [provider]  dapagliflozin propanediol (FARXIGA) 10 MG TABS tablet Take 10 mg by mouth every morning.   Yes [provider]  ferrous sulfate 325 (65 FE) MG tablet Take 325 mg by mouth daily with breakfast.   Yes [provider]  Fluticasone-Salmeterol (ADVAIR) 250-50 MCG/DOSE AEPB Inhale 1 puff into the lungs 2 (two) times daily as needed (shortness of breath).   Yes [provider]  levothyroxine (SYNTHROID) 88 MCG tablet Take 88 mcg by mouth daily before breakfast.   Yes [provider]  lisinopril (PRINIVIL,ZESTRIL) 40 MG tablet Take 40 mg by mouth every morning.   Yes [provider]  metFORMIN (GLUCOPHAGE) 1000 MG tablet  Take 1,000 mg by mouth 2 (two) times daily with a meal.   Yes [provider]  montelukast (SINGULAIR) 10 MG tablet Take 10 mg by mouth at bedtime.   Yes [provider]  predniSONE (DELTASONE) 10 MG tablet Take 6 tabs on day 1, 5 tabs on day 2, 4 tabs on day 3, 3 tabs on day 4, 2 tabs on day 5, 1 tab on day 6 03/27/23  Yes Karimah Winquist, Salvadore Oxford, NP  rosuvastatin (CRESTOR) 10 MG tablet Take 10 mg by mouth at bedtime.   Yes [provider]  albuterol (PROVENTIL HFA;VENTOLIN HFA) 108 (90 Base) MCG/ACT inhaler Inhale 1-2 puffs into the lungs every 6 (six) hours as needed for wheezing or shortness of breath.    [provider]  calcium carbonate (TUMS - DOSED IN  MG ELEMENTAL CALCIUM) 500 MG chewable tablet Chew 1 tablet by mouth as needed for indigestion or heartburn.    [provider]  neomycin-polymyxin-hydrocortisone (CORTISPORIN) 3.5-10000-1 OTIC suspension Place 4 drops into the left ear 3 (three) times daily. For 7 days 04/25/22   Rodriguez-Southworth, Nettie Elm, PA-C    Family History Family History  Problem Relation Age of Onset   Breast cancer Mother 28   Breast cancer Sister 58   Diabetes Sister    Diabetes Father    Diabetes Brother     Social History Social History   Tobacco Use   Smoking status: Former    Current packs/day: 0.00    Average packs/day: 0.8 packs/day for 20.0 years (15.0 ttl pk-yrs)    Types: Cigarettes    Start date: 09/07/1987    Quit date: 09/07/2007    Years since quitting: 15.5   Smokeless tobacco: Never  Vaping Use   Vaping status: Never Used  Substance Use Topics   Alcohol use: Yes    Alcohol/week: 2.0 standard drinks of alcohol    Types: 2 Standard drinks or equivalent per week    Comment: occasionally (4x/yr)   Drug use: Never     Allergies   Pravastatin and Tramadol   Review of Systems Review of Systems  Constitutional:  Negative for chills and fever.  Musculoskeletal:  Positive for arthralgias and joint swelling.       Pain and swelling of the right index finger  Skin:  Positive for color change.       Redness and warmth report of the right index finger.  Neurological:  Negative for weakness.     Physical Exam Triage Vital Signs ED Triage Vitals  Encounter Vitals Group     BP 03/27/23 1246 108/72     Systolic BP Percentile --      Diastolic BP Percentile --      Pulse Rate 03/27/23 1246 (!) 57     Resp 03/27/23 1246 16     Temp 03/27/23 1246 98.4 F (36.9 C)     Temp Source 03/27/23 1246 Oral     SpO2 03/27/23 1246 98 %     Weight 03/27/23 1245 147 lb 0.8 oz (66.7 kg)     Height 03/27/23 1245 5\' 4"  (1.626 m)     Head Circumference --      Peak Flow --      Pain Score  03/27/23 1244 5     Pain Loc --      Pain Education --      Exclude from Growth Chart --    No data found.  Updated Vital Signs BP 108/72 (BP Location:  Left Arm)   Pulse (!) 57   Temp 98.4 F (36.9 C) (Oral)   Resp 16   Ht 5\' 4"  (1.626 m)   Wt 147 lb 0.8 oz (66.7 kg)   SpO2 98%   BMI 25.24 kg/m    Physical Exam Constitutional:      General: She is not in acute distress.    Appearance: She is normal weight.  Cardiovascular:     Rate and Rhythm: Regular rhythm. Bradycardia present.     Heart sounds: Normal heart sounds.  Pulmonary:     Effort: Pulmonary effort is normal.     Breath sounds: Normal breath sounds.  Musculoskeletal:        General: Swelling and tenderness present.     Comments: Decreased flexion of the right index finger.  Normal extension.  1+ swelling noted at the MCP at the base of the first index finger.  Pain with palpation of the MCP of the left index finger.  Skin:    General: Skin is warm and dry.     Capillary Refill: Capillary refill takes less than 2 seconds.     Findings: Erythema present.  Neurological:     General: No focal deficit present.     Mental Status: She is alert and oriented to person, place, and time.      UC Treatments / Results  Labs  Labs Reviewed  URIC ACID     EKG   Radiology  Imaging Orders         DG Hand Complete Right      Procedures Procedures (including critical care time)  Medications Ordered in UC Medications - No data to display  Initial Impression / Assessment and Plan / UC Course  I have reviewed the triage vital signs and the nursing notes.  Pertinent labs & imaging results that were available during my care of the patient were reviewed by me and considered in my medical decision making (see chart for details).     62 year old female with a history of CKD presents to urgent care today with pain, swelling, redness and warmth of the MCP of her right index finger.  DDx include inflammatory  arthritis, gout, cellulitis.  X-ray right hand consistent with soft tissue swelling but does not show any acute abnormality.  Uric acid within normal limits.  Rx for Pred taper x 6 days.  Encouraged her to ice and elevate to help reduce pain and swelling.  Advised her to follow-up with her PCP if symptoms persist or worsen.  Final Clinical Impressions(s) / UC Diagnoses   Final diagnoses:  Swelling of hand joint, right  Pain of right hand     Discharge Instructions      You were seen today for pain, redness, swelling and warmth of the right hand.  Your uric acid level was normal but that does not necessarily mean that this is not gout.  Your x-ray was unremarkable.  I am going to put you on a prednisone taper to help with joint pain and inflammation.  I encourage ice and elevation as well.  Please follow-up with your PCP if symptoms persist or worsen.     ED Prescriptions     Medication Sig Dispense Auth. Provider   predniSONE (DELTASONE) 10 MG tablet Take 6 tabs on day 1, 5 tabs on day 2, 4 tabs on day 3, 3 tabs on day 4, 2 tabs on day 5, 1 tab on day 6 21 tablet Layne Lebon, Salvadore Oxford, NP  PDMP not reviewed this encounter.   Lorre Munroe, NP 03/27/23 1330

## 2023-03-27 NOTE — ED Triage Notes (Signed)
Pt has redness, pain, and swelling on her right hand near her index finger. Started about 2 days ago. No known injury. Area is warm to the touch.

## 2023-03-27 NOTE — Discharge Instructions (Addendum)
You were seen today for pain, redness, swelling and warmth of the right hand.  Your uric acid level was normal but that does not necessarily mean that this is not gout.  Your x-ray was unremarkable.  I am going to put you on a prednisone taper to help with joint pain and inflammation.  I encourage ice and elevation as well.  Please follow-up with your PCP if symptoms persist or worsen.

## 2023-03-30 ENCOUNTER — Encounter: Payer: Self-pay | Admitting: Emergency Medicine

## 2023-03-30 ENCOUNTER — Inpatient Hospital Stay: Payer: No Typology Code available for payment source | Admitting: Anesthesiology

## 2023-03-30 ENCOUNTER — Encounter
Admission: EM | Disposition: A | Discharge status: Home / Self Care | Payer: Self-pay | Source: Home / Self Care | Attending: Internal Medicine

## 2023-03-30 ENCOUNTER — Other Ambulatory Visit: Payer: Self-pay

## 2023-03-30 ENCOUNTER — Inpatient Hospital Stay: Payer: No Typology Code available for payment source

## 2023-03-30 ENCOUNTER — Inpatient Hospital Stay
Admission: EM | Admit: 2023-03-30 | Discharge: 2023-04-07 | DRG: 854 | Disposition: A | Discharge status: Home / Self Care | Payer: No Typology Code available for payment source | Attending: Internal Medicine | Admitting: Internal Medicine

## 2023-03-30 DIAGNOSIS — Z79890 Hormone replacement therapy (postmenopausal): Secondary | ICD-10-CM | POA: Diagnosis not present

## 2023-03-30 DIAGNOSIS — F32A Depression, unspecified: Secondary | ICD-10-CM | POA: Diagnosis present

## 2023-03-30 DIAGNOSIS — A4902 Methicillin resistant Staphylococcus aureus infection, unspecified site: Secondary | ICD-10-CM

## 2023-03-30 DIAGNOSIS — L03113 Cellulitis of right upper limb: Secondary | ICD-10-CM | POA: Diagnosis present

## 2023-03-30 DIAGNOSIS — Z803 Family history of malignant neoplasm of breast: Secondary | ICD-10-CM

## 2023-03-30 DIAGNOSIS — L02511 Cutaneous abscess of right hand: Secondary | ICD-10-CM | POA: Diagnosis present

## 2023-03-30 DIAGNOSIS — Z794 Long term (current) use of insulin: Secondary | ICD-10-CM

## 2023-03-30 DIAGNOSIS — Z885 Allergy status to narcotic agent status: Secondary | ICD-10-CM | POA: Diagnosis not present

## 2023-03-30 DIAGNOSIS — M65841 Other synovitis and tenosynovitis, right hand: Secondary | ICD-10-CM | POA: Diagnosis present

## 2023-03-30 DIAGNOSIS — J4489 Other specified chronic obstructive pulmonary disease: Secondary | ICD-10-CM | POA: Diagnosis present

## 2023-03-30 DIAGNOSIS — F418 Other specified anxiety disorders: Secondary | ICD-10-CM | POA: Diagnosis not present

## 2023-03-30 DIAGNOSIS — I129 Hypertensive chronic kidney disease with stage 1 through stage 4 chronic kidney disease, or unspecified chronic kidney disease: Secondary | ICD-10-CM | POA: Diagnosis present

## 2023-03-30 DIAGNOSIS — B9562 Methicillin resistant Staphylococcus aureus infection as the cause of diseases classified elsewhere: Secondary | ICD-10-CM | POA: Diagnosis present

## 2023-03-30 DIAGNOSIS — F419 Anxiety disorder, unspecified: Secondary | ICD-10-CM | POA: Diagnosis present

## 2023-03-30 DIAGNOSIS — Z9049 Acquired absence of other specified parts of digestive tract: Secondary | ICD-10-CM | POA: Diagnosis not present

## 2023-03-30 DIAGNOSIS — Z23 Encounter for immunization: Secondary | ICD-10-CM

## 2023-03-30 DIAGNOSIS — Z7951 Long term (current) use of inhaled steroids: Secondary | ICD-10-CM

## 2023-03-30 DIAGNOSIS — N1831 Chronic kidney disease, stage 3a: Secondary | ICD-10-CM | POA: Diagnosis present

## 2023-03-30 DIAGNOSIS — R7989 Other specified abnormal findings of blood chemistry: Secondary | ICD-10-CM | POA: Diagnosis not present

## 2023-03-30 DIAGNOSIS — A419 Sepsis, unspecified organism: Principal | ICD-10-CM | POA: Diagnosis present

## 2023-03-30 DIAGNOSIS — M1711 Unilateral primary osteoarthritis, right knee: Secondary | ICD-10-CM | POA: Diagnosis present

## 2023-03-30 DIAGNOSIS — L0390 Cellulitis, unspecified: Secondary | ICD-10-CM

## 2023-03-30 DIAGNOSIS — Z90710 Acquired absence of both cervix and uterus: Secondary | ICD-10-CM

## 2023-03-30 DIAGNOSIS — R200 Anesthesia of skin: Secondary | ICD-10-CM | POA: Diagnosis not present

## 2023-03-30 DIAGNOSIS — Z7984 Long term (current) use of oral hypoglycemic drugs: Secondary | ICD-10-CM

## 2023-03-30 DIAGNOSIS — E039 Hypothyroidism, unspecified: Secondary | ICD-10-CM | POA: Diagnosis present

## 2023-03-30 DIAGNOSIS — Z888 Allergy status to other drugs, medicaments and biological substances status: Secondary | ICD-10-CM | POA: Diagnosis not present

## 2023-03-30 DIAGNOSIS — Z833 Family history of diabetes mellitus: Secondary | ICD-10-CM

## 2023-03-30 DIAGNOSIS — Z79899 Other long term (current) drug therapy: Secondary | ICD-10-CM | POA: Diagnosis not present

## 2023-03-30 DIAGNOSIS — R6520 Severe sepsis without septic shock: Secondary | ICD-10-CM | POA: Diagnosis present

## 2023-03-30 DIAGNOSIS — K219 Gastro-esophageal reflux disease without esophagitis: Secondary | ICD-10-CM | POA: Diagnosis present

## 2023-03-30 DIAGNOSIS — E1129 Type 2 diabetes mellitus with other diabetic kidney complication: Secondary | ICD-10-CM | POA: Diagnosis not present

## 2023-03-30 DIAGNOSIS — E1122 Type 2 diabetes mellitus with diabetic chronic kidney disease: Secondary | ICD-10-CM | POA: Diagnosis present

## 2023-03-30 DIAGNOSIS — I10 Essential (primary) hypertension: Secondary | ICD-10-CM | POA: Diagnosis not present

## 2023-03-30 DIAGNOSIS — W548XXA Other contact with dog, initial encounter: Secondary | ICD-10-CM | POA: Diagnosis not present

## 2023-03-30 DIAGNOSIS — Z87891 Personal history of nicotine dependence: Secondary | ICD-10-CM | POA: Diagnosis not present

## 2023-03-30 DIAGNOSIS — D631 Anemia in chronic kidney disease: Secondary | ICD-10-CM | POA: Diagnosis present

## 2023-03-30 DIAGNOSIS — M7989 Other specified soft tissue disorders: Secondary | ICD-10-CM | POA: Diagnosis present

## 2023-03-30 DIAGNOSIS — E785 Hyperlipidemia, unspecified: Secondary | ICD-10-CM | POA: Diagnosis present

## 2023-03-30 DIAGNOSIS — J449 Chronic obstructive pulmonary disease, unspecified: Secondary | ICD-10-CM | POA: Diagnosis present

## 2023-03-30 HISTORY — PX: INCISION AND DRAINAGE ABSCESS: SHX5864

## 2023-03-30 HISTORY — DX: Cellulitis of right upper limb: L03.113

## 2023-03-30 HISTORY — DX: Sepsis, unspecified organism: R65.20

## 2023-03-30 HISTORY — DX: Methicillin resistant Staphylococcus aureus infection, unspecified site: A49.02

## 2023-03-30 LAB — GLUCOSE, CAPILLARY
Glucose-Capillary: 131 mg/dL — ABNORMAL HIGH (ref 70–99)
Glucose-Capillary: 101 mg/dL — ABNORMAL HIGH (ref 70–99)
Glucose-Capillary: 96 mg/dL (ref 70–99)
Glucose-Capillary: 146 mg/dL — ABNORMAL HIGH (ref 70–99)
Glucose-Capillary: 150 mg/dL — ABNORMAL HIGH (ref 70–99)

## 2023-03-30 LAB — CBC WITH DIFFERENTIAL/PLATELET
Abs Immature Granulocytes: 0.08 10*3/uL — ABNORMAL HIGH (ref 0.00–0.07)
Basophils Absolute: 0 10*3/uL (ref 0.0–0.1)
Basophils Relative: 0 %
Eosinophils Absolute: 0 10*3/uL (ref 0.0–0.5)
Eosinophils Relative: 0 %
HCT: 39 % (ref 36.0–46.0)
Hemoglobin: 12.5 g/dL (ref 12.0–15.0)
Immature Granulocytes: 1 %
Lymphocytes Relative: 8 %
Lymphs Abs: 1.3 10*3/uL (ref 0.7–4.0)
MCH: 30.3 pg (ref 26.0–34.0)
MCHC: 32.1 g/dL (ref 30.0–36.0)
MCV: 94.4 fL (ref 80.0–100.0)
Monocytes Absolute: 1.2 10*3/uL — ABNORMAL HIGH (ref 0.1–1.0)
Monocytes Relative: 7 %
Neutro Abs: 13.6 10*3/uL — ABNORMAL HIGH (ref 1.7–7.7)
Neutrophils Relative %: 84 %
Platelets: 390 10*3/uL (ref 150–400)
RBC: 4.13 MIL/uL (ref 3.87–5.11)
RDW: 13.8 % (ref 11.5–15.5)
WBC: 16.1 10*3/uL — ABNORMAL HIGH (ref 4.0–10.5)
nRBC: 0 % (ref 0.0–0.2)

## 2023-03-30 LAB — HEPATITIS PANEL, ACUTE
HCV Ab: NONREACTIVE
Hep A IgM: NONREACTIVE
Hep B C IgM: NONREACTIVE
Hepatitis B Surface Ag: NONREACTIVE

## 2023-03-30 LAB — ACETAMINOPHEN LEVEL: Acetaminophen (Tylenol), Serum: <10 ug/mL — ABNORMAL LOW (ref 10–30)

## 2023-03-30 LAB — CULTURE, BLOOD (ROUTINE X 2)
Culture: NO GROWTH
Culture: NO GROWTH

## 2023-03-30 LAB — COMPREHENSIVE METABOLIC PANEL
ALT: 99 U/L — ABNORMAL HIGH (ref 0–44)
AST: 59 U/L — ABNORMAL HIGH (ref 15–41)
Albumin: 4.9 g/dL (ref 3.5–5.0)
Alkaline Phosphatase: 103 U/L (ref 38–126)
Anion gap: 6 (ref 5–15)
BUN: 32 mg/dL — ABNORMAL HIGH (ref 8–23)
CO2: 25 mmol/L (ref 22–32)
Calcium: 9.2 mg/dL (ref 8.9–10.3)
Chloride: 103 mmol/L (ref 98–111)
Creatinine, Ser: 1.29 mg/dL — ABNORMAL HIGH (ref 0.44–1.00)
GFR, Estimated: 47 mL/min — ABNORMAL LOW (ref >60)
Glucose, Bld: 128 mg/dL — ABNORMAL HIGH (ref 70–99)
Potassium: 3.7 mmol/L (ref 3.5–5.1)
Sodium: 134 mmol/L — ABNORMAL LOW (ref 135–145)
Total Bilirubin: 0.4 mg/dL (ref 0.3–1.2)
Total Protein: 8 g/dL (ref 6.5–8.1)

## 2023-03-30 LAB — LACTIC ACID, PLASMA
Lactic Acid, Venous: 1.2 mmol/L (ref 0.5–1.9)
Lactic Acid, Venous: 1.6 mmol/L (ref 0.5–1.9)
Lactic Acid, Venous: 1.7 mmol/L (ref 0.5–1.9)
Lactic Acid, Venous: 1.8 mmol/L (ref 0.5–1.9)
Lactic Acid, Venous: 2.4 mmol/L (ref 0.5–1.9)

## 2023-03-30 LAB — APTT: aPTT: 28 seconds (ref 24–36)

## 2023-03-30 LAB — URIC ACID: Uric Acid, Serum: 5.3 mg/dL (ref 2.5–7.1)

## 2023-03-30 LAB — PROCALCITONIN: Procalcitonin: 0.16 ng/mL

## 2023-03-30 LAB — HEMOGLOBIN A1C
Hgb A1c MFr Bld: 6.1 % — ABNORMAL HIGH (ref 4.8–5.6)
Mean Plasma Glucose: 128.37 mg/dL

## 2023-03-30 LAB — HIV ANTIBODY (ROUTINE TESTING W REFLEX): HIV Screen 4th Generation wRfx: NONREACTIVE

## 2023-03-30 LAB — SEDIMENTATION RATE: Sed Rate: 39 mm/hr — ABNORMAL HIGH (ref 0–30)

## 2023-03-30 LAB — C-REACTIVE PROTEIN: CRP: 3.2 mg/dL — ABNORMAL HIGH (ref <1.0)

## 2023-03-30 SURGERY — INCISION AND DRAINAGE, ABSCESS
Anesthesia: General | Site: Hand | Laterality: Right

## 2023-03-30 MED ORDER — INSULIN ASPART 100 UNIT/ML IJ SOLN
0.0000 [IU] | Freq: Three times a day (TID) | INTRAMUSCULAR | Status: DC
  Administered 2023-03-30: 2 [IU] via SUBCUTANEOUS
  Administered 2023-03-31: 3 [IU] via SUBCUTANEOUS
  Administered 2023-03-31: 2 [IU] via SUBCUTANEOUS
  Administered 2023-03-31: 3 [IU] via SUBCUTANEOUS
  Administered 2023-04-02 – 2023-04-07 (×8): 2 [IU] via SUBCUTANEOUS
  Filled 2023-03-30 (×12): qty 1

## 2023-03-30 MED ORDER — VANCOMYCIN HCL 1500 MG/300ML IV SOLN
1500.0000 mg | Freq: Once | INTRAVENOUS | Status: AC
  Administered 2023-03-30: 1500 mg via INTRAVENOUS
  Filled 2023-03-30: qty 300

## 2023-03-30 MED ORDER — EPHEDRINE SULFATE (PRESSORS) 50 MG/ML IJ SOLN
INTRAMUSCULAR | Status: DC | PRN
  Administered 2023-03-30: 10 mg via INTRAVENOUS

## 2023-03-30 MED ORDER — ROSUVASTATIN CALCIUM 10 MG PO TABS
10.0000 mg | ORAL_TABLET | Freq: Every day | ORAL | Status: DC
  Administered 2023-03-30 – 2023-04-07 (×7): 10 mg via ORAL
  Filled 2023-03-30 (×8): qty 1

## 2023-03-30 MED ORDER — MIDODRINE HCL 5 MG PO TABS
10.0000 mg | ORAL_TABLET | Freq: Three times a day (TID) | ORAL | Status: DC
  Administered 2023-03-30 – 2023-03-31 (×3): 10 mg via ORAL
  Filled 2023-03-30 (×3): qty 2

## 2023-03-30 MED ORDER — CEFAZOLIN SODIUM-DEXTROSE 2-4 GM/100ML-% IV SOLN
2.0000 g | INTRAVENOUS | Status: AC
  Administered 2023-04-01: 2 g via INTRAVENOUS

## 2023-03-30 MED ORDER — CEFAZOLIN SODIUM-DEXTROSE 2-4 GM/100ML-% IV SOLN
2.0000 g | Freq: Three times a day (TID) | INTRAVENOUS | Status: AC
  Administered 2023-03-30 – 2023-03-31 (×3): 2 g via INTRAVENOUS
  Filled 2023-03-30 (×3): qty 100

## 2023-03-30 MED ORDER — DOCUSATE SODIUM 100 MG PO CAPS
100.0000 mg | ORAL_CAPSULE | Freq: Two times a day (BID) | ORAL | Status: DC
  Administered 2023-03-30 – 2023-04-01 (×4): 100 mg via ORAL
  Filled 2023-03-30 (×4): qty 1

## 2023-03-30 MED ORDER — DEXAMETHASONE SODIUM PHOSPHATE 10 MG/ML IJ SOLN
INTRAMUSCULAR | Status: DC | PRN
  Administered 2023-03-30: 5 mg via INTRAVENOUS

## 2023-03-30 MED ORDER — IBUPROFEN 400 MG PO TABS
200.0000 mg | ORAL_TABLET | Freq: Four times a day (QID) | ORAL | Status: DC | PRN
  Administered 2023-03-30: 200 mg via ORAL
  Filled 2023-03-30: qty 1

## 2023-03-30 MED ORDER — ONDANSETRON HCL 4 MG/2ML IJ SOLN
INTRAMUSCULAR | Status: AC
  Administered 2023-03-30: 4 mg
  Filled 2023-03-30: qty 2

## 2023-03-30 MED ORDER — ONDANSETRON HCL 4 MG/2ML IJ SOLN: 4.0000 mg | Freq: Three times a day (TID) | INTRAMUSCULAR | Status: DC | PRN

## 2023-03-30 MED ORDER — KETOROLAC TROMETHAMINE 15 MG/ML IJ SOLN
INTRAMUSCULAR | Status: AC
  Filled 2023-03-30: qty 1

## 2023-03-30 MED ORDER — MONTELUKAST SODIUM 10 MG PO TABS
10.0000 mg | ORAL_TABLET | Freq: Every day | ORAL | Status: DC
  Administered 2023-03-31 – 2023-04-06 (×7): 10 mg via ORAL
  Filled 2023-03-30 (×8): qty 1

## 2023-03-30 MED ORDER — DROPERIDOL 2.5 MG/ML IJ SOLN: 0.6250 mg | Freq: Once | INTRAMUSCULAR | Status: DC | PRN

## 2023-03-30 MED ORDER — PROPOFOL 10 MG/ML IV BOLUS
INTRAVENOUS | Status: AC
  Filled 2023-03-30: qty 20

## 2023-03-30 MED ORDER — SODIUM CHLORIDE 0.9 % IV SOLN
1.0000 g | Freq: Once | INTRAVENOUS | Status: AC
  Administered 2023-03-30: 1 g via INTRAVENOUS
  Filled 2023-03-30: qty 10

## 2023-03-30 MED ORDER — OXYCODONE HCL 5 MG PO TABS
5.0000 mg | ORAL_TABLET | Freq: Four times a day (QID) | ORAL | Status: DC | PRN
  Administered 2023-03-31 – 2023-04-05 (×16): 5 mg via ORAL
  Filled 2023-03-30 (×18): qty 1

## 2023-03-30 MED ORDER — HYDRALAZINE HCL 20 MG/ML IJ SOLN
5.0000 mg | INTRAMUSCULAR | Status: DC | PRN
  Administered 2023-04-04 – 2023-04-06 (×2): 5 mg via INTRAVENOUS
  Filled 2023-03-30 (×2): qty 1

## 2023-03-30 MED ORDER — INSULIN ASPART 100 UNIT/ML IJ SOLN
0.0000 [IU] | Freq: Every day | INTRAMUSCULAR | Status: DC
  Administered 2023-03-31 – 2023-04-04 (×2): 2 [IU] via SUBCUTANEOUS
  Filled 2023-03-30 (×2): qty 1

## 2023-03-30 MED ORDER — SODIUM CHLORIDE 0.9 % IV BOLUS: 500.0000 mL | Freq: Once | INTRAVENOUS | Status: DC

## 2023-03-30 MED ORDER — FLEET ENEMA 7-19 GM/118ML RE ENEM: 1.0000 | ENEMA | Freq: Once | RECTAL | Status: DC | PRN

## 2023-03-30 MED ORDER — MORPHINE SULFATE (PF) 4 MG/ML IV SOLN
4.0000 mg | Freq: Once | INTRAVENOUS | Status: AC
  Administered 2023-03-30: 4 mg via INTRAVENOUS
  Filled 2023-03-30: qty 1

## 2023-03-30 MED ORDER — ONDANSETRON HCL 4 MG/2ML IJ SOLN
INTRAMUSCULAR | Status: AC
  Filled 2023-03-30: qty 2

## 2023-03-30 MED ORDER — HYDROCORTISONE SOD SUC (PF) 100 MG IJ SOLR
100.0000 mg | Freq: Once | INTRAMUSCULAR | Status: DC
  Filled 2023-03-30: qty 2

## 2023-03-30 MED ORDER — ONDANSETRON HCL 4 MG/2ML IJ SOLN
INTRAMUSCULAR | Status: DC | PRN
Start: 2023-03-30 — End: 2023-03-30
  Administered 2023-03-30: 4 mg via INTRAVENOUS

## 2023-03-30 MED ORDER — FENTANYL CITRATE (PF) 100 MCG/2ML IJ SOLN
INTRAMUSCULAR | Status: AC
  Filled 2023-03-30: qty 2

## 2023-03-30 MED ORDER — ALBUTEROL SULFATE (2.5 MG/3ML) 0.083% IN NEBU: 2.5000 mg | INHALATION_SOLUTION | RESPIRATORY_TRACT | Status: DC | PRN

## 2023-03-30 MED ORDER — IOHEXOL 300 MG/ML  SOLN
100.0000 mL | Freq: Once | INTRAMUSCULAR | Status: AC | PRN
  Administered 2023-03-30: 100 mL via INTRAVENOUS

## 2023-03-30 MED ORDER — PROPOFOL 10 MG/ML IV BOLUS
INTRAVENOUS | Status: DC | PRN
Start: 2023-03-30 — End: 2023-03-30
  Administered 2023-03-30: 20 mg via INTRAVENOUS
  Administered 2023-03-30: 30 mg via INTRAVENOUS
  Administered 2023-03-30: 50 mg via INTRAVENOUS

## 2023-03-30 MED ORDER — DIPHENHYDRAMINE HCL 12.5 MG/5ML PO ELIX: 12.5000 mg | ORAL_SOLUTION | ORAL | Status: DC | PRN

## 2023-03-30 MED ORDER — KETOROLAC TROMETHAMINE 15 MG/ML IJ SOLN
7.5000 mg | Freq: Four times a day (QID) | INTRAMUSCULAR | Status: AC
  Administered 2023-03-31 (×4): 7.5 mg via INTRAVENOUS
  Filled 2023-03-30 (×4): qty 1

## 2023-03-30 MED ORDER — LACTATED RINGERS IV BOLUS (SEPSIS)
1000.0000 mL | Freq: Once | INTRAVENOUS | Status: AC
  Administered 2023-03-30: 1000 mL via INTRAVENOUS

## 2023-03-30 MED ORDER — MORPHINE SULFATE (PF) 2 MG/ML IV SOLN
2.0000 mg | INTRAVENOUS | Status: DC | PRN
  Administered 2023-04-01 (×2): 2 mg via INTRAVENOUS
  Filled 2023-03-30: qty 2
  Filled 2023-03-30 (×2): qty 1

## 2023-03-30 MED ORDER — ONDANSETRON HCL 4 MG PO TABS: 4.0000 mg | ORAL_TABLET | Freq: Four times a day (QID) | ORAL | Status: DC | PRN

## 2023-03-30 MED ORDER — SODIUM CHLORIDE 0.9 % IV SOLN: INTRAVENOUS | Status: DC | PRN

## 2023-03-30 MED ORDER — OXYCODONE-ACETAMINOPHEN 5-325 MG PO TABS: 1.0000 | ORAL_TABLET | ORAL | Status: DC | PRN

## 2023-03-30 MED ORDER — DEXAMETHASONE SODIUM PHOSPHATE 10 MG/ML IJ SOLN
INTRAMUSCULAR | Status: AC
  Filled 2023-03-30: qty 1

## 2023-03-30 MED ORDER — VANCOMYCIN HCL 750 MG/150ML IV SOLN: 750.0000 mg | INTRAVENOUS | Status: DC

## 2023-03-30 MED ORDER — MORPHINE SULFATE (PF) 2 MG/ML IV SOLN
2.0000 mg | INTRAVENOUS | Status: DC | PRN
  Administered 2023-03-30 (×2): 2 mg via INTRAVENOUS
  Filled 2023-03-30 (×2): qty 1

## 2023-03-30 MED ORDER — FERROUS SULFATE 325 (65 FE) MG PO TABS
325.0000 mg | ORAL_TABLET | Freq: Every day | ORAL | Status: DC
  Administered 2023-03-31 – 2023-04-07 (×7): 325 mg via ORAL
  Filled 2023-03-30 (×7): qty 1

## 2023-03-30 MED ORDER — LEVOTHYROXINE SODIUM 88 MCG PO TABS
88.0000 ug | ORAL_TABLET | Freq: Every day | ORAL | Status: DC
  Administered 2023-03-31 – 2023-04-07 (×7): 88 ug via ORAL
  Filled 2023-03-30 (×8): qty 1

## 2023-03-30 MED ORDER — LIDOCAINE HCL (PF) 2 % IJ SOLN
INTRAMUSCULAR | Status: AC
  Filled 2023-03-30: qty 5

## 2023-03-30 MED ORDER — ACETAMINOPHEN 500 MG PO TABS
1000.0000 mg | ORAL_TABLET | Freq: Four times a day (QID) | ORAL | Status: AC
  Administered 2023-03-31 (×4): 1000 mg via ORAL
  Filled 2023-03-30 (×4): qty 2

## 2023-03-30 MED ORDER — KETOROLAC TROMETHAMINE 15 MG/ML IJ SOLN
15.0000 mg | Freq: Once | INTRAMUSCULAR | Status: AC
  Administered 2023-03-30: 15 mg via INTRAVENOUS

## 2023-03-30 MED ORDER — ACETAMINOPHEN 325 MG PO TABS
650.0000 mg | ORAL_TABLET | Freq: Four times a day (QID) | ORAL | Status: DC | PRN
  Administered 2023-03-30: 650 mg via ORAL
  Filled 2023-03-30: qty 2

## 2023-03-30 MED ORDER — 0.9 % SODIUM CHLORIDE (POUR BTL) OPTIME
TOPICAL | Status: DC | PRN
  Administered 2023-03-30 (×2): 1000 mL

## 2023-03-30 MED ORDER — MAGNESIUM HYDROXIDE 400 MG/5ML PO SUSP: 30.0000 mL | Freq: Every day | ORAL | Status: DC | PRN

## 2023-03-30 MED ORDER — FENTANYL CITRATE (PF) 100 MCG/2ML IJ SOLN
INTRAMUSCULAR | Status: DC | PRN
  Administered 2023-03-30 (×2): 50 ug via INTRAVENOUS

## 2023-03-30 MED ORDER — HEPARIN SODIUM (PORCINE) 5000 UNIT/ML IJ SOLN: 5000.0000 [IU] | Freq: Three times a day (TID) | INTRAMUSCULAR | Status: DC

## 2023-03-30 MED ORDER — DM-GUAIFENESIN ER 30-600 MG PO TB12: 1.0000 | ORAL_TABLET | Freq: Two times a day (BID) | ORAL | Status: DC | PRN

## 2023-03-30 MED ORDER — SODIUM CHLORIDE 0.9 % IV SOLN: 1.0000 g | INTRAVENOUS | Status: DC

## 2023-03-30 MED ORDER — DEXMEDETOMIDINE HCL IN NACL 80 MCG/20ML IV SOLN
INTRAVENOUS | Status: DC | PRN
  Administered 2023-03-30: 12 ug via INTRAVENOUS
  Administered 2023-03-30: 4 ug via INTRAVENOUS
  Administered 2023-03-30: 8 ug via INTRAVENOUS

## 2023-03-30 MED ORDER — SODIUM CHLORIDE 0.9 % IV BOLUS
1000.0000 mL | Freq: Once | INTRAVENOUS | Status: AC
  Administered 2023-03-30: 1000 mL via INTRAVENOUS

## 2023-03-30 MED ORDER — MIDAZOLAM HCL 2 MG/2ML IJ SOLN
INTRAMUSCULAR | Status: DC | PRN
  Administered 2023-03-30: 2 mg via INTRAVENOUS

## 2023-03-30 MED ORDER — CITALOPRAM HYDROBROMIDE 10 MG PO TABS
40.0000 mg | ORAL_TABLET | Freq: Every evening | ORAL | Status: DC
  Administered 2023-03-30 – 2023-04-06 (×7): 40 mg via ORAL
  Filled 2023-03-30 (×8): qty 4

## 2023-03-30 MED ORDER — MOMETASONE FURO-FORMOTEROL FUM 200-5 MCG/ACT IN AERO
2.0000 | INHALATION_SPRAY | Freq: Two times a day (BID) | RESPIRATORY_TRACT | Status: DC
  Administered 2023-03-31 – 2023-04-06 (×13): 2 via RESPIRATORY_TRACT
  Filled 2023-03-30: qty 8.8

## 2023-03-30 MED ORDER — METOCLOPRAMIDE HCL 5 MG PO TABS: 5.0000 mg | ORAL_TABLET | Freq: Three times a day (TID) | ORAL | Status: DC | PRN

## 2023-03-30 MED ORDER — LACTATED RINGERS IV BOLUS
500.0000 mL | Freq: Once | INTRAVENOUS | Status: AC
  Administered 2023-03-30: 500 mL via INTRAVENOUS

## 2023-03-30 MED ORDER — LACTATED RINGERS IV SOLN: INTRAVENOUS | Status: DC

## 2023-03-30 MED ORDER — ALBUMIN HUMAN 25 % IV SOLN
25.0000 g | Freq: Once | INTRAVENOUS | Status: DC
  Filled 2023-03-30: qty 100

## 2023-03-30 MED ORDER — EPHEDRINE 5 MG/ML INJ
INTRAVENOUS | Status: AC
  Filled 2023-03-30: qty 5

## 2023-03-30 MED ORDER — ACETAMINOPHEN 325 MG PO TABS
325.0000 mg | ORAL_TABLET | Freq: Four times a day (QID) | ORAL | Status: DC | PRN
  Administered 2023-04-01 – 2023-04-03 (×4): 650 mg via ORAL
  Administered 2023-04-04: 325 mg via ORAL
  Administered 2023-04-05 (×2): 650 mg via ORAL
  Filled 2023-03-30 (×2): qty 2
  Filled 2023-03-30: qty 1
  Filled 2023-03-30 (×5): qty 2

## 2023-03-30 MED ORDER — LORATADINE 10 MG PO TABS
10.0000 mg | ORAL_TABLET | Freq: Every evening | ORAL | Status: DC
  Administered 2023-03-30 – 2023-04-06 (×7): 10 mg via ORAL
  Filled 2023-03-30 (×7): qty 1

## 2023-03-30 MED ORDER — ONDANSETRON HCL 4 MG/2ML IJ SOLN: 4.0000 mg | Freq: Four times a day (QID) | INTRAMUSCULAR | Status: DC | PRN

## 2023-03-30 MED ORDER — LACTATED RINGERS IV BOLUS: 500.0000 mL | Freq: Once | INTRAVENOUS | Status: DC

## 2023-03-30 MED ORDER — FENTANYL CITRATE PF 50 MCG/ML IJ SOSY
50.0000 ug | PREFILLED_SYRINGE | Freq: Once | INTRAMUSCULAR | Status: AC
  Administered 2023-03-30: 50 ug via INTRAVENOUS
  Filled 2023-03-30: qty 1

## 2023-03-30 MED ORDER — FENTANYL CITRATE (PF) 100 MCG/2ML IJ SOLN
25.0000 ug | INTRAMUSCULAR | Status: DC | PRN
  Administered 2023-03-30 (×2): 50 ug via INTRAVENOUS

## 2023-03-30 MED ORDER — MIDAZOLAM HCL 2 MG/2ML IJ SOLN
INTRAMUSCULAR | Status: AC
  Filled 2023-03-30: qty 2

## 2023-03-30 MED ORDER — CEFAZOLIN SODIUM-DEXTROSE 2-3 GM-%(50ML) IV SOLR
INTRAVENOUS | Status: DC | PRN
  Administered 2023-03-30: 2 g via INTRAVENOUS

## 2023-03-30 MED ORDER — BISACODYL 10 MG RE SUPP: 10.0000 mg | Freq: Every day | RECTAL | Status: DC | PRN

## 2023-03-30 MED ORDER — SODIUM CHLORIDE 0.9 % IV SOLN: INTRAVENOUS | Status: DC

## 2023-03-30 MED ORDER — DEXMEDETOMIDINE HCL IN NACL 80 MCG/20ML IV SOLN
INTRAVENOUS | Status: AC
  Filled 2023-03-30: qty 20

## 2023-03-30 MED ORDER — ENOXAPARIN SODIUM 40 MG/0.4ML IJ SOSY
40.0000 mg | PREFILLED_SYRINGE | INTRAMUSCULAR | Status: DC
  Administered 2023-03-31: 40 mg via SUBCUTANEOUS
  Filled 2023-03-30: qty 0.4

## 2023-03-30 MED ORDER — SUCCINYLCHOLINE CHLORIDE 200 MG/10ML IV SOSY
PREFILLED_SYRINGE | INTRAVENOUS | Status: AC
  Filled 2023-03-30: qty 10

## 2023-03-30 MED ORDER — LIDOCAINE HCL (CARDIAC) PF 100 MG/5ML IV SOSY
PREFILLED_SYRINGE | INTRAVENOUS | Status: DC | PRN
  Administered 2023-03-30: 80 mg via INTRAVENOUS

## 2023-03-30 MED ORDER — METOCLOPRAMIDE HCL 5 MG/ML IJ SOLN: 5.0000 mg | Freq: Three times a day (TID) | INTRAMUSCULAR | Status: DC | PRN

## 2023-03-30 SURGICAL SUPPLY — 48 items
APL PRP STRL LF DISP 70% ISPRP (MISCELLANEOUS) ×1
BLADE SURG SZ10 CARB STEEL (BLADE) ×1 IMPLANT
BNDG CMPR 5X4 CHSV STRCH STRL (GAUZE/BANDAGES/DRESSINGS) ×1
BNDG COHESIVE 4X5 TAN STRL LF (GAUZE/BANDAGES/DRESSINGS) ×1 IMPLANT
BNDG ESMARCH 4 X 12 STRL LF (GAUZE/BANDAGES/DRESSINGS)
BNDG ESMARCH 4X12 STRL LF (GAUZE/BANDAGES/DRESSINGS) ×1 IMPLANT
BNDG GZE 12X3 1 PLY HI ABS (GAUZE/BANDAGES/DRESSINGS) ×1
BNDG STRETCH GAUZE 3IN X12FT (GAUZE/BANDAGES/DRESSINGS) IMPLANT
CHLORAPREP W/TINT 26 (MISCELLANEOUS) ×1 IMPLANT
CORD BIP STRL DISP 12FT (MISCELLANEOUS) IMPLANT
CUFF TOURN SGL QUICK 18X4 (TOURNIQUET CUFF) IMPLANT
CUFF TOURN SGL QUICK 24 (TOURNIQUET CUFF)
CUFF TRNQT CYL 24X4X16.5-23 (TOURNIQUET CUFF) IMPLANT
ELECT REM PT RETURN 9FT ADLT (ELECTROSURGICAL) ×1
ELECTRODE REM PT RTRN 9FT ADLT (ELECTROSURGICAL) ×1 IMPLANT
FORCEPS JEWEL BIP 4-3/4 STR (INSTRUMENTS) IMPLANT
GAUZE XEROFORM 1X8 LF (GAUZE/BANDAGES/DRESSINGS) IMPLANT
GLOVE BIO SURGEON STRL SZ8 (GLOVE) ×1 IMPLANT
GLOVE INDICATOR 8.0 STRL GRN (GLOVE) ×1 IMPLANT
GLOVE SURG ORTHO 8.5 STRL (GLOVE) ×1 IMPLANT
GOWN STRL REUS W/ TWL LRG LVL3 (GOWN DISPOSABLE) ×1 IMPLANT
GOWN STRL REUS W/ TWL XL LVL3 (GOWN DISPOSABLE) ×1 IMPLANT
GOWN STRL REUS W/TWL LRG LVL3 (GOWN DISPOSABLE) ×1
GOWN STRL REUS W/TWL XL LVL3 (GOWN DISPOSABLE) ×1
KIT TURNOVER KIT A (KITS) ×1 IMPLANT
LABEL OR SOLS (LABEL) ×1 IMPLANT
MANIFOLD NEPTUNE II (INSTRUMENTS) ×1 IMPLANT
NDL SAFETY ECLIP 18X1.5 (MISCELLANEOUS) ×1 IMPLANT
NS IRRIG 1000ML POUR BTL (IV SOLUTION) ×1 IMPLANT
PACK EXTREMITY ARMC (MISCELLANEOUS) ×1 IMPLANT
PAD CAST 4YDX4 CTTN HI CHSV (CAST SUPPLIES) ×1 IMPLANT
PADDING CAST COTTON 4X4 STRL (CAST SUPPLIES)
SPLINT CAST 1 STEP 3X12 (MISCELLANEOUS) ×1 IMPLANT
SPONGE T-LAP 18X18 ~~LOC~~+RFID (SPONGE) ×1 IMPLANT
STAPLER SKIN PROX 35W (STAPLE) ×1 IMPLANT
STOCKINETTE BIAS CUT 4 980044 (GAUZE/BANDAGES/DRESSINGS) ×1 IMPLANT
STOCKINETTE IMPERVIOUS 9X36 MD (GAUZE/BANDAGES/DRESSINGS) ×1 IMPLANT
SUT ETHILON 4-0 (SUTURE)
SUT ETHILON 4-0 FS2 18XMFL BLK (SUTURE)
SUT PROLENE 4 0 PS 2 18 (SUTURE) IMPLANT
SUT VIC AB 4-0 SH 27 (SUTURE)
SUT VIC AB 4-0 SH 27XANBCTRL (SUTURE) ×1 IMPLANT
SUT VICRYL+ 3-0 36IN CT-1 (SUTURE) ×1 IMPLANT
SUTURE ETHLN 4-0 FS2 18XMF BLK (SUTURE) ×1 IMPLANT
SWAB CULTURE AMIES ANAERIB BLU (MISCELLANEOUS) IMPLANT
SYR 10ML LL (SYRINGE) ×1 IMPLANT
TRAP FLUID SMOKE EVACUATOR (MISCELLANEOUS) ×1 IMPLANT
WATER STERILE IRR 500ML POUR (IV SOLUTION) ×1 IMPLANT

## 2023-03-30 NOTE — ED Triage Notes (Signed)
Patient ambulatory to triage with steady gait, without difficulty or distress noted; pt reports on Saturday noted swelling/pain/redness to rt index finger; denies any known injury; rx prednisone without reliefl st symptoms have increased

## 2023-03-30 NOTE — Consult Note (Addendum)
ORTHOPAEDIC CONSULTATION  REQUESTING PHYSICIAN: Lorretta Harp, MD  Chief Complaint:   Right hand pain and swelling.  History of Present Illness: Wanda Smith is a 62 y.o. female with multiple medical problems including diabetes, chronic kidney disease, asthma/COPD, gastroesophageal reflux disease, hypothyroidism, hypertension, and anxiety/depression.  The patient was in her usual state of health until Saturday, 4 days ago when she apparently sustained what she describes as a small scratch from her dog over the volar ulnar aspect of her index finger.  She did not see much bleeding and did not initiate any specific treatment for it.  A day or so later, she began to notice increased pain and swelling to her finger.  She went to the [facility] urgent care clinic and was given steroids to treat the swelling.  Over the next 48 hours, she noted increased pain and swelling to her hand with a extension up into her forearm, so she presented to the [facility] emergency room and subsequently was admitted for IV antibiotic treatment of her presumed right hand cellulitis.  The ER provider did not feel that there was any evidence of flexor tenosynovitis or joint involvement at the time of his evaluation of the patient.  The patient was afebrile at the time of admission but did have a white count of 16.1K with 84% neutrophils.  Her lactic acid at admission was 1.2 but has since increased to 2.4.  She also had a fever to 101.3 this morning.  The patient has been started on IV vancomycin.  The patient does not have to have a history of gout and her uric acid level was 5.73 days ago and 5.3 this morning.  The patient notes that the majority of her pain is in the base of her index finger.  She has limited motion of all digits due to the swelling, but denies any numbness or paresthesias to her hand.  Past Medical History:  Diagnosis Date   Anemia    Anxiety     Arthritis    knee (R)   Asthma    Chronic kidney disease    Stage 3 sees Lateef   COPD (chronic obstructive pulmonary disease) (HCC)    Depression    Diabetes mellitus without complication (HCC)    Dyspnea    GERD (gastroesophageal reflux disease)    occ   Hypertension    Hypothyroidism    Increased serum lipids    Wears contact lenses    Past Surgical History:  Procedure Laterality Date   ABDOMINAL HYSTERECTOMY     CARPAL TUNNEL RELEASE Right 07/26/2018   Procedure: CARPAL TUNNEL RELEASE ENDOSCOPIC;  Surgeon: Christena Flake, MD;  Location: [facility];  Service: Orthopedics;  Laterality: Right;  diabetic - oral meds   CHOLECYSTECTOMY     COLONOSCOPY WITH ESOPHAGOGASTRODUODENOSCOPY (EGD)     COLONOSCOPY WITH PROPOFOL N/A 01/13/2018   Procedure: COLONOSCOPY WITH PROPOFOL;  Surgeon: Scot Jun, MD;  Location: [facility] ENDOSCOPY;  Service: Endoscopy;  Laterality: N/A;   HERNIA REPAIR     left rotator cuff repair Left    SHOULDER ARTHROSCOPY WITH SUBACROMIAL DECOMPRESSION, ROTATOR CUFF REPAIR AND BICEP TENDON REPAIR Right 10/21/2021   Procedure: SHOULDER ARTHROSCOPY WITH DEBRIDEMENT, DECOMPRESSION, BICEPS TENODESIS.;  Surgeon: Christena Flake, MD;  Location: [facility] ORS;  Service: Orthopedics;  Laterality: Right;   TUBAL LIGATION     Social History   Socioeconomic History   Marital status: Married    Spouse name: Not on file   Number of  children: Not on file   Years of education: Not on file   Highest education level: Not on file  Occupational History   Not on file  Tobacco Use   Smoking status: Former    Current packs/day: 0.00    Average packs/day: 0.8 packs/day for 20.0 years (15.0 ttl pk-yrs)    Types: Cigarettes    Start date: 09/07/1987    Quit date: 09/07/2007    Years since quitting: 15.5   Smokeless tobacco: Never  Vaping Use   Vaping status: Never Used  Substance and Sexual Activity   Alcohol use: Yes    Alcohol/week: 2.0 standard drinks of alcohol     Types: 2 Standard drinks or equivalent per week    Comment: occasionally (4x/yr)   Drug use: Never   Sexual activity: Not on file  Other Topics Concern   Not on file  Social History Narrative   Not on file   Social Determinants of Health   Financial Resource Strain: Not on file  Food Insecurity: No Food Insecurity (03/30/2023)   Hunger Vital Sign    Worried About Running Out of Food in the Last Year: Never true    Ran Out of Food in the Last Year: Never true  Transportation Needs: No Transportation Needs (03/30/2023)   PRAPARE - Administrator, Civil Service (Medical): No    Lack of Transportation (Non-Medical): No  Physical Activity: Not on file  Stress: Not on file  Social Connections: Not on file   Family History  Problem Relation Age of Onset   Breast cancer Mother 39   Breast cancer Sister 98   Diabetes Sister    Diabetes Father    Diabetes Brother    Allergies  Allergen Reactions   Pravastatin     Abnormal liver function tests   Tramadol Nausea Only   Prior to Admission medications   Medication Sig Start Date End Date Taking? Authorizing Provider  albuterol (PROVENTIL HFA;VENTOLIN HFA) 108 (90 Base) MCG/ACT inhaler Inhale 1-2 puffs into the lungs every 6 (six) hours as needed for wheezing or shortness of breath.   Yes [provider]  calcium carbonate (TUMS - DOSED IN MG ELEMENTAL CALCIUM) 500 MG chewable tablet Chew 1 tablet by mouth as needed for indigestion or heartburn.   Yes [provider]  citalopram (CELEXA) 40 MG tablet Take 40 mg by mouth every evening. 02/13/18  Yes [provider]  dapagliflozin propanediol (FARXIGA) 10 MG TABS tablet Take 10 mg by mouth every morning.   Yes [provider]  ferrous sulfate 325 (65 FE) MG tablet Take 325 mg by mouth daily with breakfast.   Yes [provider]  Fluticasone-Salmeterol (ADVAIR) 250-50 MCG/DOSE AEPB Inhale 1 puff into the lungs 2 (two) times daily as  needed (shortness of breath).   Yes [provider]  levocetirizine (XYZAL) 5 MG tablet Take 5 mg by mouth every evening. 01/26/22  Yes [provider]  levothyroxine (SYNTHROID) 88 MCG tablet Take 88 mcg by mouth daily before breakfast.   Yes [provider]  lisinopril (PRINIVIL,ZESTRIL) 40 MG tablet Take 40 mg by mouth every morning.   Yes [provider]  metFORMIN (GLUCOPHAGE) 1000 MG tablet Take 1,000 mg by mouth 2 (two) times daily with a meal.   Yes [provider]  montelukast (SINGULAIR) 10 MG tablet Take 10 mg by mouth at bedtime.   Yes [provider]  predniSONE (DELTASONE) 10 MG tablet Take 6 tabs  on day 1, 5 tabs on day 2, 4 tabs on day 3, 3 tabs on day 4, 2 tabs on day 5, 1 tab on day 6 03/27/23  Yes Baity, Salvadore Oxford, NP  rosuvastatin (CRESTOR) 10 MG tablet Take 10 mg by mouth at bedtime.   Yes [provider]  neomycin-polymyxin-hydrocortisone (CORTISPORIN) 3.5-10000-1 OTIC suspension Place 4 drops into the left ear 3 (three) times daily. For 7 days Patient not taking: Reported on 03/30/2023 04/25/22   Rodriguez-Southworth, Nettie Elm, PA-C   No results found.  Positive ROS: All other systems have been reviewed and were otherwise negative with the exception of those mentioned in the HPI and as above.  Physical Exam: General:  Alert, no acute distress Psychiatric:  Patient is competent for consent with normal mood and affect   Cardiovascular:  No pedal edema Respiratory:  No wheezing, non-labored breathing GI:  Abdomen is soft and non-tender Skin:  No lesions in the area of chief complaint Neurologic:  Sensation intact distally Lymphatic:  No axillary or cervical lymphadenopathy  Orthopedic Exam:  Orthopedic examination is limited to the right hand and forearm.  The patient exhibits 2+ swelling involving the entire hand both dorsally and palmarly extending into the index and long fingers more so than the ring and little  fingers as well as extending up into the forearm to her elbow.  There is mild erythema noted as well.  There is a small area of abrasion on the volar ulnar aspect of the index MCP flexion crease which appears to be well-healed and without any drainage or unique erythema.  She exhibits moderate tenderness to palpation over the palmar aspect of her hand in the area of the index metacarpal.  There is more mild tenderness diffusely throughout the rest of the hand and forearm.  She is able to actively flex and extend all digits with some discomfort with index flexion and extension.  Motion of all digits is limited by swelling.  She can tolerate passive flexion and extension of all digits although she does note some discomfort volarly with index flexion and extension.  Each PIP and DIP joint can be passively moved freely without pain, as can the thumb IP joint.  The index MCP joint can be ranged from 25 to 75 degrees with mild-moderate at the extremes of flexion and extension.  All other MCP joints can be moved passively from 0 to 90 degrees without pain.  She is able to tolerate wrist flexion to 35 degrees and extension to 30 degrees with mild discomfort at the extremes of flexion and extension.  The dorsal and volar forearm compartments are soft.  She is grossly neurovascularly intact all digits.  X-rays:  Recent AP, lateral, and oblique views of the right hand are available for review and have been reviewed by myself.  By report, the study demonstrates evidence of prominent degenerative changes of the long DIP joint as well as mild degenerative changes of the index DIP joint.  Significant soft tissue swelling of the hand and index finger also are identified.  No lytic lesions or acute bony processes are identified.  Assessment: Right hand cellulitis.  Cannot rule out early flexor tenosynovitis of the index finger versus abscess.  Plan: The treatment options have been reviewed with the patient and her husband,  who is at the bedside.  The patient will be sent for a stat CT scan of the right hand to evaluate for any abscess formation.  Meanwhile, she will be allowed to  eat today but I will keep her n.p.o. after midnight just in case surgical intervention may be indicated tomorrow.  She is encouraged to keep her hand elevated and to apply ice frequently.  She is to continue her IV antibiotic regimen as prescribed by the hospitalist.  I will reevaluate her tomorrow morning and decide if we need to continue to observe versus proceeding with surgical intervention.  Thank you for asking me to participate in the care of this most pleasant yet unfortunate woman.  I will be happy to follow her with you.   Maryagnes Amos, MD Beeper #:  [phone]  03/30/2023 3:51 PM   Addendum: The patient CT scan shows what appears to be an evolving abscess developing between the second and third metacarpals.  Therefore, I feel that the patient would benefit from a formal irrigation debridement of this abscess in her right hand.  This procedure has been discussed in detail with the patient, as have the potential risks (including bleeding, persistent or recurrent infection, nerve and/or blood vessel injury, persistent or recurrent pain, stiffness of the hand, need for further surgery, blood clots, strokes, heart attacks and/or arrhythmias, etc.) and benefits.  The patient states her understanding and is willing to proceed with this surgery.  A formal written consent will be obtained by the nursing staff.   Maryagnes Amos, MD Beeper #:  [phone]  03/30/2023 7:00 PM

## 2023-03-30 NOTE — Anesthesia Preprocedure Evaluation (Signed)
Anesthesia Evaluation  Patient identified by MRN, date of birth, ID band Patient awake    Reviewed: Allergy & Precautions, NPO status , Patient's Chart, lab work & pertinent test results  History of Anesthesia Complications Negative for: history of anesthetic complications  Airway Mallampati: IV   Neck ROM: Full    Dental  (+) Partial Upper, Dental Advidsory Given, Teeth Intact Upper partial; lower molars missing:   Pulmonary neg shortness of breath, asthma , COPD, neg recent URI, former smoker   Pulmonary exam normal breath sounds clear to auscultation       Cardiovascular hypertension, (-) angina (-) Past MI and (-) Cardiac Stents Normal cardiovascular exam(-) dysrhythmias (-) Valvular Problems/Murmurs Rhythm:Regular Rate:Normal  ECG 10/15/21: normal   Neuro/Psych  PSYCHIATRIC DISORDERS Anxiety Depression    negative neurological ROS     GI/Hepatic ,GERD  ,,  Endo/Other  diabetes, Type 2Hypothyroidism    Renal/GU Renal disease (stage III CKD)     Musculoskeletal  (+) Arthritis ,    Abdominal   Peds  Hematology negative hematology ROS (+)   Anesthesia Other Findings Past Medical History: No date: Anemia No date: Anxiety No date: Arthritis     Comment:  knee (R) No date: Asthma No date: Chronic kidney disease     Comment:  Stage 3 sees Lateef No date: COPD (chronic obstructive pulmonary disease) (HCC) No date: Depression No date: Diabetes mellitus without complication (HCC) No date: Dyspnea No date: GERD (gastroesophageal reflux disease)     Comment:  occ No date: Hypertension No date: Hypothyroidism No date: Increased serum lipids No date: Wears contact lenses   Reproductive/Obstetrics                             Anesthesia Physical Anesthesia Plan  ASA: 3 and emergent  Anesthesia Plan: General   Post-op Pain Management:    Induction: Intravenous  PONV Risk Score and  Plan: 3 and Ondansetron, Dexamethasone and Treatment may vary due to age or medical condition  Airway Management Planned: Oral ETT and LMA  Additional Equipment:   Intra-op Plan:   Post-operative Plan: Extubation in OR  Informed Consent: I have reviewed the patients History and Physical, chart, labs and discussed the procedure including the risks, benefits and alternatives for the proposed anesthesia with the patient or authorized representative who has indicated his/her understanding and acceptance.     Dental advisory given  Plan Discussed with: CRNA  Anesthesia Plan Comments: (Plan for preoperative interscalene nerve block and GETA.  Patient consented for risks of anesthesia including but not limited to:  - adverse reactions to medications - damage to eyes, teeth, lips or other oral mucosa - nerve damage due to positioning  - sore throat or hoarseness - damage to heart, brain, nerves, lungs, other parts of body or loss of life  Informed patient about role of CRNA in peri- and intra-operative care.  Patient voiced understanding.)        Anesthesia Quick Evaluation

## 2023-03-30 NOTE — Consult Note (Signed)
Pharmacy Antibiotic Note  Wanda Smith is a 62 y.o. female admitted on 03/30/2023 with sepsis and cellulitis. PMH significant for hypothyroidism, HTN, COPD, depression, anxiety, HLD, T2DM, CKD3a. Patient presented to the ED for evaluation of atraumatic erythema and swelling of the right dorsum of her hand and wrist that did not improve with steroids prescribed after another recent ED visit. Pharmacy has been consulted for vancomycin dosing.  Plan: Day 1 of antibiotics Give vancomycin 1500 mg IV x1 followed by 750 mg IV Q24H. Goal AUC 400-550. Expected AUC: 428.5 Expected Css min: 11.5 SCr used: 1.29   Weight used: IBW, Vd used: 0.72 (BMI 24.7) Patient is also on ceftriaxone 1 g IV Q24H Continue to monitor renal function and follow culture results   Height: 5\' 4"  (162.6 cm) Weight: 65.3 kg (144 lb) IBW/kg (Calculated) : 54.7  Temp (24hrs), Avg:98.8 F (37.1 C), Min:98.7 F (37.1 C), Max:98.8 F (37.1 C)  Recent Labs  Lab 03/30/23 0032  WBC 16.1*  CREATININE 1.29*  LATICACIDVEN 1.2    Estimated Creatinine Clearance: 39.5 mL/min (A) (by C-G formula based on SCr of 1.29 mg/dL (H)).    Allergies  Allergen Reactions   Pravastatin     Abnormal liver function tests   Tramadol Nausea Only    Antimicrobials this admission: 7/24 Vancomycin >>  7/24 Ceftriaxone >>   Dose adjustments this admission: N/A  Microbiology results: 7/24 BCx: NG<12H  Thank you for allowing pharmacy to be a part of this patient's care.  Celene Squibb, PharmD Clinical Pharmacist 03/30/2023 8:08 AM

## 2023-03-30 NOTE — H&P (Signed)
History and Physical    Wanda Smith QIO:[id number] DOB: April 28, 1961 DOA: 03/30/2023  Referring MD/NP/PA:   PCP: Patrice Paradise, MD   Patient coming from:  The patient is coming from home.     Chief Complaint: right hand pain, redness and swelling  HPI: Wanda Smith is a 62 y.o. female with medical history significant of HTN, HLD, DM, COPD, hypothyroidism, depression with anxiety, CKD-3A, anemia, who presents with right hand pain, redness and swelling.  Pt states that she has right hand pain, redness and swelling for more than 3 days. She states that initially the pain and swelling started at the at the base of her index finger on her right hand. She had a small scratch in that area that she sustained about 1 week ago that seem to be healing just fine. The swelling and redness have been progressively worsening, spreading to up to whole dorsal side of right hand.  The pain is constant, sharp, moderate to severe, nonradiating.  Patient was seen in urgent care 7/21 and had x-ray of right index finger which showed soft tissue swelling.  Patient had normal uric acid level 5.7.  She was given prednisone empirically, without any improvement, actually is getting worse. Patient did not have fever at home, but developed fever 101.3 after arrival to floor.  Patient does not have chest pain, cough, shortness of breath.  No nausea, vomiting, diarrhea or abdominal pain.  No symptoms of UTI.   Data reviewed independently and ED Course: pt was found to have WBC 16.1, lactic acid 1.2, renal function close to baseline, abnormal liver function (with ALP 103, AST 59, ALT 99, total bilirubin 0.4).  Blood pressure 152/90, heart rate 81, RR 22> -20, oxygen saturation 96% on room air.  Patient is admitted to MedSurg bed as inpatient.  Dr. Joice Lofts of Ortho is consulted.   CT-right hand 1. Marked soft tissue edema about the second digit and dorsum of the hand suggesting cellulitis. 2. There is a  peripherally enhancing hypodense area between the second and third metacarpals soft tissues measuring 0.9 x 0.3 x 4.0 cm, suspicious for developing abscess. Clinical correlation and continued follow-up examination is suggested. 3. No evidence of fracture or osteonecrosis  EKG:   Not done in ED, will get one.     Review of Systems:   General: has fevers, no chills, no body weight gain, has fatigue HEENT: no blurry vision, hearing changes or sore throat Respiratory: no dyspnea, coughing, wheezing CV: no chest pain, no palpitations GI: no nausea, vomiting, abdominal pain, diarrhea, constipation GU: no dysuria, burning on urination, increased urinary frequency, hematuria  Ext: no leg edema. Has right hand pain, redness and swelling Neuro: no unilateral weakness, numbness, or tingling, no vision change or hearing loss Skin: no rash, no skin tear. MSK: No muscle spasm, no deformity, no limitation of range of movement in spin Heme: No easy bruising.  Travel history: No recent long distant travel.   Allergy:  Allergies  Allergen Reactions   Pravastatin     Abnormal liver function tests   Tramadol Nausea Only    Past Medical History:  Diagnosis Date   Anemia    Anxiety    Arthritis    knee (R)   Asthma    Chronic kidney disease    Stage 3 sees Lateef   COPD (chronic obstructive pulmonary disease) (HCC)    Depression    Diabetes mellitus without complication (HCC)    Dyspnea  GERD (gastroesophageal reflux disease)    occ   Hypertension    Hypothyroidism    Increased serum lipids    Wears contact lenses     Past Surgical History:  Procedure Laterality Date   ABDOMINAL HYSTERECTOMY     CARPAL TUNNEL RELEASE Right 07/26/2018   Procedure: CARPAL TUNNEL RELEASE ENDOSCOPIC;  Surgeon: Christena Flake, MD;  Location: [facility];  Service: Orthopedics;  Laterality: Right;  diabetic - oral meds   CHOLECYSTECTOMY     COLONOSCOPY WITH ESOPHAGOGASTRODUODENOSCOPY (EGD)      COLONOSCOPY WITH PROPOFOL N/A 01/13/2018   Procedure: COLONOSCOPY WITH PROPOFOL;  Surgeon: Scot Jun, MD;  Location[facility];  Service: Endoscopy;  Laterality: N/A;   HERNIA REPAIR     left rotator cuff repair Left    SHOULDER ARTHROSCOPY WITH SUBACROMIAL DECOMPRESSION, ROTATOR CUFF REPAIR AND BICEP TENDON REPAIR Right 10/21/2021   Procedure: SHOULDER ARTHROSCOPY WITH DEBRIDEMENT, DECOMPRESSION, BICEPS TENODESIS.;  Surgeon: Christena Flake, MD;  Location: [facility];  Service: Orthopedics;  Laterality: Right;   TUBAL LIGATION      Social History:  reports that she quit smoking about 15 years ago. Her smoking use included cigarettes. She started smoking about 35 years ago. She has a 15 pack-year smoking history. She has never used smokeless tobacco. She reports current alcohol use of about 2.0 standard drinks of alcohol per week. She reports that she does not use drugs.  Family History:  Family History  Problem Relation Age of Onset   Breast cancer Mother 43   Breast cancer Sister 72   Diabetes Sister    Diabetes Father    Diabetes Brother      Prior to Admission medications   Medication Sig Start Date End Date Taking? Authorizing Provider  albuterol (PROVENTIL HFA;VENTOLIN HFA) 108 (90 Base) MCG/ACT inhaler Inhale 1-2 puffs into the lungs every 6 (six) hours as needed for wheezing or shortness of breath.    [provider]  calcium carbonate (TUMS - DOSED IN MG ELEMENTAL CALCIUM) 500 MG chewable tablet Chew 1 tablet by mouth as needed for indigestion or heartburn.    [provider]  citalopram (CELEXA) 40 MG tablet Take 40 mg by mouth every evening. 02/13/18   [provider]  dapagliflozin propanediol (FARXIGA) 10 MG TABS tablet Take 10 mg by mouth every morning.    [provider]  ferrous sulfate 325 (65 FE) MG tablet Take 325 mg by mouth daily with breakfast.    [provider]  Fluticasone-Salmeterol (ADVAIR) 250-50 MCG/DOSE  AEPB Inhale 1 puff into the lungs 2 (two) times daily as needed (shortness of breath).    [provider]  levothyroxine (SYNTHROID) 88 MCG tablet Take 88 mcg by mouth daily before breakfast.    [provider]  lisinopril (PRINIVIL,ZESTRIL) 40 MG tablet Take 40 mg by mouth every morning.    [provider]  metFORMIN (GLUCOPHAGE) 1000 MG tablet Take 1,000 mg by mouth 2 (two) times daily with a meal.    [provider]  montelukast (SINGULAIR) 10 MG tablet Take 10 mg by mouth at bedtime.    [provider]  neomycin-polymyxin-hydrocortisone (CORTISPORIN) 3.5-10000-1 OTIC suspension Place 4 drops into the left ear 3 (three) times daily. For 7 days 04/25/22   Rodriguez-Southworth, Nettie Elm, PA-C  predniSONE (DELTASONE) 10 MG tablet Take 6 tabs on day 1, 5 tabs on day 2, 4 tabs on day 3, 3 tabs on day 4, 2 tabs on day 5,  1 tab on day 6 03/27/23   Lorre Munroe, NP  rosuvastatin (CRESTOR) 10 MG tablet Take 10 mg by mouth at bedtime.    [provider]    Physical Exam: Vitals:   03/30/23 0658 03/30/23 0814 03/30/23 1559 03/30/23 1842  BP: (!) 152/90 (!) 146/89 (!) 88/61 (!) 99/59  Pulse: 80 79 75 70  Resp: 20 14 14 16   Temp:  (!) 101.3 F (38.5 C) 98.8 F (37.1 C) 99 F (37.2 C)  TempSrc:      SpO2: 96% 100% 95% 96%  Weight:      Height:       General: Not in acute distress HEENT:       Eyes: PERRL, EOMI, no jaundice       ENT: No discharge from the ears and nose, no pharynx injection, no tonsillar enlargement.        Neck: No JVD, no bruit, no mass felt. Heme: No neck lymph node enlargement. Cardiac: S1/S2, RRR, No murmurs, No gallops or rubs. Respiratory: No rales, wheezing, rhonchi or rubs. GI: Soft, nondistended, nontender, no rebound pain, no organomegaly, BS present. GU: No hematuria Ext: No pitting leg edema bilaterally. 1+DP/PT pulse bilaterally. Pt has tenderness, redness, swelling, warmth in right hand, mainly in dorsal side  and index finger.     Musculoskeletal: No joint deformities, No joint redness or warmth, no limitation of ROM in spin. Skin: No rashes.  Neuro: Alert, oriented X3, cranial nerves II-XII grossly intact, moves all extremities normally. Psych: Patient is not psychotic, no suicidal or hemocidal ideation.  Labs on Admission: I have personally reviewed following labs and imaging studies  CBC: Recent Labs  Lab 03/30/23 0032  WBC 16.1*  NEUTROABS 13.6*  HGB 12.5  HCT 39.0  MCV 94.4  PLT 390   Basic Metabolic Panel: Recent Labs  Lab 03/30/23 0032  NA 134*  K 3.7  CL 103  CO2 25  GLUCOSE 128*  BUN 32*  CREATININE 1.29*  CALCIUM 9.2   GFR: Estimated Creatinine Clearance: 39.5 mL/min (A) (by C-G formula based on SCr of 1.29 mg/dL (H)). Liver Function Tests: Recent Labs  Lab 03/30/23 0032  AST 59*  ALT 99*  ALKPHOS 103  BILITOT 0.4  PROT 8.0  ALBUMIN 4.9   No results for input(s): "LIPASE", "AMYLASE" in the last 168 hours. No results for input(s): "AMMONIA" in the last 168 hours. Coagulation Profile: No results for input(s): "INR", "PROTIME" in the last 168 hours. Cardiac Enzymes: No results for input(s): "CKTOTAL", "CKMB", "CKMBINDEX", "TROPONINI" in the last 168 hours. BNP (last 3 results) No results for input(s): "PROBNP" in the last 8760 hours. HbA1C: Recent Labs    03/30/23 0032  HGBA1C 6.1*   CBG: Recent Labs  Lab 03/30/23 0819 03/30/23 1254 03/30/23 1708  GLUCAP 131* 101* 96   Lipid Profile: No results for input(s): "CHOL", "HDL", "LDLCALC", "TRIG", "CHOLHDL", "LDLDIRECT" in the last 72 hours. Thyroid Function Tests: No results for input(s): "TSH", "T4TOTAL", "FREET4", "T3FREE", "THYROIDAB" in the last 72 hours. Anemia Panel: No results for input(s): "VITAMINB12", "FOLATE", "FERRITIN", "TIBC", "IRON", "RETICCTPCT" in the last 72 hours. Urine analysis: No results found for: "COLORURINE", "APPEARANCEUR", "LABSPEC", "PHURINE", "GLUCOSEU", "HGBUR",  "BILIRUBINUR", "KETONESUR", "PROTEINUR", "UROBILINOGEN", "NITRITE", "LEUKOCYTESUR" Sepsis Labs: @LABRCNTIP (procalcitonin:4,lacticidven:4) ) Recent Results (from the past 240 hour(s))  Blood culture (routine x 2)     Status: None (Preliminary result)   Collection Time: 03/30/23 12:32 AM   Specimen: BLOOD  Result Value Ref Range Status  Specimen Description BLOOD LEFT AC  Final   Special Requests   Final    BOTTLES DRAWN AEROBIC AND ANAEROBIC Blood Culture results may not be optimal due to an excessive volume of blood received in culture bottles   Culture   Final    NO GROWTH < 12 HOURS Performed at [facility], [address] [place], [place] [address]    Report Status PENDING  Incomplete  Blood culture (routine x 2)     Status: None (Preliminary result)   Collection Time: 03/30/23  4:01 AM   Specimen: BLOOD  Result Value Ref Range Status   Specimen Description BLOOD LEFT ARM  Final   Special Requests   Final    BOTTLES DRAWN AEROBIC AND ANAEROBIC Blood Culture results may not be optimal due to an inadequate volume of blood received in culture bottles   Culture   Final    NO GROWTH < 12 HOURS Performed at [facility], [address] [place], [place] [address]    Report Status PENDING  Incomplete     Radiological Exams on Admission: CT HAND RIGHT W CONTRAST  Result Date: 03/30/2023 CLINICAL DATA:  Right hand cellulitis, evaluate for abscess. EXAM: CT OF THE UPPER RIGHT EXTREMITY WITH CONTRAST TECHNIQUE: Multidetector CT imaging of the upper right extremity was performed according to the standard protocol following intravenous contrast administration. RADIATION DOSE REDUCTION: This exam was performed according to the departmental dose-optimization program which includes automated exposure control, adjustment of the mA and/or kV according to patient size and/or use of iterative reconstruction technique. CONTRAST:  OMNIPAQUE IOHEXOL 300 MG/ML  SOLN  COMPARISON:  None Available. FINDINGS: Bones/Joint/Cartilage Osseous structures are within normal limits. No evidence of fracture or osteonecrosis. Scattered cystic changes in the carpal bones, otherwise no significant arthropathy. Ligaments Suboptimally assessed by CT. Muscles and Tendons Thenar and hypothenar muscles are normal in bulk. No intramuscular hematoma or fluid collection. There is a peripherally enhancing hypodense area in the between the second and third metacarpals measuring a proximally 0.9 x 0.3 x 4.0 cm (series 5, image 53; series 8, image 19) suspicious for developing abscess. Soft tissues Marked soft tissue edema about the second digit and dorsum of the hand suggesting cellulitis. IMPRESSION: 1. Marked soft tissue edema about the second digit and dorsum of the hand suggesting cellulitis. 2. There is a peripherally enhancing hypodense area between the second and third metacarpals soft tissues measuring 0.9 x 0.3 x 4.0 cm, suspicious for developing abscess. Clinical correlation and continued follow-up examination is suggested. 3. No evidence of fracture or osteonecrosis. Electronically Signed   By: Larose Hires D.O.   On: 03/30/2023 17:47      Assessment/Plan Principal Problem:   Cellulitis of right hand Active Problems:   Severe sepsis (HCC)   Abnormal LFTs   Type II diabetes mellitus with renal manifestations (HCC)   Hypothyroidism   Hypertension   HLD (hyperlipidemia)   COPD (chronic obstructive pulmonary disease) (HCC)   Chronic kidney disease, stage 3a (HCC)   Depression with anxiety   Assessment and Plan:  Severe sepsis due to cellulitis of right hand: Ct showed possible small abscess formation.  Patient has severe sepsis with WBC 16.1, fever of 101.3, RR up to 22.  Lactic acid 1.2 --> 1.6 --> 2.4.  Procalcitonin 0.16.  Patient developed hypotension with blood pressure 88/61, which has responded to IV fluid resuscitation, and treatment with albumin, midodrine and  Solu-Cortef.  Consulted Dr. Joice Lofts of ortho.   -  will admit to med-surg bed as inpatient - Empiric antimicrobial treatment with vancomycin and Rocephin - PRN Zofran for nausea, morphine and oxycodone for pain - Blood cultures x 2  - ESR and CRP - will  trend lactic acid levels per sepsis protocol. - IVF: Aggressive IV fluid resuscitation - Midodrine 10 mg 3 times daily, - Solu-Medrol 100 mg x 1 - Albumin 25 mg  Abnormal LFTs: mild.  Likely due to sepsis, Tylenol level less than 10. -Careful use of Tylenol -Check hepatitis panel  Type II diabetes mellitus with renal manifestations [facility]): Recent A1c 6.3, well-controlled.  Patient is taking metformin and fark scar -Sliding scale insulin  Hypothyroidism -Synthroid  Hypertension -Hold lisinopril due to severe sepsis  HLD (hyperlipidemia) -Crestor  COPD (chronic obstructive pulmonary disease) (HCC): Stable -As needed bronchodilators  Chronic kidney disease, stage 3a (HCC): Stable. -Follow-up with BMP  Depression with anxiety -Continue home medications    DVT ppx: SCD  Code Status: Full code   Family Communication:  Yes, patient's son at bed side.   Disposition Plan:  Anticipate discharge back to previous environment  Consults called:  Dr. Joice Lofts of Ortho is consulted.  Admission status and Level of care: Med-Surg:    as inpt      Dispo: The patient is from: Home              Anticipated d/c is to: Home              Anticipated d/c date is: 2 days              Patient currently is not medically stable to d/c.    Severity of Illness:  The appropriate patient status for this patient is INPATIENT. Inpatient status is judged to be reasonable and necessary in order to provide the required intensity of service to ensure the patient's safety. The patient's presenting symptoms, physical exam findings, and initial radiographic and laboratory data in the context of their chronic comorbidities is felt to place them at high  risk for further clinical deterioration. Furthermore, it is not anticipated that the patient will be medically stable for discharge from the hospital within 2 midnights of admission.   * I certify that at the point of admission it is my clinical judgment that the patient will require inpatient hospital care spanning beyond 2 midnights from the point of admission due to high intensity of service, high risk for further deterioration and high frequency of surveillance required.*       Date of Service 03/30/2023    Lorretta Harp [facility]   If 7PM-7AM, please contact night-coverage [email or web address] 03/30/2023, 7:17 PM

## 2023-03-30 NOTE — Transfer of Care (Signed)
Immediate Anesthesia Transfer of Care Note  Patient: Wanda Smith  Procedure(s) Performed: INCISION AND DRAINAGE ABSCESS RIGHT HAND (Right: Hand)  Patient Location: PACU  Anesthesia Type:General  Level of Consciousness: drowsy and patient cooperative  Airway & Oxygen Therapy: Patient Spontanous Breathing and Patient connected to face mask oxygen  Post-op Assessment: Report given to RN and Post -op Vital signs reviewed and stable  Post vital signs: Reviewed and stable  Last Vitals:  Vitals Value Taken Time  BP 90/55 03/30/23 2045  Temp 36.3 C 03/30/23 2045  Pulse 77 03/30/23 2052  Resp 14 03/30/23 2052  SpO2 100 % 03/30/23 2052  Vitals shown include unfiled device data.  Last Pain:  Vitals:   03/30/23 2045  TempSrc:   PainSc: Asleep         Complications: No notable events documented.

## 2023-03-30 NOTE — Anesthesia Procedure Notes (Signed)
Procedure Name: LMA Insertion Date/Time: 03/30/2023 7:23 PM  Performed by: Omer Jack, CRNAPre-anesthesia Checklist: Patient identified, Patient being monitored, Timeout performed, Emergency Drugs available and Suction available Patient Re-evaluated:Patient Re-evaluated prior to induction Oxygen Delivery Method: Circle system utilized Preoxygenation: Pre-oxygenation with 100% oxygen Induction Type: IV induction Ventilation: Mask ventilation without difficulty LMA: LMA inserted LMA Size: 3.0 Tube type: Oral Number of attempts: 1 Placement Confirmation: positive ETCO2 and breath sounds checked- equal and bilateral Tube secured with: Tape Dental Injury: Teeth and Oropharynx as per pre-operative assessment

## 2023-03-30 NOTE — Progress Notes (Signed)
CODE SEPSIS - PHARMACY COMMUNICATION  **Broad Spectrum Antibiotics should be administered within 1 hour of Sepsis diagnosis**  Time Code Sepsis Called/Page Received: 4098  Antibiotics Ordered: Ceftriaxone  Time of 1st antibiotic administration: 0354  Otelia Sergeant, PharmD, [facility] 03/30/2023 5:40 AM

## 2023-03-30 NOTE — Anesthesia Postprocedure Evaluation (Signed)
Anesthesia Post Note  Patient: Wanda Smith  Procedure(s) Performed: INCISION AND DRAINAGE ABSCESS RIGHT HAND (Right: Hand)  Patient location during evaluation: PACU Anesthesia Type: General Level of consciousness: awake and alert Pain management: pain level controlled Vital Signs Assessment: post-procedure vital signs reviewed and stable Respiratory status: spontaneous breathing, nonlabored ventilation, respiratory function stable and patient connected to nasal cannula oxygen Cardiovascular status: blood pressure returned to baseline and stable Postop Assessment: no apparent nausea or vomiting Anesthetic complications: no   No notable events documented.   Last Vitals:  Vitals:   03/30/23 2147 03/30/23 2152  BP: (!) 91/57 (!) 90/58  Pulse: 66 68  Resp: 10 11  Temp:    SpO2: 93% 93%    Last Pain:  Vitals:   03/30/23 2134  TempSrc:   PainSc: 5                  Lenard Simmer

## 2023-03-30 NOTE — Plan of Care (Signed)

## 2023-03-30 NOTE — Op Note (Signed)
03/30/2023  8:43 PM  Patient:   Wanda Smith  Pre-Op Diagnosis:   Right hand cellulitis with abscess  Post-Op Diagnosis:   Same  Procedure:   Irrigation and debridement of right hand abscess with I&D of right index flexor sheath.  Surgeon:   Maryagnes Amos, MD  Assistant:   None  Anesthesia:   General LMA  Findings:   As above.  Complications:   None  Fluids:   600 cc crystalloid  EBL:   5 cc  UOP:   None  TT:   60 minutes at 250 mmHg  Drains:   Penrose x 1  Closure:   4-0 Prolene interrupted sutures  Brief Clinical Note:   The patient is a 63 year old female who presented to the emergency room last evening with a 4-day history of progressively worsening pain and swelling of her right hand following an incident when she apparently was scratched by her dog.  Initially, she was seen by an urgent care facility and put on steroids for the swelling.  Her symptoms worsened, prompting her to present to the emergency room where she subsequently was admitted for IV antibiotics and further workup.  A CT scan demonstrated the presence of an evolving abscess between the index and long metacarpals.  The patient presents at this time for irrigation and debridement of the right hand abscess.  Procedure:   The patient was brought into the operating room and laid in the supine position.  After adequate general laryngeal mask anesthesia was obtained, the patient's right hand and upper extremity were prepped with ChloraPrep solution before being draped sterilely.  Preoperative antibiotics were administered.  The hand was held in an elevated position for several minutes during which time a timeout was performed to verify the appropriate surgical site.  The tourniquet was then inflated to 250 mmHg.  A Bruner type zigzag incision was made beginning at the thenar eminence proximally and extending to just proximal to the PIP flexion crease distally, incorporating the site where the dog had  scratched her.  The incision was carried down through the subcutaneous tissues with care taken to identify and protect the important digital neurovascular structures.  There was clear evidence of purulence just deep to the site of the dog scratch.  Two separate cultures of this area were obtained before the clearly purulent and questionably necrotic tissues were carefully debrided sharply with iris scissors.  Care was taken to protect the adjacent digital neurovascular structures.  The wound was copiously irrigated with 1.5 L of sterile saline solution using bulb irrigation.  The flexor sheath appeared to be intact, pink but because of her preoperative examination, it was felt best to open the sheath to be sure that no pus had entered the sheath.  The sheath was opened proximal to the A1 pulley and proximal to the A2 pulley.  And neither location was any purulent material identified.  Regardless, the wound again was irrigated with an additional 500 cc of sterile saline solution.    The wound was closed using 4-0 Prolene interrupted sutures.  A Penrose drain was placed at the site of the dog scratch with one limb extending distally and one limb extending proximally.  A sterile bulky dressing was then applied to the hand before the hand and forearm were placed into a volar splint maintaining the wrist and hand in neutral position.  The patient was then awakened, extubated, and returned to the recovery room in satisfactory condition after tolerating the procedure  well.

## 2023-03-30 NOTE — ED Provider Notes (Signed)
[facility] Provider Note    Event Date/Time   First MD Initiated Contact with Patient 03/30/23 0300     (approximate)   History   Cellulitis   HPI  Wanda Smith is a 62 y.o. female who presents to the ED for evaluation of Cellulitis   Patient presents to the ED for evaluation of atraumatic erythema and swelling of the right dorsum of her hand and wrist.  Seen as an outpatient a couple days ago and prescribed steroids for possibility of joint swelling  She is a history of DM and CKD at baseline.   Physical Exam   Triage Vital Signs: ED Triage Vitals [03/30/23 0027]  Encounter Vitals Group     BP (!) 150/93     Systolic BP Percentile      Diastolic BP Percentile      Pulse Rate 77     Resp 20     Temp 98.8 F (37.1 C)     Temp Source Oral     SpO2 100 %     Weight 144 lb (65.3 kg)     Height 5\' 4"  (1.626 m)     Head Circumference      Peak Flow      Pain Score 10     Pain Loc      Pain Education      Exclude from Growth Chart     Most recent vital signs: Vitals:   03/30/23 0500 03/30/23 0528  BP:  (!) 145/79  Pulse: 77   Resp: (!) 21   Temp:    SpO2: 98%     General: Awake, no distress.  CV:  Good peripheral perfusion.  Resp:  Normal effort.  Abd:  No distention.  MSK:  No deformity noted.  Neuro:  No focal deficits appreciated. Other:  As pictured below, close swelling, erythema and sheen to the skin consistent with cellulitis throughout the proximal first 3 fingers, hand and distal forearm, streaking proximally.  Tender to palpation.  No palmar derangements or signs of flexor tenosynovitis     ED Results / Procedures / Treatments   Labs (all labs ordered are listed, but only abnormal results are displayed) Labs Reviewed  CBC WITH DIFFERENTIAL/PLATELET - Abnormal; Notable for the following components:      Result Value   WBC 16.1 (*)    Neutro Abs 13.6 (*)    Monocytes Absolute 1.2 (*)    Abs Immature  Granulocytes 0.08 (*)    All other components within normal limits  COMPREHENSIVE METABOLIC PANEL - Abnormal; Notable for the following components:   Sodium 134 (*)    Glucose, Bld 128 (*)    BUN 32 (*)    Creatinine, Ser 1.29 (*)    AST 59 (*)    ALT 99 (*)    GFR, Estimated 47 (*)    All other components within normal limits  CULTURE, BLOOD (ROUTINE X 2)  CULTURE, BLOOD (ROUTINE X 2)  LACTIC ACID, PLASMA    EKG   RADIOLOGY   Official radiology report(s): No results found.  PROCEDURES and INTERVENTIONS:  .Critical Care  Performed by: Delton Prairie, MD Authorized by: Delton Prairie, MD   Critical care provider statement:    Critical care time (minutes):  30   Critical care time was exclusive of:  Separately billable procedures and treating other patients   Critical care was necessary to treat or prevent imminent or life-threatening deterioration of the following conditions:  Sepsis   Critical care was time spent personally by me on the following activities:  Development of treatment plan with patient or surrogate, discussions with consultants, evaluation of patient's response to treatment, examination of patient, ordering and review of laboratory studies, ordering and review of radiographic studies, ordering and performing treatments and interventions, pulse oximetry, re-evaluation of patient's condition and review of old charts   Medications  morphine (PF) 4 MG/ML injection 4 mg (4 mg Intravenous Given 03/30/23 0355)  cefTRIAXone (ROCEPHIN) 1 g in sodium chloride 0.9 % 100 mL IVPB (0 g Intravenous Stopped 03/30/23 0528)  ondansetron (ZOFRAN) 4 MG/2ML injection (4 mg  Given 03/30/23 0355)     IMPRESSION / MDM / ASSESSMENT AND PLAN / ED COURSE  I reviewed the triage vital signs and the nursing notes.  Differential diagnosis includes, but is not limited to, cellulitis, abscess, DVT, sepsis  {Patient presents with symptoms of an acute illness or injury that is potentially  life-threatening.  Patient presents with cellulitis of her hand.  She is an elevated respiratory rate and leukocytosis concerning for sepsis.  Cultures are drawn and lactic is normal.  White count of 16.  CKD near baseline with marginal worsening and elevated BUN, we will provide IV fluids, ceftriaxone and consult with medicine for admission due to continued symptoms and borderline sepsis  Clinical Course as of 03/30/23 0537  Wed Mar 30, 2023  1308 Review of care everywhere, baseline creatinine of 1-1.2 [DS]  0429 Reassessed improving pain, antibiotics ongoing. I brought a few pillows to elevate that right arm [DS]    Clinical Course User Index [DS] Delton Prairie, MD     FINAL CLINICAL IMPRESSION(S) / ED DIAGNOSES   Final diagnoses:  Cellulitis of hand, right     Rx / DC Orders   ED Discharge Orders     None        Note:  This document was prepared using Dragon voice recognition software and may include unintentional dictation errors.   Delton Prairie, MD 03/30/23 [phone]

## 2023-03-30 NOTE — Sepsis Progress Note (Signed)
Elink monitoring for the code sepsis protocol.  

## 2023-03-31 ENCOUNTER — Encounter: Payer: Self-pay | Admitting: Surgery

## 2023-03-31 DIAGNOSIS — A419 Sepsis, unspecified organism: Secondary | ICD-10-CM

## 2023-03-31 DIAGNOSIS — E1129 Type 2 diabetes mellitus with other diabetic kidney complication: Secondary | ICD-10-CM

## 2023-03-31 DIAGNOSIS — L02511 Cutaneous abscess of right hand: Secondary | ICD-10-CM | POA: Diagnosis not present

## 2023-03-31 DIAGNOSIS — R6520 Severe sepsis without septic shock: Secondary | ICD-10-CM

## 2023-03-31 DIAGNOSIS — L03113 Cellulitis of right upper limb: Secondary | ICD-10-CM | POA: Diagnosis not present

## 2023-03-31 LAB — BASIC METABOLIC PANEL
BUN: 18 mg/dL (ref 8–23)
CO2: 21 mmol/L — ABNORMAL LOW (ref 22–32)
Calcium: 7.8 mg/dL — ABNORMAL LOW (ref 8.9–10.3)
Chloride: 110 mmol/L (ref 98–111)
Creatinine, Ser: 1.13 mg/dL — ABNORMAL HIGH (ref 0.44–1.00)
GFR, Estimated: 55 mL/min — ABNORMAL LOW (ref >60)
Glucose, Bld: 190 mg/dL — ABNORMAL HIGH (ref 70–99)
Potassium: 4 mmol/L (ref 3.5–5.1)
Sodium: 138 mmol/L (ref 135–145)

## 2023-03-31 LAB — CBC
HCT: 31.5 % — ABNORMAL LOW (ref 36.0–46.0)
Hemoglobin: 10.1 g/dL — ABNORMAL LOW (ref 12.0–15.0)
MCH: 29.9 pg (ref 26.0–34.0)
MCHC: 32.1 g/dL (ref 30.0–36.0)
MCV: 93.2 fL (ref 80.0–100.0)
Platelets: 251 10*3/uL (ref 150–400)
RBC: 3.38 MIL/uL — ABNORMAL LOW (ref 3.87–5.11)
RDW: 14.4 % (ref 11.5–15.5)
WBC: 8.2 10*3/uL (ref 4.0–10.5)
nRBC: 0 % (ref 0.0–0.2)

## 2023-03-31 LAB — AEROBIC/ANAEROBIC CULTURE W GRAM STAIN (SURGICAL/DEEP WOUND)

## 2023-03-31 LAB — GLUCOSE, CAPILLARY
Glucose-Capillary: 124 mg/dL — ABNORMAL HIGH (ref 70–99)
Glucose-Capillary: 180 mg/dL — ABNORMAL HIGH (ref 70–99)
Glucose-Capillary: 161 mg/dL — ABNORMAL HIGH (ref 70–99)
Glucose-Capillary: 149 mg/dL — ABNORMAL HIGH (ref 70–99)
Glucose-Capillary: 228 mg/dL — ABNORMAL HIGH (ref 70–99)

## 2023-03-31 LAB — CULTURE, BLOOD (ROUTINE X 2)

## 2023-03-31 LAB — LACTIC ACID, PLASMA: Lactic Acid, Venous: 1.5 mmol/L (ref 0.5–1.9)

## 2023-03-31 MED ORDER — VANCOMYCIN HCL IN DEXTROSE 1-5 GM/200ML-% IV SOLN
1000.0000 mg | INTRAVENOUS | Status: DC
  Administered 2023-03-31: 1000 mg via INTRAVENOUS
  Filled 2023-03-31 (×2): qty 200

## 2023-03-31 NOTE — Consult Note (Addendum)
Pharmacy Antibiotic Note  Wanda Smith is a 62 y.o. female admitted on 03/30/2023 with sepsis and cellulitis. PMH significant for hypothyroidism, HTN, COPD, depression, anxiety, HLD, T2DM, CKD3a. Patient presented to the ED for evaluation of erythema and swelling of the right hand and wrist that did not improve with steroids prescribed after another recent ED visit. She sustained a scratch from her dog prior to the development of redness and erythema. Patient underwent I&D 7/24. There was clear evidence of purulence and questionable necrosis observed in the OR. Two separate cultures were obtained. Pharmacy has been consulted for vancomycin dosing.  Plan: Day 2 of antibiotics Increase to vancomycin 1000 mg IV Q24H per improved renal function. Goal AUC 400-550. Expected AUC: 508.0 Expected Css min: 12.8 SCr used: 1.13   Weight used: IBW, Vd used: 0.72 (BMI 24.7) Patient is also on ceftriaxone 1 g IV Q24H Continue to monitor renal function and follow culture results   Height: 5\' 4"  (162.6 cm) Weight: 65.3 kg (144 lb) IBW/kg (Calculated) : 54.7  Temp (24hrs), Avg:98.5 F (36.9 C), Min:97.3 F (36.3 C), Max:99 F (37.2 C)  Recent Labs  Lab 03/30/23 0032 03/30/23 1049 03/30/23 1225 03/30/23 1750 03/30/23 2221 03/31/23 0141  WBC 16.1*  --   --   --   --  8.2  CREATININE 1.29*  --   --   --   --  1.13*  LATICACIDVEN 1.2 1.6 2.4* 1.8 1.7 1.5    Estimated Creatinine Clearance: 45.1 mL/min (A) (by C-G formula based on SCr of 1.13 mg/dL (H)).    Allergies  Allergen Reactions   Pravastatin     Abnormal liver function tests   Tramadol Nausea Only    Antimicrobials this admission: 7/24 Vancomycin >>  7/24 Ceftriaxone >>   Dose adjustments this admission: 7/25 Vancomycin 750 mg IV Q24H >> Vancomycin 1000 mg IV Q24H   Microbiology results: 7/24 BCx: NG<12H 7/24 1945 OR Cx: rare GPC 7/24 1941 OR Cx: NGTD  Thank you for allowing pharmacy to be a part of this patient's  care.  Celene Squibb, PharmD Clinical Pharmacist 03/31/2023 8:52 AM

## 2023-03-31 NOTE — Progress Notes (Signed)
Subjective: 1 Day Post-Op Procedure(s) (LRB): INCISION AND DRAINAGE ABSCESS RIGHT HAND (Right) Patient reports pain as moderate but states it does feel better than prior to surgery. Patient is well, and has had no acute complaints or problems Plan is to go Home after hospital stay. Negative for chest pain and shortness of breath Fever: No recent fever, did have temp of 101.3 yesterday prior to surgery. Gastrointestinal:Negative for nausea and vomiting  Objective: Vital signs in last 24 hours: Temp:  [97.3 F (36.3 C)-99 F (37.2 C)] 98.3 F (36.8 C) (07/25 1535) Pulse Rate:  [54-76] 54 (07/25 1535) Resp:  [10-21] 15 (07/25 1535) BP: (88-117)/(55-81) 104/64 (07/25 1535) SpO2:  [90 %-100 %] 100 % (07/25 1535)  Intake/Output from previous day:  Intake/Output Summary (Last 24 hours) at 03/31/2023 1542 Last data filed at 03/30/2023 2214 Gross per 24 hour  Intake 770 ml  Output 105 ml  Net 665 ml    Intake/Output this shift: No intake/output data recorded.  Labs: Recent Labs    03/30/23 0032 03/31/23 0141  HGB 12.5 10.1*   Recent Labs    03/30/23 0032 03/31/23 0141  WBC 16.1* 8.2  RBC 4.13 3.38*  HCT 39.0 31.5*  PLT 390 251   Recent Labs    03/30/23 0032 03/31/23 0141  NA 134* 138  K 3.7 4.0  CL 103 110  CO2 25 21*  BUN 32* 18  CREATININE 1.29* 1.13*  GLUCOSE 128* 190*  CALCIUM 9.2 7.8*   No results for input(s): "LABPT", "INR" in the last 72 hours.   EXAM General - Patient is Alert, Appropriate, and Oriented Extremity - ABD soft She is keeping right hand elevated. Splint intact.  Intact to sensation to exposed fingers. She is able to gentle flex and extend fingers with mild pain. Negative homans to lower extremities.  Past Medical History:  Diagnosis Date   Anemia    Anxiety    Arthritis    knee (R)   Asthma    Chronic kidney disease    Stage 3 sees Lateef   COPD (chronic obstructive pulmonary disease) (HCC)    Depression    Diabetes  mellitus without complication (HCC)    Dyspnea    GERD (gastroesophageal reflux disease)    occ   Hypertension    Hypothyroidism    Increased serum lipids    Wears contact lenses     Assessment/Plan: 1 Day Post-Op Procedure(s) (LRB): INCISION AND DRAINAGE ABSCESS RIGHT HAND (Right) Principal Problem:   Cellulitis of right hand Active Problems:   Hypothyroidism   Hypertension   COPD (chronic obstructive pulmonary disease) (HCC)   Depression with anxiety   HLD (hyperlipidemia)   Type II diabetes mellitus with renal manifestations (HCC)   Chronic kidney disease, stage 3a (HCC)   Abnormal LFTs   Severe sepsis (HCC)   Abscess of right hand  Estimated body mass index is 24.72 kg/m as calculated from the following:   Height as of this encounter: 5\' 4"  (1.626 m).   Weight as of this encounter: 65.3 kg. Advance diet Up with therapy D/C IV fluids when tolerating po intake.  Labs reviewed, WBC 8.2.  No recent fevers. Currently on Vancomycin and Ancef.  Continue on Vancomycin. Cultures from surgery are pending.  Gram stain with rare gram positive cocci. Will plan on removing splint tomorrow and advancing/removing drain. Continue IV Abx, plan for possible d/c home on Saturday.  DVT Prophylaxis - Lovenox and TED hose Minimal weightbearing to the right arm.  Valeria Batman, PA-C [facility] Orthopaedic Surgery 03/31/2023, 3:42 PM

## 2023-03-31 NOTE — Progress Notes (Signed)
[facility]  - [place] [facility]   PATIENT NAME: Wanda Smith    MR#:  [id number]  DATE OF BIRTH:  March 02, 1961  SUBJECTIVE:  no family at bedside. Patient seen earlier. Feels bit better with right hand infection. Tolerating PO diet. Blood pressure much stable. Underwent incision and drainage last night with orthopedic.  VITALS:  Blood pressure 117/81, pulse 66, temperature 98.1 F (36.7 C), resp. rate 17, height 5\' 4"  (1.626 m), weight 65.3 kg, SpO2 100%.  PHYSICAL EXAMINATION:   GENERAL:  62 y.o.-year-old patient with no acute distress.  LUNGS: Normal breath sounds bilaterally, no wheezing CARDIOVASCULAR: S1, S2 normal. No murmur   ABDOMEN: Soft, nontender, nondistended. Bowel sounds present.  EXTREMITIES: No  edema b/l.    Right hand POA  NEUROLOGIC: nonfocal  patient is alert and awake    LABORATORY PANEL:  CBC Recent Labs  Lab 03/31/23 0141  WBC 8.2  HGB 10.1*  HCT 31.5*  PLT 251    Chemistries  Recent Labs  Lab 03/30/23 0032 03/31/23 0141  NA 134* 138  K 3.7 4.0  CL 103 110  CO2 25 21*  GLUCOSE 128* 190*  BUN 32* 18  CREATININE 1.29* 1.13*  CALCIUM 9.2 7.8*  AST 59*  --   ALT 99*  --   ALKPHOS 103  --   BILITOT 0.4  --    Cardiac Enzymes No results for input(s): "TROPONINI" in the last 168 hours. RADIOLOGY:  CT HAND RIGHT W CONTRAST  Result Date: 03/30/2023 CLINICAL DATA:  Right hand cellulitis, evaluate for abscess. EXAM: CT OF THE UPPER RIGHT EXTREMITY WITH CONTRAST TECHNIQUE: Multidetector CT imaging of the upper right extremity was performed according to the standard protocol following intravenous contrast administration. RADIATION DOSE REDUCTION: This exam was performed according to the departmental dose-optimization program which includes automated exposure control, adjustment of the mA and/or kV according to patient size and/or use of iterative reconstruction technique. CONTRAST:  OMNIPAQUE IOHEXOL 300 MG/ML  SOLN  COMPARISON:  None Available. FINDINGS: Bones/Joint/Cartilage Osseous structures are within normal limits. No evidence of fracture or osteonecrosis. Scattered cystic changes in the carpal bones, otherwise no significant arthropathy. Ligaments Suboptimally assessed by CT. Muscles and Tendons Thenar and hypothenar muscles are normal in bulk. No intramuscular hematoma or fluid collection. There is a peripherally enhancing hypodense area in the between the second and third metacarpals measuring a proximally 0.9 x 0.3 x 4.0 cm (series 5, image 53; series 8, image 19) suspicious for developing abscess. Soft tissues Marked soft tissue edema about the second digit and dorsum of the hand suggesting cellulitis. IMPRESSION: 1. Marked soft tissue edema about the second digit and dorsum of the hand suggesting cellulitis. 2. There is a peripherally enhancing hypodense area between the second and third metacarpals soft tissues measuring 0.9 x 0.3 x 4.0 cm, suspicious for developing abscess. Clinical correlation and continued follow-up examination is suggested. 3. No evidence of fracture or osteonecrosis. Electronically Signed   By: Wanda Smith D.O.   On: 03/30/2023 17:47    Assessment and Plan   Wanda Smith is a 62 y.o. female with medical history significant of HTN, HLD, DM, COPD, hypothyroidism, depression with anxiety, CKD-3A, anemia, who presents with right hand pain, redness and swelling. Pt had a scratch on the right had about a week ago from her dog.  CT right hand--1. Marked soft tissue edema about the second digit and dorsum of the hand suggesting cellulitis. 2. There is a peripherally  enhancing hypodense area between the second and third metacarpals soft tissues measuring 0.9 x 0.3 x 4.0 cm, suspicious for developing abscess. Clinical correlation and continued follow-up examination is suggested. 3. No evidence of fracture or osteonecrosis   Severe sepsis due to cellulitis/abscess of right hand: --  Ct showed possible small abscess formation.  Patient has severe sepsis with WBC 16.1, fever of 101.3, RR up to 22.  Lactic acid 1.2 --> 1.6 --> 2.4.  Procalcitonin 0.16.  Patient developed hypotension with blood pressure 88/61, which has responded to IV fluid resuscitation, and treatment with albumin, midodrine and Solu-Cortef.  -- Consulted Dr. Joice Lofts of ortho. Pt is s/p  Irrigation and debridement of right hand abscess with I&D of right index flexor sheath.  --BP much improved -- Empiric antimicrobial treatment with vancomycin and cefazolin (peri-op) - PRN Zofran for nausea, morphine and oxycodone for pain - Blood cultures x 2 --negative - ESR 39  and CRP 3.2 -  trended lactic acid levels down --d/c IVF --WC (deep) rare GPC    Abnormal LFTs: mild.  Likely due to sepsis, Tylenol level less than 10. -Careful use of Tylenol   Type II diabetes mellitus with renal manifestations [facility]): Recent A1c 6.3, well-controlled.  Patient is taking metformin and fark scar -Sliding scale insulin   Hypothyroidism -Synthroid   Hypertension -Hold lisinopril due to severe sepsis   HLD (hyperlipidemia) -Crestor   COPD (chronic obstructive pulmonary disease) (HCC): Stable -As needed bronchodilators   Chronic kidney disease, stage 3a (HCC): Stable.   Depression with anxiety -Continue home medications       DVT ppx: SCD  Code Status: Full code   Family Communication: none today Procedures: Irrigation and debridement of right hand abscess with I&D of right index flexor sheath.  Level of care: Med-Surg Status is: Inpatient Remains inpatient appropriate because: sepsis due to right hand abscess    TOTAL TIME TAKING CARE OF THIS PATIENT: 35 minutes.  >50% time spent on counselling and coordination of care  Note: This dictation was prepared with Dragon dictation along with smaller phrase technology. Any transcriptional errors that result from this process are unintentional.  Enedina Finner M.D     [facility]   CC: Primary care physician; Patrice Paradise, MD

## 2023-03-31 NOTE — Plan of Care (Signed)

## 2023-04-01 ENCOUNTER — Encounter: Payer: Self-pay | Admitting: Internal Medicine

## 2023-04-01 ENCOUNTER — Other Ambulatory Visit: Payer: Self-pay

## 2023-04-01 ENCOUNTER — Inpatient Hospital Stay: Payer: No Typology Code available for payment source | Admitting: Anesthesiology

## 2023-04-01 ENCOUNTER — Encounter
Admission: EM | Disposition: A | Discharge status: Home / Self Care | Payer: Self-pay | Source: Home / Self Care | Attending: Internal Medicine

## 2023-04-01 ENCOUNTER — Inpatient Hospital Stay: Payer: No Typology Code available for payment source

## 2023-04-01 DIAGNOSIS — Z794 Long term (current) use of insulin: Secondary | ICD-10-CM

## 2023-04-01 DIAGNOSIS — E1129 Type 2 diabetes mellitus with other diabetic kidney complication: Secondary | ICD-10-CM | POA: Diagnosis not present

## 2023-04-01 DIAGNOSIS — N1831 Chronic kidney disease, stage 3a: Secondary | ICD-10-CM | POA: Diagnosis not present

## 2023-04-01 DIAGNOSIS — I129 Hypertensive chronic kidney disease with stage 1 through stage 4 chronic kidney disease, or unspecified chronic kidney disease: Secondary | ICD-10-CM

## 2023-04-01 DIAGNOSIS — E1122 Type 2 diabetes mellitus with diabetic chronic kidney disease: Secondary | ICD-10-CM | POA: Diagnosis not present

## 2023-04-01 DIAGNOSIS — L03113 Cellulitis of right upper limb: Secondary | ICD-10-CM | POA: Diagnosis not present

## 2023-04-01 DIAGNOSIS — L02511 Cutaneous abscess of right hand: Secondary | ICD-10-CM | POA: Diagnosis not present

## 2023-04-01 DIAGNOSIS — A419 Sepsis, unspecified organism: Secondary | ICD-10-CM | POA: Diagnosis not present

## 2023-04-01 HISTORY — PX: I & D EXTREMITY: SHX5045

## 2023-04-01 LAB — CBC
HCT: 32 % — ABNORMAL LOW (ref 36.0–46.0)
Hemoglobin: 10.2 g/dL — ABNORMAL LOW (ref 12.0–15.0)
MCH: 30.2 pg (ref 26.0–34.0)
MCHC: 31.9 g/dL (ref 30.0–36.0)
MCV: 94.7 fL (ref 80.0–100.0)
Platelets: 261 10*3/uL (ref 150–400)
RBC: 3.38 MIL/uL — ABNORMAL LOW (ref 3.87–5.11)
RDW: 14.4 % (ref 11.5–15.5)
WBC: 10.3 10*3/uL (ref 4.0–10.5)
nRBC: 0 % (ref 0.0–0.2)

## 2023-04-01 LAB — GLUCOSE, CAPILLARY
Glucose-Capillary: 111 mg/dL — ABNORMAL HIGH (ref 70–99)
Glucose-Capillary: 113 mg/dL — ABNORMAL HIGH (ref 70–99)
Glucose-Capillary: 101 mg/dL — ABNORMAL HIGH (ref 70–99)
Glucose-Capillary: 122 mg/dL — ABNORMAL HIGH (ref 70–99)
Glucose-Capillary: 144 mg/dL — ABNORMAL HIGH (ref 70–99)

## 2023-04-01 LAB — CREATININE, SERUM
Creatinine, Ser: 1.19 mg/dL — ABNORMAL HIGH (ref 0.44–1.00)
GFR, Estimated: 52 mL/min — ABNORMAL LOW (ref >60)

## 2023-04-01 SURGERY — IRRIGATION AND DEBRIDEMENT EXTREMITY
Anesthesia: General | Site: Hand | Laterality: Right

## 2023-04-01 MED ORDER — LIDOCAINE HCL (CARDIAC) PF 100 MG/5ML IV SOSY
PREFILLED_SYRINGE | INTRAVENOUS | Status: DC | PRN
  Administered 2023-04-01: 100 mg via INTRAVENOUS

## 2023-04-01 MED ORDER — PHENYLEPHRINE 80 MCG/ML (10ML) SYRINGE FOR IV PUSH (FOR BLOOD PRESSURE SUPPORT)
PREFILLED_SYRINGE | INTRAVENOUS | Status: DC | PRN
  Administered 2023-04-01: 80 ug via INTRAVENOUS
  Administered 2023-04-01: 160 ug via INTRAVENOUS

## 2023-04-01 MED ORDER — FENTANYL CITRATE (PF) 100 MCG/2ML IJ SOLN
INTRAMUSCULAR | Status: DC | PRN
  Administered 2023-04-01: 50 ug via INTRAVENOUS
  Administered 2023-04-01 (×2): 25 ug via INTRAVENOUS
  Administered 2023-04-01: 50 ug via INTRAVENOUS

## 2023-04-01 MED ORDER — SODIUM CHLORIDE 0.9 % IV SOLN
3.0000 g | Freq: Four times a day (QID) | INTRAVENOUS | Status: DC
  Administered 2023-04-01 – 2023-04-04 (×13): 3 g via INTRAVENOUS
  Filled 2023-04-01 (×14): qty 8

## 2023-04-01 MED ORDER — IOHEXOL 300 MG/ML  SOLN
100.0000 mL | Freq: Once | INTRAMUSCULAR | Status: AC | PRN
  Administered 2023-04-01: 100 mL via INTRAVENOUS

## 2023-04-01 MED ORDER — ONDANSETRON HCL 4 MG/2ML IJ SOLN: 4.0000 mg | Freq: Once | INTRAMUSCULAR | Status: DC | PRN

## 2023-04-01 MED ORDER — MAGNESIUM HYDROXIDE 400 MG/5ML PO SUSP: 30.0000 mL | Freq: Every day | ORAL | Status: DC | PRN

## 2023-04-01 MED ORDER — HYDROMORPHONE HCL 1 MG/ML IJ SOLN
0.5000 mg | INTRAMUSCULAR | Status: DC | PRN
  Administered 2023-04-01 – 2023-04-02 (×3): 0.5 mg via INTRAVENOUS
  Filled 2023-04-01 (×3): qty 0.5

## 2023-04-01 MED ORDER — BUPIVACAINE HCL (PF) 0.5 % IJ SOLN
INTRAMUSCULAR | Status: AC
  Filled 2023-04-01: qty 30

## 2023-04-01 MED ORDER — ENOXAPARIN SODIUM 40 MG/0.4ML IJ SOSY
40.0000 mg | PREFILLED_SYRINGE | INTRAMUSCULAR | Status: DC
  Administered 2023-04-02 – 2023-04-03 (×2): 40 mg via SUBCUTANEOUS
  Filled 2023-04-01 (×2): qty 0.4

## 2023-04-01 MED ORDER — METOCLOPRAMIDE HCL 5 MG/ML IJ SOLN: 5.0000 mg | Freq: Three times a day (TID) | INTRAMUSCULAR | Status: DC | PRN

## 2023-04-01 MED ORDER — LINEZOLID 600 MG/300ML IV SOLN
600.0000 mg | Freq: Two times a day (BID) | INTRAVENOUS | Status: DC
  Administered 2023-04-01 – 2023-04-02 (×3): 600 mg via INTRAVENOUS
  Filled 2023-04-01 (×4): qty 300

## 2023-04-01 MED ORDER — FENTANYL CITRATE (PF) 100 MCG/2ML IJ SOLN: 25.0000 ug | INTRAMUSCULAR | Status: DC | PRN

## 2023-04-01 MED ORDER — ONDANSETRON HCL 4 MG PO TABS: 4.0000 mg | ORAL_TABLET | Freq: Four times a day (QID) | ORAL | Status: DC | PRN

## 2023-04-01 MED ORDER — DIPHENHYDRAMINE HCL 12.5 MG/5ML PO ELIX: 12.5000 mg | ORAL_SOLUTION | ORAL | Status: DC | PRN

## 2023-04-01 MED ORDER — OXYCODONE HCL 5 MG PO TABS
5.0000 mg | ORAL_TABLET | Freq: Once | ORAL | Status: AC | PRN
  Administered 2023-04-01: 5 mg via ORAL

## 2023-04-01 MED ORDER — OXYCODONE HCL 5 MG PO TABS
ORAL_TABLET | ORAL | Status: AC
  Filled 2023-04-01: qty 1

## 2023-04-01 MED ORDER — CEFAZOLIN SODIUM-DEXTROSE 2-4 GM/100ML-% IV SOLN
INTRAVENOUS | Status: AC
  Filled 2023-04-01: qty 100

## 2023-04-01 MED ORDER — OXYCODONE HCL 5 MG/5ML PO SOLN: 5.0000 mg | Freq: Once | ORAL | Status: AC | PRN

## 2023-04-01 MED ORDER — LIDOCAINE HCL (PF) 2 % IJ SOLN
INTRAMUSCULAR | Status: AC
  Filled 2023-04-01: qty 5

## 2023-04-01 MED ORDER — BISACODYL 10 MG RE SUPP: 10.0000 mg | Freq: Every day | RECTAL | Status: DC | PRN

## 2023-04-01 MED ORDER — FLEET ENEMA 7-19 GM/118ML RE ENEM: 1.0000 | ENEMA | Freq: Once | RECTAL | Status: DC | PRN

## 2023-04-01 MED ORDER — PROPOFOL 10 MG/ML IV BOLUS
INTRAVENOUS | Status: AC
  Filled 2023-04-01: qty 20

## 2023-04-01 MED ORDER — FENTANYL CITRATE (PF) 100 MCG/2ML IJ SOLN
INTRAMUSCULAR | Status: AC
  Filled 2023-04-01: qty 2

## 2023-04-01 MED ORDER — PROPOFOL 10 MG/ML IV BOLUS
INTRAVENOUS | Status: DC | PRN
Start: 2023-04-01 — End: 2023-04-01
  Administered 2023-04-01: 200 mg via INTRAVENOUS

## 2023-04-01 MED ORDER — MIDAZOLAM HCL 2 MG/2ML IJ SOLN
INTRAMUSCULAR | Status: DC | PRN
  Administered 2023-04-01: 2 mg via INTRAVENOUS

## 2023-04-01 MED ORDER — METOCLOPRAMIDE HCL 5 MG PO TABS: 5.0000 mg | ORAL_TABLET | Freq: Three times a day (TID) | ORAL | Status: DC | PRN

## 2023-04-01 MED ORDER — ONDANSETRON HCL 4 MG/2ML IJ SOLN
INTRAMUSCULAR | Status: DC | PRN
  Administered 2023-04-01: 4 mg via INTRAVENOUS

## 2023-04-01 MED ORDER — DOCUSATE SODIUM 100 MG PO CAPS
100.0000 mg | ORAL_CAPSULE | Freq: Two times a day (BID) | ORAL | Status: DC
  Administered 2023-04-02 – 2023-04-04 (×5): 100 mg via ORAL
  Filled 2023-04-01 (×6): qty 1

## 2023-04-01 MED ORDER — DEXMEDETOMIDINE HCL IN NACL 80 MCG/20ML IV SOLN
INTRAVENOUS | Status: AC
  Filled 2023-04-01: qty 20

## 2023-04-01 MED ORDER — MIDAZOLAM HCL 2 MG/2ML IJ SOLN
INTRAMUSCULAR | Status: AC
  Filled 2023-04-01: qty 2

## 2023-04-01 MED ORDER — CEFAZOLIN SODIUM-DEXTROSE 2-4 GM/100ML-% IV SOLN
2.0000 g | Freq: Four times a day (QID) | INTRAVENOUS | Status: AC
  Administered 2023-04-01 – 2023-04-02 (×3): 2 g via INTRAVENOUS
  Filled 2023-04-01 (×3): qty 100

## 2023-04-01 MED ORDER — ONDANSETRON HCL 4 MG/2ML IJ SOLN: 4.0000 mg | Freq: Four times a day (QID) | INTRAMUSCULAR | Status: DC | PRN

## 2023-04-01 MED ORDER — 0.9 % SODIUM CHLORIDE (POUR BTL) OPTIME
TOPICAL | Status: DC | PRN
  Administered 2023-04-01: 1000 mL
  Administered 2023-04-01: 500 mL

## 2023-04-01 MED ORDER — DEXMEDETOMIDINE HCL IN NACL 80 MCG/20ML IV SOLN
INTRAVENOUS | Status: DC | PRN
  Administered 2023-04-01 (×2): 8 ug via INTRAVENOUS

## 2023-04-01 SURGICAL SUPPLY — 40 items
APL PRP STRL LF DISP 70% ISPRP (MISCELLANEOUS) ×1
BLADE SURG SZ10 CARB STEEL (BLADE) ×1 IMPLANT
BNDG CMPR 5X4 CHSV STRCH STRL (GAUZE/BANDAGES/DRESSINGS) ×1
BNDG COHESIVE 4X5 TAN STRL LF (GAUZE/BANDAGES/DRESSINGS) ×1 IMPLANT
BNDG ESMARCH 4 X 12 STRL LF (GAUZE/BANDAGES/DRESSINGS) ×1
BNDG ESMARCH 4X12 STRL LF (GAUZE/BANDAGES/DRESSINGS) ×1 IMPLANT
CHLORAPREP W/TINT 26 (MISCELLANEOUS) ×1 IMPLANT
CUFF TOURN SGL QUICK 18X4 (TOURNIQUET CUFF) IMPLANT
ELASTIC BANDAGE IMPLANT
ELECT REM PT RETURN 9FT ADLT (ELECTROSURGICAL) ×1
ELECTRODE REM PT RTRN 9FT ADLT (ELECTROSURGICAL) ×1 IMPLANT
GAUZE XEROFORM 1X8 LF (GAUZE/BANDAGES/DRESSINGS) IMPLANT
GLOVE INDICATOR 8.0 STRL GRN (GLOVE) ×1 IMPLANT
GLOVE SURG ORTHO 8.5 STRL (GLOVE) ×1 IMPLANT
GOWN STRL REUS W/ TWL LRG LVL3 (GOWN DISPOSABLE) ×1 IMPLANT
GOWN STRL REUS W/ TWL XL LVL3 (GOWN DISPOSABLE) ×1 IMPLANT
GOWN STRL REUS W/TWL LRG LVL3 (GOWN DISPOSABLE) ×1
GOWN STRL REUS W/TWL XL LVL3 (GOWN DISPOSABLE) ×1
KIT PREVENA INCISION MGT 13 (CANNISTER) IMPLANT
KIT TURNOVER KIT A (KITS) ×1 IMPLANT
LABEL OR SOLS (LABEL) ×1 IMPLANT
MANIFOLD NEPTUNE II (INSTRUMENTS) ×1 IMPLANT
NDL SAFETY ECLIP 18X1.5 (MISCELLANEOUS) ×1 IMPLANT
NS IRRIG 1000ML POUR BTL (IV SOLUTION) ×1 IMPLANT
PACK EXTREMITY ARMC (MISCELLANEOUS) ×1 IMPLANT
PAD CAST 4YDX4 CTTN HI CHSV (CAST SUPPLIES) ×1 IMPLANT
PADDING CAST COTTON 4X4 STRL (CAST SUPPLIES) ×1
SPLINT CAST 1 STEP 3X12 (MISCELLANEOUS) ×1 IMPLANT
SPONGE T-LAP 18X18 ~~LOC~~+RFID (SPONGE) ×1 IMPLANT
STAPLER SKIN PROX 35W (STAPLE) ×1 IMPLANT
STOCKINETTE IMPERVIOUS 9X36 MD (GAUZE/BANDAGES/DRESSINGS) ×1 IMPLANT
SUT ETHILON 4-0 (SUTURE) ×1
SUT ETHILON 4-0 FS2 18XMFL BLK (SUTURE) ×1
SUT PROLENE 4 0 PS 2 18 (SUTURE) IMPLANT
SUT VIC AB 4-0 SH 27 (SUTURE)
SUT VIC AB 4-0 SH 27XANBCTRL (SUTURE) ×1 IMPLANT
SUT VICRYL+ 3-0 36IN CT-1 (SUTURE) ×1 IMPLANT
SUTURE ETHLN 4-0 FS2 18XMF BLK (SUTURE) ×1 IMPLANT
SYR 10ML LL (SYRINGE) ×1 IMPLANT
TRAP FLUID SMOKE EVACUATOR (MISCELLANEOUS) ×1 IMPLANT

## 2023-04-01 NOTE — Transfer of Care (Signed)
Immediate Anesthesia Transfer of Care Note  Patient: Wanda Smith  Procedure(s) Performed: IRRIGATION AND DEBRIDEMENT EXTREMITY (Right)  Patient Location: PACU  Anesthesia Type:General  Level of Consciousness: drowsy  Airway & Oxygen Therapy: Patient Spontanous Breathing and Patient connected to face mask oxygen  Post-op Assessment: Report given to RN and Post -op Vital signs reviewed and stable  Post vital signs: Reviewed and stable  Last Vitals:  Vitals Value Taken Time  BP 96/58 04/01/23 1800  Temp    Pulse 65 04/01/23 1804  Resp 12 04/01/23 1804  SpO2 100 % 04/01/23 1804  Vitals shown include unfiled device data.  Last Pain:  Vitals:   04/01/23 1601  TempSrc: Temporal  PainSc: 0-No pain      Patients Stated Pain Goal: 2 (03/31/23 2125)  Complications: No notable events documented.

## 2023-04-01 NOTE — Plan of Care (Signed)

## 2023-04-01 NOTE — Anesthesia Procedure Notes (Signed)
Procedure Name: LMA Insertion Date/Time: 04/01/2023 4:37 PM  Performed by: Hezzie Bump, CRNAPre-anesthesia Checklist: Patient identified, Patient being monitored, Timeout performed, Emergency Drugs available and Suction available Patient Re-evaluated:Patient Re-evaluated prior to induction Oxygen Delivery Method: Circle system utilized Preoxygenation: Pre-oxygenation with 100% oxygen Induction Type: IV induction Ventilation: Mask ventilation without difficulty LMA: LMA inserted LMA Size: 3.0 Tube type: Oral Number of attempts: 1 Placement Confirmation: positive ETCO2 and breath sounds checked- equal and bilateral Tube secured with: Tape Dental Injury: Teeth and Oropharynx as per pre-operative assessment

## 2023-04-01 NOTE — Anesthesia Preprocedure Evaluation (Signed)
Anesthesia Evaluation  Patient identified by MRN, date of birth, ID band Patient awake    Reviewed: Allergy & Precautions, NPO status , Patient's Chart, lab work & pertinent test results  History of Anesthesia Complications Negative for: history of anesthetic complications  Airway Mallampati: III  TM Distance: >3 FB Neck ROM: Full    Dental  (+) Dental Advidsory Given, Teeth Intact, Partial Upper Upper partial; lower molars missing:   Pulmonary neg shortness of breath, asthma , neg sleep apnea, COPD,  COPD inhaler, neg recent URI, Patient abstained from smoking.Not current smoker, former smoker   Pulmonary exam normal breath sounds clear to auscultation       Cardiovascular Exercise Tolerance: Good METShypertension, (-) angina (-) CAD, (-) Past MI and (-) Cardiac Stents Normal cardiovascular exam(-) dysrhythmias (-) Valvular Problems/Murmurs Rhythm:Regular Rate:Normal - Systolic murmurs ECG 10/15/21: normal   Neuro/Psych  PSYCHIATRIC DISORDERS Anxiety Depression    negative neurological ROS     GI/Hepatic ,GERD  Controlled,,(+)     (-) substance abuse    Endo/Other  diabetes, Type 2Hypothyroidism    Renal/GU CRFRenal disease (stage III CKD)     Musculoskeletal  (+) Arthritis ,    Abdominal   Peds  Hematology negative hematology ROS (+)   Anesthesia Other Findings Past Medical History: No date: Anemia No date: Anxiety No date: Arthritis     Comment:  knee (R) No date: Asthma No date: Chronic kidney disease     Comment:  Stage 3 sees Lateef No date: COPD (chronic obstructive pulmonary disease) (HCC) No date: Depression No date: Diabetes mellitus without complication (HCC) No date: Dyspnea No date: GERD (gastroesophageal reflux disease)     Comment:  occ No date: Hypertension No date: Hypothyroidism No date: Increased serum lipids No date: Wears contact lenses   Reproductive/Obstetrics                              Anesthesia Physical Anesthesia Plan  ASA: 3  Anesthesia Plan: General   Post-op Pain Management: Ofirmev IV (intra-op)*   Induction: Intravenous  PONV Risk Score and Plan: 2 and Ondansetron, Dexamethasone and Midazolam  Airway Management Planned: LMA  Additional Equipment: None  Intra-op Plan:   Post-operative Plan: Extubation in OR  Informed Consent: I have reviewed the patients History and Physical, chart, labs and discussed the procedure including the risks, benefits and alternatives for the proposed anesthesia with the patient or authorized representative who has indicated his/her understanding and acceptance.     Dental advisory given  Plan Discussed with: CRNA and Surgeon  Anesthesia Plan Comments: (Discussed risks of anesthesia with patient, including PONV, sore throat, lip/dental/eye damage. Rare risks discussed as well, such as cardiorespiratory and neurological sequelae, and allergic reactions. Discussed the role of CRNA in patient's perioperative care. Patient understands. NPO appropriate)        Anesthesia Quick Evaluation

## 2023-04-01 NOTE — Progress Notes (Signed)
Subjective: 2 Days Post-Op Procedure(s) (LRB): INCISION AND DRAINAGE ABSCESS RIGHT HAND (Right) Patient reports pain as moderate but states it does feel better than prior to surgery. Patient is well, and has had no acute complaints or problems Plan is to go Home after hospital stay. Negative for chest pain and shortness of breath Fever: No recent fever, did have temp of 101.3 yesterday prior to surgery. Gastrointestinal:Negative for nausea and vomiting  Objective: Vital signs in last 24 hours: Temp:  [98.1 F (36.7 C)-98.7 F (37.1 C)] 98.7 F (37.1 C) (07/25 2333) Pulse Rate:  [54-82] 82 (07/26 0732) Resp:  [15-17] 17 (07/26 0732) BP: (101-129)/(64-81) 129/79 (07/26 0732) SpO2:  [96 %-100 %] 97 % (07/26 0732)  Intake/Output from previous day:  Intake/Output Summary (Last 24 hours) at 04/01/2023 0809 Last data filed at 04/01/2023 0604 Gross per 24 hour  Intake 1074.69 ml  Output --  Net 1074.69 ml    Intake/Output this shift: No intake/output data recorded.  Labs: Recent Labs    03/30/23 0032 03/31/23 0141  HGB 12.5 10.1*   Recent Labs    03/30/23 0032 03/31/23 0141  WBC 16.1* 8.2  RBC 4.13 3.38*  HCT 39.0 31.5*  PLT 390 251   Recent Labs    03/30/23 0032 03/31/23 0141  NA 134* 138  K 3.7 4.0  CL 103 110  CO2 25 21*  BUN 32* 18  CREATININE 1.29* 1.13*  GLUCOSE 128* 190*  CALCIUM 9.2 7.8*   No results for input(s): "LABPT", "INR" in the last 72 hours.   EXAM General - Patient is Alert, Appropriate, and Oriented Extremity - ABD soft She is keeping right hand elevated. Splint intact.  Intact to sensation to exposed fingers. She is able to gentle flex and extend fingers with mild pain. Negative homans to lower extremities.  Past Medical History:  Diagnosis Date   Anemia    Anxiety    Arthritis    knee (R)   Asthma    Chronic kidney disease    Stage 3 sees Lateef   COPD (chronic obstructive pulmonary disease) (HCC)    Depression    Diabetes  mellitus without complication (HCC)    Dyspnea    GERD (gastroesophageal reflux disease)    occ   Hypertension    Hypothyroidism    Increased serum lipids    Wears contact lenses     Assessment/Plan: 2 Days Post-Op Procedure(s) (LRB): INCISION AND DRAINAGE ABSCESS RIGHT HAND (Right) Principal Problem:   Cellulitis of right hand Active Problems:   Hypothyroidism   Hypertension   COPD (chronic obstructive pulmonary disease) (HCC)   Depression with anxiety   HLD (hyperlipidemia)   Type II diabetes mellitus with renal manifestations (HCC)   Chronic kidney disease, stage 3a (HCC)   Abnormal LFTs   Severe sepsis (HCC)   Abscess of right hand  Estimated body mass index is 24.72 kg/m as calculated from the following:   Height as of this encounter: 5\' 4"  (1.626 m).   Weight as of this encounter: 65.3 kg. Advance diet Up with therapy D/C IV fluids when tolerating po intake.  Labs reviewed, WBC 8.2.  No recent fevers. Currently on Vancomycin and Ancef.  Continue on Vancomycin. Cultures from surgery are pending.  Gram stain with rare gram positive cocci. Will plan on removing splint tomorrow and advancing/removing drain. Continue IV Abx, plan for possible d/c home on Saturday.  DVT Prophylaxis - Lovenox and TED hose Minimal weightbearing to the right arm.  Valeria Batman, PA-C [facility] Orthopaedic Surgery 04/01/2023, 8:09 AM    Addendum: The patient continues to still have moderate-severe swelling in her hand extending into her wrist, as well as tenderness to palpation diffusely over the wrist and hand, all of which are concerning for consent continued infection.  Therefore, a repeat CT scan of the right hand has been ordered which confirms the presence of persistent fluid collection between the second and third metacarpals.  Therefore, the patient will be taken back to the OR for repeat irrigation debridement of the right hand abscess.  This time in addition to reopening  the palmar incision for a repeat irrigation and debridement, she also will have a dorsal incision made to be sure that this area is fully irrigated and debrided.  Maryagnes Amos, MD [facility] Orthopaedic Surgery 04/01/2023, 1358 PM

## 2023-04-01 NOTE — Consult Note (Addendum)
NAME: [name]  DOB: [birth year]  MRN: [id number]  Date/Time: 04/01/2023 12:48 PM  REQUESTING PROVIDER: Dr. Allena Katz Subjective:  REASON FOR CONSULT: Hand infection ? Wanda Smith is a 62 y.o. female with a history of diabetes mellitus, CKD, hypertension, COPD, anemia presented to the ED on 03/30/2023 with right hand severe pain, redness and swelling of 3 days duration. Couple of weeks ago the date is not definite she sustained a scratch on her right index finger base.  Initially this was thought to be a dog scratch but then later it was found that she was scratched by  the wire fence.  On 03/27/2023 she went to the urgent care because there was some pain and had x-ray of the right index finger which showed soft tissue swelling.  Vitals on that day was BP of 108/72, temperature 98.4, pulse 57 and respiratory to 16 and sats 98%.       Initially they thought it was gout the uric acid level was 5.7 they sent her on prednisone short tapering course.  She got worse and the hand was more swollen and painful and red and she came to the ED on 03/30/2023.  She did not have any fever at home .  She was unable to flex her fingers. In the ED vitals BP of 150/93, temperature 98.8, pulse 77, respiratory 20 and 100% oxygen. WBC was 6.1, Hb 12.5, platelet 390 and creatinine was 1.29. She later had a temperature of 101.3.  Blood culture was sent and she had a CT of the hand which revealed marked soft tissue edema about the second digit on the dorsum of the hand suggestive of cellulitis Stop there was a peripherally enhancing hypodense area between the second and third metacarpal soft tissue measuring 0.9 into 0.3 X 4 cm suspicious for developing abscess.  She was started on IV vancomycin and was seen by orthopedic surgeon Dr. Joice Lofts  who took her for surgery on 03/30/2023 and she underwent irrigation and debridement of the right hand abscess with I&D of the right index flexor sheath.  2 sets of cultures  were sent from the OR of soft tissue and both had Staph aureus.  As the swelling of the hand was increasing today she had another CAT scan and that showed slight interval enlargement of the peripherally enhancing abscess between second and third metacarpals, now measuring 4 cm X 0.6 cm X 1.7 cm with marked cellulitis of the dorsum of the hand wrist and distal forearm.  She was taken back to the OR and underwent further irrigation and debridement of the right hand abscess from the palmar and dorsal approaches and Penrose drain was placed.  Cultures were again sent. I am asked to see the patient for the management of the hand abscess I gone to seen patient couple of times before but she was in the OR and then was in the postop area but later moved to the floor Family around her bedside and her daughter, son and husband was able to give  history Right arm has surgical dressing  Past Medical History:  Diagnosis Date   Anemia    Anxiety    Arthritis    knee (R)   Asthma    Chronic kidney disease    Stage 3 sees Lateef   COPD (chronic obstructive pulmonary disease) (HCC)    Depression    Diabetes mellitus without complication (HCC)    Dyspnea    GERD (gastroesophageal reflux disease)  occ   Hypertension    Hypothyroidism    Increased serum lipids    Wears contact lenses     Past Surgical History:  Procedure Laterality Date   ABDOMINAL HYSTERECTOMY     CARPAL TUNNEL RELEASE Right 07/26/2018   Procedure: CARPAL TUNNEL RELEASE ENDOSCOPIC;  Surgeon: Christena Flake, MD;  Location: [facility];  Service: Orthopedics;  Laterality: Right;  diabetic - oral meds   CHOLECYSTECTOMY     COLONOSCOPY WITH ESOPHAGOGASTRODUODENOSCOPY (EGD)     COLONOSCOPY WITH PROPOFOL N/A 01/13/2018   Procedure: COLONOSCOPY WITH PROPOFOL;  Surgeon: Scot Jun, MD;  [facility]: [facility];  Service: Endoscopy;  Laterality: N/A;   HERNIA REPAIR     INCISION AND DRAINAGE ABSCESS Right 03/30/2023    Procedure: INCISION AND DRAINAGE ABSCESS RIGHT HAND;  Surgeon: Christena Flake, MD;  Location: [facility];  Service: Orthopedics;  Laterality: Right;   left rotator cuff repair Left    SHOULDER ARTHROSCOPY WITH SUBACROMIAL DECOMPRESSION, ROTATOR CUFF REPAIR AND BICEP TENDON REPAIR Right 10/21/2021   Procedure: SHOULDER ARTHROSCOPY WITH DEBRIDEMENT, DECOMPRESSION, BICEPS TENODESIS.;  Surgeon: Christena Flake, MD;  Location: [facility];  Service: Orthopedics;  Laterality: Right;   TUBAL LIGATION      Social History   Socioeconomic History   Marital status: Married    Spouse name: Not on file   Number of children: Not on file   Years of education: Not on file   Highest education level: Not on file  Occupational History   Not on file  Tobacco Use   Smoking status: Former    Current packs/day: 0.00    Average packs/day: 0.8 packs/day for 20.0 years (15.0 ttl pk-yrs)    Types: Cigarettes    Start date: 09/07/1987    Quit date: 09/07/2007    Years since quitting: 15.5   Smokeless tobacco: Never  Vaping Use   Vaping status: Never Used  Substance and Sexual Activity   Alcohol use: Yes    Alcohol/week: 2.0 standard drinks of alcohol    Types: 2 Standard drinks or equivalent per week    Comment: occasionally (4x/yr)   Drug use: Never   Sexual activity: Not on file  Other Topics Concern   Not on file  Social History Narrative   Not on file   Social Determinants of Health   Financial Resource Strain: Not on file  Food Insecurity: No Food Insecurity (03/30/2023)   Hunger Vital Sign    Worried About Running Out of Food in the Last Year: Never true    Ran Out of Food in the Last Year: Never true  Transportation Needs: No Transportation Needs (03/30/2023)   PRAPARE - Administrator, Civil Service (Medical): No    Lack of Transportation (Non-Medical): No  Physical Activity: Not on file  Stress: Not on file  Social Connections: Not on file  Intimate Partner Violence: Not At Risk  (03/30/2023)   Humiliation, Afraid, Rape, and Kick questionnaire    Fear of Current or Ex-Partner: No    Emotionally Abused: No    Physically Abused: No    Sexually Abused: No    Family History  Problem Relation Age of Onset   Breast cancer Mother 27   Breast cancer Sister 43   Diabetes Sister    Diabetes Father    Diabetes Brother    Allergies  Allergen Reactions   Pravastatin     Abnormal liver function tests   Tramadol Nausea Only  I? Current Facility-Administered Medications  Medication Dose Route Frequency Provider Last Rate Last Admin   0.9 %  sodium chloride infusion   Intravenous Continuous Poggi, Excell Seltzer, MD 75 mL/hr at 04/01/23 0604 Infusion Verify at 04/01/23 0604   acetaminophen (TYLENOL) tablet 325-650 mg  325-650 mg Oral Q6H PRN Christena Flake, MD   650 mg at 04/01/23 0747   albumin human 25 % solution 25 g  25 g Intravenous Once Poggi, Excell Seltzer, MD       albuterol (PROVENTIL) (2.5 MG/3ML) 0.083% nebulizer solution 2.5 mg  2.5 mg Inhalation Q4H PRN Poggi, Excell Seltzer, MD       Ampicillin-Sulbactam (UNASYN) 3 g in sodium chloride 0.9 % 100 mL IVPB  3 g Intravenous QID Enedina Finner, MD       bisacodyl (DULCOLAX) suppository 10 mg  10 mg Rectal Daily PRN Poggi, Excell Seltzer, MD       ceFAZolin (ANCEF) IVPB 2g/100 mL premix  2 g Intravenous 30 min Pre-Op Poggi, Excell Seltzer, MD       citalopram (CELEXA) tablet 40 mg  40 mg Oral QPM Poggi, Excell Seltzer, MD   40 mg at 03/31/23 1703   dextromethorphan-guaiFENesin (MUCINEX DM) 30-600 MG per 12 hr tablet 1 tablet  1 tablet Oral BID PRN Poggi, Excell Seltzer, MD       diphenhydrAMINE (BENADRYL) 12.5 MG/5ML elixir 12.5-25 mg  12.5-25 mg Oral Q4H PRN Poggi, Excell Seltzer, MD       docusate sodium (COLACE) capsule 100 mg  100 mg Oral BID Poggi, Excell Seltzer, MD   100 mg at 03/31/23 2125   enoxaparin (LOVENOX) injection 40 mg  40 mg Subcutaneous Q24H Poggi, Excell Seltzer, MD   40 mg at 03/31/23 2440   ferrous sulfate tablet 325 mg  325 mg Oral Q breakfast Poggi, Excell Seltzer, MD   325 mg at  03/31/23 1027   hydrALAZINE (APRESOLINE) injection 5 mg  5 mg Intravenous Q2H PRN Poggi, Excell Seltzer, MD       insulin aspart (novoLOG) injection 0-15 Units  0-15 Units Subcutaneous TID WC Poggi, Excell Seltzer, MD   2 Units at 03/31/23 1702   insulin aspart (novoLOG) injection 0-5 Units  0-5 Units Subcutaneous QHS Christena Flake, MD   2 Units at 03/31/23 2126   levothyroxine (SYNTHROID) tablet 88 mcg  88 mcg Oral QAC breakfast Christena Flake, MD   88 mcg at 04/01/23 0557   loratadine (CLARITIN) tablet 10 mg  10 mg Oral QPM Poggi, Excell Seltzer, MD   10 mg at 03/31/23 1703   magnesium hydroxide (MILK OF MAGNESIA) suspension 30 mL  30 mL Oral Daily PRN Poggi, Excell Seltzer, MD       metoCLOPramide (REGLAN) tablet 5-10 mg  5-10 mg Oral Q8H PRN Poggi, Excell Seltzer, MD       Or   metoCLOPramide (REGLAN) injection 5-10 mg  5-10 mg Intravenous Q8H PRN Poggi, Excell Seltzer, MD       mometasone-formoterol (DULERA) 200-5 MCG/ACT inhaler 2 puff  2 puff Inhalation BID Poggi, Excell Seltzer, MD   2 puff at 04/01/23 1119   montelukast (SINGULAIR) tablet 10 mg  10 mg Oral QHS Poggi, Excell Seltzer, MD   10 mg at 03/31/23 2125   morphine (PF) 2 MG/ML injection 2-4 mg  2-4 mg Intravenous Q3H PRN Poggi, Excell Seltzer, MD   2 mg at 04/01/23 1110   ondansetron (ZOFRAN) tablet 4 mg  4 mg Oral Q6H PRN Poggi,  Excell Seltzer, MD       Or   ondansetron [facility]) injection 4 mg  4 mg Intravenous Q6H PRN Poggi, Excell Seltzer, MD       oxyCODONE (Oxy IR/ROXICODONE) immediate release tablet 5 mg  5 mg Oral Q6H PRN Poggi, Excell Seltzer, MD   5 mg at 04/01/23 0602   rosuvastatin (CRESTOR) tablet 10 mg  10 mg Oral Daily Poggi, Excell Seltzer, MD   10 mg at 03/30/23 1710   sodium chloride 0.9 % bolus 500 mL  500 mL Intravenous Once Lorretta Harp, MD       sodium phosphate (FLEET) 7-19 GM/118ML enema 1 enema  1 enema Rectal Once PRN Poggi, Excell Seltzer, MD       vancomycin (VANCOCIN) IVPB 1000 mg/200 mL premix  1,000 mg Intravenous Q24H Coulter, Eber Jones, RPH   Stopped at 03/31/23 2237     Abtx:  Anti-infectives (From admission,  onward)    Start     Dose/Rate Route Frequency Ordered Stop   04/01/23 1330  Ampicillin-Sulbactam (UNASYN) 3 g in sodium chloride 0.9 % 100 mL IVPB        3 g 200 mL/hr over 30 Minutes Intravenous 4 times daily 04/01/23 1233     03/31/23 2200  vancomycin (VANCOREADY) IVPB 750 mg/150 mL  Status:  Discontinued       Placed in "Followed by" Linked Group   750 mg 150 mL/hr over 60 Minutes Intravenous Every 24 hours 03/30/23 0809 03/31/23 0909   03/31/23 2200  cefTRIAXone (ROCEPHIN) 1 g in sodium chloride 0.9 % 100 mL IVPB  Status:  Discontinued        1 g 200 mL/hr over 30 Minutes Intravenous Every 24 hours 03/30/23 2211 03/31/23 1214   03/31/23 2200  vancomycin (VANCOCIN) IVPB 1000 mg/200 mL premix       Placed in "Followed by" Linked Group   1,000 mg 200 mL/hr over 60 Minutes Intravenous Every 24 hours 03/31/23 0909     03/31/23 0600  cefTRIAXone (ROCEPHIN) 1 g in sodium chloride 0.9 % 100 mL IVPB  Status:  Discontinued        1 g 200 mL/hr over 30 Minutes Intravenous Every 24 hours 03/30/23 0755 03/30/23 2211   03/30/23 2300  ceFAZolin (ANCEF) IVPB 2g/100 mL premix        2 g 200 mL/hr over 30 Minutes Intravenous Every 8 hours 03/30/23 2204 03/31/23 1558   03/30/23 2108  ceFAZolin (ANCEF) IVPB 2g/100 mL premix       Note to Pharmacy: In OR prior to surgery   2 g 200 mL/hr over 30 Minutes Intravenous 30 min pre-op 03/30/23 2108     03/30/23 0815  vancomycin (VANCOREADY) IVPB 1500 mg/300 mL       Placed in "Followed by" Linked Group   1,500 mg 150 mL/hr over 120 Minutes Intravenous  Once 03/30/23 0809 03/30/23 2246   03/30/23 0330  cefTRIAXone (ROCEPHIN) 1 g in sodium chloride 0.9 % 100 mL IVPB        1 g 200 mL/hr over 30 Minutes Intravenous  Once 03/30/23 0325 03/30/23 0528       REVIEW OF SYSTEMS:  Const: negative fever, negative chills, negative weight loss Eyes: negative diplopia or visual changes, negative eye pain ENT: negative coryza, negative sore throat Resp: negative  cough, hemoptysis, dyspnea Cards: negative for chest pain, palpitations, lower extremity edema GU: negative for frequency, dysuria and hematuria GI: Negative for abdominal pain, diarrhea, bleeding, constipation Skin: negative  for rash and pruritus Heme: negative for easy bruising and gum/nose bleeding MS: Pain right hand and fingers, inability to flex her fingers Neurolo:negative for headaches, dizziness, vertigo, memory problems  Psych: negative for feelings of anxiety, depression  Endocrine:  thyroid, diabetes Allergy/Immunology-pravastatin and tramadol: Objective:  VITALS:  BP 129/79   Pulse 82   Temp 99.2 F (37.3 C) (Oral)   Resp 17   Ht 5\' 4"  (1.626 m)   Wt 65.3 kg   SpO2 97%   BMI 24.72 kg/m   PHYSICAL EXAM:  General: Alert, cooperative, some distress,   Head: Normocephalic, without obvious abnormality, atraumatic. Eyes: Conjunctivae clear, anicteric sclerae. Pupils are equal ENT did not examine  neck:  symmetrical, no adenopathy, thyroid: non tender  Lungs: Clear to auscultation bilaterally. No Wheezing or Rhonchi. No rales. Heart: S1-S2 Abdomen: Soft,  Extremities: Right arm surgical dressing not removed Pictures reviewed     Skin: No rashes or lesions. Or bruising Lymph: Cervical, supraclavicular normal. Neurologic: Grossly non-focal Pertinent Labs Lab Results CBC    Component Value Date/Time   WBC 10.3 04/01/2023 0911   RBC 3.38 (L) 04/01/2023 0911   HGB 10.2 (L) 04/01/2023 0911   HCT 32.0 (L) 04/01/2023 0911   PLT 261 04/01/2023 0911   MCV 94.7 04/01/2023 0911   MCH 30.2 04/01/2023 0911   MCHC 31.9 04/01/2023 0911   RDW 14.4 04/01/2023 0911   LYMPHSABS 1.3 03/30/2023 0032   MONOABS 1.2 (H) 03/30/2023 0032   EOSABS 0.0 03/30/2023 0032   BASOSABS 0.0 03/30/2023 0032       Latest Ref Rng & Units 04/01/2023    9:11 AM 03/31/2023    1:41 AM 03/30/2023   12:32 AM  CMP  Glucose 70 - 99 mg/dL  161  096   BUN 8 - 23 mg/dL  18  32   Creatinine  0.45 - 1.00 mg/dL 4.09  8.11  9.14   Sodium 135 - 145 mmol/L  138  134   Potassium 3.5 - 5.1 mmol/L  4.0  3.7   Chloride 98 - 111 mmol/L  110  103   CO2 22 - 32 mmol/L  21  25   Calcium 8.9 - 10.3 mg/dL  7.8  9.2   Total Protein 6.5 - 8.1 g/dL   8.0   Total Bilirubin 0.3 - 1.2 mg/dL   0.4   Alkaline Phos 38 - 126 U/L   103   AST 15 - 41 U/L   59   ALT 0 - 44 U/L   99       Microbiology: Recent Results (from the past 240 hour(s))  Blood culture (routine x 2)     Status: None (Preliminary result)   Collection Time: 03/30/23 12:32 AM   Specimen: BLOOD  Result Value Ref Range Status   Specimen Description BLOOD LEFT AC  Final   Special Requests   Final    BOTTLES DRAWN AEROBIC AND ANAEROBIC Blood Culture results may not be optimal due to an excessive volume of blood received in culture bottles   Culture   Final    NO GROWTH 2 DAYS Performed at [facility], [address] [place], [place] [address]    Report Status PENDING  Incomplete  Blood culture (routine x 2)     Status: None (Preliminary result)   Collection Time: 03/30/23  4:01 AM   Specimen: BLOOD  Result Value Ref Range Status   Specimen Description BLOOD LEFT ARM  Final  Special Requests   Final    BOTTLES DRAWN AEROBIC AND ANAEROBIC Blood Culture results may not be optimal due to an inadequate volume of blood received in culture bottles   Culture   Final    NO GROWTH 2 DAYS Performed at [facility], [address] [place], [place] [address]    Report Status PENDING  Incomplete  Aerobic/Anaerobic Culture w Gram Stain (surgical/deep wound)     Status: None (Preliminary result)   Collection Time: 03/30/23  7:41 PM   Specimen: PATH Soft tissue  Result Value Ref Range Status   Specimen Description   Final    ABSCESS Performed at [facility], [address] [place], [place] [address]    Special Requests   Final    SOFT TISSUE Performed at [facility], [address] [place], [place] [address]    Gram Stain   Final    RARE WBC PRESENT, PREDOMINANTLY PMN NO ORGANISMS SEEN Performed at [facility], [address] [place], [place] [address]    Culture FEW STAPHYLOCOCCUS AUREUS  Final   Report Status PENDING  Incomplete  Aerobic/Anaerobic Culture w Gram Stain (surgical/deep wound)     Status: None (Preliminary result)   Collection Time: 03/30/23  7:45 PM   Specimen: PATH Soft tissue  Result Value Ref Range Status   Specimen Description   Final    ABSCESS Performed at [facility], [address], [place], [place] [address]    Special Requests   Final    SOFT TISSUE Performed at [facility], [address] [place], [place] [address]    Gram Stain   Final    RARE WBC PRESENT, PREDOMINANTLY PMN RARE GRAM POSITIVE COCCI Performed at [facility], [address] [place], [place] [address]    Culture MODERATE STAPHYLOCOCCUS AUREUS  Final   Report Status PENDING  Incomplete    IMAGING RESULTS: CT hand  Abscess between 2nd/3d metacarpal I have personally reviewed the films ? Impression/Recommendation Staph aureus infection of the right hand Apparently 2 weeks ago patient sustained significant trauma to the right hand which could have been metal wire scratching on her?index finger base There was some question of whether the dog scratch but apparently that it is not the case She was started on prednisone on 03/27/2023 for possible uric acid and possibly the infection was worsened. Patient has had 2 I&D's She is currently on vancomycin and Unasyn Will change the vancomycin to linezolid for better soft tissue penetration as well as and tight oxygen effect  CKD  Diabetes mellitus was on metformin and dapagliflozin.  Currently on insulin  Hypertension was taking losartan  Hyperlipidemia on rosuvastatin  Hypothyroidism on Synthroid  Asthma/COPD on  inhalers ___________________________________________________ Discussed with patient, her family at bedside and Dr. Allena Katz and with orthopedic surgeon [facility] on-call this weekend Available for urgent issues 0 Note:  This document was prepared using Dragon voice recognition software and may include unintentional dictation errors.

## 2023-04-01 NOTE — Op Note (Signed)
04/01/2023  6:07 PM  Patient:   Wanda Smith  Pre-Op Diagnosis:   Right hand abscess status post dog scratch  Post-Op Diagnosis:   Same  Procedure:   Irrigation and debridement of right hand abscess from palmar and dorsal approaches  Surgeon:   Maryagnes Amos, MD  Assistant:   None  Anesthesia:   General LMA  Findings:   As above.  Complications:   None  Fluids:   600 cc crystalloid  EBL:   5 cc  UOP:   None  TT:   58 minutes at 250 mmHg  Drains:   Penrose x 1, Prevena x 1  Closure:   4-0 Prolene interrupted sutures  Brief Clinical Note:   The patient is a 62 year old female who sustained a scratch to the palmar aspect of her hand near the index MCP flexion crease about 1 week ago.  She presented to the emergency room 3 days ago and was taken to the OR 2 days ago for an irrigation and debridement of the wound and underlying abscess.  The flexor sheath was explored and found to be clear of infection.  Despite continued IV antibiotics, her hand exam has worsened.  A repeat CT scan suggested an enlarging abscess between the index and long metacarpals.  The patient presents at this time for a repeat irrigation debridement of her right hand wound with re-irrigation through the palmar incision and further irrigation and debridement through a dorsal incision  Procedure:   The patient was brought into the operating room and laid in the supine position.  After adequate general laryngeal mask anesthesia was obtained, the patient's right hand and upper extremity were prepped with ChloraPrep solution before being draped sterilely.  Preoperative antibiotics were administered.  A timeout was performed to verify the appropriate surgical site.  The hand was held in an elevated position for several minutes before the tourniquet was inflated to 250 mmHg.  The sutures were removed from the palmar incision and the wound reopened.  This was carefully explored.  The further purulent material  was identified along the ulnar aspect of the proximal phalanx extending dorsally and proximally.  These areas were debrided thoroughly using Metzenbaum scissors, a small curette, and rongeurs.  Attention was then directed dorsally.  An approximately 4 to 5 cm incision was made over the dorsal aspect of the hand centered between the index and long metacarpals and extending just distal to the index MCP joint.  Assess incision and entered into the subcutaneous tissues, abundant purulent material was identified and expressed.  Cultures were obtained.  This purulent material was removed and blunt exploration carried out across the dorsum of the hand to release any further loculations.  Careful digital palpation and exploration showed that the extent of the infection did not appear to track proximal to the extensor retinacular region nor distal to the MCPs.  Again extensive debridement was performed using both curettes and rongeurs.  More distally, care was taken to be sure that any infection extending over the dorsal aspect of the MCP was identified and cleared out and that connection was made to the volar side of the hand.    In addition, the interosseous muscles between the index and long metacarpals were opened to identify more purulent material.  This area also was debrided thoroughly.  A total of 2000 cc of irrigation was used using bulb irrigation to flush out to the dorsal and palmar incisions.  The palmar incision was reapproximated using 4-0  Prolene interrupted sutures, leaving a Penrose drain in place.  Dorsally, the distalmost extent of the wound was closed sufficiently to permit a Praveena to be applied over the remainder of the wound and to obtain an adequate seal.  Once the Praveena was applied, suction was applied and the adequacy of the seal was verified.  Bulky dressings were applied over the palmar aspect of the hand before the patient was placed into a volar fiberglass splint maintaining the  wrist in neutral position and the fingers in a position of function.  The patient was then awakened, extubated, and returned to the recovery room in satisfactory condition after tolerating the procedure well.

## 2023-04-01 NOTE — Progress Notes (Signed)
[facility]  - [place] at [facility]   PATIENT NAME: Wanda Smith    MR#:  [id number]  DATE OF BIRTH:  Nov 12, 1960  SUBJECTIVE:  Husband at bedside.  Right hand swelling +. CT right hand ordered and showed pus collection. NPO for washout later today in OR No fever  VITALS:  Blood pressure 129/79, pulse 82, temperature 99.2 F (37.3 C), temperature source Oral, resp. rate 17, height 5\' 4"  (1.626 m), weight 65.3 kg, SpO2 97%.  PHYSICAL EXAMINATION:   GENERAL:  62 y.o.-year-old patient with no acute distress.  LUNGS: Normal breath sounds bilaterally, no wheezing CARDIOVASCULAR: S1, S2 normal. No murmur   ABDOMEN: Soft, nontender, nondistended. Bowel sounds present.  EXTREMITIES: No  edema b/l.    Right hand POA 03/30/23   04/01/23     LABORATORY PANEL:  CBC Recent Labs  Lab 04/01/23 0911  WBC 10.3  HGB 10.2*  HCT 32.0*  PLT 261    Chemistries  Recent Labs  Lab 03/30/23 0032 03/31/23 0141 04/01/23 0911  NA 134* 138  --   K 3.7 4.0  --   CL 103 110  --   CO2 25 21*  --   GLUCOSE 128* 190*  --   BUN 32* 18  --   CREATININE 1.29* 1.13* 1.19*  CALCIUM 9.2 7.8*  --   AST 59*  --   --   ALT 99*  --   --   ALKPHOS 103  --   --   BILITOT 0.4  --   --    Cardiac Enzymes No results for input(s): "TROPONINI" in the last 168 hours. RADIOLOGY:  CT HAND RIGHT W CONTRAST  Result Date: 04/01/2023 CLINICAL DATA:  Evaluate for persistent/recurrent abscess right hand EXAM: CT OF THE UPPER RIGHT EXTREMITY WITH CONTRAST TECHNIQUE: Multidetector CT imaging of the upper right extremity was performed according to the standard protocol following intravenous contrast administration. RADIATION DOSE REDUCTION: This exam was performed according to the departmental dose-optimization program which includes automated exposure control, adjustment of the mA and/or kV according to patient size and/or use of iterative reconstruction technique. CONTRAST:  OMNIPAQUE  IOHEXOL 300 MG/ML  SOLN COMPARISON:  CT 03/30/2023 FINDINGS: Bones/Joint/Cartilage No acute fracture. No dislocation. No sites of erosion or periosteal elevation. Unchanged degenerative changes, most notable at the long finger DIP joint. No significant joint effusion at the wrist. Ligaments Suboptimally assessed by CT. Muscles and Tendons Moderate tenosynovitis of the second and fourth extensor compartments, similar to prior. Peripherally enhancing fluid collection is again noted between the second and third metacarpals measuring approximately 4.0 x 0.6 x 1.7 cm (previously 4.0 x 0.3 x 0.9 cm). Soft tissues Marked soft tissue edema and ill-defined fluid along the dorsum of the hand, wrist, and distal forearm. No new rim enhancing fluid collection. No soft tissue gas. IMPRESSION: 1. Slight interval enlargement of peripherally enhancing abscess between the second and third metacarpals measuring approximately 4.0 x 0.6 x 1.7 cm (previously 4.0 x 0.3 x 0.9 cm). 2. Marked cellulitis of the dorsum of the hand, wrist, and distal forearm. No new rim enhancing fluid collection. No soft tissue gas. 3. Moderate tenosynovitis of the second and fourth extensor compartments, similar to prior. 4. No CT evidence of osteomyelitis. Electronically Signed   By: Duanne Guess D.O.   On: 04/01/2023 10:38   CT HAND RIGHT W CONTRAST  Result Date: 03/30/2023 CLINICAL DATA:  Right hand cellulitis, evaluate for abscess. EXAM: CT OF THE UPPER RIGHT  EXTREMITY WITH CONTRAST TECHNIQUE: Multidetector CT imaging of the upper right extremity was performed according to the standard protocol following intravenous contrast administration. RADIATION DOSE REDUCTION: This exam was performed according to the departmental dose-optimization program which includes automated exposure control, adjustment of the mA and/or kV according to patient size and/or use of iterative reconstruction technique. CONTRAST:  OMNIPAQUE IOHEXOL 300 MG/ML  SOLN  COMPARISON:  None Available. FINDINGS: Bones/Joint/Cartilage Osseous structures are within normal limits. No evidence of fracture or osteonecrosis. Scattered cystic changes in the carpal bones, otherwise no significant arthropathy. Ligaments Suboptimally assessed by CT. Muscles and Tendons Thenar and hypothenar muscles are normal in bulk. No intramuscular hematoma or fluid collection. There is a peripherally enhancing hypodense area in the between the second and third metacarpals measuring a proximally 0.9 x 0.3 x 4.0 cm (series 5, image 53; series 8, image 19) suspicious for developing abscess. Soft tissues Marked soft tissue edema about the second digit and dorsum of the hand suggesting cellulitis. IMPRESSION: 1. Marked soft tissue edema about the second digit and dorsum of the hand suggesting cellulitis. 2. There is a peripherally enhancing hypodense area between the second and third metacarpals soft tissues measuring 0.9 x 0.3 x 4.0 cm, suspicious for developing abscess. Clinical correlation and continued follow-up examination is suggested. 3. No evidence of fracture or osteonecrosis. Electronically Signed   By: Larose Hires D.O.   On: 03/30/2023 17:47    Assessment and Plan   Wanda Smith is a 62 y.o. female with medical history significant of HTN, HLD, DM, COPD, hypothyroidism, depression with anxiety, CKD-3A, anemia, who presents with right hand pain, redness and swelling. Pt had a scratch on the right had about a week ago from her dog.  CT right hand--1. Marked soft tissue edema about the second digit and dorsum of the hand suggesting cellulitis. 2. There is a peripherally enhancing hypodense area between the second and third metacarpals soft tissues measuring 0.9 x 0.3 x 4.0 cm, suspicious for developing abscess. Clinical correlation and continued follow-up examination is suggested. 3. No evidence of fracture or osteonecrosis   Severe sepsis due to cellulitis/abscess of right hand: --  Ct showed possible small abscess formation.  Patient has severe sepsis with WBC 16.1, fever of 101.3, RR up to 22.  Lactic acid 1.2 --> 1.6 --> 2.4.  Procalcitonin 0.16.  Patient developed hypotension with blood pressure 88/61, which has responded to IV fluid resuscitation, and treatment with albumin, midodrine and Solu-Cortef.  -- Consulted Dr. Joice Lofts of ortho. Pt is s/p  Irrigation and debridement of right hand abscess with I&D of right index flexor sheath.  --BP much improved -- Empiric antimicrobial treatment with vancomycin and cefazolin (peri-op) - PRN Zofran for nausea, morphine and oxycodone for pain - Blood cultures x 2 --negative - ESR 39  and CRP 3.2 -  trended lactic acid levels down --d/c IVF --WC (deep) rare GPC--staph aureurs --7/26-- repeat CT scan shows pulse collection between second and third metacarpal joint. Patient NPO. Dr Joice Lofts to take to OR for washout. --ID consult with Dr JR--recommends add IV unasyn    Abnormal LFTs: mild.  Likely due to sepsis, Tylenol level less than 10. -Careful use of Tylenol   Type II diabetes mellitus with renal manifestations [facility]): Recent A1c 6.3, well-controlled.  Patient is taking metformin and fark scar -Sliding scale insulin   Hypothyroidism -Synthroid   Hypertension -Hold lisinopril due to severe sepsis   HLD (hyperlipidemia) -Crestor   COPD (chronic obstructive  pulmonary disease) (HCC): Stable -As needed bronchodilators   Chronic kidney disease, stage 3a (HCC): Stable. --follows with Dr Cherylann Ratel   Depression with anxiety -Continue home medications       DVT ppx: SCD  Code Status: Full code   Family Communication: Husband at bedside Procedures: Irrigation and debridement of right hand abscess with I&D of right index flexor sheath.  Level of care: Med-Surg Status is: Inpatient Remains inpatient appropriate because: sepsis due to right hand abscess    TOTAL TIME TAKING CARE OF THIS PATIENT: 35 minutes.  >50% time  spent on counselling and coordination of care  Note: This dictation was prepared with Dragon dictation along with smaller phrase technology. Any transcriptional errors that result from this process are unintentional.  Enedina Finner M.D    [facility]   CC: Primary care physician; Patrice Paradise, MD

## 2023-04-01 NOTE — Consult Note (Signed)
Pharmacy Antibiotic Note  Wanda Smith is a 62 y.o. female admitted on 03/30/2023 with sepsis and cellulitis. PMH significant for hypothyroidism, HTN, COPD, depression, anxiety, HLD, T2DM, CKD3a. Patient presented to the ED for evaluation of erythema and swelling of the right hand and wrist that did not improve with steroids prescribed after another recent ED visit. She sustained a scratch from her dog prior to the development of redness and erythema. Patient underwent I&D 7/24. There was clear evidence of purulence and questionable necrosis observed in the OR. Two separate cultures were obtained. Pharmacy has been consulted for vancomycin dosing.  Plan: Day 3 of antibiotics Continue vancomycin 1000 mg IV Q24H. Goal AUC 400-550. Expected AUC: 532.0 Expected Css min: 13.7 SCr used: 1.19   Weight used: IBW, Vd used: 0.72 (BMI 24.7) Continue to monitor renal function and follow culture results   Height: 5\' 4"  (162.6 cm) Weight: 65.3 kg (144 lb) IBW/kg (Calculated) : 54.7  Temp (24hrs), Avg:98.7 F (37.1 C), Min:98.3 F (36.8 C), Max:99.2 F (37.3 C)  Recent Labs  Lab 03/30/23 0032 03/30/23 1049 03/30/23 1225 03/30/23 1750 03/30/23 2221 03/31/23 0141  WBC 16.1*  --   --   --   --  8.2  CREATININE 1.29*  --   --   --   --  1.13*  LATICACIDVEN 1.2 1.6 2.4* 1.8 1.7 1.5    Estimated Creatinine Clearance: 45.1 mL/min (A) (by C-G formula based on SCr of 1.13 mg/dL (H)).    Allergies  Allergen Reactions   Pravastatin     Abnormal liver function tests   Tramadol Nausea Only    Antimicrobials this admission: 7/24 Vancomycin >>  7/24 Ceftriaxone >> 7/25  Dose adjustments this admission: 7/25 Vancomycin 750 mg IV Q24H >> Vancomycin 1000 mg IV Q24H  Microbiology results: 7/24 BCx: NG2D 7/24 1945 OR Cx: moderate Staph aureus 7/24 1941 OR Cx: few Staph aureus  Thank you for allowing pharmacy to be a part of this patient's care.  Celene Squibb, PharmD Clinical  Pharmacist 04/01/2023 8:32 AM

## 2023-04-01 NOTE — Anesthesia Postprocedure Evaluation (Signed)
Anesthesia Post Note  Patient: Wanda Smith  Procedure(s) Performed: IRRIGATION AND DEBRIDEMENT EXTREMITY PREVENA PLACEMENT (Right: Hand)  Patient location during evaluation: PACU Anesthesia Type: General Level of consciousness: awake and alert Pain management: pain level controlled Vital Signs Assessment: post-procedure vital signs reviewed and stable Respiratory status: spontaneous breathing, nonlabored ventilation, respiratory function stable and patient connected to nasal cannula oxygen Cardiovascular status: blood pressure returned to baseline and stable Postop Assessment: no apparent nausea or vomiting Anesthetic complications: no   No notable events documented.   Last Vitals:  Vitals:   04/01/23 1830 04/01/23 1840  BP: (!) 160/85   Pulse: 88 93  Resp: 15 20  Temp:  37.1 C  SpO2: 90% 95%    Last Pain:  Vitals:   04/01/23 1840  TempSrc:   PainSc: 5                  Corinda Gubler

## 2023-04-01 NOTE — Plan of Care (Signed)
  Problem: Clinical Measurements: Goal: Will remain free from infection Outcome: Progressing   Problem: Nutrition: Goal: Adequate nutrition will be maintained Outcome: Progressing   Problem: Coping: Goal: Level of anxiety will decrease Outcome: Progressing   Problem: Elimination: Goal: Will not experience complications related to bowel motility Outcome: Progressing Goal: Will not experience complications related to urinary retention Outcome: Progressing

## 2023-04-02 DIAGNOSIS — A419 Sepsis, unspecified organism: Secondary | ICD-10-CM | POA: Diagnosis not present

## 2023-04-02 DIAGNOSIS — L02511 Cutaneous abscess of right hand: Secondary | ICD-10-CM | POA: Diagnosis not present

## 2023-04-02 DIAGNOSIS — L03113 Cellulitis of right upper limb: Secondary | ICD-10-CM | POA: Diagnosis not present

## 2023-04-02 DIAGNOSIS — E1129 Type 2 diabetes mellitus with other diabetic kidney complication: Secondary | ICD-10-CM | POA: Diagnosis not present

## 2023-04-02 LAB — GLUCOSE, CAPILLARY
Glucose-Capillary: 146 mg/dL — ABNORMAL HIGH (ref 70–99)
Glucose-Capillary: 144 mg/dL — ABNORMAL HIGH (ref 70–99)
Glucose-Capillary: 136 mg/dL — ABNORMAL HIGH (ref 70–99)
Glucose-Capillary: 111 mg/dL — ABNORMAL HIGH (ref 70–99)

## 2023-04-02 LAB — AEROBIC/ANAEROBIC CULTURE W GRAM STAIN (SURGICAL/DEEP WOUND)

## 2023-04-02 MED ORDER — POTASSIUM CHLORIDE CRYS ER 20 MEQ PO TBCR
40.0000 meq | EXTENDED_RELEASE_TABLET | ORAL | Status: AC
  Administered 2023-04-02 (×2): 40 meq via ORAL
  Filled 2023-04-02 (×2): qty 2

## 2023-04-02 MED ORDER — ORAL CARE MOUTH RINSE: 15.0000 mL | OROMUCOSAL | Status: DC | PRN

## 2023-04-02 MED ORDER — TETANUS-DIPHTH-ACELL PERTUSSIS 5-2.5-18.5 LF-MCG/0.5 IM SUSY
0.5000 mL | PREFILLED_SYRINGE | Freq: Once | INTRAMUSCULAR | Status: AC
  Administered 2023-04-02: 0.5 mL via INTRAMUSCULAR
  Filled 2023-04-02: qty 0.5

## 2023-04-02 NOTE — Plan of Care (Signed)

## 2023-04-02 NOTE — Progress Notes (Signed)
[facility]  - [place] at [facility]   PATIENT NAME: Wanda Smith    MR#:  [id number]  DATE OF BIRTH:  07-14-1961  SUBJECTIVE:  Daughter at bedside.  Right hand swelling improving. Drain+ Pain 5/10 Tolerating po diet  VITALS:  Blood pressure 112/65, pulse 77, temperature 98.7 F (37.1 C), resp. rate 17, height 5\' 4"  (1.626 m), weight 65.3 kg, SpO2 97%.  PHYSICAL EXAMINATION:   GENERAL:  62 y.o.-year-old patient with no acute distress.  LUNGS: Normal breath sounds bilaterally, no wheezing CARDIOVASCULAR: S1, S2 normal. No murmur   ABDOMEN: Soft, nontender, nondistended. Bowel sounds present.  EXTREMITIES: No  edema b/l.    Right hand POA 03/30/23   04/01/23     LABORATORY PANEL:  CBC Recent Labs  Lab 04/02/23 0320  WBC 9.2  HGB 9.5*  HCT 29.0*  PLT 259    Chemistries  Recent Labs  Lab 03/30/23 0032 03/31/23 0141 04/02/23 0320  NA 134*   < > 137  K 3.7   < > 3.1*  CL 103   < > 107  CO2 25   < > 22  GLUCOSE 128*   < > 143*  BUN 32*   < > 8  CREATININE 1.29*   < > 1.12*  CALCIUM 9.2   < > 7.5*  AST 59*  --   --   ALT 99*  --   --   ALKPHOS 103  --   --   BILITOT 0.4  --   --    < > = values in this interval not displayed.   Cardiac Enzymes No results for input(s): "TROPONINI" in the last 168 hours. RADIOLOGY:  CT HAND RIGHT W CONTRAST  Result Date: 04/01/2023 CLINICAL DATA:  Evaluate for persistent/recurrent abscess right hand EXAM: CT OF THE UPPER RIGHT EXTREMITY WITH CONTRAST TECHNIQUE: Multidetector CT imaging of the upper right extremity was performed according to the standard protocol following intravenous contrast administration. RADIATION DOSE REDUCTION: This exam was performed according to the departmental dose-optimization program which includes automated exposure control, adjustment of the mA and/or kV according to patient size and/or use of iterative reconstruction technique. CONTRAST:  OMNIPAQUE IOHEXOL 300 MG/ML   SOLN COMPARISON:  CT 03/30/2023 FINDINGS: Bones/Joint/Cartilage No acute fracture. No dislocation. No sites of erosion or periosteal elevation. Unchanged degenerative changes, most notable at the long finger DIP joint. No significant joint effusion at the wrist. Ligaments Suboptimally assessed by CT. Muscles and Tendons Moderate tenosynovitis of the second and fourth extensor compartments, similar to prior. Peripherally enhancing fluid collection is again noted between the second and third metacarpals measuring approximately 4.0 x 0.6 x 1.7 cm (previously 4.0 x 0.3 x 0.9 cm). Soft tissues Marked soft tissue edema and ill-defined fluid along the dorsum of the hand, wrist, and distal forearm. No new rim enhancing fluid collection. No soft tissue gas. IMPRESSION: 1. Slight interval enlargement of peripherally enhancing abscess between the second and third metacarpals measuring approximately 4.0 x 0.6 x 1.7 cm (previously 4.0 x 0.3 x 0.9 cm). 2. Marked cellulitis of the dorsum of the hand, wrist, and distal forearm. No new rim enhancing fluid collection. No soft tissue gas. 3. Moderate tenosynovitis of the second and fourth extensor compartments, similar to prior. 4. No CT evidence of osteomyelitis. Electronically Signed   By: Wanda Smith D.O.   On: 04/01/2023 10:38    Assessment and Plan   Wanda Smith is a 62 y.o. female with medical history  significant of HTN, HLD, DM, COPD, hypothyroidism, depression with anxiety, CKD-3A, anemia, who presents with right hand pain, redness and swelling. Pt had a scratch on the right had about a week ago from her dog.  CT right hand--1. Marked soft tissue edema about the second digit and dorsum of the hand suggesting cellulitis. 2. There is a peripherally enhancing hypodense area between the second and third metacarpals soft tissues measuring 0.9 x 0.3 x 4.0 cm, suspicious for developing abscess. Clinical correlation and continued follow-up examination is  suggested. 3. No evidence of fracture or osteonecrosis   Severe sepsis due to cellulitis/abscess of right hand: -- Ct showed possible small abscess formation.  Patient has severe sepsis with WBC 16.1, fever of 101.3, RR up to 22.  Lactic acid 1.2 --> 1.6 --> 2.4.  Procalcitonin 0.16.  Patient developed hypotension with blood pressure 88/61, which has responded to IV fluid resuscitation, and treatment with albumin, midodrine and Solu-Cortef.  -- Consulted Dr. Joice Lofts of ortho. Pt is s/p  Irrigation and debridement of right hand abscess with I&D of right index flexor sheath.  --BP much improved -- Empiric antimicrobial treatment with vancomycin and cefazolin (peri-op) - PRN Zofran for nausea, morphine and oxycodone for pain - Blood cultures x 2 --negative - ESR 39  and CRP 3.2 -  trended lactic acid levels down --d/c IVF --WC (deep) rare GPC--staph aureurs --7/26-- repeat CT scan shows pulse collection between second and third metacarpal joint. Patient NPO. Dr Joice Lofts to take to OR for washout. --ID consult with Dr JR--recommends Linezolid and IV unasyn --7/27--pt overall feeling better. Drain + pain 5/10    Abnormal LFTs: mild.  Likely due to sepsis, Tylenol level less than 10.   Type II diabetes mellitus with renal manifestations [facility]): Recent A1c 6.3, well-controlled.  Patient is taking metformin and fark scar -Sliding scale insulin   Hypothyroidism -Synthroid   Hypertension -Hold lisinopril due to severe sepsis   HLD (hyperlipidemia) -Crestor   COPD (chronic obstructive pulmonary disease) (HCC): Stable -As needed bronchodilators   Chronic kidney disease, stage 3a (HCC): Stable. --follows with Dr Cherylann Ratel   Depression with anxiety -Continue home medications       DVT ppx: SCD  Code Status: Full code   Family Communication: dter at bedside Procedures: Irrigation and debridement of right hand abscess with I&D of right index flexor sheath. X2  Level of care: Med-Surg Status  is: Inpatient Remains inpatient appropriate because: sepsis due to right hand abscess    TOTAL TIME TAKING CARE OF THIS PATIENT: 35 minutes.  >50% time spent on counselling and coordination of care  Note: This dictation was prepared with Dragon dictation along with smaller phrase technology. Any transcriptional errors that result from this process are unintentional.  Enedina Finner M.D    [facility]   CC: Primary care physician; Patrice Paradise, MD

## 2023-04-02 NOTE — Progress Notes (Signed)
Physical Therapy Screen/Discharge in house, no skilled needs Patient Details Name: [name] MRN: [id number] DOB: 02-May-1961 Today's Date: 04/02/2023 Time: 10:13-10:20    Wanda Smith is a 62 y.o. female with medical history significant of HTN, HLD, DM, COPD, hypothyroidism, depression with anxiety, CKD-3A, anemia, who presents with right hand pain, redness and swelling. Her finger was scratched by her dog a few days ago. She was diagnosed with cellulitis and sepsis in right hand. She is s/p I&D to right hand and is NWB in RUE.  Patient is right hand dominant. She reports being able to get up and move around room without difficulty. She reports using her LUE for dressing and is able to clean herself after using bathroom. She reports being able to get on socks with her left hand. She lives at home with her husband who is also recovering from recent cervical surgery. She reports he is mostly independent in ADLs at this time and would be able to assist her if needed. She was able to stand at bedside with no imbalance. She reports no concerns at this time and feels she is functioning at baseline other than being NWB in RUE. Will sign off at this time. Informed patient that if anything changes to reach out to MD for another referral.   Querida Beretta PT, DPT 04/02/2023, 3:58 PM

## 2023-04-02 NOTE — Consult Note (Signed)
PHARMACY CONSULT NOTE  Pharmacy Consult for Electrolyte Monitoring and Replacement   Recent Labs: Potassium (mmol/L)  Date Value  04/02/2023 3.1 (L)   Calcium (mg/dL)  Date Value  16/06/9603 7.5 (L)   Albumin (g/dL)  Date Value  54/05/8118 4.9   Sodium (mmol/L)  Date Value  04/02/2023 137   Assessment: 62 y/o F admitted with cellulitis / abscess of right hand s/p dog scratch. Patient underwent irrigation and debridement w/ orthopaedics 7/26. Pharmacy consulted to assist with electrolyte monitoring and replacement.  Goal of Therapy:  Electrolytes within normal limits  Plan:  --K 3.1, Kcl 40 mEq PO q4h x 2 doses --Follow-up electrolytes with AM labs tomorrow  Tressie Ellis 04/02/2023 8:00 AM

## 2023-04-02 NOTE — Plan of Care (Signed)
  Problem: Coping: Goal: Ability to adjust to condition or change in health will improve Outcome: Progressing   Problem: Coping: Goal: Ability to adjust to condition or change in health will improve 04/02/2023 2252 by Leonie Douglas, RN Outcome: Progressing 04/02/2023 2252 by Leonie Douglas, RN Outcome: Progressing   Problem: Fluid Volume: Goal: Ability to maintain a balanced intake and output will improve Outcome: Progressing   Problem: Nutritional: Goal: Maintenance of adequate nutrition will improve Outcome: Progressing

## 2023-04-02 NOTE — Progress Notes (Addendum)
Subjective: 1 Day Post-Op Procedure(s) (LRB): IRRIGATION AND DEBRIDEMENT EXTREMITY PREVENA PLACEMENT (Right) Patient reports pain as mild.  Patient has seen overall improvement and erythema, swelling, pain.  Swelling along the forearm as well as redness is much improved since surgery yesterday.  No numbness or tingling.  Overall patient doing well no complaints.   Objective: Vital signs in last 24 hours: Temp:  [98 F (36.7 C)-101.5 F (38.6 C)] 98.7 F (37.1 C) (07/27 0805) Pulse Rate:  [62-93] 77 (07/27 0805) Resp:  [12-20] 17 (07/27 0805) BP: (96-160)/(58-85) 112/65 (07/27 0805) SpO2:  [89 %-100 %] 97 % (07/27 0805) Weight:  [65.3 kg] 65.3 kg (07/26 1601)  Intake/Output from previous day: 07/26 0701 - 07/27 0700 In: 1492.6 [I.V.:782; IV Piggyback:710.6] Out: 5 [Blood:5] Intake/Output this shift: No intake/output data recorded.  Recent Labs    03/31/23 0141 04/01/23 0911 04/02/23 0320  HGB 10.1* 10.2* 9.5*   Recent Labs    04/01/23 0911 04/02/23 0320  WBC 10.3 9.2  RBC 3.38* 3.16*  HCT 32.0* 29.0*  PLT 261 259   Recent Labs    03/31/23 0141 04/01/23 0911 04/02/23 0320  NA 138  --  137  K 4.0  --  3.1*  CL 110  --  107  CO2 21*  --  22  BUN 18  --  8  CREATININE 1.13* 1.19* 1.12*  GLUCOSE 190*  --  143*  CALCIUM 7.8*  --  7.5*   No results for input(s): "LABPT", "INR" in the last 72 hours.  EXAM General - Patient is Alert, Appropriate, and Oriented Right upper extremity-right upper extremity with splint and Ace wrap intact with Prevena along the dorsum of the hand.  There is no drainage in the canister.  Patient has soft compartments throughout the forearm.  There appears to be improving erythema along the forearm/elbow region.  Patient has sensation that is intact throughout the digits of the right hand.  Past Medical History:  Diagnosis Date   Anemia    Anxiety    Arthritis    knee (R)   Asthma    Chronic kidney disease    Stage 3 sees  Lateef   COPD (chronic obstructive pulmonary disease) (HCC)    Depression    Diabetes mellitus without complication (HCC)    Dyspnea    GERD (gastroesophageal reflux disease)    occ   Hypertension    Hypothyroidism    Increased serum lipids    Wears contact lenses     Assessment/Plan:   1 Day Post-Op Procedure(s) (LRB): IRRIGATION AND DEBRIDEMENT EXTREMITY PREVENA PLACEMENT (Right) Principal Problem:   Cellulitis of right hand Active Problems:   Hypothyroidism   Hypertension   COPD (chronic obstructive pulmonary disease) (HCC)   Depression with anxiety   HLD (hyperlipidemia)   Type II diabetes mellitus with renal manifestations (HCC)   Chronic kidney disease, stage 3a (HCC)   Abnormal LFTs   Severe sepsis (HCC)   Abscess of right hand  Estimated body mass index is 24.72 kg/m as calculated from the following:   Height as of this encounter: 5\' 4"  (1.626 m).   Weight as of this encounter: 65.3 kg.  Patient doing well today.  Overall pain swelling and redness improving.  Plan on dressing change tomorrow and we will advance the Penrose drain along the palmar aspect of the hand.  Continue with IV antibiotics.  Cultures pending.  Lollie Marrow, PA-C [facility] 04/02/2023, 9:29 AM   Patient  seen and examined, agree with above plan.  The patient is doing well status post right hand I&D POD 1, no concerns at this time. Swelling and pain improved. Pain is controlled. Discussed antibiotic use, plan for dressing and drain. Recommended a posey foam block to help keep the arm elevated. Discussed with PT who will obtain for the patient.  All questions answered the patient agrees with above plan.  Reinaldo Berber MD

## 2023-04-02 NOTE — Plan of Care (Signed)

## 2023-04-03 DIAGNOSIS — E1129 Type 2 diabetes mellitus with other diabetic kidney complication: Secondary | ICD-10-CM | POA: Diagnosis not present

## 2023-04-03 DIAGNOSIS — L03113 Cellulitis of right upper limb: Secondary | ICD-10-CM | POA: Diagnosis not present

## 2023-04-03 DIAGNOSIS — L02511 Cutaneous abscess of right hand: Secondary | ICD-10-CM | POA: Diagnosis not present

## 2023-04-03 DIAGNOSIS — A419 Sepsis, unspecified organism: Secondary | ICD-10-CM | POA: Diagnosis not present

## 2023-04-03 LAB — BASIC METABOLIC PANEL WITH GFR
Anion gap: 7 (ref 5–15)
BUN: 6 mg/dL — ABNORMAL LOW (ref 8–23)
CO2: 22 mmol/L (ref 22–32)
Calcium: 7.7 mg/dL — ABNORMAL LOW (ref 8.9–10.3)
Chloride: 111 mmol/L (ref 98–111)
Creatinine, Ser: 1.03 mg/dL — ABNORMAL HIGH (ref 0.44–1.00)
GFR, Estimated: >60 mL/min (ref >60)
Glucose, Bld: 126 mg/dL — ABNORMAL HIGH (ref 70–99)
Potassium: 3.8 mmol/L (ref 3.5–5.1)
Sodium: 140 mmol/L (ref 135–145)

## 2023-04-03 LAB — GLUCOSE, CAPILLARY
Glucose-Capillary: 115 mg/dL — ABNORMAL HIGH (ref 70–99)
Glucose-Capillary: 136 mg/dL — ABNORMAL HIGH (ref 70–99)
Glucose-Capillary: 133 mg/dL — ABNORMAL HIGH (ref 70–99)
Glucose-Capillary: 107 mg/dL — ABNORMAL HIGH (ref 70–99)

## 2023-04-03 LAB — CBC
HCT: 29.4 % — ABNORMAL LOW (ref 36.0–46.0)
Hemoglobin: 9.6 g/dL — ABNORMAL LOW (ref 12.0–15.0)
MCH: 30.6 pg (ref 26.0–34.0)
MCHC: 32.7 g/dL (ref 30.0–36.0)
MCV: 93.6 fL (ref 80.0–100.0)
Platelets: 249 10*3/uL (ref 150–400)
RBC: 3.14 MIL/uL — ABNORMAL LOW (ref 3.87–5.11)
RDW: 14.6 % (ref 11.5–15.5)
WBC: 6.2 10*3/uL (ref 4.0–10.5)
nRBC: 0.3 % — ABNORMAL HIGH (ref 0.0–0.2)

## 2023-04-03 MED ORDER — IBUPROFEN 400 MG PO TABS
400.0000 mg | ORAL_TABLET | Freq: Four times a day (QID) | ORAL | Status: DC | PRN
  Administered 2023-04-03 – 2023-04-04 (×4): 400 mg via ORAL
  Filled 2023-04-03 (×4): qty 1

## 2023-04-03 MED ORDER — LINEZOLID 600 MG PO TABS
600.0000 mg | ORAL_TABLET | Freq: Two times a day (BID) | ORAL | Status: DC
  Administered 2023-04-03 – 2023-04-07 (×9): 600 mg via ORAL
  Filled 2023-04-03 (×9): qty 1

## 2023-04-03 MED ORDER — DIPHENHYDRAMINE HCL 25 MG PO CAPS
25.0000 mg | ORAL_CAPSULE | Freq: Three times a day (TID) | ORAL | Status: DC | PRN
  Administered 2023-04-03: 25 mg via ORAL
  Filled 2023-04-03: qty 1

## 2023-04-03 NOTE — Progress Notes (Signed)
PHARMACIST - PHYSICIAN COMMUNICATION  CONCERNING: Antibiotic IV to Oral Route Change Policy  RECOMMENDATION: This patient is receiving linezolid by the intravenous route.  Based on criteria approved by the Pharmacy and Therapeutics Committee, the antibiotic(s) is/are being converted to the equivalent oral dose form(s).  DESCRIPTION: These criteria include: Patient being treated for a respiratory tract infection, urinary tract infection, cellulitis or clostridium difficile associated diarrhea if on metronidazole The patient is not neutropenic and does not exhibit a GI malabsorption state The patient is eating (either orally or via tube) and/or has been taking other orally administered medications for a least 24 hours The patient is improving clinically and has a Tmax < 100.5  If you have questions about this conversion, please contact the Pharmacy Department   Tressie Ellis 04/03/23

## 2023-04-03 NOTE — Progress Notes (Signed)
[facility]  - [place] [facility]   PATIENT NAME: Wanda Smith    MR#:  [id number]  DATE OF BIRTH:  03/23/1961  SUBJECTIVE:  Daughter at bedside.  Right hand swelling improving. Drain+ Itching all over thinks form dilaudid Tolerating po diet  VITALS:  Blood pressure (!) 154/95, pulse 73, temperature 98.8 F (37.1 C), resp. rate 16, height 5\' 4"  (1.626 m), weight 65.3 kg, SpO2 98%.  PHYSICAL EXAMINATION:   GENERAL:  62 y.o.-year-old patient with no acute distress.  LUNGS: Normal breath sounds bilaterally, no wheezing CARDIOVASCULAR: S1, S2 normal. No murmur   ABDOMEN: Soft, nontender, nondistended. Bowel sounds present.  EXTREMITIES: No  edema b/l.    Right hand POA 03/30/23   04/01/23    04/03/2023  LABORATORY PANEL:  CBC Recent Labs  Lab 04/03/23 0504  WBC 6.2  HGB 9.6*  HCT 29.4*  PLT 249    Chemistries  Recent Labs  Lab 03/30/23 0032 03/31/23 0141 04/03/23 0504  NA 134*   < > 140  K 3.7   < > 3.8  CL 103   < > 111  CO2 25   < > 22  GLUCOSE 128*   < > 126*  BUN 32*   < > 6*  CREATININE 1.29*   < > 1.03*  CALCIUM 9.2   < > 7.7*  AST 59*  --   --   ALT 99*  --   --   ALKPHOS 103  --   --   BILITOT 0.4  --   --    < > = values in this interval not displayed.   Assessment and Plan   XITLALIC GLASCOCK is a 62 y.o. female with medical history significant of HTN, HLD, DM, COPD, hypothyroidism, depression with anxiety, CKD-3A, anemia, who presents with right hand pain, redness and swelling. Pt had a scratch on the right had about a week ago from her dog.  CT right hand--1. Marked soft tissue edema about the second digit and dorsum of the hand suggesting cellulitis. 2. There is a peripherally enhancing hypodense area between the second and third metacarpals soft tissues measuring 0.9 x 0.3 x 4.0 cm, suspicious for developing abscess. Clinical correlation and continued follow-up examination is suggested. 3. No evidence of fracture  or osteonecrosis   Severe sepsis due to cellulitis/abscess of right hand: -- Ct showed possible small abscess formation.  Patient has severe sepsis with WBC 16.1, fever of 101.3, RR up to 22.  Lactic acid 1.2 --> 1.6 --> 2.4.  Procalcitonin 0.16.  Patient developed hypotension with blood pressure 88/61, which has responded to IV fluid resuscitation, and treatment with albumin, midodrine and Solu-Cortef.  -- Consulted Dr. Joice Lofts of ortho. Pt is s/p  Irrigation and debridement of right hand abscess with I&D of right index flexor sheath.  --BP much improved -- Empiric antimicrobial treatment with vancomycin and cefazolin (peri-op) - PRN Zofran for nausea, morphine and oxycodone for pain - Blood cultures x 2 --negative - ESR 39  and CRP 3.2 -  trended lactic acid levels down --d/c IVF --WC (deep) rare GPC--staph aureurs --7/26-- repeat CT scan shows pulse collection between second and third metacarpal joint. Patient NPO. Dr Joice Lofts to take to OR for washout. --ID consult with Dr JR--recommends Linezolid and IV unasyn --7/27--pt overall feeling better. Drain + pain 5/10 --7/28-- itching all over the face neck likely due to Dilaudid. Patient agreeable to take ibuprofen with meals and PRN [name] if needed  Abnormal LFTs: mild.  Likely due to sepsis, Tylenol level less than 10.   Type II diabetes mellitus with renal manifestations [facility]): Recent A1c 6.3, well-controlled.  Patient is taking metformin and fark scar -Sliding scale insulin   Hypothyroidism -Synthroid   Hypertension -Hold lisinopril due to severe sepsis   HLD (hyperlipidemia) -Crestor   COPD (chronic obstructive pulmonary disease) (HCC): Stable -As needed bronchodilators   Chronic kidney disease, stage 3a (HCC): Stable. --follows with Dr Cherylann Ratel   Depression with anxiety -Continue home medications       DVT ppx: SCD  Code Status: Full code   Family Communication: dter at bedside Procedures: Irrigation and debridement of  right hand abscess with I&D of right index flexor sheath. X2  Level of care: Med-Surg Status is: Inpatient Remains inpatient appropriate because: sepsis due to right hand abscess    TOTAL TIME TAKING CARE OF THIS PATIENT: 35 minutes.  >50% time spent on counselling and coordination of care  Note: This dictation was prepared with Dragon dictation along with smaller phrase technology. Any transcriptional errors that result from this process are unintentional.  Enedina Finner M.D    [facility]   CC: Primary care physician; Patrice Paradise, MD

## 2023-04-03 NOTE — Plan of Care (Signed)

## 2023-04-03 NOTE — Plan of Care (Signed)
  Problem: Education: Goal: Ability to describe self-care measures that may prevent or decrease complications (Diabetes Survival Skills Education) will improve Outcome: Progressing   Problem: Coping: Goal: Ability to adjust to condition or change in health will improve Outcome: Progressing   Problem: Fluid Volume: Goal: Ability to maintain a balanced intake and output will improve Outcome: Progressing   Problem: Health Behavior/Discharge Planning: Goal: Ability to identify and utilize available resources and services will improve Outcome: Progressing Goal: Ability to manage health-related needs will improve Outcome: Progressing   Problem: Metabolic: Goal: Ability to maintain appropriate glucose levels will improve Outcome: Progressing   Problem: Nutritional: Goal: Maintenance of adequate nutrition will improve Outcome: Progressing   Problem: Skin Integrity: Goal: Risk for impaired skin integrity will decrease Outcome: Progressing   Problem: Tissue Perfusion: Goal: Adequacy of tissue perfusion will improve Outcome: Progressing   Problem: Clinical Measurements: Goal: Ability to maintain clinical measurements within normal limits will improve Outcome: Progressing Goal: Will remain free from infection Outcome: Progressing

## 2023-04-03 NOTE — Progress Notes (Signed)
   Subjective: 2 Days Post-Op Procedure(s) (LRB): IRRIGATION AND DEBRIDEMENT EXTREMITY PREVENA PLACEMENT (Right) Patient states pain and swelling throughout hand and forearm slowly improving. No worsening symptoms overnight.  No numbness or tingling.  Overall patient doing well no complaints.   Objective: Vital signs in last 24 hours: Temp:  [98.1 F (36.7 C)-98.9 F (37.2 C)] 98.8 F (37.1 C) (07/28 0720) Pulse Rate:  [66-75] 73 (07/28 0720) Resp:  [15-16] 16 (07/28 0720) BP: (107-154)/(67-95) 154/95 (07/28 0720) SpO2:  [94 %-98 %] 98 % (07/28 0720)  Intake/Output from previous day: No intake/output data recorded. Intake/Output this shift: No intake/output data recorded.  Recent Labs    04/01/23 0911 04/02/23 0320 04/03/23 0504  HGB 10.2* 9.5* 9.6*   Recent Labs    04/02/23 0320 04/03/23 0504  WBC 9.2 6.2  RBC 3.16* 3.14*  HCT 29.0* 29.4*  PLT 259 249   Recent Labs    04/02/23 0320 04/03/23 0504  NA 137 140  K 3.1* 3.8  CL 107 111  CO2 22 22  BUN 8 6*  CREATININE 1.12* 1.03*  GLUCOSE 143* 126*  CALCIUM 7.5* 7.7*   No results for input(s): "LABPT", "INR" in the last 72 hours.  EXAM General - Patient is Alert, Appropriate, and Oriented Right upper extremity-right upper extremity with splint and Ace wrap intact with Prevena along the dorsum of the hand.  There is no drainage in the canister, minimal clear d/c in tubing system.  Patient has stable soft compartments throughout the forearm, mild swelling noted, slightly improved from yesterday. Provena intact along dorsum of hand. Volar incision intact with pinrose drain present, PR drain advanced 1 cm. No purulent drainage. Patient has sensation that is intact throughout the digits of the right hand.       Past Medical History:  Diagnosis Date   Anemia    Anxiety    Arthritis    knee (R)   Asthma    Chronic kidney disease    Stage 3 sees Lateef   COPD (chronic obstructive pulmonary disease) (HCC)     Depression    Diabetes mellitus without complication (HCC)    Dyspnea    GERD (gastroesophageal reflux disease)    occ   Hypertension    Hypothyroidism    Increased serum lipids    Wears contact lenses     Assessment/Plan:   2 Days Post-Op Procedure(s) (LRB): IRRIGATION AND DEBRIDEMENT EXTREMITY PREVENA PLACEMENT (Right) Principal Problem:   Cellulitis of right hand Active Problems:   Hypothyroidism   Hypertension   COPD (chronic obstructive pulmonary disease) (HCC)   Depression with anxiety   HLD (hyperlipidemia)   Type II diabetes mellitus with renal manifestations (HCC)   Chronic kidney disease, stage 3a (HCC)   Abnormal LFTs   Severe sepsis (HCC)   Abscess of right hand  Estimated body mass index is 24.72 kg/m as calculated from the following:   Height as of this encounter: 5\' 4"  (1.626 m).   Weight as of this encounter: 65.3 kg.  Patient doing well today.  Overall pain swelling and redness slowly improving.  Continue with IV antibiotics.    Recheck tomorrow incision/hand tomorrow  Pain controlled  T. Cranston Neighbor, PA-C [facility] 04/03/2023, 10:12 AM

## 2023-04-04 ENCOUNTER — Other Ambulatory Visit (HOSPITAL_COMMUNITY): Payer: Self-pay

## 2023-04-04 ENCOUNTER — Inpatient Hospital Stay: Payer: No Typology Code available for payment source | Admitting: Anesthesiology

## 2023-04-04 ENCOUNTER — Other Ambulatory Visit: Payer: Self-pay

## 2023-04-04 ENCOUNTER — Encounter: Payer: Self-pay | Admitting: Surgery

## 2023-04-04 ENCOUNTER — Encounter
Admission: EM | Disposition: A | Discharge status: Home / Self Care | Payer: Self-pay | Source: Home / Self Care | Attending: Internal Medicine

## 2023-04-04 DIAGNOSIS — L02511 Cutaneous abscess of right hand: Secondary | ICD-10-CM | POA: Diagnosis not present

## 2023-04-04 DIAGNOSIS — A419 Sepsis, unspecified organism: Secondary | ICD-10-CM | POA: Diagnosis not present

## 2023-04-04 DIAGNOSIS — L03113 Cellulitis of right upper limb: Secondary | ICD-10-CM | POA: Diagnosis not present

## 2023-04-04 DIAGNOSIS — A4902 Methicillin resistant Staphylococcus aureus infection, unspecified site: Secondary | ICD-10-CM | POA: Diagnosis not present

## 2023-04-04 DIAGNOSIS — E1129 Type 2 diabetes mellitus with other diabetic kidney complication: Secondary | ICD-10-CM | POA: Diagnosis not present

## 2023-04-04 HISTORY — PX: INCISION AND DRAINAGE OF WOUND: SHX1803

## 2023-04-04 LAB — GLUCOSE, CAPILLARY
Glucose-Capillary: 108 mg/dL — ABNORMAL HIGH (ref 70–99)
Glucose-Capillary: 113 mg/dL — ABNORMAL HIGH (ref 70–99)
Glucose-Capillary: 105 mg/dL — ABNORMAL HIGH (ref 70–99)
Glucose-Capillary: 141 mg/dL — ABNORMAL HIGH (ref 70–99)
Glucose-Capillary: 223 mg/dL — ABNORMAL HIGH (ref 70–99)

## 2023-04-04 SURGERY — IRRIGATION AND DEBRIDEMENT WOUND
Anesthesia: General | Site: Hand | Laterality: Right

## 2023-04-04 MED ORDER — LABETALOL HCL 5 MG/ML IV SOLN
INTRAVENOUS | Status: DC | PRN
Start: 2023-04-04 — End: 2023-04-04
  Administered 2023-04-04: 5 mg via INTRAVENOUS

## 2023-04-04 MED ORDER — DOCUSATE SODIUM 100 MG PO CAPS
100.0000 mg | ORAL_CAPSULE | Freq: Two times a day (BID) | ORAL | Status: DC
  Administered 2023-04-04 – 2023-04-07 (×6): 100 mg via ORAL
  Filled 2023-04-04 (×6): qty 1

## 2023-04-04 MED ORDER — DEXAMETHASONE SODIUM PHOSPHATE 10 MG/ML IJ SOLN
INTRAMUSCULAR | Status: DC | PRN
  Administered 2023-04-04: 5 mg via INTRAVENOUS

## 2023-04-04 MED ORDER — OXYCODONE HCL 5 MG PO TABS
ORAL_TABLET | ORAL | Status: AC
  Filled 2023-04-04: qty 1

## 2023-04-04 MED ORDER — PROPOFOL 10 MG/ML IV BOLUS
INTRAVENOUS | Status: DC | PRN
  Administered 2023-04-04: 150 mg via INTRAVENOUS

## 2023-04-04 MED ORDER — ONDANSETRON HCL 4 MG/2ML IJ SOLN: 4.0000 mg | Freq: Once | INTRAMUSCULAR | Status: DC | PRN

## 2023-04-04 MED ORDER — BISACODYL 10 MG RE SUPP: 10.0000 mg | Freq: Every day | RECTAL | Status: DC | PRN

## 2023-04-04 MED ORDER — OXYCODONE HCL 5 MG PO TABS
5.0000 mg | ORAL_TABLET | Freq: Once | ORAL | Status: AC | PRN
  Administered 2023-04-04: 5 mg via ORAL

## 2023-04-04 MED ORDER — ONDANSETRON HCL 4 MG PO TABS: 4.0000 mg | ORAL_TABLET | Freq: Four times a day (QID) | ORAL | Status: DC | PRN

## 2023-04-04 MED ORDER — PROPOFOL 10 MG/ML IV BOLUS
INTRAVENOUS | Status: AC
  Filled 2023-04-04: qty 20

## 2023-04-04 MED ORDER — METOCLOPRAMIDE HCL 10 MG PO TABS: 5.0000 mg | ORAL_TABLET | Freq: Three times a day (TID) | ORAL | Status: DC | PRN

## 2023-04-04 MED ORDER — FENTANYL CITRATE (PF) 100 MCG/2ML IJ SOLN
INTRAMUSCULAR | Status: DC | PRN
  Administered 2023-04-04 (×2): 25 ug via INTRAVENOUS
  Administered 2023-04-04: 50 ug via INTRAVENOUS

## 2023-04-04 MED ORDER — GLYCOPYRROLATE 0.2 MG/ML IJ SOLN
INTRAMUSCULAR | Status: DC | PRN
  Administered 2023-04-04: .2 mg via INTRAVENOUS

## 2023-04-04 MED ORDER — FENTANYL CITRATE (PF) 100 MCG/2ML IJ SOLN
INTRAMUSCULAR | Status: AC
  Filled 2023-04-04: qty 2

## 2023-04-04 MED ORDER — METOCLOPRAMIDE HCL 5 MG/ML IJ SOLN: 5.0000 mg | Freq: Three times a day (TID) | INTRAMUSCULAR | Status: DC | PRN

## 2023-04-04 MED ORDER — LIDOCAINE HCL (CARDIAC) PF 100 MG/5ML IV SOSY
PREFILLED_SYRINGE | INTRAVENOUS | Status: DC | PRN
  Administered 2023-04-04: 100 mg via INTRAVENOUS

## 2023-04-04 MED ORDER — GLYCOPYRROLATE 0.2 MG/ML IJ SOLN
INTRAMUSCULAR | Status: AC
  Filled 2023-04-04: qty 1

## 2023-04-04 MED ORDER — OXYCODONE HCL 5 MG/5ML PO SOLN: 5.0000 mg | Freq: Once | ORAL | Status: AC | PRN

## 2023-04-04 MED ORDER — MAGNESIUM HYDROXIDE 400 MG/5ML PO SUSP: 30.0000 mL | Freq: Every day | ORAL | Status: DC | PRN

## 2023-04-04 MED ORDER — DEXMEDETOMIDINE HCL IN NACL 80 MCG/20ML IV SOLN
INTRAVENOUS | Status: DC | PRN
  Administered 2023-04-04 (×2): 8 ug via INTRAVENOUS

## 2023-04-04 MED ORDER — ACETAMINOPHEN 10 MG/ML IV SOLN
INTRAVENOUS | Status: AC
  Filled 2023-04-04: qty 100

## 2023-04-04 MED ORDER — ONDANSETRON HCL 4 MG/2ML IJ SOLN
INTRAMUSCULAR | Status: AC
  Filled 2023-04-04: qty 2

## 2023-04-04 MED ORDER — FLEET ENEMA 7-19 GM/118ML RE ENEM: 1.0000 | ENEMA | Freq: Once | RECTAL | Status: DC | PRN

## 2023-04-04 MED ORDER — CEFAZOLIN SODIUM-DEXTROSE 2-4 GM/100ML-% IV SOLN
2.0000 g | Freq: Four times a day (QID) | INTRAVENOUS | Status: DC
  Administered 2023-04-04: 2 g via INTRAVENOUS
  Filled 2023-04-04: qty 100

## 2023-04-04 MED ORDER — MIDAZOLAM HCL 2 MG/2ML IJ SOLN
INTRAMUSCULAR | Status: DC | PRN
  Administered 2023-04-04: 2 mg via INTRAVENOUS

## 2023-04-04 MED ORDER — SEVOFLURANE IN SOLN
RESPIRATORY_TRACT | Status: AC
  Filled 2023-04-04: qty 250

## 2023-04-04 MED ORDER — DIPHENHYDRAMINE HCL 12.5 MG/5ML PO ELIX: 12.5000 mg | ORAL_SOLUTION | ORAL | Status: DC | PRN

## 2023-04-04 MED ORDER — 0.9 % SODIUM CHLORIDE (POUR BTL) OPTIME
TOPICAL | Status: DC | PRN
  Administered 2023-04-04: 1000 mL
  Administered 2023-04-04: 2000 mL

## 2023-04-04 MED ORDER — ONDANSETRON HCL 4 MG/2ML IJ SOLN
INTRAMUSCULAR | Status: DC | PRN
  Administered 2023-04-04: 4 mg via INTRAVENOUS

## 2023-04-04 MED ORDER — ENOXAPARIN SODIUM 40 MG/0.4ML IJ SOSY
40.0000 mg | PREFILLED_SYRINGE | INTRAMUSCULAR | Status: DC
  Administered 2023-04-05 – 2023-04-07 (×3): 40 mg via SUBCUTANEOUS
  Filled 2023-04-04 (×3): qty 0.4

## 2023-04-04 MED ORDER — MIDAZOLAM HCL 2 MG/2ML IJ SOLN
INTRAMUSCULAR | Status: AC
  Filled 2023-04-04: qty 2

## 2023-04-04 MED ORDER — LIDOCAINE HCL (PF) 2 % IJ SOLN
INTRAMUSCULAR | Status: AC
  Filled 2023-04-04: qty 5

## 2023-04-04 MED ORDER — DEXAMETHASONE SODIUM PHOSPHATE 10 MG/ML IJ SOLN
INTRAMUSCULAR | Status: AC
  Filled 2023-04-04: qty 1

## 2023-04-04 MED ORDER — ONDANSETRON HCL 4 MG/2ML IJ SOLN: 4.0000 mg | Freq: Four times a day (QID) | INTRAMUSCULAR | Status: DC | PRN

## 2023-04-04 MED ORDER — FENTANYL CITRATE (PF) 100 MCG/2ML IJ SOLN
25.0000 ug | INTRAMUSCULAR | Status: DC | PRN
  Administered 2023-04-04: 50 ug via INTRAVENOUS
  Administered 2023-04-04: 25 ug via INTRAVENOUS

## 2023-04-04 MED ORDER — ACETAMINOPHEN 10 MG/ML IV SOLN
INTRAVENOUS | Status: DC | PRN
  Administered 2023-04-04: 1000 mg via INTRAVENOUS

## 2023-04-04 SURGICAL SUPPLY — 49 items
APL PRP STRL LF DISP 70% ISPRP (MISCELLANEOUS) ×1
BLADE SURG SZ10 CARB STEEL (BLADE) ×1 IMPLANT
BNDG CMPR 5X4 CHSV STRCH STRL (GAUZE/BANDAGES/DRESSINGS) ×1
BNDG COHESIVE 4X5 TAN STRL LF (GAUZE/BANDAGES/DRESSINGS) ×1 IMPLANT
BNDG ESMARCH 4 X 12 STRL LF (GAUZE/BANDAGES/DRESSINGS)
BNDG ESMARCH 4X12 STRL LF (GAUZE/BANDAGES/DRESSINGS) ×1 IMPLANT
CHLORAPREP W/TINT 26 (MISCELLANEOUS) ×1 IMPLANT
CUFF TOURN SGL QUICK 18X4 (TOURNIQUET CUFF) IMPLANT
CUFF TOURN SGL QUICK 24 (TOURNIQUET CUFF)
CUFF TRNQT CYL 24X4X16.5-23 (TOURNIQUET CUFF) IMPLANT
DRAIN PENROSE 12X.25 LTX STRL (MISCELLANEOUS) IMPLANT
ELECT REM PT RETURN 9FT ADLT (ELECTROSURGICAL) ×1
ELECTRODE REM PT RTRN 9FT ADLT (ELECTROSURGICAL) ×1 IMPLANT
GAUZE 4X4 16PLY ~~LOC~~+RFID DBL (SPONGE) IMPLANT
GAUZE SPONGE 4X4 12PLY STRL (GAUZE/BANDAGES/DRESSINGS) IMPLANT
GAUZE XEROFORM 1X8 LF (GAUZE/BANDAGES/DRESSINGS) IMPLANT
GLOVE BIO SURGEON STRL SZ8 (GLOVE) ×1 IMPLANT
GLOVE INDICATOR 8.0 STRL GRN (GLOVE) ×1 IMPLANT
GLOVE SURG ORTHO 8.5 STRL (GLOVE) ×1 IMPLANT
GOWN STRL REUS W/ TWL LRG LVL3 (GOWN DISPOSABLE) ×1 IMPLANT
GOWN STRL REUS W/ TWL XL LVL3 (GOWN DISPOSABLE) ×1 IMPLANT
GOWN STRL REUS W/TWL LRG LVL3 (GOWN DISPOSABLE) ×1
GOWN STRL REUS W/TWL XL LVL3 (GOWN DISPOSABLE) ×1
KIT TURNOVER KIT A (KITS) ×1 IMPLANT
LABEL OR SOLS (LABEL) ×1 IMPLANT
MANIFOLD NEPTUNE II (INSTRUMENTS) ×1 IMPLANT
NDL SAFETY ECLIP 18X1.5 (MISCELLANEOUS) ×1 IMPLANT
NS IRRIG 1000ML POUR BTL (IV SOLUTION) ×1 IMPLANT
PACK EXTREMITY ARMC (MISCELLANEOUS) ×1 IMPLANT
PAD CAST 4YDX4 CTTN HI CHSV (CAST SUPPLIES) ×1 IMPLANT
PADDING CAST BLEND 4X4 STRL (MISCELLANEOUS) IMPLANT
PADDING CAST COTTON 4X4 STRL (CAST SUPPLIES)
SOL PREP PVP 2OZ (MISCELLANEOUS) ×1
SOLUTION PREP PVP 2OZ (MISCELLANEOUS) IMPLANT
SPLINT CAST 1 STEP 3X12 (MISCELLANEOUS) ×1 IMPLANT
SPONGE T-LAP 18X18 ~~LOC~~+RFID (SPONGE) ×1 IMPLANT
STAPLER SKIN PROX 35W (STAPLE) ×1 IMPLANT
STOCKINETTE BIAS CUT 4 980044 (GAUZE/BANDAGES/DRESSINGS) ×1 IMPLANT
STOCKINETTE IMPERVIOUS 9X36 MD (GAUZE/BANDAGES/DRESSINGS) ×1 IMPLANT
SUT ETHILON 4-0 (SUTURE) ×1
SUT ETHILON 4-0 FS2 18XMFL BLK (SUTURE) ×1
SUT PROLENE 4 0 PS 2 18 (SUTURE) IMPLANT
SUT VIC AB 4-0 SH 27 (SUTURE)
SUT VIC AB 4-0 SH 27XANBCTRL (SUTURE) ×1 IMPLANT
SUT VICRYL+ 3-0 36IN CT-1 (SUTURE) ×1 IMPLANT
SUTURE ETHLN 4-0 FS2 18XMF BLK (SUTURE) ×1 IMPLANT
SYR 10ML LL (SYRINGE) ×1 IMPLANT
TRAP FLUID SMOKE EVACUATOR (MISCELLANEOUS) ×1 IMPLANT
WATER STERILE IRR 500ML POUR (IV SOLUTION) ×1 IMPLANT

## 2023-04-04 NOTE — Transfer of Care (Signed)
+  Immediate Anesthesia Transfer of Care Note  Patient: Wanda Smith  Procedure(s) Performed: IRRIGATION AND DEBRIDEMENT RIGHT HAND DORSAL AND PALMAR INCISIONS WITH DELAYED PRIMARY CLOSURE (Right: Hand)  Patient Location: PACU  Anesthesia Type:General  Level of Consciousness: sedated  Airway & Oxygen Therapy: Patient Spontanous Breathing and Patient connected to face mask oxygen  Post-op Assessment: Report given to RN and Post -op Vital signs reviewed and stable  Post vital signs: Reviewed and stable  Last Vitals:  Vitals Value Taken Time  BP 178/104 04/04/23 1705  Temp    Pulse 59 04/04/23 1708  Resp 9 04/04/23 1708  SpO2 100 % 04/04/23 1708  Vitals shown include unfiled device data.  Last Pain:  Vitals:   04/04/23 1433  TempSrc: Temporal  PainSc: 7       Patients Stated Pain Goal: 0 (04/04/23 1433)  Complications: No notable events documented.

## 2023-04-04 NOTE — Progress Notes (Signed)
   Subjective: 3 Days Post-Op Procedure(s) (LRB): IRRIGATION AND DEBRIDEMENT EXTREMITY PREVENA PLACEMENT (Right) Reports the pain is improving to the right hand. Denies any N/V She has had a BM. No recent fevers. Some numbness in the pointer and long finger.  Objective: Vital signs in last 24 hours: Temp:  [97.8 F (36.6 C)-98.3 F (36.8 C)] 97.8 F (36.6 C) (07/29 0116) Pulse Rate:  [56-61] 56 (07/29 0116) Resp:  [14-15] 14 (07/29 0116) BP: (153-169)/(89-98) 153/98 (07/29 0116) SpO2:  [95 %-97 %] 97 % (07/29 0116)  Intake/Output from previous day: No intake/output data recorded. Intake/Output this shift: No intake/output data recorded.  Recent Labs    04/01/23 0911 04/02/23 0320 04/03/23 0504  HGB 10.2* 9.5* 9.6*   Recent Labs    04/02/23 0320 04/03/23 0504  WBC 9.2 6.2  RBC 3.16* 3.14*  HCT 29.0* 29.4*  PLT 259 249   Recent Labs    04/02/23 0320 04/03/23 0504  NA 137 140  K 3.1* 3.8  CL 107 111  CO2 22 22  BUN 8 6*  CREATININE 1.12* 1.03*  GLUCOSE 143* 126*  CALCIUM 7.5* 7.7*   No results for input(s): "LABPT", "INR" in the last 72 hours.  EXAM General - Patient is Alert, Appropriate, and Oriented Right upper extremity-right upper extremity with splint and Ace wrap intact with Prevena along the dorsum of the hand.  There is mild bloody drainage in the canister.  Patient has stable soft compartments throughout the forearm, mild swelling noted, improved from yesterday. Provena intact along dorsum of hand. Volar incision intact with pinrose drain present, PR drain removed this AM. No purulent drainage. Patient has sensation that is intact throughout the digits of the right hand.  New dressing was applied this AM.     Right hand photos from morning on 04/04/23  Past Medical History:  Diagnosis Date   Anemia    Anxiety    Arthritis    knee (R)   Asthma    Chronic kidney disease    Stage 3 sees Lateef   COPD (chronic obstructive pulmonary disease)  (HCC)    Depression    Diabetes mellitus without complication (HCC)    Dyspnea    GERD (gastroesophageal reflux disease)    occ   Hypertension    Hypothyroidism    Increased serum lipids    Wears contact lenses     Assessment/Plan:   3 Days Post-Op Procedure(s) (LRB): IRRIGATION AND DEBRIDEMENT EXTREMITY PREVENA PLACEMENT (Right) Principal Problem:   Cellulitis of right hand Active Problems:   Hypothyroidism   Hypertension   COPD (chronic obstructive pulmonary disease) (HCC)   Depression with anxiety   HLD (hyperlipidemia)   Type II diabetes mellitus with renal manifestations (HCC)   Chronic kidney disease, stage 3a (HCC)   Abnormal LFTs   Severe sepsis (HCC)   Abscess of right hand  Estimated body mass index is 24.72 kg/m as calculated from the following:   Height as of this encounter: 5\' 4"  (1.626 m).   Weight as of this encounter: 65.3 kg.  Patient doing well this morning, denies severe pain. Penrose drain removed this morning.  Prevena intact to dorsum of the hand. Plan for return to the OR today for woundvac removal, repeat Irrigation and debridement and closure of the skin.  NPO this AM. Initial culture grew MRSA.  Currently on Unasyn and Zyvox.  Valeria Batman, PA-C [facility] 04/04/2023, 7:51 AM

## 2023-04-04 NOTE — Progress Notes (Signed)
Date of Admission:  03/30/2023     ID: ALYDA NIER is a 62 y.o. female  Principal Problem:   Cellulitis of right hand Active Problems:   Hypothyroidism   Hypertension   COPD (chronic obstructive pulmonary disease) (HCC)   Depression with anxiety   HLD (hyperlipidemia)   Type II diabetes mellitus with renal manifestations (HCC)   Chronic kidney disease, stage 3a (HCC)   Abnormal LFTs   Severe sepsis (HCC)   Abscess of right hand    Subjective: Pt is doing better Says rt hand is less painful Less swelling of arm Some itching over rt arm and under neck  Medications:   citalopram  40 mg Oral QPM   docusate sodium  100 mg Oral BID   enoxaparin (LOVENOX) injection  40 mg Subcutaneous Q24H   ferrous sulfate  325 mg Oral Q breakfast   insulin aspart  0-15 Units Subcutaneous TID WC   insulin aspart  0-5 Units Subcutaneous QHS   levothyroxine  88 mcg Oral QAC breakfast   linezolid  600 mg Oral Q12H   loratadine  10 mg Oral QPM   mometasone-formoterol  2 puff Inhalation BID   montelukast  10 mg Oral QHS   rosuvastatin  10 mg Oral Daily    Objective: Vital signs in last 24 hours: Patient Vitals for the past 24 hrs:  BP Temp Pulse Resp SpO2  04/04/23 0855 (!) 159/85 97.7 F (36.5 C) (!) 51 15 98 %  04/04/23 0116 (!) 153/98 97.8 F (36.6 C) (!) 56 14 97 %  04/03/23 1651 (!) 169/89 98.3 F (36.8 C) 61 15 95 %      PHYSICAL EXAM:  General: Alert, cooperative, no distress,  Lungs: Clear to auscultation bilaterally. No Wheezing or Rhonchi. No rales. Heart: Regular rate and rhythm, no murmur, rub or gallop. Abdomen: Soft, non-tender,not distended. Bowel sounds normal. No masses Extremities: rt arm dressing not removed Pictures reviewed     Skin: some erythema over rt arm Lymph: Cervical, supraclavicular normal. Neurologic: Grossly non-focal  Lab Results    Latest Ref Rng & Units 04/03/2023    5:04 AM 04/02/2023    3:20 AM 04/01/2023    9:11 AM  CBC  WBC  4.0 - 10.5 K/uL 6.2  9.2  10.3   Hemoglobin 12.0 - 15.0 g/dL 9.6  9.5  16.1   Hematocrit 36.0 - 46.0 % 29.4  29.0  32.0   Platelets 150 - 400 K/uL 249  259  261        Latest Ref Rng & Units 04/03/2023    5:04 AM 04/02/2023    3:20 AM 04/01/2023    9:11 AM  CMP  Glucose 70 - 99 mg/dL 096  045    BUN 8 - 23 mg/dL 6  8    Creatinine 4.09 - 1.00 mg/dL 8.11  9.14  7.82   Sodium 135 - 145 mmol/L 140  137    Potassium 3.5 - 5.1 mmol/L 3.8  3.1    Chloride 98 - 111 mmol/L 111  107    CO2 22 - 32 mmol/L 22  22    Calcium 8.9 - 10.3 mg/dL 7.7  7.5        Microbiology: 03/30/23 X 2 Wound culture MRSA 04/01/23 Dorsum abscess culture- MRSA    Assessment/Plan: MRSA infection of the right hand Abscess on the palmar aspect as well as the dorsal aspect of the hand Status post I&D x 2 Patient is  currently on linezolid and Unasyn Will discontinue Unasyn She is doing better She is going back to the OR for wound VAC change today  Diabetes mellitus currently on  insulin  CKD  Hypertension  Hyperlipidemia  Hypothyroidism on Synthroid Discussed the management with the patient and her husband at bedside.

## 2023-04-04 NOTE — Op Note (Signed)
04/04/2023  5:06 PM  Patient:   Wanda Smith  Pre-Op Diagnosis:   Dorsal and palmar abscesses right hand.  Post-Op Diagnosis:   Same  Procedure:   Irrigation and debridement with delayed primary closure of right dorsal and palmar hand abscesses.  Surgeon:   Maryagnes Amos, MD  Assistant:   None  Anesthesia:   General LMA  Findings:   As above.  Complications:   None  Fluids:   400 cc crystalloid  EBL:   20 cc  UOP:   None  TT:   60 minutes at 250 mmHg  Drains:   None  Closure:   4-0 Prolene interrupted sutures  Implants:   Penrose x 2  Brief Clinical Note:   The patient is a 62 year old female who is now status post I&D x 2 of palmar and dorsal right hand abscesses, the most recent procedure was done 3 days ago.  The dorsal hand wound was left open and a Prevena placed over the wound.  The patient presents at this time for repeat I&D of both the dorsal and palmar wounds with delayed primary closure of the dorsal wound.  Procedure:   The patient was brought into the operating room and laid in the supine position.  After adequate general laryngeal mask anesthesia was obtained, the patient's right hand and upper extremity was prepped with a Betadine scrub/prep solution before being draped sterilely.  Peri-operative antibiotics were continued.  A timeout was performed to verify the appropriate surgical site.  The limb was held in an elevated position for several minutes before the tourniquet was inflated to 250 mmHg.  The palmar incision was approached first.  The sutures were removed and the wound reopened.  The tissues looked good with no obvious purulence and only several small areas of questionable looking fibrinous material which was debrided.  The wound was copiously irrigated with 1 L of sterile saline solution using bulb irrigation.  Next, the dorsal incision was addressed.  A few stitches placed at the distal end were removed before this wound to was irrigated  thoroughly with 1.5 L of sterile saline solution using bulb irrigation.  Several areas of questionable fibrinous material was identified and debrided using a rongeur and a sponge prior to irrigating the wound.    The dorsal and palmar incisions both were closed using 4-0 Prolene interrupted sutures before Penrose drains were placed at the proximal ends of both wounds.  A sterile bulky dressing was then applied over the dorsal and palmar wounds before placing a volar fiberglass splint to maintain the hand and wrist in the position of function.  The patient was then awakened, extubated, and returned to the recovery room in satisfactory condition after tolerating the procedure well.

## 2023-04-04 NOTE — Anesthesia Procedure Notes (Signed)
Procedure Name: Intubation Date/Time: 04/04/2023 3:27 PM  Performed by: Elisabeth Pigeon, CRNAPre-anesthesia Checklist: Patient identified, Emergency Drugs available, Suction available and Patient being monitored Patient Re-evaluated:Patient Re-evaluated prior to induction Oxygen Delivery Method: Circle system utilized Preoxygenation: Pre-oxygenation with 100% oxygen Induction Type: IV induction Ventilation: Mask ventilation without difficulty LMA: LMA inserted LMA Size: 3.0 Number of attempts: 1 Placement Confirmation: breath sounds checked- equal and bilateral and positive ETCO2 Tube secured with: Tape Dental Injury: Teeth and Oropharynx as per pre-operative assessment

## 2023-04-04 NOTE — Anesthesia Preprocedure Evaluation (Signed)
Anesthesia Evaluation  Patient identified by MRN, date of birth, ID band Patient awake    Reviewed: Allergy & Precautions, NPO status , Patient's Chart, lab work & pertinent test results  History of Anesthesia Complications Negative for: history of anesthetic complications  Airway Mallampati: III  TM Distance: >3 FB Neck ROM: Full    Dental  (+) Dental Advidsory Given, Teeth Intact, Partial Upper Upper partial; lower molars missing:   Pulmonary neg shortness of breath, asthma , neg sleep apnea, COPD,  COPD inhaler, neg recent URI, Patient abstained from smoking.Not current smoker, former smoker   Pulmonary exam normal breath sounds clear to auscultation       Cardiovascular Exercise Tolerance: Good METShypertension, (-) angina (-) CAD, (-) Past MI and (-) Cardiac Stents Normal cardiovascular exam(-) dysrhythmias (-) Valvular Problems/Murmurs Rhythm:Regular Rate:Normal - Systolic murmurs ECG 10/15/21: normal   Neuro/Psych  PSYCHIATRIC DISORDERS Anxiety Depression    negative neurological ROS     GI/Hepatic ,GERD  Controlled,,(+)     (-) substance abuse    Endo/Other  diabetes, Type 2Hypothyroidism    Renal/GU CRFRenal disease (stage III CKD)     Musculoskeletal  (+) Arthritis ,    Abdominal   Peds  Hematology negative hematology ROS (+)   Anesthesia Other Findings Past Medical History: No date: Anemia No date: Anxiety No date: Arthritis     Comment:  knee (R) No date: Asthma No date: Chronic kidney disease     Comment:  Stage 3 sees Lateef No date: COPD (chronic obstructive pulmonary disease) (HCC) No date: Depression No date: Diabetes mellitus without complication (HCC) No date: Dyspnea No date: GERD (gastroesophageal reflux disease)     Comment:  occ No date: Hypertension No date: Hypothyroidism No date: Increased serum lipids No date: Wears contact lenses   Reproductive/Obstetrics                              Anesthesia Physical Anesthesia Plan  ASA: 3  Anesthesia Plan: General   Post-op Pain Management: Ofirmev IV (intra-op)*   Induction: Intravenous  PONV Risk Score and Plan: 3 and Ondansetron, Dexamethasone and Midazolam  Airway Management Planned: LMA  Additional Equipment: None  Intra-op Plan:   Post-operative Plan: Extubation in OR  Informed Consent: I have reviewed the patients History and Physical, chart, labs and discussed the procedure including the risks, benefits and alternatives for the proposed anesthesia with the patient or authorized representative who has indicated his/her understanding and acceptance.     Dental advisory given  Plan Discussed with: CRNA and Surgeon  Anesthesia Plan Comments: (Discussed risks of anesthesia with patient, including PONV, sore throat, lip/dental/eye damage. Rare risks discussed as well, such as cardiorespiratory and neurological sequelae, and allergic reactions. Discussed the role of CRNA in patient's perioperative care. Patient understands. NPO appropriate)        Anesthesia Quick Evaluation

## 2023-04-04 NOTE — Anesthesia Procedure Notes (Signed)
Procedure Name: LMA Insertion Date/Time: 04/05/2023 3:27 PM  Performed by: Elisabeth Pigeon, CRNAPre-anesthesia Checklist: Patient identified, Emergency Drugs available, Suction available, Patient being monitored and Timeout performed Patient Re-evaluated:Patient Re-evaluated prior to induction Oxygen Delivery Method: Circle system utilized Preoxygenation: Pre-oxygenation with 100% oxygen Induction Type: IV induction Ventilation: Mask ventilation without difficulty LMA: LMA inserted LMA Size: 3.0 Number of attempts: 1 Tube secured with: Tape Dental Injury: Teeth and Oropharynx as per pre-operative assessment

## 2023-04-04 NOTE — Progress Notes (Signed)
[facility]  - [place] at [facility]   PATIENT NAME: Wanda Smith    MR#:  [id number]  DATE OF BIRTH:  05/23/1961  SUBJECTIVE:  Husband at bedside.  Right hand swelling improving. Drain+ Itching all over thinks form dilaudid Tolerating po diet  VITALS:  Blood pressure (!) 159/85, pulse (!) 51, temperature 97.7 F (36.5 C), resp. rate 15, height 5\' 4"  (1.626 m), weight 65.3 kg, SpO2 98%.  PHYSICAL EXAMINATION:   GENERAL:  62 y.o.-year-old patient with no acute distress.  LUNGS: Normal breath sounds bilaterally, no wheezing CARDIOVASCULAR: S1, S2 normal. No murmur   ABDOMEN: Soft, nontender, nondistended. Bowel sounds present.  EXTREMITIES: No  edema b/l.    Right hand POA 03/30/23   04/01/23    04/03/2023   04/04/23    LABORATORY PANEL:  CBC Recent Labs  Lab 04/03/23 0504  WBC 6.2  HGB 9.6*  HCT 29.4*  PLT 249    Chemistries  Recent Labs  Lab 03/30/23 0032 03/31/23 0141 04/03/23 0504  NA 134*   < > 140  K 3.7   < > 3.8  CL 103   < > 111  CO2 25   < > 22  GLUCOSE 128*   < > 126*  BUN 32*   < > 6*  CREATININE 1.29*   < > 1.03*  CALCIUM 9.2   < > 7.7*  AST 59*  --   --   ALT 99*  --   --   ALKPHOS 103  --   --   BILITOT 0.4  --   --    < > = values in this interval not displayed.   Assessment and Plan   TANYIA HYRE is a 62 y.o. female with medical history significant of HTN, HLD, DM, COPD, hypothyroidism, depression with anxiety, CKD-3A, anemia, who presents with right hand pain, redness and swelling. Pt had a scratch on the right had about a week ago from her dog.  CT right hand--1. Marked soft tissue edema about the second digit and dorsum of the hand suggesting cellulitis. 2. There is a peripherally enhancing hypodense area between the second and third metacarpals soft tissues measuring 0.9 x 0.3 x 4.0 cm, suspicious for developing abscess. Clinical correlation and continued follow-up examination is suggested. 3. No  evidence of fracture or osteonecrosis   Severe sepsis due to cellulitis/abscess of right hand: -- Ct showed possible small abscess formation.  Patient has severe sepsis with WBC 16.1, fever of 101.3, RR up to 22.  Lactic acid 1.2 --> 1.6 --> 2.4.  Procalcitonin 0.16.  Patient developed hypotension with blood pressure 88/61, which has responded to IV fluid resuscitation, and treatment with albumin, midodrine and Solu-Cortef.  -- Consulted Dr. Joice Lofts of ortho. Pt is s/p  Irrigation and debridement of right hand abscess with I&D of right index flexor sheath.  --BP much improved -- Empiric antimicrobial treatment with vancomycin and cefazolin (peri-op) - PRN Zofran for nausea, morphine and oxycodone for pain - Blood cultures x 2 --negative - ESR 39  and CRP 3.2 -  trended lactic acid levels down --d/c IVF --WC (deep) rare GPC--staph aureurs --7/26-- repeat CT scan shows pulse collection between second and third metacarpal joint. Patient NPO. Dr Joice Lofts to take to OR for washout. --ID consult with Dr JR--recommends Linezolid and IV unasyn --7/27--pt overall feeling better. Drain + pain 5/10 --7/28-- itching all over the face neck likely due to Dilaudid. Patient agreeable to take ibuprofen with  meals and PRN [name] if needed --7/29--pt going to OR for another wash out today per Ortho. No fever. Swelling improving    Abnormal LFTs: mild.  Likely due to sepsis, Tylenol level less than 10.   Type II diabetes mellitus with renal manifestations [facility]): Recent A1c 6.3, well-controlled.  Patient is taking metformin and fark scar -Sliding scale insulin   Hypothyroidism -Synthroid   Hypertension -Hold lisinopril due to severe sepsis   HLD (hyperlipidemia) -Crestor   COPD (chronic obstructive pulmonary disease) (HCC): Stable -As needed bronchodilators   Chronic kidney disease, stage 3a (HCC): Stable. --follows with Dr Cherylann Ratel   Depression with anxiety -Continue home medications       DVT ppx:  SCD  Code Status: Full code   Family Communication: husband at bedside Procedures: Irrigation and debridement of right hand abscess with I&D of right index flexor sheath. X2  Level of care: Med-Surg Status is: Inpatient Remains inpatient appropriate because: sepsis due to right hand abscess    TOTAL TIME TAKING CARE OF THIS PATIENT: 35 minutes.  >50% time spent on counselling and coordination of care  Note: This dictation was prepared with Dragon dictation along with smaller phrase technology. Any transcriptional errors that result from this process are unintentional.  Enedina Finner M.D    [facility]   CC: Primary care physician; Patrice Paradise, MD

## 2023-04-04 NOTE — TOC Benefit Eligibility Note (Signed)
Pharmacy Patient Advocate Encounter  Insurance verification completed.    The patient is insured through [employer] test claim for Linezolid and the current 30 day co-pay is $10.00.   This test claim was processed through [facility]- copay amounts may vary at other pharmacies due to pharmacy/plan contracts, or as the patient moves through the different stages of their insurance plan.

## 2023-04-05 ENCOUNTER — Encounter: Payer: Self-pay | Admitting: Surgery

## 2023-04-05 DIAGNOSIS — A4902 Methicillin resistant Staphylococcus aureus infection, unspecified site: Secondary | ICD-10-CM

## 2023-04-05 DIAGNOSIS — L02511 Cutaneous abscess of right hand: Secondary | ICD-10-CM | POA: Diagnosis not present

## 2023-04-05 LAB — GLUCOSE, CAPILLARY
Glucose-Capillary: 116 mg/dL — ABNORMAL HIGH (ref 70–99)
Glucose-Capillary: 120 mg/dL — ABNORMAL HIGH (ref 70–99)
Glucose-Capillary: 130 mg/dL — ABNORMAL HIGH (ref 70–99)
Glucose-Capillary: 113 mg/dL — ABNORMAL HIGH (ref 70–99)

## 2023-04-05 MED ORDER — IBUPROFEN 400 MG PO TABS
400.0000 mg | ORAL_TABLET | Freq: Four times a day (QID) | ORAL | Status: DC | PRN
  Administered 2023-04-05 – 2023-04-07 (×3): 400 mg via ORAL
  Filled 2023-04-05 (×3): qty 1

## 2023-04-05 MED ORDER — OXYCODONE HCL 5 MG PO TABS
5.0000 mg | ORAL_TABLET | Freq: Four times a day (QID) | ORAL | Status: DC | PRN
  Administered 2023-04-05 – 2023-04-07 (×6): 10 mg via ORAL
  Filled 2023-04-05 (×6): qty 2

## 2023-04-05 MED ORDER — ACETAMINOPHEN 500 MG PO TABS
1000.0000 mg | ORAL_TABLET | Freq: Four times a day (QID) | ORAL | Status: DC | PRN
  Administered 2023-04-05 – 2023-04-06 (×3): 1000 mg via ORAL
  Filled 2023-04-05 (×4): qty 2

## 2023-04-05 MED ORDER — CEFAZOLIN SODIUM-DEXTROSE 2-4 GM/100ML-% IV SOLN
2.0000 g | Freq: Four times a day (QID) | INTRAVENOUS | Status: AC
  Administered 2023-04-05: 2 g via INTRAVENOUS
  Filled 2023-04-05: qty 100

## 2023-04-05 NOTE — Progress Notes (Signed)
Subjective: 1 Day Post-Op Procedure(s) (LRB): IRRIGATION AND DEBRIDEMENT RIGHT HAND DORSAL AND PALMAR INCISIONS WITH DELAYED PRIMARY CLOSURE (Right) Underwent third procedure on hand yesterday. Able to close dorsal hand incision and palmar hand incision, drains placed. Reports pain continue to improve to the hand this morning. Reports some burning in the index and long fingers but denies any numbness. No recent fevers.  Objective: Vital signs in last 24 hours: Temp:  [97.3 F (36.3 C)-98.6 F (37 C)] 98.2 F (36.8 C) (07/30 0347) Pulse Rate:  [51-65] 62 (07/30 0347) Resp:  [9-20] 20 (07/30 0347) BP: (123-192)/(79-107) 123/83 (07/30 0347) SpO2:  [90 %-100 %] 94 % (07/30 0347) Weight:  [65.3 kg] 65.3 kg (07/29 1433)  Intake/Output from previous day: 07/29 0701 - 07/30 0700 In: 840 [P.O.:240; I.V.:500; IV Piggyback:100] Out: 20 [Blood:20] Intake/Output this shift: No intake/output data recorded.  Recent Labs    04/03/23 0504 04/05/23 0339  HGB 9.6* 9.1*   Recent Labs    04/03/23 0504 04/05/23 0339  WBC 6.2 6.6  RBC 3.14* 3.07*  HCT 29.4* 28.3*  PLT 249 322   Recent Labs    04/03/23 0504 04/05/23 0339  NA 140 139  K 3.8 3.8  CL 111 108  CO2 22 24  BUN 6* 15  CREATININE 1.03* 1.21*  GLUCOSE 126* 193*  CALCIUM 7.7* 7.7*   No results for input(s): "LABPT", "INR" in the last 72 hours.  EXAM General - Patient is Alert, Appropriate, and Oriented Right upper extremity-right upper extremity with splint and Ace wrap intact.  She is able to flex and extend DIP joints that are exposed from the splint.  Intact to light touch.  Intact cap refill.  Swelling noted to fingers but improving.   Past Medical History:  Diagnosis Date   Anemia    Anxiety    Arthritis    knee (R)   Asthma    Chronic kidney disease    Stage 3 sees Lateef   COPD (chronic obstructive pulmonary disease) (HCC)    Depression    Diabetes mellitus without complication (HCC)    Dyspnea     GERD (gastroesophageal reflux disease)    occ   Hypertension    Hypothyroidism    Increased serum lipids    Wears contact lenses     Assessment/Plan:   1 Day Post-Op Procedure(s) (LRB): IRRIGATION AND DEBRIDEMENT RIGHT HAND DORSAL AND PALMAR INCISIONS WITH DELAYED PRIMARY CLOSURE (Right) Principal Problem:   Cellulitis of right hand Active Problems:   Hypothyroidism   Hypertension   COPD (chronic obstructive pulmonary disease) (HCC)   Depression with anxiety   HLD (hyperlipidemia)   Type II diabetes mellitus with renal manifestations (HCC)   Chronic kidney disease, stage 3a (HCC)   Abnormal LFTs   Severe sepsis (HCC)   Abscess of right hand   MRSA infection  Estimated body mass index is 24.72 kg/m as calculated from the following:   Height as of this encounter: 5\' 4"  (1.626 m).   Weight as of this encounter: 65.3 kg.  Patient doing well this morning, denies severe pain.  Incrased tylenol dosage between oxycodone. Splint intact this morning.  Will plan on advancing drains tomorrow AM and removing on Thursday morning. Continue IV Ancef and Zyvox.  Initial culture grew MRSA. Possible discharge home after drain removal on Thursday.  Valeria Batman, PA-C [facility] 04/05/2023, 7:40 AM

## 2023-04-05 NOTE — Progress Notes (Signed)
[facility]  - [place] at [facility]   PATIENT NAME: Wanda Smith    MR#:  [id number]  DATE OF BIRTH:  Apr 29, 1961  SUBJECTIVE:  Husband at bedside.  Right hand swelling improving. Drain+ Itching all over thinks form dilaudid Tolerating po diet  VITALS:  Blood pressure 124/73, pulse 62, temperature 98.4 F (36.9 C), resp. rate 15, height 5\' 4"  (1.626 m), weight 65.3 kg, SpO2 100%.  PHYSICAL EXAMINATION:   GENERAL:  62 y.o.-year-old patient with no acute distress.  LUNGS: Normal breath sounds bilaterally, no wheezing CARDIOVASCULAR: S1, S2 normal. No murmur   ABDOMEN: Soft, nontender, nondistended. Bowel sounds present.  EXTREMITIES: No  edema b/l.    Right hand POA 03/30/23   04/01/23    04/03/2023   04/04/23    LABORATORY PANEL:  CBC Recent Labs  Lab 04/05/23 0339  WBC 6.6  HGB 9.1*  HCT 28.3*  PLT 322    Chemistries  Recent Labs  Lab 03/30/23 0032 03/31/23 0141 04/05/23 0339  NA 134*   < > 139  K 3.7   < > 3.8  CL 103   < > 108  CO2 25   < > 24  GLUCOSE 128*   < > 193*  BUN 32*   < > 15  CREATININE 1.29*   < > 1.21*  CALCIUM 9.2   < > 7.7*  AST 59*  --   --   ALT 99*  --   --   ALKPHOS 103  --   --   BILITOT 0.4  --   --    < > = values in this interval not displayed.   Assessment and Plan   Wanda Smith is a 62 y.o. female with medical history significant of HTN, HLD, DM, COPD, hypothyroidism, depression with anxiety, CKD-3A, anemia, who presents with right hand pain, redness and swelling. Pt had a scratch on the right had about a week ago from her dog.  CT right hand--1. Marked soft tissue edema about the second digit and dorsum of the hand suggesting cellulitis. 2. There is a peripherally enhancing hypodense area between the second and third metacarpals soft tissues measuring 0.9 x 0.3 x 4.0 cm, suspicious for developing abscess. Clinical correlation and continued follow-up examination is suggested. 3. No  evidence of fracture or osteonecrosis   Severe sepsis due to cellulitis/abscess of right hand/palm MRSA abscess -- Ct showed possible small abscess formation.  Patient has severe sepsis with WBC 16.1, fever of 101.3, RR up to 22.  Lactic acid 1.2 --> 1.6 --> 2.4.  Procalcitonin 0.16.  Patient developed hypotension with blood pressure 88/61, which has responded to IV fluid resuscitation, and treatment with albumin, midodrine and Solu-Cortef.  -- Consulted Dr. Joice Lofts of ortho. Pt is s/p  Irrigation and debridement of right hand abscess with I&D of right index flexor sheath.  --BP much improved -- Empiric antimicrobial treatment with vancomycin and cefazolin (peri-op) - PRN Zofran for nausea, morphine and oxycodone for pain - Blood cultures x 2 --negative - ESR 39  and CRP 3.2 -  trended lactic acid levels down --d/c IVF --WC (deep) rare GPC--staph aureus, MRSA --7/26-- repeat CT scan shows pulse collection between second and third metacarpal joint. Patient NPO. Dr Joice Lofts to take to OR for washout. --ID consult with Dr JR--recommends Linezolid and IV unasyn --7/27--pt overall feeling better. Drain + pain 5/10 --7/28-- itching all over the face neck likely due to Dilaudid. Patient agreeable to take ibuprofen  with meals and PRN [name] if needed --7/29--pt going to OR for another wash out today per Ortho. No fever. Swelling improving --7/30--doing well s/p  Irrigation and debridement with delayed primary closure of right dorsal and palmar hand abscesses. Cont prn pain meds. Plan to d/c thurs once drain is removed. Cont PO linezolid    Abnormal LFTs: mild.  Likely due to sepsis, Tylenol level less than 10.   Type II diabetes mellitus with renal manifestations [facility]): Recent A1c 6.3, well-controlled.  Patient is taking metformin and farxiga at home -Sliding scale insulin --sugars well controlled with SSI   Hypothyroidism -Synthroid   Hypertension -Hold lisinopril due to stable bp. Can resume at  discharge   HLD (hyperlipidemia) -Crestor   COPD (chronic obstructive pulmonary disease) (HCC): Stable -As needed bronchodilators   Chronic kidney disease, stage 3a (HCC): Stable. --follows with Dr Cherylann Ratel   Depression with anxiety -Continue home medications       DVT ppx: SCD Code Status: Full code  Family Communication: husband at bedside Procedures: Irrigation and debridement of right hand abscess with I&D of right index flexor sheath. X2  Level of care: Med-Surg Status is: Inpatient Remains inpatient appropriate because: sepsis due to right hand abscess    TOTAL TIME TAKING CARE OF THIS PATIENT: 35 minutes.  >50% time spent on counselling and coordination of care  Note: This dictation was prepared with Dragon dictation along with smaller phrase technology. Any transcriptional errors that result from this process are unintentional.  Enedina Finner M.D    [facility]   CC: Primary care physician; Patrice Paradise, MD

## 2023-04-05 NOTE — TOC CM/SW Note (Signed)
Transition of Care [facility]) - Inpatient Brief Assessment   Patient Details  Name: Wanda Smith MRN: [id number] Date of Birth: 07/22/1961  [facility] CM/SW Contact:    Garret Reddish, RN Phone Number: 04/05/2023, 9:26 AM   Clinical Narrative:   Chart reviewed.  Noted that patient was admitted with Right hand abscess status post dog scratch.  Patient is s/p irrigation and debridement of right hand abscess from palmar and dorsal approaches.  Patient is on IV Ancef and Zyvox. Initial culture grew MRSA.  Patient has drains to wound site.  Possible dc on Thursday.    Noted no PT follow up is needed.  No needs identified at this time.    TOC will continue to follow.       Transition of Care Asessment: Insurance and Status: Insurance coverage has been reviewed Patient has primary care physician: Yes Merlinda Frederick, Merleen Milliner is PCP) Home environment has been reviewed: Lives at home with spouse Prior level of function:: Independent prior to admission Prior/Current Home Services: No current home services Social Determinants of Health Reivew: SDOH reviewed no interventions necessary Readmission risk has been reviewed: Yes Transition of care needs: no transition of care needs at this time

## 2023-04-05 NOTE — Progress Notes (Signed)
Date of Admission:  03/30/2023     ID: Wanda Smith is a 62 y.o. female  Principal Problem:   Cellulitis of right hand Active Problems:   Hypothyroidism   Hypertension   COPD (chronic obstructive pulmonary disease) (HCC)   Depression with anxiety   HLD (hyperlipidemia)   Type II diabetes mellitus with renal manifestations (HCC)   Chronic kidney disease, stage 3a (HCC)   Abnormal LFTs   Severe sepsis (HCC)   Abscess of right hand   MRSA infection    Subjective: Pt is doing much better Swelling much improved  Medications:   citalopram  40 mg Oral QPM   docusate sodium  100 mg Oral BID   enoxaparin (LOVENOX) injection  40 mg Subcutaneous Q24H   ferrous sulfate  325 mg Oral Q breakfast   insulin aspart  0-15 Units Subcutaneous TID WC   insulin aspart  0-5 Units Subcutaneous QHS   levothyroxine  88 mcg Oral QAC breakfast   linezolid  600 mg Oral Q12H   loratadine  10 mg Oral QPM   mometasone-formoterol  2 puff Inhalation BID   montelukast  10 mg Oral QHS   rosuvastatin  10 mg Oral Daily    Objective: Vital signs in last 24 hours: Patient Vitals for the past 24 hrs:  BP Temp Temp src Pulse Resp SpO2  04/05/23 1517 136/86 97.9 F (36.6 C) -- 66 18 97 %  04/05/23 0805 124/73 98.4 F (36.9 C) -- 62 15 100 %  04/05/23 0347 123/83 98.2 F (36.8 C) Oral 62 20 94 %  04/04/23 2348 133/79 98.2 F (36.8 C) Oral (!) 56 16 95 %  04/04/23 2033 138/84 98.6 F (37 C) Oral 62 16 97 %  04/04/23 1851 (!) 178/103 -- -- (!) 53 -- --  04/04/23 1831 (!) 192/107 97.8 F (36.6 C) -- (!) 55 -- 95 %  04/04/23 1816 (!) 168/92 -- -- (!) 54 (!) 9 90 %  04/04/23 1800 (!) 164/98 97.7 F (36.5 C) -- (!) 53 (!) 9 93 %  04/04/23 1747 (!) 181/102 -- -- (!) 57 11 94 %  04/04/23 1730 (!) 184/104 -- -- 61 15 94 %  04/04/23 1724 (!) 175/97 -- -- 65 16 100 %  04/04/23 1715 (!) 179/106 -- -- 65 11 100 %  04/04/23 1705 (!) 178/104 -- -- (!) 57 11 100 %  04/04/23 1703 (!) 175/105 -- -- 61 12  99 %  04/04/23 1700 (!) 184/105 (!) 97.3 F (36.3 C) -- 61 12 100 %      PHYSICAL EXAM:  General: Alert, cooperative, no distress,  Lungs: Clear to auscultation bilaterally. No Wheezing or Rhonchi. No rales. Heart: Regular rate and rhythm, no murmur, rub or gallop. Abdomen: Soft, non-tender,not distended. Bowel sounds normal. No masses Extremities: rt arm dressing not removed Pictures reviewed     Skin: some erythema over rt arm resolved Lymph: Cervical, supraclavicular normal. Neurologic: Grossly non-focal  Lab Results    Latest Ref Rng & Units 04/05/2023    3:39 AM 04/03/2023    5:04 AM 04/02/2023    3:20 AM  CBC  WBC 4.0 - 10.5 K/uL 6.6  6.2  9.2   Hemoglobin 12.0 - 15.0 g/dL 9.1  9.6  9.5   Hematocrit 36.0 - 46.0 % 28.3  29.4  29.0   Platelets 150 - 400 K/uL 322  249  259        Latest Ref Rng & Units 04/05/2023  3:39 AM 04/03/2023    5:04 AM 04/02/2023    3:20 AM  CMP  Glucose 70 - 99 mg/dL 098  119  147   BUN 8 - 23 mg/dL 15  6  8    Creatinine 0.44 - 1.00 mg/dL 8.29  5.62  1.30   Sodium 135 - 145 mmol/L 139  140  137   Potassium 3.5 - 5.1 mmol/L 3.8  3.8  3.1   Chloride 98 - 111 mmol/L 108  111  107   CO2 22 - 32 mmol/L 24  22  22    Calcium 8.9 - 10.3 mg/dL 7.7  7.7  7.5       Microbiology: 03/30/23 X 2 Wound culture MRSA 04/01/23 Dorsum abscess culture- MRSA    Assessment/Plan: MRSA infection of the right hand Abscess on the palmar aspect as well as the dorsal aspect of the hand Status post I&D x 2 Patient  on linezolid will need 2-3 weeks of linezolid She is doing better   Diabetes mellitus currently on  insulin  CKD  Hypertension  Hyperlipidemia  Hypothyroidism on Synthroid Discussed the management with the patient and daughter in the room

## 2023-04-05 NOTE — Plan of Care (Signed)

## 2023-04-06 DIAGNOSIS — A4902 Methicillin resistant Staphylococcus aureus infection, unspecified site: Secondary | ICD-10-CM | POA: Diagnosis not present

## 2023-04-06 DIAGNOSIS — L02511 Cutaneous abscess of right hand: Secondary | ICD-10-CM | POA: Diagnosis not present

## 2023-04-06 DIAGNOSIS — L03113 Cellulitis of right upper limb: Secondary | ICD-10-CM | POA: Diagnosis not present

## 2023-04-06 LAB — GLUCOSE, CAPILLARY
Glucose-Capillary: 93 mg/dL (ref 70–99)
Glucose-Capillary: 146 mg/dL — ABNORMAL HIGH (ref 70–99)
Glucose-Capillary: 116 mg/dL — ABNORMAL HIGH (ref 70–99)
Glucose-Capillary: 137 mg/dL — ABNORMAL HIGH (ref 70–99)

## 2023-04-06 NOTE — Progress Notes (Signed)
Subjective: 2 Days Post-Op Procedure(s) (LRB): IRRIGATION AND DEBRIDEMENT RIGHT HAND DORSAL AND PALMAR INCISIONS WITH DELAYED PRIMARY CLOSURE (Right) Underwent third procedure on hand on 04/04/23 Able to close dorsal hand incision and palmar hand incision, drains placed. Reports pain continue to improve to the hand this morning. Reports some burning in the index and long fingers but denies any numbness. No recent fevers.  Objective: Vital signs in last 24 hours: Temp:  [97.9 F (36.6 C)-98.6 F (37 C)] 98.6 F (37 C) (07/31 0737) Pulse Rate:  [51-66] 51 (07/31 0737) Resp:  [16-20] 16 (07/31 0737) BP: (136-156)/(80-86) 156/80 (07/31 0737) SpO2:  [96 %-98 %] 96 % (07/31 0737)  Intake/Output from previous day: No intake/output data recorded. Intake/Output this shift: No intake/output data recorded.  Recent Labs    04/05/23 0339 04/06/23 0358  HGB 9.1* 8.9*   Recent Labs    04/05/23 0339 04/06/23 0358  WBC 6.6 7.2  RBC 3.07* 3.00*  HCT 28.3* 28.0*  PLT 322 323   Recent Labs    04/05/23 0339  NA 139  K 3.8  CL 108  CO2 24  BUN 15  CREATININE 1.21*  GLUCOSE 193*  CALCIUM 7.7*   No results for input(s): "LABPT", "INR" in the last 72 hours.  EXAM General - Patient is Alert, Appropriate, and Oriented Right upper extremity-right upper extremity with splint and Ace wrap intact.  She is able to flex and extend DIP joints that are exposed from the splint.  Intact to light touch.  Intact cap refill.  Swelling noted to fingers but improving.  Splint was removed.  Swelling still noted to index and long fingers.  Swelling improved to the volar aspect of the hand, still swollen along the dorsal aspect but she reports minimal pain with palpation.  Advanced drain on both the volar and dorsal aspect of hand this morning.  New splint applied.        Past Medical History:  Diagnosis Date   Anemia    Anxiety    Arthritis    knee (R)   Asthma    Chronic kidney disease     Stage 3 sees Lateef   COPD (chronic obstructive pulmonary disease) (HCC)    Depression    Diabetes mellitus without complication (HCC)    Dyspnea    GERD (gastroesophageal reflux disease)    occ   Hypertension    Hypothyroidism    Increased serum lipids    Wears contact lenses     Assessment/Plan:   2 Days Post-Op Procedure(s) (LRB): IRRIGATION AND DEBRIDEMENT RIGHT HAND DORSAL AND PALMAR INCISIONS WITH DELAYED PRIMARY CLOSURE (Right) Principal Problem:   Cellulitis of right hand Active Problems:   Hypothyroidism   Hypertension   COPD (chronic obstructive pulmonary disease) (HCC)   Depression with anxiety   HLD (hyperlipidemia)   Type II diabetes mellitus with renal manifestations (HCC)   Chronic kidney disease, stage 3a (HCC)   Abnormal LFTs   Severe sepsis (HCC)   Abscess of right hand   MRSA infection  Estimated body mass index is 24.72 kg/m as calculated from the following:   Height as of this encounter: 5\' 4"  (1.626 m).   Weight as of this encounter: 65.3 kg.  Patient doing well this morning, denies severe pain.   Splint intact this morning.  Advanced drains this morning.  Will plan on removing tomorrow. WBC 7.2 this AM, will repeat tomorrow AM. Currently on oral Zyvox.  Initial culture grew MRSA. Possible discharge home  after drain removal on Thursday.  Valeria Batman, PA-C [facility] 04/06/2023, 11:28 AM

## 2023-04-06 NOTE — Plan of Care (Signed)

## 2023-04-06 NOTE — Plan of Care (Signed)
  Problem: Coping: Goal: Ability to adjust to condition or change in health will improve Outcome: Progressing   Problem: Fluid Volume: Goal: Ability to maintain a balanced intake and output will improve Outcome: Progressing   Problem: Metabolic: Goal: Ability to maintain appropriate glucose levels will improve Outcome: Progressing   Problem: Nutritional: Goal: Maintenance of adequate nutrition will improve Outcome: Progressing   

## 2023-04-06 NOTE — Progress Notes (Addendum)
Date of Admission:  03/30/2023     ID: Wanda Smith is a 62 y.o. female  Principal Problem:   Cellulitis of right hand Active Problems:   Hypothyroidism   Hypertension   COPD (chronic obstructive pulmonary disease) (HCC)   Depression with anxiety   HLD (hyperlipidemia)   Type II diabetes mellitus with renal manifestations (HCC)   Chronic kidney disease, stage 3a (HCC)   Abnormal LFTs   Severe sepsis (HCC)   Abscess of right hand   MRSA infection    Subjective: Pt is doing  better Swelling  improved Moving rt fingers   Medications:   citalopram  40 mg Oral QPM   docusate sodium  100 mg Oral BID   enoxaparin (LOVENOX) injection  40 mg Subcutaneous Q24H   ferrous sulfate  325 mg Oral Q breakfast   insulin aspart  0-15 Units Subcutaneous TID WC   insulin aspart  0-5 Units Subcutaneous QHS   levothyroxine  88 mcg Oral QAC breakfast   linezolid  600 mg Oral Q12H   loratadine  10 mg Oral QPM   mometasone-formoterol  2 puff Inhalation BID   montelukast  10 mg Oral QHS   rosuvastatin  10 mg Oral Daily    Objective: Vital signs in last 24 hours: Patient Vitals for the past 24 hrs:  BP Temp Pulse Resp SpO2  04/06/23 0737 (!) 156/80 98.6 F (37 C) (!) 51 16 96 %  04/06/23 0016 (!) 146/84 98.1 F (36.7 C) (!) 55 20 98 %  04/05/23 1517 136/86 97.9 F (36.6 C) 66 18 97 %      PHYSICAL EXAM:  General: Alert, cooperative, no distress,  Lungs: Clear to auscultation bilaterally. No Wheezing or Rhonchi. No rales. Heart: Regular rate and rhythm, no murmur, rub or gallop. Abdomen: Soft, non-tender,not distended. Bowel sounds normal. No masses Extremities: rt arm dressing was changed- Pictures reviewed     Skin:  erythema over rt arm resolved Lymph: Cervical, supraclavicular normal. Neurologic: Grossly non-focal  Lab Results    Latest Ref Rng & Units 04/06/2023    3:58 AM 04/05/2023    3:39 AM 04/03/2023    5:04 AM  CBC  WBC 4.0 - 10.5 K/uL 7.2  6.6  6.2    Hemoglobin 12.0 - 15.0 g/dL 8.9  9.1  9.6   Hematocrit 36.0 - 46.0 % 28.0  28.3  29.4   Platelets 150 - 400 K/uL 323  322  249        Latest Ref Rng & Units 04/05/2023    3:39 AM 04/03/2023    5:04 AM 04/02/2023    3:20 AM  CMP  Glucose 70 - 99 mg/dL 161  096  045   BUN 8 - 23 mg/dL 15  6  8    Creatinine 0.44 - 1.00 mg/dL 4.09  8.11  9.14   Sodium 135 - 145 mmol/L 139  140  137   Potassium 3.5 - 5.1 mmol/L 3.8  3.8  3.1   Chloride 98 - 111 mmol/L 108  111  107   CO2 22 - 32 mmol/L 24  22  22    Calcium 8.9 - 10.3 mg/dL 7.7  7.7  7.5       Microbiology: 03/30/23 X 2 Wound culture MRSA 04/01/23 Dorsum abscess culture- MRSA    Assessment/Plan: MRSA complicated infection of the right hand Abscess on the palmar aspect as well as the dorsal aspect of the hand Status post I&D x 2  Patient  on linezolid day 6- She is doing better will give for another 2 weeks with close monitoring of cbc/ Discussed sideffects and foods to avoid She has not been taking cymbalta  the past 7 days in the hospital- asked her not to take it while on linezolid Will follow her as OP- next week  Anemia- likely due to multiple surgeries, IV fluids, inadequate nutrition in the hospital and blood draw  Diabetes mellitus currently on  insulin  CKD  Hypertension  Hyperlipidemia  Hypothyroidism on Synthroid Discussed the management with the patient and husband Discussed with Dr.Poggi

## 2023-04-06 NOTE — Progress Notes (Signed)
Progress Note   Patient: Wanda Smith GNF:[id number] DOB: 1961/08/03 DOA: 03/30/2023     7 DOS: the patient was seen and examined on 04/06/2023   Brief hospital course: Wanda Smith is a 62 y.o. female with medical history significant of HTN, HLD, DM, COPD, hypothyroidism, depression with anxiety, CKD-3A, anemia, who presents with right hand pain, redness and swelling.   Pt states that she has right hand pain, redness and swelling for more than 3 days. She states that initially the pain and swelling started at the at the base of her index finger on her right hand. She had a small scratch in that area that she sustained about 1 week ago that seem to be healing just fine. The swelling and redness have been progressively worsening, spreading to up to whole dorsal side of right hand.  The pain is constant, sharp, moderate to severe, nonradiating.  Patient was seen in urgent care 7/21 and had x-ray of right index finger which showed soft tissue swelling.  Patient had normal uric acid level 5.7.  She was given prednisone empirically, without any improvement, actually is getting worse. Patient did not have fever at home, but developed fever 101.3 after arrival to floor.  Patient does not have chest pain, cough, shortness of breath.  No nausea, vomiting, diarrhea or abdominal pain.  No symptoms of UTI.     Data reviewed independently and ED Course: pt was found to have WBC 16.1, lactic acid 1.2, renal function close to baseline, abnormal liver function (with ALP 103, AST 59, ALT 99, total bilirubin 0.4).  Blood pressure 152/90, heart rate 81, RR 22> -20, oxygen saturation 96% on room air.  Patient is admitted to MedSurg bed as inpatient.  Dr. Joice Lofts of Ortho is consulted.   Assessment and Plan:  Severe sepsis due to cellulitis/abscess of right hand/palm MRSA abscess Patient had a scratch on the right hand from her dog about a week ago -- Ct showed possible small abscess formation.  Patient  has severe sepsis on admission with WBC 16.1, fever of 101.3, RR up to 22.  Lactic acid 1.2 --> 1.6 --> 2.4.  Procalcitonin 0.16.  Patient developed hypotension with blood pressure 88/61, which has responded to IV fluid resuscitation, and treatment with albumin, midodrine and Solu-Cortef.  -- Consulted Dr. Joice Lofts of ortho. Pt is s/p  Irrigation and debridement of right hand abscess with I&D of right index flexor sheath.  --BP much improved -- Empiric antimicrobial treatment with vancomycin and cefazolin (peri-op) - PRN Zofran for nausea, morphine and oxycodone for pain - Blood cultures x 2 --negative - ESR 39  and CRP 3.2 -  trended lactic acid levels down --WC (deep) rare GPC--staph aureus, MRSA --7/26-- repeat CT scan shows pus collection between second and third metacarpal joint. Patient NPO. Dr Joice Lofts to take to OR for washout. --ID consult with Dr JR--recommends Linezolid and IV unasyn --7/27--pt overall feeling better. Drain + pain 5/10 --7/28-- itching all over the face neck likely due to Dilaudid. Patient agreeable to take ibuprofen with meals and PRN [name] if needed --7/29--pt going to OR for another wash out today per Ortho. No fever. Swelling improving --7/30--doing well s/p  Irrigation and debridement with delayed primary closure of right dorsal and palmar hand abscesses. Cont prn pain meds. Plan to d/c thurs once drain is removed. Cont PO linezolid.  Patient to be discharged home on oral linezolid and will need 2 to 3 weeks of therapy per ID.  Abnormal LFTs: mild.  Likely due to sepsis, Tylenol level less than 10.   Type II diabetes mellitus with renal manifestations [facility]): Recent A1c 6.3, well-controlled.  Patient is taking metformin and farxiga at home -Sliding scale insulin --sugars well controlled with SSI   Hypothyroidism -Synthroid   Hypertension -Hold lisinopril due to stable bp. Can resume at discharge   HLD (hyperlipidemia) -Crestor   COPD (chronic obstructive  pulmonary disease) (HCC): Stable -As needed bronchodilators   Chronic kidney disease, stage 3a (HCC): Stable. --follows with Dr Cherylann Ratel   Depression with anxiety -Continue home medications          Subjective: No new complaints.  Drain remains in place and will likely be removed tomorrow.  Patient to be discharged home on oral linezolid and will need 2 to 3 weeks of treatment per ID.  Physical Exam: Vitals:   04/05/23 0805 04/05/23 1517 04/06/23 0016 04/06/23 0737  BP: 124/73 136/86 (!) 146/84 (!) 156/80  Pulse: 62 66 (!) 55 (!) 51  Resp: 15 18 20 16   Temp: 98.4 F (36.9 C) 97.9 F (36.6 C) 98.1 F (36.7 C) 98.6 F (37 C)  TempSrc:      SpO2: 100% 97% 98% 96%  Weight:      Height:       Physical Exam Vitals and nursing note reviewed.  Constitutional:      Appearance: Normal appearance.  HENT:     Head: Normocephalic and atraumatic.     Nose: Nose normal.     Mouth/Throat:     Mouth: Mucous membranes are moist.  Eyes:     Conjunctiva/sclera: Conjunctivae normal.  Cardiovascular:     Rate and Rhythm: Normal rate and regular rhythm.  Pulmonary:     Effort: Pulmonary effort is normal.     Breath sounds: Normal breath sounds.  Abdominal:     General: Abdomen is flat. Bowel sounds are normal.     Palpations: Abdomen is soft.  Musculoskeletal:     Cervical back: Normal range of motion and neck supple.     Comments: Decreased range of motion right hand.  Dressing over the right hand.  Skin:    General: Skin is warm and dry.  Neurological:     Mental Status: She is alert and oriented to person, place, and time.  Psychiatric:        Mood and Affect: Mood normal.        Behavior: Behavior normal.     Data Reviewed: Labs reviewed.  Hemoglobin 8.9, white count 7.2 There are no new results to review at this time.  Family Communication: Plan of care discussed with patient at the bedside.  Disposition: Status is: Inpatient Remains inpatient appropriate because:  Still has a drain in place for her right hand abscess  Planned Discharge Destination: Home    Time spent: 30 minutes  Author: Lucile Shutters, MD 04/06/2023 12:53 PM  For on call review [email or web address].

## 2023-04-07 DIAGNOSIS — L03113 Cellulitis of right upper limb: Secondary | ICD-10-CM | POA: Diagnosis not present

## 2023-04-07 LAB — AEROBIC/ANAEROBIC CULTURE W GRAM STAIN (SURGICAL/DEEP WOUND): Gram Stain: NONE SEEN

## 2023-04-07 LAB — GLUCOSE, CAPILLARY
Glucose-Capillary: 116 mg/dL — ABNORMAL HIGH (ref 70–99)
Glucose-Capillary: 134 mg/dL — ABNORMAL HIGH (ref 70–99)

## 2023-04-07 MED ORDER — LINEZOLID 600 MG PO TABS: 600.0000 mg | ORAL_TABLET | Freq: Two times a day (BID) | ORAL | 0 refills | Status: AC

## 2023-04-07 MED ORDER — OXYCODONE HCL 5 MG PO TABS: 5.0000 mg | ORAL_TABLET | Freq: Four times a day (QID) | ORAL | 0 refills | Status: AC | PRN

## 2023-04-07 MED ORDER — ONDANSETRON HCL 4 MG PO TABS: 4.0000 mg | ORAL_TABLET | Freq: Four times a day (QID) | ORAL | 0 refills | Status: AC | PRN

## 2023-04-07 NOTE — Discharge Summary (Signed)
Physician Discharge Summary   Patient: Wanda Smith MRN: [id number] DOB: Jan 27, 1961  Admit date:     03/30/2023  Discharge date: 04/07/23  Discharge Physician: Lurene Shadow   PCP: Patrice Paradise, MD   Recommendations at discharge:   Follow-up with orthopedic surgeon on Monday 01/24/2023  Discharge Diagnoses: Principal Problem:   Cellulitis of right hand Active Problems:   Severe sepsis (HCC)   Abnormal LFTs   Type II diabetes mellitus with renal manifestations (HCC)   Hypothyroidism   Hypertension   HLD (hyperlipidemia)   COPD (chronic obstructive pulmonary disease) (HCC)   Chronic kidney disease, stage 3a (HCC)   Depression with anxiety   Abscess of right hand   MRSA infection  Resolved Problems:   * No resolved hospital problems. [facility] Course:  Wanda Smith is a 62 y.o. female with medical history significant for HTN, HLD, DM, COPD, hypothyroidism, depression with anxiety, CKD-3A, anemia, who presented with right hand pain, redness and swelling.   Pt states that she has right hand pain, redness and swelling for more than 3 days. She states that initially the pain and swelling started at the at the base of her index finger on her right hand. She had a small scratch in that area that she sustained about 1 week ago that seem to be healing just fine. The swelling and redness have been progressively worsening, spreading to up to whole dorsal side of right hand.  The pain is constant, sharp, moderate to severe, nonradiating.  Patient was seen in urgent care 7/21 and had x-ray of right index finger which showed soft tissue swelling.  Patient had normal uric acid level 5.7.  She was given prednisone empirically, without any improvement, actually is getting worse. Patient did not have fever at home, but developed fever 101.3 after arrival to floor.   She was found to have severe sepsis secondary to right hand cellulitis with abscess.  He was treated with  empiric IV antibiotics, IV fluids and analgesics. She underwent I&D of right hand abscess with I&D of right index flexor sheath on 03/30/2023. She was taken back to the OR on 04/01/2023 for I&D of right hand abscess from palmar and dorsal approaches. She underwent I&D with delayed primary closure of right dorsal and palmar hand abscesses on 04/04/2023. Deep wound culture was positive for MRSA.  ID was consulted to assist with management.  She recommended that patient be discharged on 2-week course of Zyvox.   Her condition has improved and she is deemed stable for discharge to home today.         Consultants: Orthopedic surgeon, ID specialist Procedures performed: I&D of right hand abscess x 3 Disposition: Home Diet recommendation:  Discharge Diet Orders (From admission, onward)     Start     Ordered   04/07/23 0000  Diet - low sodium heart healthy        04/07/23 1104   04/07/23 0000  Diet Carb Modified        04/07/23 1104           Cardiac and Carb modified diet DISCHARGE MEDICATION: Allergies as of 04/07/2023       Reactions   Pravastatin    Abnormal liver function tests   Tramadol Nausea Only        Medication List     STOP taking these medications    citalopram 40 MG tablet Commonly known as: CELEXA   neomycin-polymyxin-hydrocortisone 3.5-10000-1 OTIC suspension Commonly  known as: CORTISPORIN   predniSONE 10 MG tablet Commonly known as: DELTASONE       TAKE these medications    albuterol 108 (90 Base) MCG/ACT inhaler Commonly known as: VENTOLIN HFA Inhale 1-2 puffs into the lungs every 6 (six) hours as needed for wheezing or shortness of breath.   calcium carbonate 500 MG chewable tablet Commonly known as: TUMS - dosed in mg elemental calcium Chew 1 tablet by mouth as needed for indigestion or heartburn.   dapagliflozin propanediol 10 MG Tabs tablet Commonly known as: FARXIGA Take 10 mg by mouth every morning.   ferrous sulfate 325 (65 FE) MG  tablet Take 325 mg by mouth daily with breakfast.   Fluticasone-Salmeterol 250-50 MCG/DOSE Aepb Commonly known as: ADVAIR Inhale 1 puff into the lungs 2 (two) times daily as needed (shortness of breath).   levocetirizine 5 MG tablet Commonly known as: XYZAL Take 5 mg by mouth every evening.   levothyroxine 88 MCG tablet Commonly known as: SYNTHROID Take 88 mcg by mouth daily before breakfast.   linezolid 600 MG tablet Commonly known as: ZYVOX Take 1 tablet (600 mg total) by mouth every 12 (twelve) hours for 14 days.   lisinopril 40 MG tablet Commonly known as: ZESTRIL Take 40 mg by mouth every morning.   metFORMIN 1000 MG tablet Commonly known as: GLUCOPHAGE Take 1,000 mg by mouth 2 (two) times daily with a meal.   montelukast 10 MG tablet Commonly known as: SINGULAIR Take 10 mg by mouth at bedtime.   ondansetron 4 MG tablet Commonly known as: ZOFRAN Take 1 tablet (4 mg total) by mouth every 6 (six) hours as needed for nausea.   oxyCODONE 5 MG immediate release tablet Commonly known as: Oxy IR/ROXICODONE Take 1-2 tablets (5-10 mg total) by mouth every 6 (six) hours as needed for moderate pain.   rosuvastatin 10 MG tablet Commonly known as: CRESTOR Take 10 mg by mouth at bedtime.               Discharge Care Instructions  (From admission, onward)           Start     Ordered   04/07/23 0000  Discharge wound care:       Comments: Follow up with orthopedic surgeon for wound care on 04/11/2023   04/07/23 1104            Follow-up Information     Anson Oregon, PA-C Follow up on 04/11/2023.   Specialty: Physician Assistant Why: Call to schedule appt for Monday for a skin check of the hand. Contact information: [address] [place] [address] [phone]                Discharge Exam: Filed Weights   03/30/23 0027 04/01/23 1601 04/04/23 1433  Weight: 65.3 kg 65.3 kg 65.3 kg   GEN: NAD SKIN: Warm and dry EYES: No  pallor or icterus ENT: MMM CV: RRR PULM: CTA B ABD: soft, ND, NT, +BS CNS: AAO x 3, non focal EXT: Dressing on right forearm and right hand   Condition at discharge: good  The results of significant diagnostics from this hospitalization (including imaging, microbiology, ancillary and laboratory) are listed below for reference.   Imaging Studies: CT HAND RIGHT W CONTRAST  Result Date: 04/01/2023 CLINICAL DATA:  Evaluate for persistent/recurrent abscess right hand EXAM: CT OF THE UPPER RIGHT EXTREMITY WITH CONTRAST TECHNIQUE: Multidetector CT imaging of the upper right extremity was performed according to the  standard protocol following intravenous contrast administration. RADIATION DOSE REDUCTION: This exam was performed according to the departmental dose-optimization program which includes automated exposure control, adjustment of the mA and/or kV according to patient size and/or use of iterative reconstruction technique. CONTRAST:  OMNIPAQUE IOHEXOL 300 MG/ML  SOLN COMPARISON:  CT 03/30/2023 FINDINGS: Bones/Joint/Cartilage No acute fracture. No dislocation. No sites of erosion or periosteal elevation. Unchanged degenerative changes, most notable at the long finger DIP joint. No significant joint effusion at the wrist. Ligaments Suboptimally assessed by CT. Muscles and Tendons Moderate tenosynovitis of the second and fourth extensor compartments, similar to prior. Peripherally enhancing fluid collection is again noted between the second and third metacarpals measuring approximately 4.0 x 0.6 x 1.7 cm (previously 4.0 x 0.3 x 0.9 cm). Soft tissues Marked soft tissue edema and ill-defined fluid along the dorsum of the hand, wrist, and distal forearm. No new rim enhancing fluid collection. No soft tissue gas. IMPRESSION: 1. Slight interval enlargement of peripherally enhancing abscess between the second and third metacarpals measuring approximately 4.0 x 0.6 x 1.7 cm (previously 4.0 x 0.3 x 0.9  cm). 2. Marked cellulitis of the dorsum of the hand, wrist, and distal forearm. No new rim enhancing fluid collection. No soft tissue gas. 3. Moderate tenosynovitis of the second and fourth extensor compartments, similar to prior. 4. No CT evidence of osteomyelitis. Electronically Signed   By: Duanne Guess D.O.   On: 04/01/2023 10:38   CT HAND RIGHT W CONTRAST  Result Date: 03/30/2023 CLINICAL DATA:  Right hand cellulitis, evaluate for abscess. EXAM: CT OF THE UPPER RIGHT EXTREMITY WITH CONTRAST TECHNIQUE: Multidetector CT imaging of the upper right extremity was performed according to the standard protocol following intravenous contrast administration. RADIATION DOSE REDUCTION: This exam was performed according to the departmental dose-optimization program which includes automated exposure control, adjustment of the mA and/or kV according to patient size and/or use of iterative reconstruction technique. CONTRAST:  OMNIPAQUE IOHEXOL 300 MG/ML  SOLN COMPARISON:  None Available. FINDINGS: Bones/Joint/Cartilage Osseous structures are within normal limits. No evidence of fracture or osteonecrosis. Scattered cystic changes in the carpal bones, otherwise no significant arthropathy. Ligaments Suboptimally assessed by CT. Muscles and Tendons Thenar and hypothenar muscles are normal in bulk. No intramuscular hematoma or fluid collection. There is a peripherally enhancing hypodense area in the between the second and third metacarpals measuring a proximally 0.9 x 0.3 x 4.0 cm (series 5, image 53; series 8, image 19) suspicious for developing abscess. Soft tissues Marked soft tissue edema about the second digit and dorsum of the hand suggesting cellulitis. IMPRESSION: 1. Marked soft tissue edema about the second digit and dorsum of the hand suggesting cellulitis. 2. There is a peripherally enhancing hypodense area between the second and third metacarpals soft tissues measuring 0.9 x 0.3 x 4.0 cm, suspicious for  developing abscess. Clinical correlation and continued follow-up examination is suggested. 3. No evidence of fracture or osteonecrosis. Electronically Signed   By: Larose Hires D.O.   On: 03/30/2023 17:47   DG Hand Complete Right  Result Date: 03/27/2023 CLINICAL DATA:  Index finger MCP swelling, pain, and redness for 2 days. EXAM: RIGHT HAND - COMPLETE 3+ VIEW COMPARISON:  None Available. FINDINGS: No acute fracture, dislocation, or destructive osseous lesion is identified. There are prominent degenerative changes at the DIP joint of the long finger with milder degenerative changes in the index finger. Index finger soft tissue swelling is noted without evidence of a radiopaque foreign body or  subcutaneous emphysema. IMPRESSION: Index finger soft tissue swelling without acute osseous abnormality identified. Electronically Signed   By: Sebastian Ache M.D.   On: 03/27/2023 13:17    Microbiology: Results for orders placed or performed during the hospital encounter of 03/30/23  Blood culture (routine x 2)     Status: None   Collection Time: 03/30/23 12:32 AM   Specimen: BLOOD  Result Value Ref Range Status   Specimen Description BLOOD LEFT AC  Final   Special Requests   Final    BOTTLES DRAWN AEROBIC AND ANAEROBIC Blood Culture results may not be optimal due to an excessive volume of blood received in culture bottles   Culture   Final    NO GROWTH 5 DAYS Performed at [facility], [address] [place], [place] [address]    Report Status 04/04/2023 FINAL  Final  Blood culture (routine x 2)     Status: None   Collection Time: 03/30/23  4:01 AM   Specimen: BLOOD  Result Value Ref Range Status   Specimen Description BLOOD LEFT ARM  Final   Special Requests   Final    BOTTLES DRAWN AEROBIC AND ANAEROBIC Blood Culture results may not be optimal due to an inadequate volume of blood received in culture bottles   Culture   Final    NO GROWTH 5 DAYS Performed at [facility], [address] [place], [place] [address]    Report Status 04/04/2023 FINAL  Final  Aerobic/Anaerobic Culture w Gram Stain (surgical/deep wound)     Status: None   Collection Time: 03/30/23  7:41 PM   Specimen: PATH Soft tissue  Result Value Ref Range Status   Specimen Description   Final    ABSCESS Performed at [facility], [address] [place], [place] [address]    Special Requests   Final    SOFT TISSUE Performed at [facility], [address] [place], [place] [address]    Gram Stain   Final    RARE WBC PRESENT, PREDOMINANTLY PMN NO ORGANISMS SEEN    Culture   Final    FEW METHICILLIN RESISTANT STAPHYLOCOCCUS AUREUS NO ANAEROBES ISOLATED Performed at [facility], [address], [place], [place] [address]    Report Status 04/05/2023 FINAL  Final   Organism ID, Bacteria METHICILLIN RESISTANT STAPHYLOCOCCUS AUREUS  Final      Susceptibility   Methicillin resistant staphylococcus aureus - MIC*    CIPROFLOXACIN >=8 RESISTANT Resistant     ERYTHROMYCIN >=8 RESISTANT Resistant     GENTAMICIN <=0.5 SENSITIVE Sensitive     OXACILLIN >=4 RESISTANT Resistant     TETRACYCLINE <=1 SENSITIVE Sensitive     VANCOMYCIN <=0.5 SENSITIVE Sensitive     TRIMETH/SULFA <=10 SENSITIVE Sensitive     CLINDAMYCIN <=0.25 SENSITIVE Sensitive     RIFAMPIN <=0.5 SENSITIVE Sensitive     Inducible Clindamycin NEGATIVE Sensitive     LINEZOLID 2 SENSITIVE Sensitive     * FEW METHICILLIN RESISTANT STAPHYLOCOCCUS AUREUS  Aerobic/Anaerobic Culture w Gram Stain (surgical/deep wound)     Status: None   Collection Time: 03/30/23  7:45 PM   Specimen: PATH Soft tissue  Result Value Ref Range Status   Specimen Description   Final    ABSCESS Performed at [facility], [address], [place], [place] [address]    Special Requests   Final    SOFT TISSUE Performed at [facility], [address], [place], [place] [address]  Gram Stain   Final    RARE WBC  PRESENT, PREDOMINANTLY PMN RARE GRAM POSITIVE COCCI    Culture   Final    MODERATE METHICILLIN RESISTANT STAPHYLOCOCCUS AUREUS NO ANAEROBES ISOLATED Performed at [facility], [address] [place], [place] [address]    Report Status 04/05/2023 FINAL  Final   Organism ID, Bacteria METHICILLIN RESISTANT STAPHYLOCOCCUS AUREUS  Final      Susceptibility   Methicillin resistant staphylococcus aureus - MIC*    CIPROFLOXACIN >=8 RESISTANT Resistant     ERYTHROMYCIN >=8 RESISTANT Resistant     GENTAMICIN <=0.5 SENSITIVE Sensitive     OXACILLIN >=4 RESISTANT Resistant     TETRACYCLINE <=1 SENSITIVE Sensitive     VANCOMYCIN 1 SENSITIVE Sensitive     TRIMETH/SULFA <=10 SENSITIVE Sensitive     CLINDAMYCIN <=0.25 SENSITIVE Sensitive     RIFAMPIN <=0.5 SENSITIVE Sensitive     Inducible Clindamycin NEGATIVE Sensitive     LINEZOLID 2 SENSITIVE Sensitive     * MODERATE METHICILLIN RESISTANT STAPHYLOCOCCUS AUREUS  Aerobic/Anaerobic Culture w Gram Stain (surgical/deep wound)     Status: None (Preliminary result)   Collection Time: 04/01/23  5:02 PM   Specimen: Hand, Right; Tissue  Result Value Ref Range Status   Specimen Description   Final    WOUND Performed at [facility], [address] [place], [place] [address]    Special Requests   Final    RIGHT DORSAL HAND ABSCESS Performed at [facility], [address] [place], [place] [address]    Gram Stain   Final    NO WBC SEEN NO ORGANISMS SEEN Performed at [facility], [address], [place], [place] [address]    Culture   Final    RARE STAPHYLOCOCCUS AUREUS SUSCEPTIBILITIES PERFORMED ON PREVIOUS CULTURE WITHIN THE LAST 5 DAYS. NO ANAEROBES ISOLATED; CULTURE IN PROGRESS FOR 5 DAYS    Report Status PENDING  Incomplete    Labs: CBC: Recent Labs  Lab 04/02/23 0320 04/03/23 0504 04/05/23 0339 04/06/23 0358 04/07/23 0344  WBC 9.2 6.2 6.6 7.2 6.4  HGB 9.5* 9.6* 9.1* 8.9* 9.1*  HCT 29.0* 29.4*  28.3* 28.0* 28.2*  MCV 91.8 93.6 92.2 93.3 93.1  PLT 259 249 322 323 350   Basic Metabolic Panel: Recent Labs  Lab 04/01/23 0911 04/02/23 0320 04/03/23 0504 04/05/23 0339  NA  --  137 140 139  K  --  3.1* 3.8 3.8  CL  --  107 111 108  CO2  --  22 22 24   GLUCOSE  --  143* 126* 193*  BUN  --  8 6* 15  CREATININE 1.19* 1.12* 1.03* 1.21*  CALCIUM  --  7.5* 7.7* 7.7*   Liver Function Tests: No results for input(s): "AST", "ALT", "ALKPHOS", "BILITOT", "PROT", "ALBUMIN" in the last 168 hours. CBG: Recent Labs  Lab 04/06/23 0740 04/06/23 1137 04/06/23 1603 04/06/23 2119 04/07/23 0732  GLUCAP 93 146* 116* 137* 116*    Discharge time spent: greater than 30 minutes.  Signed: Lurene Shadow, MD [facility] 04/07/2023

## 2023-04-07 NOTE — Discharge Instructions (Addendum)
Diet: As you were doing prior to hospitalization   Shower:  May shower but keep the wounds dry.  Dressing:  Keep dressing on until follow-up appointment.  Activity:  Increase activity slowly as tolerated, but follow the weight bearing instructions below.  No lifting or driving for 6 weeks.  Weight Bearing:   Minimal weightbearing to the right arm.  To prevent constipation: you may use a stool softener such as -  Colace (over the counter) 100 mg by mouth twice a day  Drink plenty of fluids (prune juice may be helpful) and high fiber foods Miralax (over the counter) for constipation as needed.    Itching:  If you experience itching with your medications, try taking only a single pain pill, or even half a pain pill at a time.  You may take up to 10 pain pills per day, and you can also use benadryl over the counter for itching or also to help with sleep.   Precautions:  If you experience chest pain or shortness of breath - call 911 immediately for transfer to the hospital emergency department!!  If you develop a fever greater that 101 F, purulent drainage from wound, increased redness or drainage from wound, or calf pain-Call [facility]                                              Follow- Up Appointment:  Follow-up on Monday for skin check.  While on the antibiotic linezolid avoid or minimize following foods and drinks as they may interact with linezolid.  These foods and drinks contain tyramine.  Tyramine is a natural product found in some plants and animals.  Too much tyramine in combination with linezolid can cause high levels of serotonin in the body.  Serotonin is a chemical in our body that controls mood, sleep, digestion, and other functions.  Several signs of too much serotonin in the body (also called serotonin syndrome) are fast heart rate, sweating, fevers, high blood pressure, muscle twitching, and confusion.  It is important to seek medical attention if you have these  symptoms.   Avoid foods and beverages that are high in tyramine while taking linezolid, including: Alcoholic beverages (such as beer, red wine, vermouth, sherry) Aged cheeses (such as cheddar, blue cheese, swiss, feta, parmesan, camembert) Fermented or pickled foods (such as sauerkraut, pickled beets, pickled peppers, pickled cucumbers/pickles, kim chee/kimchi)  Dried/aged, smoked or processed meats and sausages (such as salami, pepperoni, liverwurst, hot dogs, bologna, bacon) Soybean products (such as soy sauce, tofu) Preserved fish (such as pickled herring) Products that contain large amounts of yeast (such as bouillon cubes, powdered soup/gravy, homemade or sourdough bread)  Broad/fava beans  Following foods are OK to eat while taking linezolid: Pasteurized cheeses (such as [place], ricotta, cottage cheese, cream cheese) Vegetables (not fermented or pickled) Non-cured or smoked meats

## 2023-04-07 NOTE — Plan of Care (Signed)
  Problem: Coping: Goal: Ability to adjust to condition or change in health will improve Outcome: Progressing   Problem: Fluid Volume: Goal: Ability to maintain a balanced intake and output will improve Outcome: Progressing   Problem: Health Behavior/Discharge Planning: Goal: Ability to identify and utilize available resources and services will improve Outcome: Progressing   Problem: Metabolic: Goal: Ability to maintain appropriate glucose levels will improve Outcome: Progressing   Problem: Nutritional: Goal: Maintenance of adequate nutrition will improve Outcome: Progressing   

## 2023-04-07 NOTE — Progress Notes (Signed)
Subjective: 3 Days Post-Op Procedure(s) (LRB): IRRIGATION AND DEBRIDEMENT RIGHT HAND DORSAL AND PALMAR INCISIONS WITH DELAYED PRIMARY CLOSURE (Right) Underwent third procedure on hand on 04/04/23 Able to close dorsal hand incision and palmar hand incision, drains placed. Reports pain continue to improve to the hand this morning. Reports some burning in the index and long fingers but denies any numbness. No recent fevers.  Objective: Vital signs in last 24 hours: Temp:  [98 F (36.7 C)-98.5 F (36.9 C)] 98.5 F (36.9 C) (08/01 0731) Pulse Rate:  [51-59] 59 (08/01 0731) Resp:  [16-17] 16 (08/01 0731) BP: (130-175)/(76-90) 145/82 (08/01 0731) SpO2:  [95 %-99 %] 97 % (08/01 0731)  Intake/Output from previous day: No intake/output data recorded. Intake/Output this shift: No intake/output data recorded.  Recent Labs    04/05/23 0339 04/06/23 0358 04/07/23 0344  HGB 9.1* 8.9* 9.1*   Recent Labs    04/06/23 0358 04/07/23 0344  WBC 7.2 6.4  RBC 3.00* 3.03*  HCT 28.0* 28.2*  PLT 323 350   Recent Labs    04/05/23 0339  NA 139  K 3.8  CL 108  CO2 24  BUN 15  CREATININE 1.21*  GLUCOSE 193*  CALCIUM 7.7*   No results for input(s): "LABPT", "INR" in the last 72 hours.  EXAM General - Patient is Alert, Appropriate, and Oriented Right upper extremity-right upper extremity with splint and Ace wrap intact.  She is able to flex and extend DIP joints that are exposed from the splint.  Intact to light touch.  Intact cap refill.  Swelling noted to fingers but improving.  Splint was removed.  Swelling still noted to index and long fingers but improving.  Swelling improved to the volar aspect of the hand, swelling improved to the dorsal aspect of the hand and wrist this morning.  Drains removed this AM.  New splint applied.      Past Medical History:  Diagnosis Date   Anemia    Anxiety    Arthritis    knee (R)   Asthma    Chronic kidney disease    Stage 3 sees Lateef    COPD (chronic obstructive pulmonary disease) (HCC)    Depression    Diabetes mellitus without complication (HCC)    Dyspnea    GERD (gastroesophageal reflux disease)    occ   Hypertension    Hypothyroidism    Increased serum lipids    Wears contact lenses     Assessment/Plan:   3 Days Post-Op Procedure(s) (LRB): IRRIGATION AND DEBRIDEMENT RIGHT HAND DORSAL AND PALMAR INCISIONS WITH DELAYED PRIMARY CLOSURE (Right) Principal Problem:   Cellulitis of right hand Active Problems:   Hypothyroidism   Hypertension   COPD (chronic obstructive pulmonary disease) (HCC)   Depression with anxiety   HLD (hyperlipidemia)   Type II diabetes mellitus with renal manifestations (HCC)   Chronic kidney disease, stage 3a (HCC)   Abnormal LFTs   Severe sepsis (HCC)   Abscess of right hand   MRSA infection  Estimated body mass index is 24.72 kg/m as calculated from the following:   Height as of this encounter: 5\' 4"  (1.626 m).   Weight as of this encounter: 65.3 kg.  Patient doing well this morning, denies severe pain.   Splint intact this morning.  Removed drains this morning. WBC 6.4 this AM. Currently on oral Zyvox.  Initial culture grew MRSA. Cleared for discharge home from orthopaedic perspective.  She will follow-up in the office on Monday for a repeat  skin check.  Valeria Batman, PA-C [facility] 04/07/2023, 10:34 AM

## 2023-04-07 NOTE — Progress Notes (Signed)
DISCHARGE NOTE:   Pt discharged with IV removed and education provided. Pt has no further questions or concerns at this time. Pt wheeled down with personal belongings to the medical mall entrance and transportation provided via pt's husband.

## 2023-04-08 NOTE — Anesthesia Postprocedure Evaluation (Signed)
Anesthesia Post Note  Patient: Wanda Smith  Procedure(s) Performed: IRRIGATION AND DEBRIDEMENT RIGHT HAND DORSAL AND PALMAR INCISIONS WITH DELAYED PRIMARY CLOSURE (Right: Hand)  Patient location during evaluation: PACU Anesthesia Type: General Level of consciousness: awake and alert Pain management: pain level controlled Vital Signs Assessment: post-procedure vital signs reviewed and stable Respiratory status: spontaneous breathing, nonlabored ventilation, respiratory function stable and patient connected to nasal cannula oxygen Cardiovascular status: blood pressure returned to baseline and stable Postop Assessment: no apparent nausea or vomiting Anesthetic complications: no   No notable events documented.   Last Vitals:  Vitals:   04/06/23 2213 04/07/23 0731  BP: 130/76 (!) 145/82  Pulse: (!) 59 (!) 59  Resp:  16  Temp: 36.7 C 36.9 C  SpO2: 95% 97%    Last Pain:  Vitals:   04/07/23 0824  TempSrc:   PainSc: 3                  Lenard Simmer

## 2023-04-14 ENCOUNTER — Encounter: Payer: Self-pay | Admitting: Infectious Diseases

## 2023-04-14 ENCOUNTER — Ambulatory Visit: Payer: No Typology Code available for payment source | Attending: Infectious Diseases | Admitting: Infectious Diseases

## 2023-04-14 ENCOUNTER — Other Ambulatory Visit
Admission: RE | Admit: 2023-04-14 | Discharge: 2023-04-14 | Disposition: A | Discharge status: Home / Self Care | Payer: No Typology Code available for payment source | Source: Ambulatory Visit | Attending: Infectious Diseases | Admitting: Infectious Diseases

## 2023-04-14 VITALS — BP 109/74 | HR 64 | Temp 96.9°F | Ht 64.0 in | Wt 144.0 lb

## 2023-04-14 DIAGNOSIS — L089 Local infection of the skin and subcutaneous tissue, unspecified: Secondary | ICD-10-CM

## 2023-04-14 DIAGNOSIS — E1122 Type 2 diabetes mellitus with diabetic chronic kidney disease: Secondary | ICD-10-CM | POA: Diagnosis not present

## 2023-04-14 DIAGNOSIS — Z87891 Personal history of nicotine dependence: Secondary | ICD-10-CM | POA: Diagnosis not present

## 2023-04-14 DIAGNOSIS — Z7984 Long term (current) use of oral hypoglycemic drugs: Secondary | ICD-10-CM | POA: Diagnosis not present

## 2023-04-14 DIAGNOSIS — I129 Hypertensive chronic kidney disease with stage 1 through stage 4 chronic kidney disease, or unspecified chronic kidney disease: Secondary | ICD-10-CM | POA: Diagnosis not present

## 2023-04-14 DIAGNOSIS — N183 Chronic kidney disease, stage 3 unspecified: Secondary | ICD-10-CM | POA: Diagnosis not present

## 2023-04-14 DIAGNOSIS — W458XXD Other foreign body or object entering through skin, subsequent encounter: Secondary | ICD-10-CM | POA: Diagnosis not present

## 2023-04-14 DIAGNOSIS — A4902 Methicillin resistant Staphylococcus aureus infection, unspecified site: Secondary | ICD-10-CM

## 2023-04-14 DIAGNOSIS — B9562 Methicillin resistant Staphylococcus aureus infection as the cause of diseases classified elsewhere: Secondary | ICD-10-CM | POA: Insufficient documentation

## 2023-04-14 DIAGNOSIS — S61411D Laceration without foreign body of right hand, subsequent encounter: Secondary | ICD-10-CM | POA: Insufficient documentation

## 2023-04-14 LAB — COMPREHENSIVE METABOLIC PANEL
ALT: 28 U/L (ref 0–44)
AST: 17 U/L (ref 15–41)
Albumin: 3.9 g/dL (ref 3.5–5.0)
Alkaline Phosphatase: 220 U/L — ABNORMAL HIGH (ref 38–126)
Anion gap: 12 (ref 5–15)
BUN: 19 mg/dL (ref 8–23)
CO2: 24 mmol/L (ref 22–32)
Calcium: 9.2 mg/dL (ref 8.9–10.3)
Chloride: 103 mmol/L (ref 98–111)
Creatinine, Ser: 1.15 mg/dL — ABNORMAL HIGH (ref 0.44–1.00)
GFR, Estimated: 54 mL/min — ABNORMAL LOW (ref >60)
Glucose, Bld: 99 mg/dL (ref 70–99)
Potassium: 3.7 mmol/L (ref 3.5–5.1)
Sodium: 139 mmol/L (ref 135–145)
Total Bilirubin: 0.5 mg/dL (ref 0.3–1.2)
Total Protein: 7.4 g/dL (ref 6.5–8.1)

## 2023-04-14 LAB — CBC WITH DIFFERENTIAL/PLATELET
Abs Immature Granulocytes: 0.01 10*3/uL (ref 0.00–0.07)
Basophils Absolute: 0.1 10*3/uL (ref 0.0–0.1)
Basophils Relative: 1 %
Eosinophils Absolute: 0.1 10*3/uL (ref 0.0–0.5)
Eosinophils Relative: 2 %
HCT: 34.8 % — ABNORMAL LOW (ref 36.0–46.0)
Hemoglobin: 11.1 g/dL — ABNORMAL LOW (ref 12.0–15.0)
Immature Granulocytes: 0 %
Lymphocytes Relative: 29 %
Lymphs Abs: 1.8 10*3/uL (ref 0.7–4.0)
MCH: 30.5 pg (ref 26.0–34.0)
MCHC: 31.9 g/dL (ref 30.0–36.0)
MCV: 95.6 fL (ref 80.0–100.0)
Monocytes Absolute: 0.5 10*3/uL (ref 0.1–1.0)
Monocytes Relative: 8 %
Neutro Abs: 3.7 10*3/uL (ref 1.7–7.7)
Neutrophils Relative %: 60 %
Platelets: 500 10*3/uL — ABNORMAL HIGH (ref 150–400)
RBC: 3.64 MIL/uL — ABNORMAL LOW (ref 3.87–5.11)
RDW: 14.3 % (ref 11.5–15.5)
WBC: 6.2 10*3/uL (ref 4.0–10.5)
nRBC: 0 % (ref 0.0–0.2)

## 2023-04-14 LAB — C-REACTIVE PROTEIN: CRP: 0.6 mg/dL (ref <1.0)

## 2023-04-14 NOTE — Patient Instructions (Signed)
You are here for the rt hand infection and you are doing so much better- you will complete linezolid 1 week- today will do labs

## 2023-04-14 NOTE — Progress Notes (Signed)
NAME: Wanda Smith  DOB: 1961/06/09  MRN: [id number]  Date/Time: 04/14/2023 12:03 PM  Subjective:   Patient is here with her husband for follow-up she was recently hospitalized between 03/30/2023 until 04/07/2023 for right hand infection. Wanda Smith is a 62 y.o. female with a history of diabetes mellitus,Chronic kidney disease, hypertension, COPD, anemia was recently in the hospital with right hands severe pain redness and swelling.  She had initially gone to the urgent care 03/27/2023 with some swelling and pain on the right index finger and was given prednisone for possible gout.  As it got worse and she was having swelling extending up her arm she came to the emergency department on 03/30/2023.  Initially there was a question of whether there was a dog but later the family figured out that it was the steel chain that scratched her hand. A CT of the hand revealed marked soft tissue edema about the second digit on the Dorsum of the hand suggestive of cellulitis.  There was a peripherally enhancing hypodense area between the second and third metacarpal soft tissue measuring 0.9 and 2.3 to 0.4 cm suspicious for developing abscess.  She underwent an I&D on 03/30/2023 and cultures were sent which was positive for MRSA.  As the swelling of the hand had not gotten any better she had a repeat CAT scan and that showed interval enlargement of the peripheral enhancing abscess.  She was taken back for surgery on 04/01/2023 and underwent I&D from f both the palmar and dorsal approaches.  Had a Prevnar VAC placed on the dorsal aspect and had Penrose drain on the palmar surface.  She was taken back on 04/04/2023 for I&D and delayed primary closure of the right dorsal and palmar hand abscesses.  All the cultures had MRSA.  She was initially on vancomycin which was changed to linezolid on 04/01/2023.  And she has been on it since then.  She was discharged on 04/07/2023. She says she is doing very well This swelling  of the arm is much improved She has got some stiffness in her index finger and is not able to move it very well.  She had the dressing changed by Ortho this Monday No fever or chills No side effects to the linezolid She is 100% adherent to it She had gone to the manicure place and had cut her nails after she got out of the hospital.  She was careful while doing that and she protected her palms on the dressing with the glove.  Past Medical History:  Diagnosis Date   Anemia    Anxiety    Arthritis    knee (R)   Asthma    Chronic kidney disease    Stage 3 sees Lateef   COPD (chronic obstructive pulmonary disease) (HCC)    Depression    Diabetes mellitus without complication (HCC)    Dyspnea    GERD (gastroesophageal reflux disease)    occ   Hypertension    Hypothyroidism    Increased serum lipids    Wears contact lenses     Past Surgical History:  Procedure Laterality Date   ABDOMINAL HYSTERECTOMY     CARPAL TUNNEL RELEASE Right 07/26/2018   Procedure: CARPAL TUNNEL RELEASE ENDOSCOPIC;  Surgeon: Christena Flake, MD;  Location: [facility];  Service: Orthopedics;  Laterality: Right;  diabetic - oral meds   CHOLECYSTECTOMY     COLONOSCOPY WITH ESOPHAGOGASTRODUODENOSCOPY (EGD)     COLONOSCOPY WITH PROPOFOL N/A 01/13/2018  Procedure: COLONOSCOPY WITH PROPOFOL;  Surgeon: Scot Jun, MD;  Location: [facility];  Service: Endoscopy;  Laterality: N/A;   HERNIA REPAIR     I & D EXTREMITY Right 04/01/2023   Procedure: IRRIGATION AND DEBRIDEMENT EXTREMITY PREVENA PLACEMENT;  Surgeon: Christena Flake, MD;  Location: [facility];  Service: Orthopedics;  Laterality: Right;   INCISION AND DRAINAGE ABSCESS Right 03/30/2023   Procedure: INCISION AND DRAINAGE ABSCESS RIGHT HAND;  Surgeon: Christena Flake, MD;  Location: [facility];  Service: Orthopedics;  Laterality: Right;   INCISION AND DRAINAGE OF WOUND Right 04/04/2023   Procedure: IRRIGATION AND DEBRIDEMENT RIGHT HAND DORSAL AND PALMAR  INCISIONS WITH DELAYED PRIMARY CLOSURE;  Surgeon: Christena Flake, MD;  Location: [facility];  Service: Orthopedics;  Laterality: Right;   left rotator cuff repair Left    SHOULDER ARTHROSCOPY WITH SUBACROMIAL DECOMPRESSION, ROTATOR CUFF REPAIR AND BICEP TENDON REPAIR Right 10/21/2021   Procedure: SHOULDER ARTHROSCOPY WITH DEBRIDEMENT, DECOMPRESSION, BICEPS TENODESIS.;  Surgeon: Christena Flake, MD;  Location: [facility];  Service: Orthopedics;  Laterality: Right;   TUBAL LIGATION      Social History   Socioeconomic History   Marital status: Married    Spouse name: Not on file   Number of children: Not on file   Years of education: Not on file   Highest education level: Not on file  Occupational History   Not on file  Tobacco Use   Smoking status: Former    Current packs/day: 0.00    Average packs/day: 0.8 packs/day for 20.0 years (15.0 ttl pk-yrs)    Types: Cigarettes    Start date: 09/07/1987    Quit date: 09/07/2007    Years since quitting: 15.6   Smokeless tobacco: Never  Vaping Use   Vaping status: Never Used  Substance and Sexual Activity   Alcohol use: Yes    Alcohol/week: 2.0 standard drinks of alcohol    Types: 2 Standard drinks or equivalent per week    Comment: occasionally (4x/yr)   Drug use: Never   Sexual activity: Not on file  Other Topics Concern   Not on file  Social History Narrative   Not on file   Social Determinants of Health   Financial Resource Strain: Not on file  Food Insecurity: No Food Insecurity (03/30/2023)   Hunger Vital Sign    Worried About Running Out of Food in the Last Year: Never true    Ran Out of Food in the Last Year: Never true  Transportation Needs: No Transportation Needs (03/30/2023)   PRAPARE - Administrator, Civil Service (Medical): No    Lack of Transportation (Non-Medical): No  Physical Activity: Not on file  Stress: Not on file  Social Connections: Not on file  Intimate Partner Violence: Not At Risk (03/30/2023)    Humiliation, Afraid, Rape, and Kick questionnaire    Fear of Current or Ex-Partner: No    Emotionally Abused: No    Physically Abused: No    Sexually Abused: No    Family History  Problem Relation Age of Onset   Breast cancer Mother 57   Breast cancer Sister 71   Diabetes Sister    Diabetes Father    Diabetes Brother    Allergies  Allergen Reactions   Pravastatin     Abnormal liver function tests   Tramadol Nausea Only   I? Current Outpatient Medications  Medication Sig Dispense Refill   albuterol (PROVENTIL HFA;VENTOLIN HFA) 108 (90 Base) MCG/ACT inhaler  Inhale 1-2 puffs into the lungs every 6 (six) hours as needed for wheezing or shortness of breath.     calcium carbonate (TUMS - DOSED IN MG ELEMENTAL CALCIUM) 500 MG chewable tablet Chew 1 tablet by mouth as needed for indigestion or heartburn.     dapagliflozin propanediol (FARXIGA) 10 MG TABS tablet Take 10 mg by mouth every morning.     ferrous sulfate 325 (65 FE) MG tablet Take 325 mg by mouth daily with breakfast.     Fluticasone-Salmeterol (ADVAIR) 250-50 MCG/DOSE AEPB Inhale 1 puff into the lungs 2 (two) times daily as needed (shortness of breath).     levocetirizine (XYZAL) 5 MG tablet Take 5 mg by mouth every evening.     levothyroxine (SYNTHROID) 88 MCG tablet Take 88 mcg by mouth daily before breakfast.     linezolid (ZYVOX) 600 MG tablet Take 1 tablet (600 mg total) by mouth every 12 (twelve) hours for 14 days. 28 tablet 0   lisinopril (PRINIVIL,ZESTRIL) 40 MG tablet Take 40 mg by mouth every morning.     metFORMIN (GLUCOPHAGE) 1000 MG tablet Take 1,000 mg by mouth 2 (two) times daily with a meal.     montelukast (SINGULAIR) 10 MG tablet Take 10 mg by mouth at bedtime.     ondansetron (ZOFRAN) 4 MG tablet Take 1 tablet (4 mg total) by mouth every 6 (six) hours as needed for nausea. 30 tablet 0   oxyCODONE (OXY IR/ROXICODONE) 5 MG immediate release tablet Take 1-2 tablets (5-10 mg total) by mouth every 6 (six) hours as  needed for moderate pain. 30 tablet 0   rosuvastatin (CRESTOR) 10 MG tablet Take 10 mg by mouth at bedtime.     No current facility-administered medications for this visit.     Abtx:  Anti-infectives (From admission, onward)    None       REVIEW OF SYSTEMS:  Const: negative fever, negative chills, negative weight loss Eyes: negative diplopia or visual changes, negative eye pain ENT: negative coryza, negative sore throat Resp: negative cough, hemoptysis, dyspnea Cards: negative for chest pain, palpitations, lower extremity edema GU: negative for frequency, dysuria and hematuria GI: Negative for abdominal pain, diarrhea, bleeding, constipation Skin: negative for rash and pruritus Heme: negative for easy bruising and gum/nose bleeding [id number] for headaches, dizziness, vertigo, memory problems  Psych: negative for feelings of anxiety, depression  Endocrine: , diabetes Allergy/Immunology-as above: Objective:  VITALS:  BP 109/74   Pulse 64   Temp (!) 96.9 F (36.1 C) (Temporal)   Ht 5\' 4"  (1.626 m)   Wt 144 lb (65.3 kg)   BMI 24.72 kg/m   PHYSICAL EXAM:  General: Alert, cooperative, no distress, appears stated age.  Head: Normocephalic, without obvious abnormality, atraumatic. Eyes: Conjunctivae clear, anicteric sclerae. Pupils are equal ENT Nares normal. No drainage or sinus tenderness. Lips, mucosa, and tongue normal. No Thrush Neck: Supple, symmetrical, no adenopathy, thyroid: non tender no carotid bruit and no JVD. Back: No CVA tenderness. Lungs: Clear to auscultation bilaterally. No Wheezing or Rhonchi. No rales. Heart: Regular rate and rhythm, no murmur, rub or gallop. Abdomen: Soft, non-tender,not distended. Bowel sounds normal. No masses Extremities: Right arm swelling resolved On examining the palm the surgical site is well-approximated with no erythema or discharge The dorsum of the hand the surgical site is well-approximated with sutures Minimal  maceration on the edge of the proximal part      Movement of the fingers better than before except the index finger which  is still slightly swollen and stiff Skin: No rashes or lesions. Or bruising Lymph: Cervical, supraclavicular normal. Neurologic: Grossly non-focal  ? Impression/Recommendation MRSA infection of the right hand.  Status post multiple debridement Patient is currently on linezolid 600 mg p.o. twice daily.  Dressing changed today.  The hand is looking much better . She has another week to go and she will complete 3 weeks on 04/21/2023 she follows with Ortho every week.  Next appointment is on Tuesday Today will do labs including CBC and CMP and CRP?  Diabetes mellitus on Farxiga and metformin. ___________________________________________________ Discussed with patient, and her husband. Follow-up as needed Will let her know once we have the results note:  This document was prepared using Dragon voice recognition software and may include unintentional dictation errors.

## 2023-04-15 NOTE — Progress Notes (Signed)
For this procedure, I would consider this to have been an excisional debridement as areas of questionable/necrotic tissue were removed along with the pus.  In addition, this dissection was carried into the submuscular region of her dorsal interossei muscles.  Thanks!

## 2023-04-15 NOTE — Progress Notes (Signed)
I would consider this to have been an excisional debridement as areas of questionable/necrotic tissue were removed along with the pus.  Thanks!

## 2023-04-18 ENCOUNTER — Telehealth: Payer: Self-pay

## 2023-04-18 ENCOUNTER — Other Ambulatory Visit: Payer: Self-pay

## 2023-04-18 DIAGNOSIS — A4902 Methicillin resistant Staphylococcus aureus infection, unspecified site: Secondary | ICD-10-CM

## 2023-04-18 NOTE — Telephone Encounter (Signed)
-----   Message from Lynn Ito sent at 04/15/2023 11:02 AM EDT ----- Can you please let her know that her labs are looking good. To continue linezolid and repeat labs next week . thx ----- Message ----- From: Leory Plowman, Lab In [place] Sent: 04/14/2023  12:47 PM EDT To: Lynn Ito, MD

## 2023-04-19 ENCOUNTER — Other Ambulatory Visit
Admission: RE | Admit: 2023-04-19 | Discharge: 2023-04-19 | Disposition: A | Discharge status: Home / Self Care | Payer: No Typology Code available for payment source | Attending: Infectious Diseases | Admitting: Infectious Diseases

## 2023-04-19 DIAGNOSIS — A4902 Methicillin resistant Staphylococcus aureus infection, unspecified site: Secondary | ICD-10-CM | POA: Insufficient documentation

## 2023-04-19 LAB — CBC WITH DIFFERENTIAL/PLATELET
Abs Immature Granulocytes: 0.01 10*3/uL (ref 0.00–0.07)
Basophils Absolute: 0.1 10*3/uL (ref 0.0–0.1)
Basophils Relative: 1 %
Eosinophils Absolute: 0.2 10*3/uL (ref 0.0–0.5)
Eosinophils Relative: 3 %
HCT: 35.3 % — ABNORMAL LOW (ref 36.0–46.0)
Hemoglobin: 11.2 g/dL — ABNORMAL LOW (ref 12.0–15.0)
Immature Granulocytes: 0 %
Lymphocytes Relative: 36 %
Lymphs Abs: 2.2 10*3/uL (ref 0.7–4.0)
MCH: 30.4 pg (ref 26.0–34.0)
MCHC: 31.7 g/dL (ref 30.0–36.0)
MCV: 95.7 fL (ref 80.0–100.0)
Monocytes Absolute: 0.6 10*3/uL (ref 0.1–1.0)
Monocytes Relative: 9 %
Neutro Abs: 3.1 10*3/uL (ref 1.7–7.7)
Neutrophils Relative %: 51 %
Platelets: 426 10*3/uL — ABNORMAL HIGH (ref 150–400)
RBC: 3.69 MIL/uL — ABNORMAL LOW (ref 3.87–5.11)
RDW: 15.3 % (ref 11.5–15.5)
WBC: 6.1 10*3/uL (ref 4.0–10.5)
nRBC: 0 % (ref 0.0–0.2)

## 2023-04-19 LAB — COMPREHENSIVE METABOLIC PANEL
ALT: 18 U/L (ref 0–44)
AST: 21 U/L (ref 15–41)
Albumin: 4.1 g/dL (ref 3.5–5.0)
Alkaline Phosphatase: 161 U/L — ABNORMAL HIGH (ref 38–126)
Anion gap: 13 (ref 5–15)
BUN: 17 mg/dL (ref 8–23)
CO2: 22 mmol/L (ref 22–32)
Calcium: 9.5 mg/dL (ref 8.9–10.3)
Chloride: 103 mmol/L (ref 98–111)
Creatinine, Ser: 1.13 mg/dL — ABNORMAL HIGH (ref 0.44–1.00)
GFR, Estimated: 55 mL/min — ABNORMAL LOW (ref >60)
Glucose, Bld: 164 mg/dL — ABNORMAL HIGH (ref 70–99)
Potassium: 3.7 mmol/L (ref 3.5–5.1)
Sodium: 138 mmol/L (ref 135–145)
Total Bilirubin: 0.6 mg/dL (ref 0.3–1.2)
Total Protein: 7.5 g/dL (ref 6.5–8.1)

## 2023-04-19 LAB — C-REACTIVE PROTEIN: CRP: 0.6 mg/dL (ref <1.0)

## 2023-04-19 NOTE — Addendum Note (Signed)
Addended by: Lonell Face C on: 04/19/2023 02:59 PM   Modules accepted: Orders

## 2023-04-25 ENCOUNTER — Encounter: Payer: Self-pay | Admitting: Occupational Therapy

## 2023-04-25 ENCOUNTER — Ambulatory Visit: Payer: No Typology Code available for payment source | Attending: Student | Admitting: Occupational Therapy

## 2023-04-25 DIAGNOSIS — M25631 Stiffness of right wrist, not elsewhere classified: Secondary | ICD-10-CM | POA: Diagnosis present

## 2023-04-25 DIAGNOSIS — R600 Localized edema: Secondary | ICD-10-CM | POA: Diagnosis present

## 2023-04-25 DIAGNOSIS — M25641 Stiffness of right hand, not elsewhere classified: Secondary | ICD-10-CM | POA: Diagnosis present

## 2023-04-25 DIAGNOSIS — M6281 Muscle weakness (generalized): Secondary | ICD-10-CM | POA: Diagnosis present

## 2023-04-25 DIAGNOSIS — L905 Scar conditions and fibrosis of skin: Secondary | ICD-10-CM | POA: Insufficient documentation

## 2023-04-25 NOTE — Therapy (Signed)
OUTPATIENT OCCUPATIONAL THERAPY ORTHO EVALUATION  Patient Name: Wanda Smith MRN: [id number] DOB:[birth year], 62 y.o., female Today's Date: 04/25/2023  PCP: Merlinda Frederick NP REFERRING PROVIDER: Marney Doctor PA  END OF SESSION:  OT End of Session - 04/25/23 1444     Visit Number 1    Number of Visits 16    Date for OT Re-Evaluation 06/20/23    OT Start Time 0820    OT Stop Time 0904    OT Time Calculation (min) 44 min    Activity Tolerance Patient tolerated treatment well    Behavior During Therapy WFL for tasks assessed/performed             Past Medical History:  Diagnosis Date   Anemia    Anxiety    Arthritis    knee (R)   Asthma    Chronic kidney disease    Stage 3 sees Lateef   COPD (chronic obstructive pulmonary disease) (HCC)    Depression    Diabetes mellitus without complication (HCC)    Dyspnea    GERD (gastroesophageal reflux disease)    occ   Hypertension    Hypothyroidism    Increased serum lipids    Wears contact lenses    Past Surgical History:  Procedure Laterality Date   ABDOMINAL HYSTERECTOMY     CARPAL TUNNEL RELEASE Right 07/26/2018   Procedure: CARPAL TUNNEL RELEASE ENDOSCOPIC;  Surgeon: Christena Flake, MD;  Location: [facility];  Service: Orthopedics;  Laterality: Right;  diabetic - oral meds   CHOLECYSTECTOMY     COLONOSCOPY WITH ESOPHAGOGASTRODUODENOSCOPY (EGD)     COLONOSCOPY WITH PROPOFOL N/A 01/13/2018   Procedure: COLONOSCOPY WITH PROPOFOL;  Surgeon: Scot Jun, MD;  Location: [facility];  Service: Endoscopy;  Laterality: N/A;   HERNIA REPAIR     I & D EXTREMITY Right 04/01/2023   Procedure: IRRIGATION AND DEBRIDEMENT EXTREMITY PREVENA PLACEMENT;  Surgeon: Christena Flake, MD;  Location: [facility];  Service: Orthopedics;  Laterality: Right;   INCISION AND DRAINAGE ABSCESS Right 03/30/2023   Procedure: INCISION AND DRAINAGE ABSCESS RIGHT HAND;  Surgeon: Christena Flake, MD;  Location: [facility];  Service: Orthopedics;   Laterality: Right;   INCISION AND DRAINAGE OF WOUND Right 04/04/2023   Procedure: IRRIGATION AND DEBRIDEMENT RIGHT HAND DORSAL AND PALMAR INCISIONS WITH DELAYED PRIMARY CLOSURE;  Surgeon: Christena Flake, MD;  Location: [facility];  Service: Orthopedics;  Laterality: Right;   left rotator cuff repair Left    SHOULDER ARTHROSCOPY WITH SUBACROMIAL DECOMPRESSION, ROTATOR CUFF REPAIR AND BICEP TENDON REPAIR Right 10/21/2021   Procedure: SHOULDER ARTHROSCOPY WITH DEBRIDEMENT, DECOMPRESSION, BICEPS TENODESIS.;  Surgeon: Christena Flake, MD;  Location: [facility];  Service: Orthopedics;  Laterality: Right;   TUBAL LIGATION     Patient Active Problem List   Diagnosis Date Noted   MRSA infection 04/04/2023   Abscess of right hand 03/31/2023   Cellulitis of right hand 03/30/2023   Depression with anxiety 03/30/2023   HLD (hyperlipidemia) 03/30/2023   Type II diabetes mellitus with renal manifestations (HCC) 03/30/2023   Chronic kidney disease, stage 3a (HCC) 03/30/2023   Abnormal LFTs 03/30/2023   Severe sepsis (HCC) 03/30/2023   Hypothyroidism    Hypertension    COPD (chronic obstructive pulmonary disease) (HCC)     ONSET DATE: 03/30/23  REFERRING DIAG:Abscess of R hand -3 irrigation surgeries  THERAPY DIAG:  Scar condition and fibrosis of skin  Localized edema  Stiffness of right hand, not elsewhere classified  Stiffness of  right wrist, not elsewhere classified  Muscle weakness (generalized)  Rationale for Evaluation and Treatment: Rehabilitation  SUBJECTIVE:   SUBJECTIVE STATEMENT: My pain is about a 5/10.  Just feels really tight into my forearm even.  Cannot really make a fist.  And to keep this bandage on all the time. Pt accompanied by: self  PERTINENT HISTORY:  Ortho note 04/20/23 Wanda Smith is a 62 y.o. female who presents today for a skin check status post multiple irrigation and debridements involving right dorsal and volar hand wounds and possible suture removal. The  patient was admitted to the hospital after suffering a dog scratch which eventually became more swollen and painful. The patient underwent a CT scan of the right hand which demonstrated a abscess formation along the volar aspect of the hand initially. She underwent 1 irrigation debridement however the patient continued to have swelling and pain prompting a repeat CT scan which demonstrated abscess formation along the dorsal aspect of the hand. The patient underwent a second irrigation debridement and a final third procedure performed by Dr. Joice Lofts on 04/04/2023. The patient was evaluated last week where skin inspection demonstrated excellent granulation without any significant erythema or purulent drainage. The patient then had a follow-up with infectious disease where repeat CRP came back at a normal level. The patient does still have a oral prescription for linezolid which she is scheduled to take until end this week. She denies any trauma or injury affecting the right hand since her last evaluation. She does report an occasional burning discomfort in the index finger. She is still taking occasional oxycodone. She reports a 0 out of 10 pain score at today's visit. Stitches remove- and to finish antibiotics and refer to OT   PRECAUTIONS: None      WEIGHT BEARING RESTRICTIONS: No  PAIN:  Are you having pain? 5/10 R hand into foreram  FALLS: Has patient fallen in last 6 months? No  LIVING ENVIRONMENT: Lives with: lives with their family and lives with their spouse   PLOF: Work on [employer], gym 3 x wk , do own cooking and housework  PATIENT GOALS: Get my motion and strength back in the right hand so that I can carry and grip and hold objects  NEXT MD VISIT: 21st Of Aug  OBJECTIVE:   HAND DOMINANCE: Right   UPPER EXTREMITY ROM:     Active ROM Right eval Left eval  Shoulder flexion    Shoulder abduction    Shoulder adduction    Shoulder extension    Shoulder internal rotation     Shoulder external rotation    Elbow flexion    Elbow extension    Wrist flexion 35 110  Wrist extension 48 85  Wrist ulnar deviation 24   Wrist radial deviation 20   Wrist pronation    Wrist supination    (Blank rows = not tested)  Active ROM Right eval Left eval  Thumb MCP (0-60) 70   Thumb IP (0-80) 35   Thumb Radial abd/add (0-55) 45    Thumb Palmar abd/add (0-45) 45    Thumb Opposition to Small Finger Opposition to 3rd, 4th and 5th     Index MCP (0-90) 30    Index PIP (0-100) 45( ext -30    Index DIP (0-70)      Long MCP (0-90)  45    Long PIP (0-100)  90( ext -20    Long DIP (0-70)      Ring MCP (0-90)  45    Ring PIP (0-100)  100 (ext -10    Ring DIP (0-70)      Little MCP (0-90)  75    Little PIP (0-100)  90 (ext 0    Little DIP (0-70)      (Blank rows = not tested)    COORDINATION: Unable -stiffness of all digits    EDEMA: R hand   COGNITION: Overall cognitive status: Within functional limits for tasks assessed    OBSERVATIONS:  Patient arrive with dry skin and scabs on volar incision as well as dorsal.  Removed several areas.  1 small area of scab on palmar side and 2 on dorsal side.  Patient can initiate scar massage on healed areas.  Provided patient with a Tubigrip D to wear during the day instead of Ace wrap.   TODAY'S TREATMENT:                                                                                                                              DATE: 04/25/23 Contrast done.  Patient to do 3 times a day recommended not soaking in water using heating and cold pack Followed by scar massage on healed areas Husband can assist with metacarpal sprites as well as webspace massage prior to range of motion As well as interlocking abduction adduction of digits 12 reps each Followed by blocked tendon glides for MC flexion, intrinsic a fist and composite fist to 4 cm foam block 12 reps active assisted range of motion if needed keeping pain under  2/10 Thumb palmar radial abduction 12 reps Opposition to all digits 5 reps focusing on IP and MC flexion Active assisted range of motion for wrist extension and flexion as well as radial ulnar deviation 12 reps pain-free   PATIENT EDUCATION: Education details: Findings of evaluation and home program Person educated: Patient Education method: Explanation, Demonstration, Tactile cues, Verbal cues, and Handouts Education comprehension: verbalized understanding, returned demonstration, verbal cues required, tactile cues required, and needs further education   GOALS: Goals reviewed with patient? Yes  SHORT TERM GOALS: Target date: 2 wks  Patient to be independent in home program to decrease scar tissue and edema - increased extension and flexion of digits as well as wrist Baseline: Severe stiffness in digits, increased scar tissue-scabs and dry skin removed this date-no knowledge of home program Goal status: INITIAL   LONG TERM GOALS: Target date: 8 wks Flexion of right digits improved for patient to touch palm to initiate use in ADLs with no increase symptoms Baseline: Dry skin and scabs removed today while -MC flexion 30 to 75 degrees, PIP 45 to 100 degrees-discomfort and pain 5/10 Goal status: INITIAL   2.  Patient increase extension of right digits 2nd thru 4th to be able to don gloves, wash face and reach aroundcylinder objects without issues Baseline: Patient arrived with -10 to -30 PIP extension,  Goal status: INITIAL   3.  Scar tissue improved  for patient to be able to make composite fist as well as increase extension and tolerate gripping objects Baseline: dry skin and scabs removed today; initiating scar massage but 3 scabs still in place,  unable to make composite fist as well as extension at PIPs -10 to -30 degrees Goal status: INITIAL   4.  Right grip and prehension strength increased to more than 75% compared to the left for patient to hold weight to work out and  cook Baseline: scabs removed and dry skin- 3 scabs still in place; flexion extension still limited, scar massage initiated on some areas 3 weeks postop Goal status: INITIAL   5.  R wrist AROM increase to WNL to push and pull door open without increase symptoms  Baseline:  wrist flxion 35 , ext 48 and pain 5/10 Goal status: INITIAL  ASSESSMENT:  CLINICAL IMPRESSION: Patient present today for occupational therapy evaluation post multiple irrigation and debridements involving right dorsal and volar hand wounds -patient underwent a CT scan of the right hand which demonstrated a abscess formation along the volar aspect of the hand initially. She underwent 1 irrigation debridement however the patient continued to have swelling and pain prompting a repeat CT scan which demonstrated abscess formation along the dorsal aspect of the hand. The patient underwent a second irrigation debridement and a final third procedure performed by Dr. Joice Lofts on 04/04/2023. Pt present today with scar tissue on the dorsal and volar hand.  Scabs and dry skin removed and patient added on scar mobilization at areas that is ready for scar mobilization.  Patient with extreme stiffness in the digits flexion and extension as well as wrist flexion extension.  Pain at rest 5/10 over dorsal and volar hand.  Patient can benefit from skilled OT services to decrease scar tissue and edema and increase range of motion in flexion and extension of digits as well as wrist, increase strength to return to prior level of function. PERFORMANCE DEFICITS: in functional skills including ADLs, IADLs, coordination, dexterity, edema, ROM, strength, pain, flexibility, and UE functional use,   and psychosocial skills including habits and routines and behaviors.   IMPAIRMENTS: are limiting patient from ADLs, IADLs, rest and sleep, work, play, leisure, and social participation.   COMORBIDITIES: has no other co-morbidities that affects occupational performance.  Patient will benefit from skilled OT to address above impairments and improve overall function.  MODIFICATION OR ASSISTANCE TO COMPLETE EVALUATION: No modification of tasks or assist necessary to complete an evaluation.  OT OCCUPATIONAL PROFILE AND HISTORY: Problem focused assessment: Including review of records relating to presenting problem.  CLINICAL DECISION MAKING: LOW - limited treatment options, no task modification necessary  REHAB POTENTIAL: Good  EVALUATION COMPLEXITY: Low      PLAN:  OT FREQUENCY: 2x/week  OT DURATION: 8 weeks  PLANNED INTERVENTIONS: ADL's therapeutic exercise, therapeutic activity, manual therapy, scar mobilization, passive range of motion, splinting, paraffin, fluidotherapy, contrast bath, patient/family education, and DME and/or AE instructions   CONSULTED AND AGREED WITH PLAN OF CARE: Patient     Oletta Cohn, OTR/L,CLT 04/25/2023, 2:47 PM

## 2023-04-28 ENCOUNTER — Encounter: Payer: Self-pay | Admitting: Infectious Diseases

## 2023-04-29 ENCOUNTER — Ambulatory Visit: Payer: No Typology Code available for payment source | Admitting: Occupational Therapy

## 2023-04-29 DIAGNOSIS — M25631 Stiffness of right wrist, not elsewhere classified: Secondary | ICD-10-CM

## 2023-04-29 DIAGNOSIS — L905 Scar conditions and fibrosis of skin: Secondary | ICD-10-CM | POA: Diagnosis not present

## 2023-04-29 DIAGNOSIS — R600 Localized edema: Secondary | ICD-10-CM

## 2023-04-29 DIAGNOSIS — M25641 Stiffness of right hand, not elsewhere classified: Secondary | ICD-10-CM

## 2023-04-29 DIAGNOSIS — M6281 Muscle weakness (generalized): Secondary | ICD-10-CM

## 2023-04-29 NOTE — Therapy (Signed)
OUTPATIENT OCCUPATIONAL THERAPY ORTHO TREATMENT  Patient Name: Wanda Smith MRN: [id number] DOB:1961/07/11, 62 y.o., female Today's Date: 04/29/2023  PCP: Merlinda Frederick NP REFERRING PROVIDER: Marney Doctor PA  END OF SESSION:  OT End of Session - 04/29/23 1425     Visit Number 2    Number of Visits 16    Date for OT Re-Evaluation 06/20/23    OT Start Time 0946    OT Stop Time 1033    OT Time Calculation (min) 47 min    Activity Tolerance Patient tolerated treatment well    Behavior During Therapy WFL for tasks assessed/performed             Past Medical History:  Diagnosis Date   Anemia    Anxiety    Arthritis    knee (R)   Asthma    Chronic kidney disease    Stage 3 sees Lateef   COPD (chronic obstructive pulmonary disease) (HCC)    Depression    Diabetes mellitus without complication (HCC)    Dyspnea    GERD (gastroesophageal reflux disease)    occ   Hypertension    Hypothyroidism    Increased serum lipids    Wears contact lenses    Past Surgical History:  Procedure Laterality Date   ABDOMINAL HYSTERECTOMY     CARPAL TUNNEL RELEASE Right 07/26/2018   Procedure: CARPAL TUNNEL RELEASE ENDOSCOPIC;  Surgeon: Christena Flake, MD;  Location: [facility];  Service: Orthopedics;  Laterality: Right;  diabetic - oral meds   CHOLECYSTECTOMY     COLONOSCOPY WITH ESOPHAGOGASTRODUODENOSCOPY (EGD)     COLONOSCOPY WITH PROPOFOL N/A 01/13/2018   Procedure: COLONOSCOPY WITH PROPOFOL;  Surgeon: Scot Jun, MD;  Location: [facility];  Service: Endoscopy;  Laterality: N/A;   HERNIA REPAIR     I & D EXTREMITY Right 04/01/2023   Procedure: IRRIGATION AND DEBRIDEMENT EXTREMITY PREVENA PLACEMENT;  Surgeon: Christena Flake, MD;  Location: [facility];  Service: Orthopedics;  Laterality: Right;   INCISION AND DRAINAGE ABSCESS Right 03/30/2023   Procedure: INCISION AND DRAINAGE ABSCESS RIGHT HAND;  Surgeon: Christena Flake, MD;  Location: [facility];  Service: Orthopedics;   Laterality: Right;   INCISION AND DRAINAGE OF WOUND Right 04/04/2023   Procedure: IRRIGATION AND DEBRIDEMENT RIGHT HAND DORSAL AND PALMAR INCISIONS WITH DELAYED PRIMARY CLOSURE;  Surgeon: Christena Flake, MD;  Location: [facility];  Service: Orthopedics;  Laterality: Right;   left rotator cuff repair Left    SHOULDER ARTHROSCOPY WITH SUBACROMIAL DECOMPRESSION, ROTATOR CUFF REPAIR AND BICEP TENDON REPAIR Right 10/21/2021   Procedure: SHOULDER ARTHROSCOPY WITH DEBRIDEMENT, DECOMPRESSION, BICEPS TENODESIS.;  Surgeon: Christena Flake, MD;  Location: [facility];  Service: Orthopedics;  Laterality: Right;   TUBAL LIGATION     Patient Active Problem List   Diagnosis Date Noted   MRSA infection 04/04/2023   Abscess of right hand 03/31/2023   Cellulitis of right hand 03/30/2023   Depression with anxiety 03/30/2023   HLD (hyperlipidemia) 03/30/2023   Type II diabetes mellitus with renal manifestations (HCC) 03/30/2023   Chronic kidney disease, stage 3a (HCC) 03/30/2023   Abnormal LFTs 03/30/2023   Severe sepsis (HCC) 03/30/2023   Hypothyroidism    Hypertension    COPD (chronic obstructive pulmonary disease) (HCC)     ONSET DATE: 03/30/23  REFERRING DIAG:Abscess of R hand -3 irrigation surgeries  THERAPY DIAG:  Scar condition and fibrosis of skin  Localized edema  Stiffness of right hand, not elsewhere classified  Stiffness  of right wrist, not elsewhere classified  Muscle weakness (generalized)  Rationale for Evaluation and Treatment: Rehabilitation  SUBJECTIVE:   SUBJECTIVE STATEMENT: I do not really have pain when he is sitting.  Just feels tight.  Pain increase some when start moving.  My index finger at the worst.  Feel like the swelling is a little bit better but it increases when I do my exercises  pt accompanied by: self  PERTINENT HISTORY:  Ortho note 04/20/23 Wanda Smith is a 62 y.o. female who presents today for a skin check status post multiple irrigation and debridements  involving right dorsal and volar hand wounds and possible suture removal. The patient was admitted to the hospital after suffering a dog scratch which eventually became more swollen and painful. The patient underwent a CT scan of the right hand which demonstrated a abscess formation along the volar aspect of the hand initially. She underwent 1 irrigation debridement however the patient continued to have swelling and pain prompting a repeat CT scan which demonstrated abscess formation along the dorsal aspect of the hand. The patient underwent a second irrigation debridement and a final third procedure performed by Dr. Joice Lofts on 04/04/2023. The patient was evaluated last week where skin inspection demonstrated excellent granulation without any significant erythema or purulent drainage. The patient then had a follow-up with infectious disease where repeat CRP came back at a normal level. The patient does still have a oral prescription for linezolid which she is scheduled to take until end this week. She denies any trauma or injury affecting the right hand since her last evaluation. She does report an occasional burning discomfort in the index finger. She is still taking occasional oxycodone. She reports a 0 out of 10 pain score at today's visit. Stitches remove- and to finish antibiotics and refer to OT   PRECAUTIONS: None      WEIGHT BEARING RESTRICTIONS: No  PAIN:  Are you having pain? 5/10 R hand into foreram  FALLS: Has patient fallen in last 6 months? No  LIVING ENVIRONMENT: Lives with: lives with their family and lives with their spouse   PLOF: Work on [employer], gym 3 x wk , do own cooking and housework  PATIENT GOALS: Get my motion and strength back in the right hand so that I can carry and grip and hold objects  NEXT MD VISIT: 21st Of Aug  OBJECTIVE:   HAND DOMINANCE: Right   UPPER EXTREMITY ROM:     Active ROM Right eval Left eval R  04/29/23  Shoulder flexion     Shoulder  abduction     Shoulder adduction     Shoulder extension     Shoulder internal rotation     Shoulder external rotation     Elbow flexion     Elbow extension     Wrist flexion 35 110 50  Wrist extension 48 85 60  Wrist ulnar deviation 24    Wrist radial deviation 20    Wrist pronation     Wrist supination     (Blank rows = not tested)  Active ROM Right eval Left eval R   04/29/23  Thumb MCP (0-60) 70    Thumb IP (0-80) 35    Thumb Radial abd/add (0-55) 45     Thumb Palmar abd/add (0-45) 45     Thumb Opposition to Small Finger Opposition to 3rd, 4th and 5th      Index MCP (0-90) 30   50  Index PIP (0-100)  45( ext -30   55 ( ext -20  Index DIP (0-70)       Long MCP (0-90)  45   50  Long PIP (0-100)  90( ext -20   95 ( ext -15  Long DIP (0-70)       Ring MCP (0-90)  45   60  Ring PIP (0-100)  100 (ext -10   100( ext 0  Ring DIP (0-70)       Little MCP (0-90)  75   75  Little PIP (0-100)  90 (ext 0   100 ( ext 0  Little DIP (0-70)       (Blank rows = not tested)    COORDINATION: Unable -stiffness of all digits    EDEMA: R hand   COGNITION: Overall cognitive status: Within functional limits for tasks assessed    OBSERVATIONS:  Patient arrive with volar and dorsal car appropriate to start scar massage.  1 area at distal palmar crease still with half a centimeter scar.  Patient fitted with Isotoner glove.  To wear during the day is much as he can.  As well as a silicone sleeve for daytime use for second digit.  Provided patient with a Tubigrip D to wear if not wearing compression glove.  TODAY'S TREATMENT:                                                                                                                              DATE:  04/29/23 Patient arrive with decreased swelling as well as increase extension of flexion of digits.  Patient do report increased volar wrist pain.  Contrast done.  Patient to do 3 times a day recommended not soaking in water using heating  and cold pack Done extended scar massage by OT manually as well as using mini massager over C[employer] -Care scar pad on volar second digit and second and third with space as well as volar scar and dorsal scar on hand.  Patient educated on scar massage.   Provided with new Cica -Care scar pad for dorsal and volar scars and silicone sleeve for second digit to wear on and off during the day  Provided patient with red foam roller to do soft tissue massage rolling palm 20 reps prior to range of motion  Metacarpal sprites as well as webspace massage done by OT husband can assist with metacarpal sprites as well as webspace massage prior to range of motion Patient to continue with interlocking abduction adduction of digits 12 reps each OT focus this date on second webspace of index and third digit  Focus on second DIP PIP flexion followed by passive range of motion for intrinsic digits flexion 2nd through 5th and patient educated As well as passive range of motion metacarpal flexion to 70 to 90 degrees prior to composite flexion. Followed by blocked tendon glides for MC flexion, intrinsic a fist and composite fist to 4 cm foam block 12  reps active assisted range of motion if needed keeping pain under 2/10 Thumb palmar radial abduction 12 reps Opposition to all digits 5 reps focusing on IP and MC flexion Change wrist exercises to passive range of motion and active assisted range of motion for flexion and extension and radial and ulnar deviation over the edge of the table  12 reps pain-free  Patient to focus on when working on digit flexion- keeping wrist straight.      04/25/23 Contrast done.  Patient to do 3 times a day recommended not soaking in water using heating and cold pack Followed by scar massage on healed areas Husband can assist with metacarpal sprites as well as webspace massage prior to range of motion As well as interlocking abduction adduction of digits 12 reps each Followed by blocked  tendon glides for MC flexion, intrinsic a fist and composite fist to 4 cm foam block 12 reps active assisted range of motion if needed keeping pain under 2/10 Thumb palmar radial abduction 12 reps Opposition to all digits 5 reps focusing on IP and MC flexion Active assisted range of motion for wrist extension and flexion as well as radial ulnar deviation 12 reps pain-free   PATIENT EDUCATION: Education details: Findings of evaluation and home program Person educated: Patient Education method: Explanation, Demonstration, Tactile cues, Verbal cues, and Handouts Education comprehension: verbalized understanding, returned demonstration, verbal cues required, tactile cues required, and needs further education   GOALS: Goals reviewed with patient? Yes  SHORT TERM GOALS: Target date: 2 wks  Patient to be independent in home program to decrease scar tissue and edema - increased extension and flexion of digits as well as wrist Baseline: Severe stiffness in digits, increased scar tissue-scabs and dry skin removed this date-no knowledge of home program Goal status: INITIAL   LONG TERM GOALS: Target date: 8 wks Flexion of right digits improved for patient to touch palm to initiate use in ADLs with no increase symptoms Baseline: Dry skin and scabs removed today while -MC flexion 30 to 75 degrees, PIP 45 to 100 degrees-discomfort and pain 5/10 Goal status: INITIAL   2.  Patient increase extension of right digits 2nd thru 4th to be able to don gloves, wash face and reach aroundcylinder objects without issues Baseline: Patient arrived with -10 to -30 PIP extension,  Goal status: INITIAL   3.  Scar tissue improved for patient to be able to make composite fist as well as increase extension and tolerate gripping objects Baseline: dry skin and scabs removed today; initiating scar massage but 3 scabs still in place,  unable to make composite fist as well as extension at PIPs -10 to -30 degrees Goal  status: INITIAL   4.  Right grip and prehension strength increased to more than 75% compared to the left for patient to hold weight to work out and cook Baseline: scabs removed and dry skin- 3 scabs still in place; flexion extension still limited, scar massage initiated on some areas 3 weeks postop Goal status: INITIAL   5.  R wrist AROM increase to WNL to push and pull door open without increase symptoms  Baseline:  wrist flxion 35 , ext 48 and pain 5/10 Goal status: INITIAL  ASSESSMENT:  CLINICAL IMPRESSION: Patient present at occupational therapy evaluation post multiple irrigation and debridements involving right dorsal and volar hand wounds -patient underwent a CT scan of the right hand which demonstrated a abscess formation along the volar aspect of the hand initially. She underwent 1  irrigation debridement however the patient continued to have swelling and pain prompting a repeat CT scan which demonstrated abscess formation along the dorsal aspect of the hand. The patient underwent a second irrigation debridement and a final third procedure performed by Dr. Joice Lofts on 04/04/2023.  Patient presented for follow-up post evaluation with continued severe scar tissue and tightness in the volar and dorsal hand.  Limiting second and third digits mostly.  Extended scar massage done as well as reviewed with patient.  Patient also fitted with Isotoner glove to be wore in between exercises and if needed at nighttime.  Added passive range of motion to digits with a focus on keeping her wrist neutral during digit flexion.  Changed wrist exercises to active assisted range and passive range of motion of the edge of table to decrease pain on volar wrist.  Pain at rest this date 0-1/10.  Mostly report severe stiffness and tightness.  Patient can benefit from skilled OT services to decrease scar tissue and edema and increase range of motion in flexion and extension of digits as well as wrist, increase strength to  return to prior level of function. PERFORMANCE DEFICITS: in functional skills including ADLs, IADLs, coordination, dexterity, edema, ROM, strength, pain, flexibility, and UE functional use,   and psychosocial skills including habits and routines and behaviors.   IMPAIRMENTS: are limiting patient from ADLs, IADLs, rest and sleep, work, play, leisure, and social participation.   COMORBIDITIES: has no other co-morbidities that affects occupational performance. Patient will benefit from skilled OT to address above impairments and improve overall function.  MODIFICATION OR ASSISTANCE TO COMPLETE EVALUATION: No modification of tasks or assist necessary to complete an evaluation.  OT OCCUPATIONAL PROFILE AND HISTORY: Problem focused assessment: Including review of records relating to presenting problem.  CLINICAL DECISION MAKING: LOW - limited treatment options, no task modification necessary  REHAB POTENTIAL: Good  EVALUATION COMPLEXITY: Low      PLAN:  OT FREQUENCY: 2x/week  OT DURATION: 8 weeks  PLANNED INTERVENTIONS: ADL's therapeutic exercise, therapeutic activity, manual therapy, scar mobilization, passive range of motion, splinting, paraffin, fluidotherapy, contrast bath, patient/family education, and DME and/or AE instructions   CONSULTED AND AGREED WITH PLAN OF CARE: Patient     Oletta Cohn, OTR/L,CLT 04/29/2023, 2:26 PM

## 2023-05-02 ENCOUNTER — Ambulatory Visit: Payer: No Typology Code available for payment source | Admitting: Occupational Therapy

## 2023-05-03 ENCOUNTER — Ambulatory Visit: Payer: No Typology Code available for payment source | Admitting: Occupational Therapy

## 2023-05-03 DIAGNOSIS — M6281 Muscle weakness (generalized): Secondary | ICD-10-CM

## 2023-05-03 DIAGNOSIS — M25641 Stiffness of right hand, not elsewhere classified: Secondary | ICD-10-CM

## 2023-05-03 DIAGNOSIS — R600 Localized edema: Secondary | ICD-10-CM

## 2023-05-03 DIAGNOSIS — M25631 Stiffness of right wrist, not elsewhere classified: Secondary | ICD-10-CM

## 2023-05-03 DIAGNOSIS — L905 Scar conditions and fibrosis of skin: Secondary | ICD-10-CM | POA: Diagnosis not present

## 2023-05-04 ENCOUNTER — Encounter: Payer: Self-pay | Admitting: Occupational Therapy

## 2023-05-04 NOTE — Therapy (Signed)
OUTPATIENT OCCUPATIONAL THERAPY ORTHO TREATMENT  Patient Name: Wanda Smith MRN: [id number] DOB:1960/11/06, 62 y.o., female   PCP: Wanda Frederick NP REFERRING PROVIDER: Marney Doctor PA  END OF SESSION:  OT End of Session - 05/04/23 1555     Visit Number 3    Number of Visits 16    Date for OT Re-Evaluation 06/20/23    OT Start Time 0900    OT Stop Time 0944    OT Time Calculation (min) 44 min    Activity Tolerance Patient tolerated treatment well    Behavior During Therapy WFL for tasks assessed/performed             Past Medical History:  Diagnosis Date   Anemia    Anxiety    Arthritis    knee (R)   Asthma    Chronic kidney disease    Stage 3 sees Wanda Smith   COPD (chronic obstructive pulmonary disease) (HCC)    Depression    Diabetes mellitus without complication (HCC)    Dyspnea    GERD (gastroesophageal reflux disease)    occ   Hypertension    Hypothyroidism    Increased serum lipids    Wears contact lenses    Past Surgical History:  Procedure Laterality Date   ABDOMINAL HYSTERECTOMY     CARPAL TUNNEL RELEASE Right 07/26/2018   Procedure: CARPAL TUNNEL RELEASE ENDOSCOPIC;  Surgeon: Wanda Flake, MD;  Location: [facility];  Service: Orthopedics;  Laterality: Right;  diabetic - oral meds   CHOLECYSTECTOMY     COLONOSCOPY WITH ESOPHAGOGASTRODUODENOSCOPY (EGD)     COLONOSCOPY WITH PROPOFOL N/A 01/13/2018   Procedure: COLONOSCOPY WITH PROPOFOL;  Surgeon: Wanda Jun, MD;  Location: [facility];  Service: Endoscopy;  Laterality: N/A;   HERNIA REPAIR     I & D EXTREMITY Right 04/01/2023   Procedure: IRRIGATION AND DEBRIDEMENT EXTREMITY PREVENA PLACEMENT;  Surgeon: Wanda Flake, MD;  Location: [facility];  Service: Orthopedics;  Laterality: Right;   INCISION AND DRAINAGE ABSCESS Right 03/30/2023   Procedure: INCISION AND DRAINAGE ABSCESS RIGHT HAND;  Surgeon: Wanda Flake, MD;  Location: [facility];  Service: Orthopedics;  Laterality: Right;    INCISION AND DRAINAGE OF WOUND Right 04/04/2023   Procedure: IRRIGATION AND DEBRIDEMENT RIGHT HAND DORSAL AND PALMAR INCISIONS WITH DELAYED PRIMARY CLOSURE;  Surgeon: Wanda Flake, MD;  Location: [facility];  Service: Orthopedics;  Laterality: Right;   left rotator cuff repair Left    SHOULDER ARTHROSCOPY WITH SUBACROMIAL DECOMPRESSION, ROTATOR CUFF REPAIR AND BICEP TENDON REPAIR Right 10/21/2021   Procedure: SHOULDER ARTHROSCOPY WITH DEBRIDEMENT, DECOMPRESSION, BICEPS TENODESIS.;  Surgeon: Wanda Flake, MD;  Location: [facility];  Service: Orthopedics;  Laterality: Right;   TUBAL LIGATION     Patient Active Problem List   Diagnosis Date Noted   MRSA infection 04/04/2023   Abscess of right hand 03/31/2023   Cellulitis of right hand 03/30/2023   Depression with anxiety 03/30/2023   HLD (hyperlipidemia) 03/30/2023   Type II diabetes mellitus with renal manifestations (HCC) 03/30/2023   Chronic kidney disease, stage 3a (HCC) 03/30/2023   Abnormal LFTs 03/30/2023   Severe sepsis (HCC) 03/30/2023   Hypothyroidism    Hypertension    COPD (chronic obstructive pulmonary disease) (HCC)     ONSET DATE: 03/30/23  REFERRING DIAG:Abscess of R hand -3 irrigation surgeries  THERAPY DIAG:  Muscle weakness (generalized)  Scar condition and fibrosis of skin  Localized edema  Stiffness of right hand, not elsewhere classified  Stiffness of right wrist, not elsewhere classified  Rationale for Evaluation and Treatment: Rehabilitation  SUBJECTIVE:   SUBJECTIVE STATEMENT: Pt reports tight feeling but not really pain in the right hand today.  Has been doing contrast at home.  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   pt accompanied by: self  PERTINENT HISTORY:  Ortho note 04/20/23 Wanda Smith is a 62 y.o. female who presents today for a skin check status post multiple irrigation and debridements involving right dorsal and volar hand wounds and possible suture removal. The patient  was admitted to the hospital after suffering a dog scratch which eventually became more swollen and painful. The patient underwent a CT scan of the right hand which demonstrated a abscess formation along the volar aspect of the hand initially. She underwent 1 irrigation debridement however the patient continued to have swelling and pain prompting a repeat CT scan which demonstrated abscess formation along the dorsal aspect of the hand. The patient underwent a second irrigation debridement and a final third procedure performed by Wanda Smith on 04/04/2023. The patient was evaluated last week where skin inspection demonstrated excellent granulation without any significant erythema or purulent drainage. The patient then had a follow-up with infectious disease where repeat CRP came back at a normal level. The patient does still have a oral prescription for linezolid which she is scheduled to take until end this week. She denies any trauma or injury affecting the right hand since her last evaluation. She does report an occasional burning discomfort in the index finger. She is still taking occasional oxycodone. She reports a 0 out of 10 pain score at today's visit. Stitches remove- and to finish antibiotics and refer to OT   PRECAUTIONS: None      WEIGHT BEARING RESTRICTIONS: No  PAIN:  Are you having pain? 0/10 R hand into forearm but has a tight feeling   FALLS: Has patient fallen in last 6 months? No  LIVING ENVIRONMENT: Lives with: lives with their family and lives with their spouse   PLOF: Work on [employer], gym 3 x wk , do own cooking and housework  PATIENT GOALS: Get my motion and strength back in the right hand so that I can carry and grip and hold objects  NEXT MD VISIT: 21st Of Aug  OBJECTIVE:   HAND DOMINANCE: Right   UPPER EXTREMITY ROM:     Active ROM Right eval Left eval R  04/29/23 05/03/23  Shoulder flexion      Shoulder abduction      Shoulder adduction      Shoulder  extension      Shoulder internal rotation      Shoulder external rotation      Elbow flexion      Elbow extension      Wrist flexion 35 110 50 50  Wrist extension 48 85 60 55  Wrist ulnar deviation 24     Wrist radial deviation 20     Wrist pronation      Wrist supination      (Blank rows = not tested)  Active ROM Right eval Left eval R   04/29/23  Thumb MCP (0-60) 70    Thumb IP (0-80) 35    Thumb Radial abd/add (0-55) 45     Thumb Palmar abd/add (0-45) 45     Thumb Opposition to Small Finger Opposition to 3rd, 4th and 5th      Index MCP (0-90) 30   50  Index  PIP (0-100) 45( ext -30   55 ( ext -20  Index DIP (0-70)       Long MCP (0-90)  45   50  Long PIP (0-100)  90( ext -20   95 ( ext -15  Long DIP (0-70)       Ring MCP (0-90)  45   60  Ring PIP (0-100)  100 (ext -10   100( ext 0  Ring DIP (0-70)       Little MCP (0-90)  75   75  Little PIP (0-100)  90 (ext 0   100 ( ext 0  Little DIP (0-70)       (Blank rows = not tested)    COORDINATION: Unable -stiffness of all digits    EDEMA: R hand   COGNITION: Overall cognitive status: Within functional limits for tasks assessed    OBSERVATIONS:  Patient arrive with volar and dorsal car appropriate to start scar massage.  1 area at distal palmar crease still with half a centimeter scar.  Patient fitted with Isotoner glove.  To wear during the day is much as she can.  As well as a silicone sleeve for daytime use for second digit.  Provided patient with a Tubigrip D to wear if not wearing compression glove.  TODAY'S TREATMENT:                                                                                                                              DATE:  05/04/23   Contrast:  Pt seen for contrast to hand and wrist for 8 mins total to decrease pain and edema, ending with warm.  Patient to perform 3 times a day, using heating and cold pack  Manual:   Following contrast, pt seen for manual therapy for scar massage with  manual techniques as well as use of mini massager over C[employer] -Care scar pad on volar second digit and second and third, web space as well as volar scar and dorsal scar on hand.  Patient educated on scar massage and encouraged to perform multiple times per day.  Metacarpal and carpal spreads as well as webspace massage performed by OT. Pt has C[employer] -Care scar pad for dorsal and volar scars and silicone sleeve to wear on and off during the day   Therapeutic Exercises:  Patient utilizing red foam roller to perform soft tissue massage rolling palm 20 reps prior to range of motion  Patient to continue with interlocking abduction adduction of digits 12 reps each Additional focus on opening second webspace of index and third digit Second digit DIP PIP flexion followed by passive range of motion for intrinsic digits flexion 2nd through 5th  Passive range of motion metacarpal flexion to 70 to 90 degrees prior to composite flexion. Blocked tendon glides for MC flexion, intrinsic a fist and composite fist to 4 cm foam block 12 reps active assisted range of motion if needed keeping pain under  2/10 Thumb palmar radial abduction 12 reps  AAROM Opposition to all digits 5 reps focusing on IP and MC flexion, pt reports she has been trying to focus on oval or "O" with opposition Wrist exercise passive range of motion and active assisted range of motion for flexion and extension and radial and ulnar deviation over the edge of the table  12 reps pain-free  Patient to continue to focus on working on digit flexion- keeping wrist straight.  PATIENT EDUCATION: Education details: Findings of evaluation and home program Person educated: Patient Education method: Explanation, Demonstration, Tactile cues, Verbal cues, and Handouts Education comprehension: verbalized understanding, returned demonstration, verbal cues required, tactile cues required, and needs further education   GOALS: Goals reviewed with patient?  Yes  SHORT TERM GOALS: Target date: 2 wks  Patient to be independent in home program to decrease scar tissue and edema - increased extension and flexion of digits as well as wrist Baseline: Severe stiffness in digits, increased scar tissue-scabs and dry skin removed this date-no knowledge of home program Goal status: INITIAL   LONG TERM GOALS: Target date: 8 wks Flexion of right digits improved for patient to touch palm to initiate use in ADLs with no increase symptoms Baseline: Dry skin and scabs removed today while -MC flexion 30 to 75 degrees, PIP 45 to 100 degrees-discomfort and pain 5/10 Goal status: INITIAL   2.  Patient increase extension of right digits 2nd thru 4th to be able to don gloves, wash face and reach aroundcylinder objects without issues Baseline: Patient arrived with -10 to -30 PIP extension,  Goal status: INITIAL   3.  Scar tissue improved for patient to be able to make composite fist as well as increase extension and tolerate gripping objects Baseline: dry skin and scabs removed today; initiating scar massage but 3 scabs still in place,  unable to make composite fist as well as extension at PIPs -10 to -30 degrees Goal status: INITIAL   4.  Right grip and prehension strength increased to more than 75% compared to the left for patient to hold weight to work out and cook Baseline: scabs removed and dry skin- 3 scabs still in place; flexion extension still limited, scar massage initiated on some areas 3 weeks postop Goal status: INITIAL   5.  R wrist AROM increase to WNL to push and pull door open without increase symptoms  Baseline:  wrist flxion 35 , ext 48 and pain 5/10 Goal status: INITIAL  ASSESSMENT:  CLINICAL IMPRESSION: Patient presents at occupational therapy evaluation post multiple irrigation and debridements involving right dorsal and volar hand wounds -patient underwent a CT scan of the right hand which demonstrated a abscess formation along the volar  aspect of the hand initially. She underwent 1 irrigation debridement however the patient continued to have swelling and pain prompting a repeat CT scan which demonstrated abscess formation along the dorsal aspect of the hand. The patient underwent a second irrigation debridement and a final third procedure performed by Wanda Smith on 04/04/2023.  Patient presented for follow-up post evaluation with continued severe scar tissue and tightness in the volar and dorsal hand.  Limiting second and third digits mostly.   Patient fitted with Isotoner glove to be worn in between exercises and if needed at nighttime.  Passive range of motion to digits with a focus on keeping her wrist neutral during digit flexion. Wrist exercises active assisted range and passive range of motion of the edge of table to decrease pain  on volar wrist.  Pain at rest this date 0/10 but reports stiffness and tightness. Pt performing well with scar massage and home program, decreased pain overall and progressing well with ROM but remains with increased stiffness and tightness which limits function.  Patient can benefit from skilled OT services to decrease scar tissue and edema and increase range of motion in flexion and extension of digits as well as wrist, increase strength to return to prior level of function. PERFORMANCE DEFICITS: in functional skills including ADLs, IADLs, coordination, dexterity, edema, ROM, strength, pain, flexibility, and UE functional use,   and psychosocial skills including habits and routines and behaviors.   IMPAIRMENTS: are limiting patient from ADLs, IADLs, rest and sleep, work, play, leisure, and social participation.   COMORBIDITIES: has no other co-morbidities that affects occupational performance. Patient will benefit from skilled OT to address above impairments and improve overall function.  MODIFICATION OR ASSISTANCE TO COMPLETE EVALUATION: No modification of tasks or assist necessary to complete an  evaluation.  OT OCCUPATIONAL PROFILE AND HISTORY: Problem focused assessment: Including review of records relating to presenting problem.  CLINICAL DECISION MAKING: LOW - limited treatment options, no task modification necessary  REHAB POTENTIAL: Good  EVALUATION COMPLEXITY: Low  PLAN:  OT FREQUENCY: 2x/week  OT DURATION: 8 weeks  PLANNED INTERVENTIONS: ADL's therapeutic exercise, therapeutic activity, manual therapy, scar mobilization, passive range of motion, splinting, paraffin, fluidotherapy, contrast bath, patient/family education, and DME and/or AE instructions  CONSULTED AND AGREED WITH PLAN OF CARE: Patient   Camille Thau, OTR/L,CLT 05/04/2023, 3:56 PM

## 2023-05-05 ENCOUNTER — Ambulatory Visit: Payer: No Typology Code available for payment source | Admitting: Occupational Therapy

## 2023-05-05 DIAGNOSIS — M25631 Stiffness of right wrist, not elsewhere classified: Secondary | ICD-10-CM

## 2023-05-05 DIAGNOSIS — M6281 Muscle weakness (generalized): Secondary | ICD-10-CM

## 2023-05-05 DIAGNOSIS — L905 Scar conditions and fibrosis of skin: Secondary | ICD-10-CM

## 2023-05-05 DIAGNOSIS — M25641 Stiffness of right hand, not elsewhere classified: Secondary | ICD-10-CM

## 2023-05-05 DIAGNOSIS — R600 Localized edema: Secondary | ICD-10-CM

## 2023-05-06 ENCOUNTER — Encounter: Payer: Self-pay | Admitting: Occupational Therapy

## 2023-05-06 NOTE — Therapy (Unsigned)
OUTPATIENT OCCUPATIONAL THERAPY ORTHO TREATMENT  Patient Name: Wanda Smith MRN: [id number] DOB:03/16/1961, 62 y.o., female   PCP: Merlinda Frederick NP REFERRING PROVIDER: Marney Doctor PA  END OF SESSION:  OT End of Session - 05/06/23 2139     Visit Number 4    Number of Visits 16    Date for OT Re-Evaluation 06/20/23    OT Start Time 0900    OT Stop Time 0945    OT Time Calculation (min) 45 min    Activity Tolerance Patient tolerated treatment well    Behavior During Therapy WFL for tasks assessed/performed             Past Medical History:  Diagnosis Date   Anemia    Anxiety    Arthritis    knee (R)   Asthma    Chronic kidney disease    Stage 3 sees Lateef   COPD (chronic obstructive pulmonary disease) (HCC)    Depression    Diabetes mellitus without complication (HCC)    Dyspnea    GERD (gastroesophageal reflux disease)    occ   Hypertension    Hypothyroidism    Increased serum lipids    Wears contact lenses    Past Surgical History:  Procedure Laterality Date   ABDOMINAL HYSTERECTOMY     CARPAL TUNNEL RELEASE Right 07/26/2018   Procedure: CARPAL TUNNEL RELEASE ENDOSCOPIC;  Surgeon: Christena Flake, MD;  Location: [facility];  Service: Orthopedics;  Laterality: Right;  diabetic - oral meds   CHOLECYSTECTOMY     COLONOSCOPY WITH ESOPHAGOGASTRODUODENOSCOPY (EGD)     COLONOSCOPY WITH PROPOFOL N/A 01/13/2018   Procedure: COLONOSCOPY WITH PROPOFOL;  Surgeon: Scot Jun, MD;  Location: [facility];  Service: Endoscopy;  Laterality: N/A;   HERNIA REPAIR     I & D EXTREMITY Right 04/01/2023   Procedure: IRRIGATION AND DEBRIDEMENT EXTREMITY PREVENA PLACEMENT;  Surgeon: Christena Flake, MD;  Location: [facility];  Service: Orthopedics;  Laterality: Right;   INCISION AND DRAINAGE ABSCESS Right 03/30/2023   Procedure: INCISION AND DRAINAGE ABSCESS RIGHT HAND;  Surgeon: Christena Flake, MD;  Location: [facility];  Service: Orthopedics;  Laterality: Right;    INCISION AND DRAINAGE OF WOUND Right 04/04/2023   Procedure: IRRIGATION AND DEBRIDEMENT RIGHT HAND DORSAL AND PALMAR INCISIONS WITH DELAYED PRIMARY CLOSURE;  Surgeon: Christena Flake, MD;  Location: [facility];  Service: Orthopedics;  Laterality: Right;   left rotator cuff repair Left    SHOULDER ARTHROSCOPY WITH SUBACROMIAL DECOMPRESSION, ROTATOR CUFF REPAIR AND BICEP TENDON REPAIR Right 10/21/2021   Procedure: SHOULDER ARTHROSCOPY WITH DEBRIDEMENT, DECOMPRESSION, BICEPS TENODESIS.;  Surgeon: Christena Flake, MD;  Location: [facility];  Service: Orthopedics;  Laterality: Right;   TUBAL LIGATION     Patient Active Problem List   Diagnosis Date Noted   MRSA infection 04/04/2023   Abscess of right hand 03/31/2023   Cellulitis of right hand 03/30/2023   Depression with anxiety 03/30/2023   HLD (hyperlipidemia) 03/30/2023   Type II diabetes mellitus with renal manifestations (HCC) 03/30/2023   Chronic kidney disease, stage 3a (HCC) 03/30/2023   Abnormal LFTs 03/30/2023   Severe sepsis (HCC) 03/30/2023   Hypothyroidism    Hypertension    COPD (chronic obstructive pulmonary disease) (HCC)     ONSET DATE: 03/30/23  REFERRING DIAG:Abscess of R hand -3 irrigation surgeries  THERAPY DIAG:  Scar condition and fibrosis of skin  Localized edema  Stiffness of right hand, not elsewhere classified  Stiffness of right  wrist, not elsewhere classified  Muscle weakness (generalized)  Rationale for Evaluation and Treatment: Rehabilitation  SUBJECTIVE:   SUBJECTIVE STATEMENT: Pt reports she is doing well today  pt accompanied by: self  PERTINENT HISTORY:  Ortho note 04/20/23 Wanda Smith is a 62 y.o. female who presents today for a skin check status post multiple irrigation and debridements involving right dorsal and volar hand wounds and possible suture removal. The patient was admitted to the hospital after suffering a dog scratch which eventually became more swollen and painful. The patient  underwent a CT scan of the right hand which demonstrated a abscess formation along the volar aspect of the hand initially. She underwent 1 irrigation debridement however the patient continued to have swelling and pain prompting a repeat CT scan which demonstrated abscess formation along the dorsal aspect of the hand. The patient underwent a second irrigation debridement and a final third procedure performed by Dr. Joice Lofts on 04/04/2023. The patient was evaluated last week where skin inspection demonstrated excellent granulation without any significant erythema or purulent drainage. The patient then had a follow-up with infectious disease where repeat CRP came back at a normal level. The patient does still have a oral prescription for linezolid which she is scheduled to take until end this week. She denies any trauma or injury affecting the right hand since her last evaluation. She does report an occasional burning discomfort in the index finger. She is still taking occasional oxycodone. She reports a 0 out of 10 pain score at today's visit. Stitches remove- and to finish antibiotics and refer to OT   PRECAUTIONS: None      WEIGHT BEARING RESTRICTIONS: No  PAIN:  Are you having pain? 0/10 R hand into forearm but has a tight feeling   FALLS: Has patient fallen in last 6 months? No  LIVING ENVIRONMENT: Lives with: lives with their family and lives with their spouse   PLOF: Work on [employer], gym 3 x wk , do own cooking and housework  PATIENT GOALS: Get my motion and strength back in the right hand so that I can carry and grip and hold objects  NEXT MD VISIT: 21st Of Aug  OBJECTIVE:   HAND DOMINANCE: Right   UPPER EXTREMITY ROM:     Active ROM Right eval Left eval R  04/29/23 05/03/23  Shoulder flexion      Shoulder abduction      Shoulder adduction      Shoulder extension      Shoulder internal rotation      Shoulder external rotation      Elbow flexion      Elbow extension       Wrist flexion 35 110 50 50  Wrist extension 48 85 60 55  Wrist ulnar deviation 24     Wrist radial deviation 20     Wrist pronation      Wrist supination      (Blank rows = not tested)  Active ROM Right eval Left eval R   04/29/23  Thumb MCP (0-60) 70    Thumb IP (0-80) 35    Thumb Radial abd/add (0-55) 45     Thumb Palmar abd/add (0-45) 45     Thumb Opposition to Small Finger Opposition to 3rd, 4th and 5th      Index MCP (0-90) 30   50  Index PIP (0-100) 45( ext -30   55 ( ext -20  Index DIP (0-70)       Long  MCP (0-90)  45   50  Long PIP (0-100)  90( ext -20   95 ( ext -15  Long DIP (0-70)       Ring MCP (0-90)  45   60  Ring PIP (0-100)  100 (ext -10   100( ext 0  Ring DIP (0-70)       Little MCP (0-90)  75   75  Little PIP (0-100)  90 (ext 0   100 ( ext 0  Little DIP (0-70)       (Blank rows = not tested)    COORDINATION: Unable -stiffness of all digits    EDEMA: R hand   COGNITION: Overall cognitive status: Within functional limits for tasks assessed    OBSERVATIONS:  Patient arrive with volar and dorsal car appropriate to start scar massage.  1 area at distal palmar crease still with half a centimeter scar.  Patient fitted with Isotoner glove.  To wear during the day is much as she can.  As well as a silicone sleeve for daytime use for second digit.  Provided patient with a Tubigrip D to wear if not wearing compression glove.  TODAY'S TREATMENT:                                                                                                                              DATE:  05/05/23  Fluidotherapy:  Fluido therapy to wrist and hand for 12 min total, alternating with 3 intervals of one min with cold for contrast effect.  Pt performing active ROM while in fluido this date.    Manual:   Following fluido, pt seen for manual therapy for extensive focus on scar massage with manual techniques as well as use of mini massager over C[employer] -Care scar pad on volar second  digit and second and third, web space as well as volar scar and dorsal scar on hand.   Metacarpal and carpal spreads as well as webspace massage performed by OT. Pt has C[employer] -Care scar pad for dorsal and volar scars and silicone sleeve to wear on and off during the day   Therapeutic Exercises:  Patient utilizing red foam roller to perform soft tissue massage rolling palm 20 reps prior to range of motion  Patient to continue with interlocking abduction adduction of digits 12 reps each Additional focus on opening second webspace of index and third digit Second digit DIP PIP flexion followed by passive range of motion for intrinsic digits flexion 2nd through 5th  Passive range of motion metacarpal flexion to 70 to 90 degrees prior to composite flexion. Blocked tendon glides for MC flexion, intrinsic a fist and composite fist to 4 cm foam block 12 reps active assisted range of motion if needed keeping pain under 2/10 Thumb palmar radial abduction 12 reps  AAROM Opposition to all digits 5 reps focusing on IP and MC flexion, pt reports she has been trying to focus on oval or "  O" with opposition Wrist exercise passive range of motion and active assisted range of motion for flexion and extension and radial and ulnar deviation over the edge of the table  12 reps pain-free  PATIENT EDUCATION: Education details: Findings of evaluation and home program Person educated: Patient Education method: Explanation, Demonstration, Tactile cues, Verbal cues, and Handouts Education comprehension: verbalized understanding, returned demonstration, verbal cues required, tactile cues required, and needs further education   GOALS: Goals reviewed with patient? Yes  SHORT TERM GOALS: Target date: 2 wks  Patient to be independent in home program to decrease scar tissue and edema - increased extension and flexion of digits as well as wrist Baseline: Severe stiffness in digits, increased scar tissue-scabs and dry skin  removed this date-no knowledge of home program Goal status: INITIAL   LONG TERM GOALS: Target date: 8 wks Flexion of right digits improved for patient to touch palm to initiate use in ADLs with no increase symptoms Baseline: Dry skin and scabs removed today while -MC flexion 30 to 75 degrees, PIP 45 to 100 degrees-discomfort and pain 5/10 Goal status: INITIAL   2.  Patient increase extension of right digits 2nd thru 4th to be able to don gloves, wash face and reach aroundcylinder objects without issues Baseline: Patient arrived with -10 to -30 PIP extension,  Goal status: INITIAL   3.  Scar tissue improved for patient to be able to make composite fist as well as increase extension and tolerate gripping objects Baseline: dry skin and scabs removed today; initiating scar massage but 3 scabs still in place,  unable to make composite fist as well as extension at PIPs -10 to -30 degrees Goal status: INITIAL   4.  Right grip and prehension strength increased to more than 75% compared to the left for patient to hold weight to work out and cook Baseline: scabs removed and dry skin- 3 scabs still in place; flexion extension still limited, scar massage initiated on some areas 3 weeks postop Goal status: INITIAL   5.  R wrist AROM increase to WNL to push and pull door open without increase symptoms  Baseline:  wrist flxion 35 , ext 48 and pain 5/10 Goal status: INITIAL  ASSESSMENT:  CLINICAL IMPRESSION: Patient presents at occupational therapy evaluation post multiple irrigation and debridements involving right dorsal and volar hand wounds -patient underwent a CT scan of the right hand which demonstrated a abscess formation along the volar aspect of the hand initially. She underwent 1 irrigation debridement however the patient continued to have swelling and pain prompting a repeat CT scan which demonstrated abscess formation along the dorsal aspect of the hand. The patient underwent a second  irrigation debridement and a final third procedure performed by Dr. Joice Lofts on 04/04/2023.  Patient presented for follow-up post evaluation with continued severe scar tissue and tightness in the volar and dorsal hand.  Limiting second and third digits mostly.   Patient fitted with Isotoner glove to be worn in between exercises and if needed at nighttime.  Passive range of motion to digits with a focus on keeping her wrist neutral during digit flexion. Wrist exercises active assisted range and passive range of motion of the edge of table to decrease pain on volar wrist.  Pt performing well with scar massage and home program, decreased pain overall and progressing well with ROM but remains with increased stiffness and tightness which limits function.  Pt continues to report increased stiffness, performed well with use of fluido therapy today  and working towards active ROM while in fluido prior to other therapeutic exercises.  Continued focus on scar management as well as ROM to work towards functional use of the hand.  Patient can benefit from skilled OT services to decrease scar tissue and edema and increase range of motion in flexion and extension of digits as well as wrist, increase strength to return to prior level of function. PERFORMANCE DEFICITS: in functional skills including ADLs, IADLs, coordination, dexterity, edema, ROM, strength, pain, flexibility, and UE functional use,   and psychosocial skills including habits and routines and behaviors.   IMPAIRMENTS: are limiting patient from ADLs, IADLs, rest and sleep, work, play, leisure, and social participation.   COMORBIDITIES: has no other co-morbidities that affects occupational performance. Patient will benefit from skilled OT to address above impairments and improve overall function.  MODIFICATION OR ASSISTANCE TO COMPLETE EVALUATION: No modification of tasks or assist necessary to complete an evaluation.  OT OCCUPATIONAL PROFILE AND HISTORY: Problem  focused assessment: Including review of records relating to presenting problem.  CLINICAL DECISION MAKING: LOW - limited treatment options, no task modification necessary  REHAB POTENTIAL: Good  EVALUATION COMPLEXITY: Low  PLAN:  OT FREQUENCY: 2x/week  OT DURATION: 8 weeks  PLANNED INTERVENTIONS: ADL's therapeutic exercise, therapeutic activity, manual therapy, scar mobilization, passive range of motion, splinting, paraffin, fluidotherapy, contrast bath, patient/family education, and DME and/or AE instructions  CONSULTED AND AGREED WITH PLAN OF CARE: Patient   Liev Brockbank, OTR/L,CLT 05/07/2023, 12:59 PM

## 2023-05-12 ENCOUNTER — Ambulatory Visit: Payer: No Typology Code available for payment source | Attending: Student | Admitting: Occupational Therapy

## 2023-05-12 DIAGNOSIS — M6281 Muscle weakness (generalized): Secondary | ICD-10-CM | POA: Diagnosis present

## 2023-05-12 DIAGNOSIS — M25641 Stiffness of right hand, not elsewhere classified: Secondary | ICD-10-CM | POA: Insufficient documentation

## 2023-05-12 DIAGNOSIS — M25631 Stiffness of right wrist, not elsewhere classified: Secondary | ICD-10-CM | POA: Insufficient documentation

## 2023-05-12 DIAGNOSIS — L905 Scar conditions and fibrosis of skin: Secondary | ICD-10-CM | POA: Insufficient documentation

## 2023-05-12 DIAGNOSIS — R600 Localized edema: Secondary | ICD-10-CM | POA: Insufficient documentation

## 2023-05-12 NOTE — Therapy (Signed)
OUTPATIENT OCCUPATIONAL THERAPY ORTHO TREATMENT  Patient Name: Wanda Smith MRN: [id number] DOB:[birth year], 62 y.o., female   PCP: Merlinda Frederick NP REFERRING PROVIDER: Marney Doctor PA  END OF SESSION:  OT End of Session - 05/12/23 1948     Visit Number 5    Number of Visits 16    Date for OT Re-Evaluation 06/20/23    OT Start Time 1030    OT Stop Time 1118    OT Time Calculation (min) 48 min    Activity Tolerance Patient tolerated treatment well    Behavior During Therapy WFL for tasks assessed/performed             Past Medical History:  Diagnosis Date   Anemia    Anxiety    Arthritis    knee (R)   Asthma    Chronic kidney disease    Stage 3 sees Lateef   COPD (chronic obstructive pulmonary disease) (HCC)    Depression    Diabetes mellitus without complication (HCC)    Dyspnea    GERD (gastroesophageal reflux disease)    occ   Hypertension    Hypothyroidism    Increased serum lipids    Wears contact lenses    Past Surgical History:  Procedure Laterality Date   ABDOMINAL HYSTERECTOMY     CARPAL TUNNEL RELEASE Right 07/26/2018   Procedure: CARPAL TUNNEL RELEASE ENDOSCOPIC;  Surgeon: Christena Flake, MD;  Location: [facility];  Service: Orthopedics;  Laterality: Right;  diabetic - oral meds   CHOLECYSTECTOMY     COLONOSCOPY WITH ESOPHAGOGASTRODUODENOSCOPY (EGD)     COLONOSCOPY WITH PROPOFOL N/A 01/13/2018   Procedure: COLONOSCOPY WITH PROPOFOL;  Surgeon: Scot Jun, MD;  Location: [facility] END[facility];  Service: Endoscopy;  Laterality: N/A;   HERNIA REPAIR     I & D EXTREMITY Right 04/01/2023   Procedure: IRRIGATION AND DEBRIDEMENT EXTREMITY PREVENA PLACEMENT;  Surgeon: Christena Flake, MD;  Location: [facility];  Service: Orthopedics;  Laterality: Right;   INCISION AND DRAINAGE ABSCESS Right 03/30/2023   Procedure: INCISION AND DRAINAGE ABSCESS RIGHT HAND;  Surgeon: Christena Flake, MD;  Location: [facility];  Service: Orthopedics;  Laterality: Right;    INCISION AND DRAINAGE OF WOUND Right 04/04/2023   Procedure: IRRIGATION AND DEBRIDEMENT RIGHT HAND DORSAL AND PALMAR INCISIONS WITH DELAYED PRIMARY CLOSURE;  Surgeon: Christena Flake, MD;  Location: [facility];  Service: Orthopedics;  Laterality: Right;   left rotator cuff repair Left    SHOULDER ARTHROSCOPY WITH SUBACROMIAL DECOMPRESSION, ROTATOR CUFF REPAIR AND BICEP TENDON REPAIR Right 10/21/2021   Procedure: SHOULDER ARTHROSCOPY WITH DEBRIDEMENT, DECOMPRESSION, BICEPS TENODESIS.;  Surgeon: Christena Flake, MD;  Location: [facility];  Service: Orthopedics;  Laterality: Right;   TUBAL LIGATION     Patient Active Problem List   Diagnosis Date Noted   MRSA infection 04/04/2023   Abscess of right hand 03/31/2023   Cellulitis of right hand 03/30/2023   Depression with anxiety 03/30/2023   HLD (hyperlipidemia) 03/30/2023   Type II diabetes mellitus with renal manifestations (HCC) 03/30/2023   Chronic kidney disease, stage 3a (HCC) 03/30/2023   Abnormal LFTs 03/30/2023   Severe sepsis (HCC) 03/30/2023   Hypothyroidism    Hypertension    COPD (chronic obstructive pulmonary disease) (HCC)     ONSET DATE: 03/30/23  REFERRING DIAG:Abscess of R hand -3 irrigation surgeries  THERAPY DIAG:  Scar condition and fibrosis of skin  Localized edema  Stiffness of right hand, not elsewhere classified  Stiffness of right  wrist, not elsewhere classified  Muscle weakness (generalized)  Rationale for Evaluation and Treatment: Rehabilitation  SUBJECTIVE:   SUBJECTIVE STATEMENT: I feel like I am stuck with my index finger.  It is so tight that is starting to hurt.  Scars still thick.  pt accompanied by: self  PERTINENT HISTORY:  Ortho note 04/20/23 Wanda Smith is a 62 y.o. female who presents today for a skin check status post multiple irrigation and debridements involving right dorsal and volar hand wounds and possible suture removal. The patient was admitted to the hospital after suffering a dog  scratch which eventually became more swollen and painful. The patient underwent a CT scan of the right hand which demonstrated a abscess formation along the volar aspect of the hand initially. She underwent 1 irrigation debridement however the patient continued to have swelling and pain prompting a repeat CT scan which demonstrated abscess formation along the dorsal aspect of the hand. The patient underwent a second irrigation debridement and a final third procedure performed by Dr. Joice Lofts on 04/04/2023. The patient was evaluated last week where skin inspection demonstrated excellent granulation without any significant erythema or purulent drainage. The patient then had a follow-up with infectious disease where repeat CRP came back at a normal level. The patient does still have a oral prescription for linezolid which she is scheduled to take until end this week. She denies any trauma or injury affecting the right hand since her last evaluation. She does report an occasional burning discomfort in the index finger. She is still taking occasional oxycodone. She reports a 0 out of 10 pain score at today's visit. Stitches remove- and to finish antibiotics and refer to OT   PRECAUTIONS: None      WEIGHT BEARING RESTRICTIONS: No  PAIN:  Are you having pain? 0/10 R hand into forearm but has a tight feeling   FALLS: Has patient fallen in last 6 months? No  LIVING ENVIRONMENT: Lives with: lives with their family and lives with their spouse   PLOF: Work on [employer], gym 3 x wk , do own cooking and housework  PATIENT GOALS: Get my motion and strength back in the right hand so that I can carry and grip and hold objects  NEXT MD VISIT: Middle September OBJECTIVE:   HAND DOMINANCE: Right   UPPER EXTREMITY ROM:     Active ROM Right eval Left eval R  04/29/23 05/03/23  Shoulder flexion      Shoulder abduction      Shoulder adduction      Shoulder extension      Shoulder internal rotation       Shoulder external rotation      Elbow flexion      Elbow extension      Wrist flexion 35 110 50 50  Wrist extension 48 85 60 55  Wrist ulnar deviation 24     Wrist radial deviation 20     Wrist pronation      Wrist supination      (Blank rows = not tested)  Active ROM Right eval Left eval R   04/29/23 R  05/12/23  Thumb MCP (0-60) 70     Thumb IP (0-80) 35     Thumb Radial abd/add (0-55) 45    50  Thumb Palmar abd/add (0-45) 45    50  Thumb Opposition to Small Finger Opposition to 3rd, 4th and 5th     Opposition to all digits  Index MCP (0-90) 30  50 60  Index PIP (0-100) 45( ext -30   55 ( ext -20 60 ( ext -10)  Index DIP (0-70)        Long MCP (0-90)  45   50 60  Long PIP (0-100)  90( ext -20   95 ( ext -15 95 ( ext -5)  Long DIP (0-70)        Ring MCP (0-90)  45   60 70  Ring PIP (0-100)  100 (ext -10   100( ext 0  100 - full ext  Ring DIP (0-70)        Little MCP (0-90)  75   75 80  Little PIP (0-100)  90 (ext 0   100 ( ext 0 100- full ext   Little DIP (0-70)        (Blank rows = not tested)    COORDINATION: Decreasing significantly because of stiffness and scar tissue in the second digit  EDEMA: R hand   COGNITION: Overall cognitive status: Within functional limits for tasks assessed    OBSERVATIONS:  Patient arrived with scar adhesion over the volar and dorsal hand limiting mostly second digit severely at [facility] and  PIP flexion as well as composite flexion TODAY'S TREATMENT:                                                                                                                              DATE:  05/05/23  Paraffin : Paraffin to wrist and hand for 8 min total, to increase range of motion decrease scar tissue prior to range of motion  Manual:   manual therapy for extensive focus on scar massage with manual techniques as well as use of mini massager over [employer] -[employer] scar pad on volar second digit and second and third, web space as well as volar scar and  dorsal scar on hand.   Metacarpal and carpal spreads as well as webspace massage performed by OT. Pt has [employer] -Care scar pad for dorsal and volar scars and silicone sleeve to wear on and off during the day  Done this date Kinesiotape to the dorsal hand scar with 2 parallel to 20% and 2 across at 100% to facilitate scar mobilization during range of motion daytime  Therapeutic Exercises:  Patient utilizing red foam roller to perform soft tissue massage rolling palm 20 reps prior to range of motion  Patient to continue with interlocking abduction adduction of digits 12 reps each Additional focus on opening second webspace of index and third digit  This date fabricated for patient to flexion glove with rubber bands for second and third digit to volar wrist for 3 x 3 minutes prior to blocked active range of motion. Fabricated DIP/PIP flexion strap to use for intrinsic of flexion at second digit to wear 2 x 2 minutes Assess progress with both Change home program for patient to use DIP/PIP strap first 2 x 2 minutes prior to flexion  glove 2-3 times for 3 minutes Followed by Barbaraann Boys for MC flexion Followed by active assisted range of motion intrinsic digits flexion 2nd through 5th  Followed by composite flexion in place and hold in composite flexion   Continue with thumb palmar radial abduction 12 reps  AAROM Opposition to all digits 5 reps focusing on IP and MC flexion, pt reports she has been trying to focus on oval or "O" with opposition Wrist exercise passive range of motion and active assisted range of motion for flexion and extension and radial and ulnar deviation over the edge of the table  12 reps pain-free  PATIENT EDUCATION: Education details: Findings of evaluation and home program Person educated: Patient Education method: Explanation, Demonstration, Tactile cues, Verbal cues, and Handouts Education comprehension: verbalized understanding, returned demonstration, verbal cues required,  tactile cues required, and needs further education   GOALS: Goals reviewed with patient? Yes  SHORT TERM GOALS: Target date: 2 wks  Patient to be independent in home program to decrease scar tissue and edema - increased extension and flexion of digits as well as wrist Baseline: Severe stiffness in digits, increased scar tissue-scabs and dry skin removed this date-no knowledge of home program Goal status: INITIAL   LONG TERM GOALS: Target date: 8 wks Flexion of right digits improved for patient to touch palm to initiate use in ADLs with no increase symptoms Baseline: Dry skin and scabs removed today while -MC flexion 30 to 75 degrees, PIP 45 to 100 degrees-discomfort and pain 5/10 Goal status: INITIAL   2.  Patient increase extension of right digits 2nd thru 4th to be able to don gloves, wash face and reach aroundcylinder objects without issues Baseline: Patient arrived with -10 to -30 PIP extension,  Goal status: INITIAL   3.  Scar tissue improved for patient to be able to make composite fist as well as increase extension and tolerate gripping objects Baseline: dry skin and scabs removed today; initiating scar massage but 3 scabs still in place,  unable to make composite fist as well as extension at PIPs -10 to -30 degrees Goal status: INITIAL   4.  Right grip and prehension strength increased to more than 75% compared to the left for patient to hold weight to work out and cook Baseline: scabs removed and dry skin- 3 scabs still in place; flexion extension still limited, scar massage initiated on some areas 3 weeks postop Goal status: INITIAL   5.  R wrist AROM increase to WNL to push and pull door open without increase symptoms  Baseline:  wrist flxion 35 , ext 48 and pain 5/10 Goal status: INITIAL  ASSESSMENT:  CLINICAL IMPRESSION: Patient presents at occupational therapy evaluation post multiple irrigation and debridements involving right dorsal and volar hand wounds -patient  underwent a CT scan of the right hand which demonstrated a abscess formation along the volar aspect of the hand initially. She underwent 1 irrigation debridement however the patient continued to have swelling and pain prompting a repeat CT scan which demonstrated abscess formation along the dorsal aspect of the hand. The patient underwent a second irrigation debridement and a final third procedure performed by Dr. Joice Lofts on 04/04/2023.  Patient presented for follow-up post evaluation with continued severe scar tissue and tightness in the volar and dorsal hand.  Limiting second and third digits mostly.   This date changed home program for patient to use DIP/PIP strap for second digit prior to flexion glove 2 to 3 minutes on both to 3  times after moist heat and scar mobilization and prior to active assisted and active range of motion of digits.  Focus on scar adding Kinesiotape to dorsal scar for daytime use.   Continued focus on scar management as well as ROM to work towards functional use of the hand.  Patient can benefit from skilled OT services to decrease scar tissue and edema and increase range of motion in flexion and extension of digits as well as wrist, increase strength to return to prior level of function. PERFORMANCE DEFICITS: in functional skills including ADLs, IADLs, coordination, dexterity, edema, ROM, strength, pain, flexibility, and UE functional use,   and psychosocial skills including habits and routines and behaviors.   IMPAIRMENTS: are limiting patient from ADLs, IADLs, rest and sleep, work, play, leisure, and social participation.   COMORBIDITIES: has no other co-morbidities that affects occupational performance. Patient will benefit from skilled OT to address above impairments and improve overall function.  MODIFICATION OR ASSISTANCE TO COMPLETE EVALUATION: No modification of tasks or assist necessary to complete an evaluation.  OT OCCUPATIONAL PROFILE AND HISTORY: Problem focused  assessment: Including review of records relating to presenting problem.  CLINICAL DECISION MAKING: LOW - limited treatment options, no task modification necessary  REHAB POTENTIAL: Good  EVALUATION COMPLEXITY: Low  PLAN:  OT FREQUENCY: 2x/week  OT DURATION: 8 weeks  PLANNED INTERVENTIONS: ADL's therapeutic exercise, therapeutic activity, manual therapy, scar mobilization, passive range of motion, splinting, paraffin, fluidotherapy, contrast bath, patient/family education, and DME and/or AE instructions  CONSULTED AND AGREED WITH PLAN OF CARE: Patient   Oletta Cohn, OTR/L,CLT 05/12/2023, 7:51 PM

## 2023-05-13 ENCOUNTER — Ambulatory Visit: Payer: No Typology Code available for payment source | Admitting: Occupational Therapy

## 2023-05-13 DIAGNOSIS — M25631 Stiffness of right wrist, not elsewhere classified: Secondary | ICD-10-CM

## 2023-05-13 DIAGNOSIS — M6281 Muscle weakness (generalized): Secondary | ICD-10-CM

## 2023-05-13 DIAGNOSIS — R600 Localized edema: Secondary | ICD-10-CM

## 2023-05-13 DIAGNOSIS — L905 Scar conditions and fibrosis of skin: Secondary | ICD-10-CM | POA: Diagnosis not present

## 2023-05-13 DIAGNOSIS — M25641 Stiffness of right hand, not elsewhere classified: Secondary | ICD-10-CM

## 2023-05-13 NOTE — Therapy (Signed)
OUTPATIENT OCCUPATIONAL THERAPY ORTHO TREATMENT  Patient Name: Wanda Smith MRN: [id number] DOB:1961/02/23, 62 y.o., female   PCP: Merlinda Frederick NP REFERRING PROVIDER: Marney Doctor PA  END OF SESSION:  OT End of Session - 05/13/23 0912     Visit Number 6    Number of Visits 16    Date for OT Re-Evaluation 06/20/23    OT Start Time 0900    OT Stop Time 0944    OT Time Calculation (min) 44 min    Activity Tolerance Patient tolerated treatment well    Behavior During Therapy WFL for tasks assessed/performed             Past Medical History:  Diagnosis Date   Anemia    Anxiety    Arthritis    knee (R)   Asthma    Chronic kidney disease    Stage 3 sees [place]   COPD (chronic obstructive pulmonary disease) (HCC)    Depression    Diabetes mellitus without complication (HCC)    Dyspnea    GERD (gastroesophageal reflux disease)    occ   Hypertension    Hypothyroidism    Increased serum lipids    Wears contact lenses    Past Surgical History:  Procedure Laterality Date   ABDOMINAL HYSTERECTOMY     CARPAL TUNNEL RELEASE Right 07/26/2018   Procedure: CARPAL TUNNEL RELEASE ENDOSCOPIC;  Surgeon: Christena Flake, MD;  Location: [facility];  Service: Orthopedics;  Laterality: Right;  diabetic - oral meds   CHOLECYSTECTOMY     COLONOSCOPY WITH ESOPHAGOGASTRODUODENOSCOPY (EGD)     COLONOSCOPY WITH PROPOFOL N/A 01/13/2018   Procedure: COLONOSCOPY WITH PROPOFOL;  Surgeon: Scot Jun, MD;  Location: [facility];  Service: Endoscopy;  Laterality: N/A;   HERNIA REPAIR     I & D EXTREMITY Right 04/01/2023   Procedure: IRRIGATION AND DEBRIDEMENT EXTREMITY PREVENA PLACEMENT;  Surgeon: Christena Flake, MD;  Location: [facility];  Service: Orthopedics;  Laterality: Right;   INCISION AND DRAINAGE ABSCESS Right 03/30/2023   Procedure: INCISION AND DRAINAGE ABSCESS RIGHT HAND;  Surgeon: Christena Flake, MD;  Location: [facility];  Service: Orthopedics;  Laterality: Right;    INCISION AND DRAINAGE OF WOUND Right 04/04/2023   Procedure: IRRIGATION AND DEBRIDEMENT RIGHT HAND DORSAL AND PALMAR INCISIONS WITH DELAYED PRIMARY CLOSURE;  Surgeon: Christena Flake, MD;  Location: [facility];  Service: Orthopedics;  Laterality: Right;   left rotator cuff repair Left    SHOULDER ARTHROSCOPY WITH SUBACROMIAL DECOMPRESSION, ROTATOR CUFF REPAIR AND BICEP TENDON REPAIR Right 10/21/2021   Procedure: SHOULDER ARTHROSCOPY WITH DEBRIDEMENT, DECOMPRESSION, BICEPS TENODESIS.;  Surgeon: Christena Flake, MD;  Location: [facility];  Service: Orthopedics;  Laterality: Right;   TUBAL LIGATION     Patient Active Problem List   Diagnosis Date Noted   MRSA infection 04/04/2023   Abscess of right hand 03/31/2023   Cellulitis of right hand 03/30/2023   Depression with anxiety 03/30/2023   HLD (hyperlipidemia) 03/30/2023   Type II diabetes mellitus with renal manifestations (HCC) 03/30/2023   Chronic kidney disease, stage 3a (HCC) 03/30/2023   Abnormal LFTs 03/30/2023   Severe sepsis (HCC) 03/30/2023   Hypothyroidism    Hypertension    COPD (chronic obstructive pulmonary disease) (HCC)     ONSET DATE: 03/30/23  REFERRING DIAG:Abscess of R hand -3 irrigation surgeries  THERAPY DIAG:  Scar condition and fibrosis of skin  Localized edema  Stiffness of right hand, not elsewhere classified  Stiffness of right  wrist, not elsewhere classified  Muscle weakness (generalized)  Rationale for Evaluation and Treatment: Rehabilitation  SUBJECTIVE:   SUBJECTIVE STATEMENT: I done the flexion glove 1 times since yesterday.  Still very tight  pt accompanied by: self  PERTINENT HISTORY:  Ortho note 04/20/23 Wanda Smith is a 62 y.o. female who presents today for a skin check status post multiple irrigation and debridements involving right dorsal and volar hand wounds and possible suture removal. The patient was admitted to the hospital after suffering a dog scratch which eventually became more  swollen and painful. The patient underwent a CT scan of the right hand which demonstrated a abscess formation along the volar aspect of the hand initially. She underwent 1 irrigation debridement however the patient continued to have swelling and pain prompting a repeat CT scan which demonstrated abscess formation along the dorsal aspect of the hand. The patient underwent a second irrigation debridement and a final third procedure performed by Dr. Joice Lofts on 04/04/2023. The patient was evaluated last week where skin inspection demonstrated excellent granulation without any significant erythema or purulent drainage. The patient then had a follow-up with infectious disease where repeat CRP came back at a normal level. The patient does still have a oral prescription for linezolid which she is scheduled to take until end this week. She denies any trauma or injury affecting the right hand since her last evaluation. She does report an occasional burning discomfort in the index finger. She is still taking occasional oxycodone. She reports a 0 out of 10 pain score at today's visit. Stitches remove- and to finish antibiotics and refer to OT   PRECAUTIONS: None      WEIGHT BEARING RESTRICTIONS: No  PAIN:  Are you having pain? 0/10 R hand into forearm but has a tight feeling   FALLS: Has patient fallen in last 6 months? No  LIVING ENVIRONMENT: Lives with: lives with their family and lives with their spouse   PLOF: Work on [employer], gym 3 x wk , do own cooking and housework  PATIENT GOALS: Get my motion and strength back in the right hand so that I can carry and grip and hold objects  NEXT MD VISIT: Middle September OBJECTIVE:   HAND DOMINANCE: Right   UPPER EXTREMITY ROM:     Active ROM Right eval Left eval R  04/29/23 05/03/23  Shoulder flexion      Shoulder abduction      Shoulder adduction      Shoulder extension      Shoulder internal rotation      Shoulder external rotation      Elbow  flexion      Elbow extension      Wrist flexion 35 110 50 50  Wrist extension 48 85 60 55  Wrist ulnar deviation 24     Wrist radial deviation 20     Wrist pronation      Wrist supination      (Blank rows = not tested)  Active ROM Right eval Left eval R   04/29/23 R  05/12/23  Thumb MCP (0-60) 70     Thumb IP (0-80) 35     Thumb Radial abd/add (0-55) 45    50  Thumb Palmar abd/add (0-45) 45    50  Thumb Opposition to Small Finger Opposition to 3rd, 4th and 5th     Opposition to all digits  Index MCP (0-90) 30   50 60  Index PIP (0-100) 45( ext -30  55 ( ext -20 60 ( ext -10)  Index DIP (0-70)        Long MCP (0-90)  45   50 60  Long PIP (0-100)  90( ext -20   95 ( ext -15 95 ( ext -5)  Long DIP (0-70)        Ring MCP (0-90)  45   60 70  Ring PIP (0-100)  100 (ext -10   100( ext 0  100 - full ext  Ring DIP (0-70)        Little MCP (0-90)  75   75 80  Little PIP (0-100)  90 (ext 0   100 ( ext 0 100- full ext   Little DIP (0-70)        (Blank rows = not tested)    COORDINATION: Decreasing significantly because of stiffness and scar tissue in the second digit  EDEMA: R hand   COGNITION: Overall cognitive status: Within functional limits for tasks assessed    OBSERVATIONS:  Patient arrived with scar adhesion over the volar and dorsal hand limiting mostly second digit severely at [facility] and  PIP flexion as well as composite flexion TODAY'S TREATMENT:                                                                                                                              DATE:  05/05/23  Paraffin : Paraffin to wrist and hand for 8 min total, Coban flexion glove done to second and third digits during paraffin -to increase range of motion decrease scar tissue prior to range of motion  Manual:   manual therapy extensive focus on scar massage with manual techniques using Coban on dorsal hand and provided patient and educated on use.  Used mini massager  on volar second digit ,  web space as well as volar scar and  on hand.  Done extractor on the dorsal scar with active and passive range of motion of second digit.  Metacarpal and carpal spreads as well as webspace massage performed by OT. Pt has [employer] -Care scar pad for dorsal and volar scars and silicone sleeve to wear on and off during the day  Patient has Kinesiotape to use on dorsal hand scar with 2 parallel to 20% and 2 across at 100% to facilitate scar mobilization during range of motion daytime  Therapeutic Exercises:  Patient utilizing red foam roller to perform soft tissue massage rolling palm 20 reps prior to range of motion  Patient to continue with interlocking abduction adduction of digits 12 reps each Additional focus on opening second webspace of index and third digit  Fabricated last time flexion glove with rubber bands for second and third digit to volar wrist for 3 x 3 minutes prior to blocked active range of motion. Also fabricated last time DIP/PIP flexion strap to use for intrinsic of flexion at second digit to wear 2 x 2 minutes Assess progress and use with  both Patient to use DIP/PIP strap first 2 x 2 minutes prior to flexion glove 2-3 times for 3 minutes Followed by Barbaraann Boys for MC flexion Followed by active assisted range of motion intrinsic digits flexion 2nd through 5th  Followed by composite flexion in place and hold in composite flexion  Increase of passive range of motion 20 degrees at MCP and PIP total.  Blocked increase of 10 to 15 degrees of PIP 5 to 10 degrees blocked active range of motion increased at [facility].  Continue with thumb palmar radial abduction 12 reps  AAROM Opposition to all digits 5 reps focusing on IP and MC flexion, pt reports she has been trying to focus on oval or "O" with opposition Wrist exercise passive range of motion and active assisted range of motion for flexion and extension and radial and ulnar deviation over the edge of the table  12 reps pain-free  PATIENT  EDUCATION: Education details: Findings of evaluation and home program Person educated: Patient Education method: Explanation, Demonstration, Tactile cues, Verbal cues, and Handouts Education comprehension: verbalized understanding, returned demonstration, verbal cues required, tactile cues required, and needs further education   GOALS: Goals reviewed with patient? Yes  SHORT TERM GOALS: Target date: 2 wks  Patient to be independent in home program to decrease scar tissue and edema - increased extension and flexion of digits as well as wrist Baseline: Severe stiffness in digits, increased scar tissue-scabs and dry skin removed this date-no knowledge of home program Goal status: INITIAL   LONG TERM GOALS: Target date: 8 wks Flexion of right digits improved for patient to touch palm to initiate use in ADLs with no increase symptoms Baseline: Dry skin and scabs removed today while -MC flexion 30 to 75 degrees, PIP 45 to 100 degrees-discomfort and pain 5/10 Goal status: INITIAL   2.  Patient increase extension of right digits 2nd thru 4th to be able to don gloves, wash face and reach aroundcylinder objects without issues Baseline: Patient arrived with -10 to -30 PIP extension,  Goal status: INITIAL   3.  Scar tissue improved for patient to be able to make composite fist as well as increase extension and tolerate gripping objects Baseline: dry skin and scabs removed today; initiating scar massage but 3 scabs still in place,  unable to make composite fist as well as extension at PIPs -10 to -30 degrees Goal status: INITIAL   4.  Right grip and prehension strength increased to more than 75% compared to the left for patient to hold weight to work out and cook Baseline: scabs removed and dry skin- 3 scabs still in place; flexion extension still limited, scar massage initiated on some areas 3 weeks postop Goal status: INITIAL   5.  R wrist AROM increase to WNL to push and pull door open  without increase symptoms  Baseline:  wrist flxion 35 , ext 48 and pain 5/10 Goal status: INITIAL  ASSESSMENT:  CLINICAL IMPRESSION: Patient presents at occupational therapy evaluation post multiple irrigation and debridements involving right dorsal and volar hand wounds -patient underwent a CT scan of the right hand which demonstrated a abscess formation along the volar aspect of the hand initially. She underwent 1 irrigation debridement however the patient continued to have swelling and pain prompting a repeat CT scan which demonstrated abscess formation along the dorsal aspect of the hand. The patient underwent a second irrigation debridement and a final third procedure performed by Dr. Joice Lofts on 04/04/2023.  Patient presented for follow-up post  evaluation with continued severe scar tissue and tightness in the volar and dorsal hand.  Limiting second and third digits mostly.   Initiated Coban flexion wrap in paraffin to increase flexion at second and third digits with focus on decreasing scar tissue.  Home program for patient to use DIP/PIP strap for second digit prior to flexion glove 2 to 3 minutes on both to 3 times after moist heat and scar mobilization and prior to active assisted and active range of motion of digits.  Focus on scar continue with Kinesiotape to dorsal scar for daytime use.   Continued focus on scar management as well as ROM to work towards functional use of the hand.  Patient can benefit from skilled OT services to decrease scar tissue and edema and increase range of motion in flexion and extension of digits as well as wrist, increase strength to return to prior level of function. PERFORMANCE DEFICITS: in functional skills including ADLs, IADLs, coordination, dexterity, edema, ROM, strength, pain, flexibility, and UE functional use,   and psychosocial skills including habits and routines and behaviors.   IMPAIRMENTS: are limiting patient from ADLs, IADLs, rest and sleep, work, play,  leisure, and social participation.   COMORBIDITIES: has no other co-morbidities that affects occupational performance. Patient will benefit from skilled OT to address above impairments and improve overall function.  MODIFICATION OR ASSISTANCE TO COMPLETE EVALUATION: No modification of tasks or assist necessary to complete an evaluation.  OT OCCUPATIONAL PROFILE AND HISTORY: Problem focused assessment: Including review of records relating to presenting problem.  CLINICAL DECISION MAKING: LOW - limited treatment options, no task modification necessary  REHAB POTENTIAL: Good  EVALUATION COMPLEXITY: Low  PLAN:  OT FREQUENCY: 2x/week  OT DURATION: 8 weeks  PLANNED INTERVENTIONS: ADL's therapeutic exercise, therapeutic activity, manual therapy, scar mobilization, passive range of motion, splinting, paraffin, fluidotherapy, contrast bath, patient/family education, and DME and/or AE instructions  CONSULTED AND AGREED WITH PLAN OF CARE: Patient   Oletta Cohn, OTR/L,CLT 05/13/2023, 11:13 AM

## 2023-05-16 ENCOUNTER — Ambulatory Visit: Payer: No Typology Code available for payment source | Admitting: Occupational Therapy

## 2023-05-16 DIAGNOSIS — L905 Scar conditions and fibrosis of skin: Secondary | ICD-10-CM | POA: Diagnosis not present

## 2023-05-16 DIAGNOSIS — M25641 Stiffness of right hand, not elsewhere classified: Secondary | ICD-10-CM

## 2023-05-16 DIAGNOSIS — M25631 Stiffness of right wrist, not elsewhere classified: Secondary | ICD-10-CM

## 2023-05-16 DIAGNOSIS — M6281 Muscle weakness (generalized): Secondary | ICD-10-CM

## 2023-05-16 DIAGNOSIS — R600 Localized edema: Secondary | ICD-10-CM

## 2023-05-16 NOTE — Therapy (Signed)
OUTPATIENT OCCUPATIONAL THERAPY ORTHO TREATMENT  Patient Name: Wanda Smith MRN: [id number] DOB:1960/11/19, 62 y.o., female   PCP: Merlinda Frederick NP REFERRING PROVIDER: Marney Doctor PA  END OF SESSION:  OT End of Session - 05/16/23 0908     Visit Number 7    Number of Visits 16    Date for OT Re-Evaluation 06/20/23    OT Start Time 0908    OT Stop Time 0950    OT Time Calculation (min) 42 min    Activity Tolerance Patient tolerated treatment well    Behavior During Therapy WFL for tasks assessed/performed             Past Medical History:  Diagnosis Date   Anemia    Anxiety    Arthritis    knee (R)   Asthma    Chronic kidney disease    Stage 3 sees [place]   COPD (chronic obstructive pulmonary disease) (HCC)    Depression    Diabetes mellitus without complication (HCC)    Dyspnea    GERD (gastroesophageal reflux disease)    occ   Hypertension    Hypothyroidism    Increased serum lipids    Wears contact lenses    Past Surgical History:  Procedure Laterality Date   ABDOMINAL HYSTERECTOMY     CARPAL TUNNEL RELEASE Right 07/26/2018   Procedure: CARPAL TUNNEL RELEASE ENDOSCOPIC;  Surgeon: Christena Flake, MD;  Location: [facility];  Service: Orthopedics;  Laterality: Right;  diabetic - oral meds   CHOLECYSTECTOMY     COLONOSCOPY WITH ESOPHAGOGASTRODUODENOSCOPY (EGD)     COLONOSCOPY WITH PROPOFOL N/A 01/13/2018   Procedure: COLONOSCOPY WITH PROPOFOL;  Surgeon: Scot Jun, MD;  Location: [facility];  Service: Endoscopy;  Laterality: N/A;   HERNIA REPAIR     I & D EXTREMITY Right 04/01/2023   Procedure: IRRIGATION AND DEBRIDEMENT EXTREMITY PREVENA PLACEMENT;  Surgeon: Christena Flake, MD;  Location: [facility];  Service: Orthopedics;  Laterality: Right;   INCISION AND DRAINAGE ABSCESS Right 03/30/2023   Procedure: INCISION AND DRAINAGE ABSCESS RIGHT HAND;  Surgeon: Christena Flake, MD;  Location: [facility];  Service: Orthopedics;  Laterality: Right;    INCISION AND DRAINAGE OF WOUND Right 04/04/2023   Procedure: IRRIGATION AND DEBRIDEMENT RIGHT HAND DORSAL AND PALMAR INCISIONS WITH DELAYED PRIMARY CLOSURE;  Surgeon: Christena Flake, MD;  Location: [facility];  Service: Orthopedics;  Laterality: Right;   left rotator cuff repair Left    SHOULDER ARTHROSCOPY WITH SUBACROMIAL DECOMPRESSION, ROTATOR CUFF REPAIR AND BICEP TENDON REPAIR Right 10/21/2021   Procedure: SHOULDER ARTHROSCOPY WITH DEBRIDEMENT, DECOMPRESSION, BICEPS TENODESIS.;  Surgeon: Christena Flake, MD;  Location: [facility];  Service: Orthopedics;  Laterality: Right;   TUBAL LIGATION     Patient Active Problem List   Diagnosis Date Noted   MRSA infection 04/04/2023   Abscess of right hand 03/31/2023   Cellulitis of right hand 03/30/2023   Depression with anxiety 03/30/2023   HLD (hyperlipidemia) 03/30/2023   Type II diabetes mellitus with renal manifestations (HCC) 03/30/2023   Chronic kidney disease, stage 3a (HCC) 03/30/2023   Abnormal LFTs 03/30/2023   Severe sepsis (HCC) 03/30/2023   Hypothyroidism    Hypertension    COPD (chronic obstructive pulmonary disease) (HCC)     ONSET DATE: 03/30/23  REFERRING DIAG:Abscess of R hand -3 irrigation surgeries  THERAPY DIAG:  Scar condition and fibrosis of skin  Localized edema  Stiffness of right hand, not elsewhere classified  Stiffness of right  wrist, not elsewhere classified  Muscle weakness (generalized)  Rationale for Evaluation and Treatment: Rehabilitation  SUBJECTIVE:   SUBJECTIVE STATEMENT: No more increased swelling probably.  Pain is about a 5/10 over the index finger.  Did try some cooking over the weekend.  My hand got tired.  pt accompanied by: self  PERTINENT HISTORY:  Ortho note 04/20/23 Wanda Smith is a 62 y.o. female who presents today for a skin check status post multiple irrigation and debridements involving right dorsal and volar hand wounds and possible suture removal. The patient was admitted to  the hospital after suffering a dog scratch which eventually became more swollen and painful. The patient underwent a CT scan of the right hand which demonstrated a abscess formation along the volar aspect of the hand initially. She underwent 1 irrigation debridement however the patient continued to have swelling and pain prompting a repeat CT scan which demonstrated abscess formation along the dorsal aspect of the hand. The patient underwent a second irrigation debridement and a final third procedure performed by Dr. Joice Lofts on 04/04/2023. The patient was evaluated last week where skin inspection demonstrated excellent granulation without any significant erythema or purulent drainage. The patient then had a follow-up with infectious disease where repeat CRP came back at a normal level. The patient does still have a oral prescription for linezolid which she is scheduled to take until end this week. She denies any trauma or injury affecting the right hand since her last evaluation. She does report an occasional burning discomfort in the index finger. She is still taking occasional oxycodone. She reports a 0 out of 10 pain score at today's visit. Stitches remove- and to finish antibiotics and refer to OT   PRECAUTIONS: None      WEIGHT BEARING RESTRICTIONS: No  PAIN:  Are you having pain? 5/10 dorsal and second digit  FALLS: Has patient fallen in last 6 months? No  LIVING ENVIRONMENT: Lives with: lives with their family and lives with their spouse   PLOF: Work on [employer], gym 3 x wk , do own cooking and housework  PATIENT GOALS: Get my motion and strength back in the right hand so that I can carry and grip and hold objects  NEXT MD VISIT: Middle September OBJECTIVE:   HAND DOMINANCE: Right   UPPER EXTREMITY ROM:     Active ROM Right eval Left eval R  04/29/23 05/03/23 05/16/23 R  Shoulder flexion       Shoulder abduction       Shoulder adduction       Shoulder extension       Shoulder  internal rotation       Shoulder external rotation       Elbow flexion       Elbow extension       Wrist flexion 35 110 50 50 63  Wrist extension 48 85 60 55 65  Wrist ulnar deviation 24      Wrist radial deviation 20      Wrist pronation       Wrist supination       (Blank rows = not tested)  Active ROM Right eval Left eval R   04/29/23 R  05/12/23 R  05/16/23  Thumb MCP (0-60) 70      Thumb IP (0-80) 35      Thumb Radial abd/add (0-55) 45    50   Thumb Palmar abd/add (0-45) 45    50   Thumb Opposition to Small  Finger Opposition to 3rd, 4th and 5th     Opposition to all digits   Index MCP (0-90) 30   50 60 60(  Index PIP (0-100) 45( ext -30   55 ( ext -20 60 ( ext -10) 60 ( blocked 70)  Index DIP (0-70)       50  Long MCP (0-90)  45   50 60 65  Long PIP (0-100)  90( ext -20   95 ( ext -15 95 ( ext -5)   Long DIP (0-70)         Ring MCP (0-90)  45   60 70 75  Ring PIP (0-100)  100 (ext -10   100( ext 0  100 - full ext   Ring DIP (0-70)         Little MCP (0-90)  75   75 80   Little PIP (0-100)  90 (ext 0   100 ( ext 0 100- full ext    Little DIP (0-70)         (Blank rows = not tested)    COORDINATION: Decreasing significantly because of stiffness and scar tissue in the second digit  EDEMA: R hand   COGNITION: Overall cognitive status: Within functional limits for tasks assessed    OBSERVATIONS:  Patient arrived with scar adhesion over the volar and dorsal hand limiting mostly second digit severely at [facility] and  PIP flexion as well as composite flexion TODAY'S TREATMENT:                                                                                                                              DATE:  05/16/23  Paraffin : Paraffin to wrist and hand for 8 min total, Coban flexion glove done to second and third digits during paraffin -to increase range of motion decrease scar tissue prior to range of motion  Manual:   manual therapy extensive focus on scar massage with  manual techniques using Coban on dorsal hand and provided patient and educated on use.  Used mini massager  on volar second digit , web space as well as volar scar and  on hand.  Done extractor on the dorsal scar with active and passive range of motion of second digit.  Metacarpal and carpal spreads as well as webspace massage performed by OT. Pt has [employer] -Care scar pad for dorsal and volar scars and silicone sleeve to wear on and off during the day  Patient has Kinesiotape to use on dorsal hand scar with 2 parallel to 20% and 2 across at 100% to facilitate scar mobilization during range of motion daytime  Therapeutic Exercises:  Patient to cont  red foam roller to perform soft tissue massage rolling palm 20 reps prior to range of motion  Patient to continue with interlocking abduction adduction of digits 12 reps each Additional focus on opening second webspace of index and third digit  Composite passive range of  motion for 3rd through 5th digits 8 reps. On the third on DIP and PIP 8 reps of passive range of motion prior to composite. Asked patient to do after use of flexion glove prior to composite fist.  Fabricated last week flexion glove with rubber bands for second and third digit to volar wrist for 3 x 3 minutes prior to blocked active range of motion. Also fabricated last week DIP/PIP flexion strap to use for intrinsic of flexion at second digit to wear 2 x 2 minutes Assess progress and use with both Patient to use DIP/PIP strap first 2 x 2 minutes prior to flexion glove 2-3 times for 3 minutes Followed by Barbaraann Boys for MC flexion Followed by active assisted range of motion intrinsic digits flexion 2nd through 5th  Followed by composite flexion in place and hold in composite flexion  to foam block - cut down to increase flexion goal for 3rd thru 5th   PROM  of thumb palmar /radial abduction 12 reps  AAROM Opposition to all digits 5 reps focusing on IP and MC flexion, pt reports she has been  trying to focus on oval or "O" with opposition- increase ease  Done and review AAROM for wrist flexion over armrest and prayer stretch 10 reps hold 5-10 sec   PATIENT EDUCATION: Education details: Findings of evaluation and home program Person educated: Patient Education method: Explanation, Demonstration, Tactile cues, Verbal cues, and Handouts Education comprehension: verbalized understanding, returned demonstration, verbal cues required, tactile cues required, and needs further education   GOALS: Goals reviewed with patient? Yes  SHORT TERM GOALS: Target date: 2 wks  Patient to be independent in home program to decrease scar tissue and edema - increased extension and flexion of digits as well as wrist Baseline: Severe stiffness in digits, increased scar tissue-scabs and dry skin removed this date-no knowledge of home program Goal status: INITIAL   LONG TERM GOALS: Target date: 8 wks Flexion of right digits improved for patient to touch palm to initiate use in ADLs with no increase symptoms Baseline: Dry skin and scabs removed today while -MC flexion 30 to 75 degrees, PIP 45 to 100 degrees-discomfort and pain 5/10 Goal status: INITIAL   2.  Patient increase extension of right digits 2nd thru 4th to be able to don gloves, wash face and reach aroundcylinder objects without issues Baseline: Patient arrived with -10 to -30 PIP extension,  Goal status: INITIAL   3.  Scar tissue improved for patient to be able to make composite fist as well as increase extension and tolerate gripping objects Baseline: dry skin and scabs removed today; initiating scar massage but 3 scabs still in place,  unable to make composite fist as well as extension at PIPs -10 to -30 degrees Goal status: INITIAL   4.  Right grip and prehension strength increased to more than 75% compared to the left for patient to hold weight to work out and cook Baseline: scabs removed and dry skin- 3 scabs still in place; flexion  extension still limited, scar massage initiated on some areas 3 weeks postop Goal status: INITIAL   5.  R wrist AROM increase to WNL to push and pull door open without increase symptoms  Baseline:  wrist flxion 35 , ext 48 and pain 5/10 Goal status: INITIAL  ASSESSMENT:  CLINICAL IMPRESSION: Patient presents at occupational therapy evaluation post multiple irrigation and debridements involving right dorsal and volar hand wounds -patient underwent a CT scan of the right hand which demonstrated  a abscess formation along the volar aspect of the hand initially. She underwent 1 irrigation debridement however the patient continued to have swelling and pain prompting a repeat CT scan which demonstrated abscess formation along the dorsal aspect of the hand. The patient underwent a second irrigation debridement and a final third procedure performed by Dr. Joice Lofts on 04/04/2023.  Patient presented for follow-up post evaluation with continued severe scar tissue and tightness in the volar and dorsal hand.  Limiting second and third digits mostly.  Coban flexion wrap in paraffin to increase flexion at second and third digits with focus on decreasing scar tissue.  Home program for patient to use DIP/PIP strap for second digit prior to flexion glove 2 to 3 minutes on both to 3 times after moist heat and scar mobilization and prior to active assisted and active range of motion of digits.  Focus on scar continue with Kinesiotape to dorsal scar for daytime use.   Continued focus on scar management as well as ROM to work towards functional use of the hand.  Focus this date on composite flexion of 3rd thru 5th -and wrist flexion, extention PROM - Patient can benefit from skilled OT services to decrease scar tissue and edema and increase range of motion in flexion and extension of digits as well as wrist, increase strength to return to prior level of function. PERFORMANCE DEFICITS: in functional skills including ADLs, IADLs,  coordination, dexterity, edema, ROM, strength, pain, flexibility, and UE functional use,   and psychosocial skills including habits and routines and behaviors.   IMPAIRMENTS: are limiting patient from ADLs, IADLs, rest and sleep, work, play, leisure, and social participation.   COMORBIDITIES: has no other co-morbidities that affects occupational performance. Patient will benefit from skilled OT to address above impairments and improve overall function.  MODIFICATION OR ASSISTANCE TO COMPLETE EVALUATION: No modification of tasks or assist necessary to complete an evaluation.  OT OCCUPATIONAL PROFILE AND HISTORY: Problem focused assessment: Including review of records relating to presenting problem.  CLINICAL DECISION MAKING: LOW - limited treatment options, no task modification necessary  REHAB POTENTIAL: Good  EVALUATION COMPLEXITY: Low  PLAN:  OT FREQUENCY: 2x/week  OT DURATION: 8 weeks  PLANNED INTERVENTIONS: ADL's therapeutic exercise, therapeutic activity, manual therapy, scar mobilization, passive range of motion, splinting, paraffin, fluidotherapy, contrast bath, patient/family education, and DME and/or AE instructions  CONSULTED AND AGREED WITH PLAN OF CARE: Patient   Oletta Cohn, OTR/L,CLT 05/16/2023, 11:52 AM

## 2023-05-19 ENCOUNTER — Ambulatory Visit: Payer: No Typology Code available for payment source | Admitting: Occupational Therapy

## 2023-05-19 DIAGNOSIS — M6281 Muscle weakness (generalized): Secondary | ICD-10-CM

## 2023-05-19 DIAGNOSIS — M25641 Stiffness of right hand, not elsewhere classified: Secondary | ICD-10-CM

## 2023-05-19 DIAGNOSIS — L905 Scar conditions and fibrosis of skin: Secondary | ICD-10-CM | POA: Diagnosis not present

## 2023-05-19 DIAGNOSIS — M25631 Stiffness of right wrist, not elsewhere classified: Secondary | ICD-10-CM

## 2023-05-19 DIAGNOSIS — R600 Localized edema: Secondary | ICD-10-CM

## 2023-05-19 NOTE — Therapy (Signed)
OUTPATIENT OCCUPATIONAL THERAPY ORTHO TREATMENT  Patient Name: Wanda Smith MRN: [id number] DOB:April 16, 1961, 62 y.o., female   PCP: Merlinda Frederick NP REFERRING PROVIDER: Marney Doctor PA  END OF SESSION:  OT End of Session - 05/19/23 0921     Visit Number 8    Number of Visits 16    Date for OT Re-Evaluation 06/20/23    OT Start Time 0903    OT Stop Time 0948    OT Time Calculation (min) 45 min    Activity Tolerance Patient tolerated treatment well    Behavior During Therapy WFL for tasks assessed/performed             Past Medical History:  Diagnosis Date   Anemia    Anxiety    Arthritis    knee (R)   Asthma    Chronic kidney disease    Stage 3 sees Lateef   COPD (chronic obstructive pulmonary disease) (HCC)    Depression    Diabetes mellitus without complication (HCC)    Dyspnea    GERD (gastroesophageal reflux disease)    occ   Hypertension    Hypothyroidism    Increased serum lipids    Wears contact lenses    Past Surgical History:  Procedure Laterality Date   ABDOMINAL HYSTERECTOMY     CARPAL TUNNEL RELEASE Right 07/26/2018   Procedure: CARPAL TUNNEL RELEASE ENDOSCOPIC;  Surgeon: Christena Flake, MD;  Location: [facility];  Service: Orthopedics;  Laterality: Right;  diabetic - oral meds   CHOLECYSTECTOMY     COLONOSCOPY WITH ESOPHAGOGASTRODUODENOSCOPY (EGD)     COLONOSCOPY WITH PROPOFOL N/A 01/13/2018   Procedure: COLONOSCOPY WITH PROPOFOL;  Surgeon: Scot Jun, MD;  Location: [facility] END[facility];  Service: Endoscopy;  Laterality: N/A;   HERNIA REPAIR     I & D EXTREMITY Right 04/01/2023   Procedure: IRRIGATION AND DEBRIDEMENT EXTREMITY PREVENA PLACEMENT;  Surgeon: Christena Flake, MD;  Location: [facility];  Service: Orthopedics;  Laterality: Right;   INCISION AND DRAINAGE ABSCESS Right 03/30/2023   Procedure: INCISION AND DRAINAGE ABSCESS RIGHT HAND;  Surgeon: Christena Flake, MD;  Location: [facility];  Service: Orthopedics;  Laterality: Right;    INCISION AND DRAINAGE OF WOUND Right 04/04/2023   Procedure: IRRIGATION AND DEBRIDEMENT RIGHT HAND DORSAL AND PALMAR INCISIONS WITH DELAYED PRIMARY CLOSURE;  Surgeon: Christena Flake, MD;  Location: [facility];  Service: Orthopedics;  Laterality: Right;   left rotator cuff repair Left    SHOULDER ARTHROSCOPY WITH SUBACROMIAL DECOMPRESSION, ROTATOR CUFF REPAIR AND BICEP TENDON REPAIR Right 10/21/2021   Procedure: SHOULDER ARTHROSCOPY WITH DEBRIDEMENT, DECOMPRESSION, BICEPS TENODESIS.;  Surgeon: Christena Flake, MD;  Location: [facility];  Service: Orthopedics;  Laterality: Right;   TUBAL LIGATION     Patient Active Problem List   Diagnosis Date Noted   MRSA infection 04/04/2023   Abscess of right hand 03/31/2023   Cellulitis of right hand 03/30/2023   Depression with anxiety 03/30/2023   HLD (hyperlipidemia) 03/30/2023   Type II diabetes mellitus with renal manifestations (HCC) 03/30/2023   Chronic kidney disease, stage 3a (HCC) 03/30/2023   Abnormal LFTs 03/30/2023   Severe sepsis (HCC) 03/30/2023   Hypothyroidism    Hypertension    COPD (chronic obstructive pulmonary disease) (HCC)     ONSET DATE: 03/30/23  REFERRING DIAG:Abscess of R hand -3 irrigation surgeries  THERAPY DIAG:  Scar condition and fibrosis of skin  Localized edema  Stiffness of right hand, not elsewhere classified  Stiffness of right  wrist, not elsewhere classified  Muscle weakness (generalized)  Rationale for Evaluation and Treatment: Rehabilitation  SUBJECTIVE:   SUBJECTIVE STATEMENT: Just stiff in the morning - really tight - seen Orthopedics yesterday- focusing on scar and motion - did wear the buddy strap   pt accompanied by: self  PERTINENT HISTORY:  Ortho note 04/20/23 [name] is a 62 y.o. female who presents today for a skin check status post multiple irrigation and debridements involving right dorsal and volar hand wounds and possible suture removal. The patient was admitted to the hospital  after suffering a dog scratch which eventually became more swollen and painful. The patient underwent a CT scan of the right hand which demonstrated a abscess formation along the volar aspect of the hand initially. She underwent 1 irrigation debridement however the patient continued to have swelling and pain prompting a repeat CT scan which demonstrated abscess formation along the dorsal aspect of the hand. The patient underwent a second irrigation debridement and a final third procedure performed by Dr. Joice Lofts on 04/04/2023. The patient was evaluated last week where skin inspection demonstrated excellent granulation without any significant erythema or purulent drainage. The patient then had a follow-up with infectious disease where repeat CRP came back at a normal level. The patient does still have a oral prescription for linezolid which she is scheduled to take until end this week. She denies any trauma or injury affecting the right hand since her last evaluation. She does report an occasional burning discomfort in the index finger. She is still taking occasional oxycodone. She reports a 0 out of 10 pain score at today's visit. Stitches remove- and to finish antibiotics and refer to OT   PRECAUTIONS: None      WEIGHT BEARING RESTRICTIONS: No  PAIN:  Are you having pain? 5/10 dorsal and second digit - tightness more than pain   FALLS: Has patient fallen in last 6 months? No  LIVING ENVIRONMENT: Lives with: lives with their family and lives with their spouse   PLOF: Work on [employer], gym 3 x wk , do own cooking and housework  PATIENT GOALS: Get my motion and strength back in the right hand so that I can carry and grip and hold objects  NEXT MD VISIT: Middle September OBJECTIVE:   HAND DOMINANCE: Right   UPPER EXTREMITY ROM:     Active ROM Right eval Left eval R  04/29/23 05/03/23 05/16/23 R  Shoulder flexion       Shoulder abduction       Shoulder adduction       Shoulder extension        Shoulder internal rotation       Shoulder external rotation       Elbow flexion       Elbow extension       Wrist flexion 35 110 50 50 63  Wrist extension 48 85 60 55 65  Wrist ulnar deviation 24      Wrist radial deviation 20      Wrist pronation       Wrist supination       (Blank rows = not tested)  Active ROM Right eval Left eval R   04/29/23 R  05/12/23 R  05/16/23  Thumb MCP (0-60) 70      Thumb IP (0-80) 35      Thumb Radial abd/add (0-55) 45    50   Thumb Palmar abd/add (0-45) 45    50   Thumb Opposition  to Small Finger Opposition to 3rd, 4th and 5th     Opposition to all digits   Index MCP (0-90) 30   50 60 60(  Index PIP (0-100) 45( ext -30   55 ( ext -20 60 ( ext -10) 60 ( blocked 70)  Index DIP (0-70)       50  Long MCP (0-90)  45   50 60 65  Long PIP (0-100)  90( ext -20   95 ( ext -15 95 ( ext -5)   Long DIP (0-70)         Ring MCP (0-90)  45   60 70 75  Ring PIP (0-100)  100 (ext -10   100( ext 0  100 - full ext   Ring DIP (0-70)         Little MCP (0-90)  75   75 80   Little PIP (0-100)  90 (ext 0   100 ( ext 0 100- full ext    Little DIP (0-70)         (Blank rows = not tested)    COORDINATION: Decreasing significantly because of stiffness and scar tissue in the second digit  EDEMA: R hand   COGNITION: Overall cognitive status: Within functional limits for tasks assessed    OBSERVATIONS:  Patient arrived with scar adhesion over the volar and dorsal hand limiting mostly second digit severely at [facility] and  PIP flexion as well as composite flexion TODAY'S TREATMENT:                                                                                                                              DATE:  05/16/23  Paraffin : Paraffin to wrist and hand for 8 min total, Coban flexion wrap  done to second and third digits during paraffin -to increase range of motion decrease scar tissue prior to range of motion Recommended for patient to get a paraffin unit to use  at home 3 times a day.  Patient was educated on use as well as precautions.  Manual:   manual therapy extensive focus on scar massage with manual techniques using Coban on dorsal and volar hand and provided patient and educated on use.  Used mini massager  on volar second digit , web space as well as volar scar on hand.  Done extractor on the dorsal scar with active and passive range of motion of second digit.  Metacarpal and carpal spreads as well as webspace massage performed by OT. Pt has Cica -Care scar pad for dorsal and volar scars and silicone sleeve to wear on and off during the day   Done and patient has Kinesiotape to use on dorsal hand scar with 3 in star shape at 100% to facilitate scar mobilization during range of motion daytime  Therapeutic Exercises:  Patient to cont  red foam roller to perform soft tissue massage rolling palm 20 reps prior to range of motion  Patient to continue with interlocking abduction adduction of digits 12 reps each Additional focus on opening second webspace of index and third digit  Composite passive range of motion for 3rd through 5th digits 8 reps. On the third on DIP and PIP 8 reps of passive range of motion prior to composite. Asked patient to do after use of flexion glove prior to composite fist.  Pt has flexion glove with rubber bands for second and third digit to volar wrist for 3 x 3 minutes prior to blocked active range of motion. Cont to use  DIP/PIP flexion strap to use for intrinsic of flexion at second digit to wear 2 x 2 minutes Assess progress and use with both Done and add to HEP for pt this date PROM composite fist rolling on thigh - 10 reps hold 5 sec  OR  Patient to use DIP/PIP strap first 2 x 2 minutes prior to flexion glove 2-3 times for 3 minutes  Increase to 70 degrees to [facility] and PIP at 2nd - Followed by AAROM for MC flexion Followed by active assisted range of motion intrinsic digits flexion 2nd through 5th  Followed by  composite flexion in place and hold in composite flexion  to  2 cm foam block - Add buddy strap to prox and middle phalanges to use 2 hrs on and off during day 2nd to 3rd digit to facilitate flexion and increase use of 2nd digit   PROM  of thumb palmar /radial abduction 12 reps  AAROM Opposition to all digits 5 reps focusing on IP and MC flexion, pt reports she has been trying to focus on oval or "O" with opposition- increase ease  Cont AAROM for wrist flexion over armrest and prayer stretch 10 reps hold 5-10 sec   PATIENT EDUCATION: Education details: Findings of evaluation and home program Person educated: Patient Education method: Explanation, Demonstration, Tactile cues, Verbal cues, and Handouts Education comprehension: verbalized understanding, returned demonstration, verbal cues required, tactile cues required, and needs further education   GOALS: Goals reviewed with patient? Yes  SHORT TERM GOALS: Target date: 2 wks  Patient to be independent in home program to decrease scar tissue and edema - increased extension and flexion of digits as well as wrist Baseline: Severe stiffness in digits, increased scar tissue-scabs and dry skin removed this date-no knowledge of home program Goal status: INITIAL   LONG TERM GOALS: Target date: 8 wks Flexion of right digits improved for patient to touch palm to initiate use in ADLs with no increase symptoms Baseline: Dry skin and scabs removed today while -MC flexion 30 to 75 degrees, PIP 45 to 100 degrees-discomfort and pain 5/10 Goal status: INITIAL   2.  Patient increase extension of right digits 2nd thru 4th to be able to don gloves, wash face and reach aroundcylinder objects without issues Baseline: Patient arrived with -10 to -30 PIP extension,  Goal status: INITIAL   3.  Scar tissue improved for patient to be able to make composite fist as well as increase extension and tolerate gripping objects Baseline: dry skin and scabs removed  today; initiating scar massage but 3 scabs still in place,  unable to make composite fist as well as extension at PIPs -10 to -30 degrees Goal status: INITIAL   4.  Right grip and prehension strength increased to more than 75% compared to the left for patient to hold weight to work out and cook Baseline: scabs removed and dry skin- 3 scabs still in place;  flexion extension still limited, scar massage initiated on some areas 3 weeks postop Goal status: INITIAL   5.  R wrist AROM increase to WNL to push and pull door open without increase symptoms  Baseline:  wrist flxion 35 , ext 48 and pain 5/10 Goal status: INITIAL  ASSESSMENT:  CLINICAL IMPRESSION: Patient presents at occupational therapy evaluation post multiple irrigation and debridements involving right dorsal and volar hand wounds -patient underwent a CT scan of the right hand which demonstrated a abscess formation along the volar aspect of the hand initially. She underwent 1 irrigation debridement however the patient continued to have swelling and pain prompting a repeat CT scan which demonstrated abscess formation along the dorsal aspect of the hand. The patient underwent a second irrigation debridement and a final third procedure performed by Dr. Joice Lofts on 04/04/2023.  Patient presented for follow-up post evaluation with continued severe scar tissue and tightness in the volar and dorsal hand.  Limiting second and third digits mostly.  Coban flexion wrap in paraffin to increase flexion at second and third digits with focus on decreasing scar tissue.  Recommend this date for pt to get paraffin home unit to use at home at least 3 x day - and then composite flexion stretch on thigh- Cont to do HEP using DIP/PIP strap for second digit prior to flexion glove 2 to 3 minutes on both to 3 times after moist heat and scar mobilization and prior to active assisted and active range of motion of digits.  Focus on scar continue with Kinesiotape to dorsal scar  for daytime use.   Continued focus on scar management as well as ROM to work towards functional use of the hand.  Focus this date on composite flexion of 3rd thru 5th -and wrist flexion, extention PROM - Patient can benefit from skilled OT services to decrease scar tissue and edema and increase range of motion in flexion and extension of digits as well as wrist, increase strength to return to prior level of function. PERFORMANCE DEFICITS: in functional skills including ADLs, IADLs, coordination, dexterity, edema, ROM, strength, pain, flexibility, and UE functional use,   and psychosocial skills including habits and routines and behaviors.   IMPAIRMENTS: are limiting patient from ADLs, IADLs, rest and sleep, work, play, leisure, and social participation.   COMORBIDITIES: has no other co-morbidities that affects occupational performance. Patient will benefit from skilled OT to address above impairments and improve overall function.  MODIFICATION OR ASSISTANCE TO COMPLETE EVALUATION: No modification of tasks or assist necessary to complete an evaluation.  OT OCCUPATIONAL PROFILE AND HISTORY: Problem focused assessment: Including review of records relating to presenting problem.  CLINICAL DECISION MAKING: LOW - limited treatment options, no task modification necessary  REHAB POTENTIAL: Good  EVALUATION COMPLEXITY: Low  PLAN:  OT FREQUENCY: 2x/week  OT DURATION: 8 weeks  PLANNED INTERVENTIONS: ADL's therapeutic exercise, therapeutic activity, manual therapy, scar mobilization, passive range of motion, splinting, paraffin, fluidotherapy, contrast bath, patient/family education, and DME and/or AE instructions  CONSULTED AND AGREED WITH PLAN OF CARE: Patient   Oletta Cohn, OTR/L,CLT 05/19/2023, 10:20 AM

## 2023-05-23 ENCOUNTER — Ambulatory Visit: Payer: No Typology Code available for payment source | Admitting: Occupational Therapy

## 2023-05-23 DIAGNOSIS — M6281 Muscle weakness (generalized): Secondary | ICD-10-CM

## 2023-05-23 DIAGNOSIS — M25631 Stiffness of right wrist, not elsewhere classified: Secondary | ICD-10-CM

## 2023-05-23 DIAGNOSIS — R600 Localized edema: Secondary | ICD-10-CM

## 2023-05-23 DIAGNOSIS — L905 Scar conditions and fibrosis of skin: Secondary | ICD-10-CM | POA: Diagnosis not present

## 2023-05-23 DIAGNOSIS — M25641 Stiffness of right hand, not elsewhere classified: Secondary | ICD-10-CM

## 2023-05-23 NOTE — Therapy (Signed)
OUTPATIENT OCCUPATIONAL THERAPY ORTHO TREATMENT  Patient Name: Wanda Smith MRN: [id number] DOB:07-14-1961, 62 y.o., female   PCP: Merlinda Frederick NP REFERRING PROVIDER: Marney Doctor PA  END OF SESSION:  OT End of Session - 05/23/23 0903     Visit Number 9    Number of Visits 16    Date for OT Re-Evaluation 06/20/23    OT Start Time 0903    OT Stop Time 0948    OT Time Calculation (min) 45 min    Activity Tolerance Patient tolerated treatment well    Behavior During Therapy WFL for tasks assessed/performed             Past Medical History:  Diagnosis Date   Anemia    Anxiety    Arthritis    knee (R)   Asthma    Chronic kidney disease    Stage 3 sees [place]ef   COPD (chronic obstructive pulmonary disease) (HCC)    Depression    Diabetes mellitus without complication (HCC)    Dyspnea    GERD (gastroesophageal reflux disease)    occ   Hypertension    Hypothyroidism    Increased serum lipids    Wears contact lenses    Past Surgical History:  Procedure Laterality Date   ABDOMINAL HYSTERECTOMY     CARPAL TUNNEL RELEASE Right 07/26/2018   Procedure: CARPAL TUNNEL RELEASE ENDOSCOPIC;  Surgeon: Christena Flake, MD;  Location: [facility];  Service: Orthopedics;  Laterality: Right;  diabetic - oral meds   CHOLECYSTECTOMY     COLONOSCOPY WITH ESOPHAGOGASTRODUODENOSCOPY (EGD)     COLONOSCOPY WITH PROPOFOL N/A 01/13/2018   Procedure: COLONOSCOPY WITH PROPOFOL;  Surgeon: Scot Jun, MD;  Location: [facility];  Service: Endoscopy;  Laterality: N/A;   HERNIA REPAIR     I & D EXTREMITY Right 04/01/2023   Procedure: IRRIGATION AND DEBRIDEMENT EXTREMITY PREVENA PLACEMENT;  Surgeon: Christena Flake, MD;  Location: [facility];  Service: Orthopedics;  Laterality: Right;   INCISION AND DRAINAGE ABSCESS Right 03/30/2023   Procedure: INCISION AND DRAINAGE ABSCESS RIGHT HAND;  Surgeon: Christena Flake, MD;  Location: [facility];  Service: Orthopedics;  Laterality: Right;    INCISION AND DRAINAGE OF WOUND Right 04/04/2023   Procedure: IRRIGATION AND DEBRIDEMENT RIGHT HAND DORSAL AND PALMAR INCISIONS WITH DELAYED PRIMARY CLOSURE;  Surgeon: Christena Flake, MD;  Location: [facility];  Service: Orthopedics;  Laterality: Right;   left rotator cuff repair Left    SHOULDER ARTHROSCOPY WITH SUBACROMIAL DECOMPRESSION, ROTATOR CUFF REPAIR AND BICEP TENDON REPAIR Right 10/21/2021   Procedure: SHOULDER ARTHROSCOPY WITH DEBRIDEMENT, DECOMPRESSION, BICEPS TENODESIS.;  Surgeon: Christena Flake, MD;  Location: [facility];  Service: Orthopedics;  Laterality: Right;   TUBAL LIGATION     Patient Active Problem List   Diagnosis Date Noted   MRSA infection 04/04/2023   Abscess of right hand 03/31/2023   Cellulitis of right hand 03/30/2023   Depression with anxiety 03/30/2023   HLD (hyperlipidemia) 03/30/2023   Type II diabetes mellitus with renal manifestations (HCC) 03/30/2023   Chronic kidney disease, stage 3a (HCC) 03/30/2023   Abnormal LFTs 03/30/2023   Severe sepsis (HCC) 03/30/2023   Hypothyroidism    Hypertension    COPD (chronic obstructive pulmonary disease) (HCC)     ONSET DATE: 03/30/23  REFERRING DIAG:Abscess of R hand -3 irrigation surgeries  THERAPY DIAG:  Scar condition and fibrosis of skin  Localized edema  Stiffness of right hand, not elsewhere classified  Stiffness of right  wrist, not elsewhere classified  Muscle weakness (generalized)  Rationale for Evaluation and Treatment: Rehabilitation  SUBJECTIVE:   SUBJECTIVE STATEMENT: I try and use it more - make it work my hand- really tight , stiff and swollen with this rainy weather - even my knees  pt accompanied by: self  PERTINENT HISTORY:  Ortho note 04/20/23 Wanda Smith is a 62 y.o. female who presents today for a skin check status post multiple irrigation and debridements involving right dorsal and volar hand wounds and possible suture removal. The patient was admitted to the hospital after  suffering a dog scratch which eventually became more swollen and painful. The patient underwent a CT scan of the right hand which demonstrated a abscess formation along the volar aspect of the hand initially. She underwent 1 irrigation debridement however the patient continued to have swelling and pain prompting a repeat CT scan which demonstrated abscess formation along the dorsal aspect of the hand. The patient underwent a second irrigation debridement and a final third procedure performed by Dr. Joice Lofts on 04/04/2023. The patient was evaluated last week where skin inspection demonstrated excellent granulation without any significant erythema or purulent drainage. The patient then had a follow-up with infectious disease where repeat CRP came back at a normal level. The patient does still have a oral prescription for linezolid which she is scheduled to take until end this week. She denies any trauma or injury affecting the right hand since her last evaluation. She does report an occasional burning discomfort in the index finger. She is still taking occasional oxycodone. She reports a 0 out of 10 pain score at today's visit. Stitches remove- and to finish antibiotics and refer to OT   PRECAUTIONS: None      WEIGHT BEARING RESTRICTIONS: No  PAIN:  Are you having pain? tightness more than pain   FALLS: Has patient fallen in last 6 months? No  LIVING ENVIRONMENT: Lives with: lives with their family and lives with their spouse   PLOF: Work on [employer], gym 3 x wk , do own cooking and housework  PATIENT GOALS: Get my motion and strength back in the right hand so that I can carry and grip and hold objects  NEXT MD VISIT: Middle September OBJECTIVE:   HAND DOMINANCE: Right   UPPER EXTREMITY ROM:     Active ROM Right eval Left eval R  04/29/23 05/03/23 05/16/23 R 9/16/24R  Shoulder flexion        Shoulder abduction        Shoulder adduction        Shoulder extension        Shoulder internal  rotation        Shoulder external rotation        Elbow flexion        Elbow extension        Wrist flexion 35 110 50 50 63 72  Wrist extension 48 85 60 55 65 70  Wrist ulnar deviation 24       Wrist radial deviation 20       Wrist pronation        Wrist supination        (Blank rows = not tested)  Active ROM Right eval Left eval R   04/29/23 R  05/12/23 R  05/16/23 R 05/23/23  Thumb MCP (0-60) 70       Thumb IP (0-80) 35       Thumb Radial abd/add (0-55) 45    50  50  Thumb Palmar abd/add (0-45) 45    50  65  Thumb Opposition to Small Finger Opposition to 3rd, 4th and 5th     Opposition to all digits    Index MCP (0-90) 30   50 60 60( 65  Index PIP (0-100) 45( ext -30   55 ( ext -20 60 ( ext -10) 60 ( blocked 70) 60  Index DIP (0-70)       50   Long MCP (0-90)  45   50 60 65 70  Long PIP (0-100)  90( ext -20   95 ( ext -15 95 ( ext -5)  100  Long DIP (0-70)          Ring MCP (0-90)  45   60 70 75 75  Ring PIP (0-100)  100 (ext -10   100( ext 0  100 - full ext  100  Ring DIP (0-70)          Little MCP (0-90)  75   75 80  80  Little PIP (0-100)  90 (ext 0   100 ( ext 0 100- full ext   100  Little DIP (0-70)          (Blank rows = not tested) In session PROM and AROM increase to 80-85 degrees at 3rd thru 5th MC and 2nd MC 70 and PIP 65 composite flexion   05/23/23 GRIP R 14 lbs L 55 lbs, lat pinch R 4 lbs L 14 lbs and 3 point pinch R 5 lbs and L 10 lbs  COORDINATION: Decreasing significantly because of stiffness and scar tissue in the second digit  EDEMA: R hand   COGNITION: Overall cognitive status: Within functional limits for tasks assessed    OBSERVATIONS:  Patient arrived with scar adhesion over the volar and dorsal hand limiting mostly second digit severely at [facility] and  PIP flexion as well as composite flexion TODAY'S TREATMENT:                                                                                                                              DATE:   05/16/23  Paraffin : Paraffin to wrist and hand for 8 min total, Coban flexion wrap  done to second and third digits during paraffin -to increase range of motion decrease scar tissue prior to range of motion Recommended for patient to get a paraffin unit to use at home 3 times a day.  Patient was educated on use as well as precautions.  Manual:   manual therapy extensive focus on scar massage with manual techniques using Coban on dorsal and volar hand and provided patient and educated on use.  Used mini massager  on volar second digit , web space as well as volar scar on hand.  Done extractor on the dorsal scar with active and passive range of motion of second digit.  Metacarpal and carpal spreads as well as webspace massage  performed by OT. Pt has Cica -Care scar pad for dorsal and volar scars and silicone sleeve to wear on and off during the day   Done and patient has Kinesiotape to use on dorsal hand scar with 3 in star shape at 100% to facilitate scar mobilization during range of motion daytime  Therapeutic Exercises:  Patient to cont  red foam roller to perform soft tissue massage rolling palm 20 reps prior to range of motion  Patient to continue with interlocking abduction adduction of digits 12 reps each Additional focus on opening second webspace of index and third digit  Composite passive range of motion for 3rd through 5th digits 8 reps. On the third on DIP and PIP 8 reps of passive range of motion prior to composite. Asked patient to do after use of flexion glove prior to composite fist.  Pt has flexion glove with rubber bands for second and third digit to volar wrist for 3 x 3 minutes prior to blocked active range of motion. Cont to use  DIP/PIP flexion strap to use for intrinsic of flexion at second digit to wear 2 x 2 minutes Assess progress and use with both Done and add to HEP for pt this date PROM composite fist rolling on thigh - 10 reps hold 5 sec  OR  Patient to use DIP/PIP  strap first 2 x 2 minutes prior to flexion glove 2-3 times for 3 minutes Then light blue putty composite fist , lat pinch and 3 point pinch - 20 reps  After PROM and AROM   Cont at home  Add buddy strap to prox and middle phalanges to use 2 hrs on and off during day 2nd to 3rd digit to facilitate flexion and increase use of 2nd digit   PROM  of thumb palmar /radial abduction 12 reps  AAROM Opposition to all digits 5 reps focusing on IP and MC flexion, pt reports she has been trying to focus on oval or "O" with opposition- increase ease  Cont AAROM for wrist flexion over armrest and prayer stretch 10 reps hold 5-10 sec  Progress well in flexion and ext of wrist the last week or 2   PATIENT EDUCATION: Education details: Findings of evaluation and home program Person educated: Patient Education method: Explanation, Demonstration, Tactile cues, Verbal cues, and Handouts Education comprehension: verbalized understanding, returned demonstration, verbal cues required, tactile cues required, and needs further education   GOALS: Goals reviewed with patient? Yes  SHORT TERM GOALS: Target date: 2 wks  Patient to be independent in home program to decrease scar tissue and edema - increased extension and flexion of digits as well as wrist Baseline: Severe stiffness in digits, increased scar tissue-scabs and dry skin removed this date-no knowledge of home program Goal status: INITIAL   LONG TERM GOALS: Target date: 8 wks Flexion of right digits improved for patient to touch palm to initiate use in ADLs with no increase symptoms Baseline: Dry skin and scabs removed today while -MC flexion 30 to 75 degrees, PIP 45 to 100 degrees-discomfort and pain 5/10 Goal status: INITIAL   2.  Patient increase extension of right digits 2nd thru 4th to be able to don gloves, wash face and reach aroundcylinder objects without issues Baseline: Patient arrived with -10 to -30 PIP extension,  Goal status: INITIAL    3.  Scar tissue improved for patient to be able to make composite fist as well as increase extension and tolerate gripping objects Baseline: dry skin and  scabs removed today; initiating scar massage but 3 scabs still in place,  unable to make composite fist as well as extension at PIPs -10 to -30 degrees Goal status: INITIAL   4.  Right grip and prehension strength increased to more than 75% compared to the left for patient to hold weight to work out and cook Baseline: scabs removed and dry skin- 3 scabs still in place; flexion extension still limited, scar massage initiated on some areas 3 weeks postop Goal status: INITIAL   5.  R wrist AROM increase to WNL to push and pull door open without increase symptoms  Baseline:  wrist flxion 35 , ext 48 and pain 5/10 Goal status: INITIAL  ASSESSMENT:  CLINICAL IMPRESSION: Patient presents at occupational therapy evaluation post multiple irrigation and debridements involving right dorsal and volar hand wounds -patient underwent a CT scan of the right hand which demonstrated a abscess formation along the volar aspect of the hand initially. She underwent 1 irrigation debridement however the patient continued to have swelling and pain prompting a repeat CT scan which demonstrated abscess formation along the dorsal aspect of the hand. The patient underwent a second irrigation debridement and a final third procedure performed by Dr. Joice Lofts on 04/04/2023.  Patient presented for follow-up post evaluation with continued severe scar tissue and tightness in the volar and dorsal hand.  Limiting second and third digits mostly.  Coban flexion wrap in paraffin to increase flexion at second and third digits with focus on decreasing scar tissue.  Recommend this date for pt to get paraffin home unit to use at home at least 3 x day - and then composite flexion stretch on thigh- Cont to do HEP using DIP/PIP strap for second digit prior to flexion glove 2 to 3 minutes on both to  3 times after moist heat and scar mobilization and prior to active assisted and active range of motion of digits.  Focus on scar continue with Kinesiotape to dorsal scar for daytime use.   Continued focus on scar management as well as ROM to work towards functional use of the hand.  Focus on composite flexion of 3rd thru 5th- as well as 2nd - add this date light blue putty for gripping and prehension strength  -and wrist flexion, extention PROM - Patient can benefit from skilled OT services to decrease scar tissue and edema and increase range of motion in flexion and extension of digits as well as wrist, increase strength to return to prior level of function. PERFORMANCE DEFICITS: in functional skills including ADLs, IADLs, coordination, dexterity, edema, ROM, strength, pain, flexibility, and UE functional use,   and psychosocial skills including habits and routines and behaviors.   IMPAIRMENTS: are limiting patient from ADLs, IADLs, rest and sleep, work, play, leisure, and social participation.   COMORBIDITIES: has no other co-morbidities that affects occupational performance. Patient will benefit from skilled OT to address above impairments and improve overall function.  MODIFICATION OR ASSISTANCE TO COMPLETE EVALUATION: No modification of tasks or assist necessary to complete an evaluation.  OT OCCUPATIONAL PROFILE AND HISTORY: Problem focused assessment: Including review of records relating to presenting problem.  CLINICAL DECISION MAKING: LOW - limited treatment options, no task modification necessary  REHAB POTENTIAL: Good  EVALUATION COMPLEXITY: Low  PLAN:  OT FREQUENCY: 2x/week  OT DURATION: 8 weeks  PLANNED INTERVENTIONS: ADL's therapeutic exercise, therapeutic activity, manual therapy, scar mobilization, passive range of motion, splinting, paraffin, fluidotherapy, contrast bath, patient/family education, and DME and/or AE instructions  CONSULTED AND AGREED WITH PLAN OF CARE:  Patient   Oletta Cohn, OTR/L,CLT 05/23/2023, 9:50 AM

## 2023-05-26 ENCOUNTER — Ambulatory Visit: Payer: No Typology Code available for payment source | Admitting: Occupational Therapy

## 2023-05-26 DIAGNOSIS — M6281 Muscle weakness (generalized): Secondary | ICD-10-CM

## 2023-05-26 DIAGNOSIS — R600 Localized edema: Secondary | ICD-10-CM

## 2023-05-26 DIAGNOSIS — L905 Scar conditions and fibrosis of skin: Secondary | ICD-10-CM

## 2023-05-26 DIAGNOSIS — M25631 Stiffness of right wrist, not elsewhere classified: Secondary | ICD-10-CM

## 2023-05-26 DIAGNOSIS — M25641 Stiffness of right hand, not elsewhere classified: Secondary | ICD-10-CM

## 2023-05-26 NOTE — Therapy (Signed)
OUTPATIENT OCCUPATIONAL THERAPY ORTHO TREATMENT/10th visit  Patient Name: Wanda Smith MRN: [id number] DOB:1960/09/16, 62 y.o., female   PCP: Wanda Frederick NP REFERRING PROVIDER: Marney Doctor PA  END OF SESSION:  OT End of Session - 05/26/23 1026     Visit Number 10    Number of Visits 16    Date for OT Re-Evaluation 06/20/23    OT Start Time 0904    OT Stop Time 0951    OT Time Calculation (min) 47 min    Activity Tolerance Patient tolerated treatment well    Behavior During Therapy WFL for tasks assessed/performed             Past Medical History:  Diagnosis Date   Anemia    Anxiety    Arthritis    knee (R)   Asthma    Chronic kidney disease    Stage 3 sees Wanda Smith   COPD (chronic obstructive pulmonary disease) (HCC)    Depression    Diabetes mellitus without complication (HCC)    Dyspnea    GERD (gastroesophageal reflux disease)    occ   Hypertension    Hypothyroidism    Increased serum lipids    Wears contact lenses    Past Surgical History:  Procedure Laterality Date   ABDOMINAL HYSTERECTOMY     CARPAL TUNNEL RELEASE Right 07/26/2018   Procedure: CARPAL TUNNEL RELEASE ENDOSCOPIC;  Surgeon: Wanda Flake, MD;  Location: [facility];  Service: Orthopedics;  Laterality: Right;  diabetic - oral meds   CHOLECYSTECTOMY     COLONOSCOPY WITH ESOPHAGOGASTRODUODENOSCOPY (EGD)     COLONOSCOPY WITH PROPOFOL N/A 01/13/2018   Procedure: COLONOSCOPY WITH PROPOFOL;  Surgeon: Wanda Jun, MD;  Location: [facility];  Service: Endoscopy;  Laterality: N/A;   HERNIA REPAIR     I & D EXTREMITY Right 04/01/2023   Procedure: IRRIGATION AND DEBRIDEMENT EXTREMITY PREVENA PLACEMENT;  Surgeon: Wanda Flake, MD;  Location: [facility];  Service: Orthopedics;  Laterality: Right;   INCISION AND DRAINAGE ABSCESS Right 03/30/2023   Procedure: INCISION AND DRAINAGE ABSCESS RIGHT HAND;  Surgeon: Wanda Flake, MD;  Location: [facility];  Service: Orthopedics;  Laterality:  Right;   INCISION AND DRAINAGE OF WOUND Right 04/04/2023   Procedure: IRRIGATION AND DEBRIDEMENT RIGHT HAND DORSAL AND PALMAR INCISIONS WITH DELAYED PRIMARY CLOSURE;  Surgeon: Wanda Flake, MD;  Location: [facility];  Service: Orthopedics;  Laterality: Right;   left rotator cuff repair Left    SHOULDER ARTHROSCOPY WITH SUBACROMIAL DECOMPRESSION, ROTATOR CUFF REPAIR AND BICEP TENDON REPAIR Right 10/21/2021   Procedure: SHOULDER ARTHROSCOPY WITH DEBRIDEMENT, DECOMPRESSION, BICEPS TENODESIS.;  Surgeon: Wanda Flake, MD;  Location: [facility];  Service: Orthopedics;  Laterality: Right;   TUBAL LIGATION     Patient Active Problem List   Diagnosis Date Noted   MRSA infection 04/04/2023   Abscess of right hand 03/31/2023   Cellulitis of right hand 03/30/2023   Depression with anxiety 03/30/2023   HLD (hyperlipidemia) 03/30/2023   Type II diabetes mellitus with renal manifestations (HCC) 03/30/2023   Chronic kidney disease, stage 3a (HCC) 03/30/2023   Abnormal LFTs 03/30/2023   Severe sepsis (HCC) 03/30/2023   Hypothyroidism    Hypertension    COPD (chronic obstructive pulmonary disease) (HCC)     ONSET DATE: 03/30/23  REFERRING DIAG:Abscess of R hand -3 irrigation surgeries  THERAPY DIAG:  Scar condition and fibrosis of skin  Localized edema  Stiffness of right hand, not elsewhere classified  Stiffness of  right wrist, not elsewhere classified  Muscle weakness (generalized)  Rationale for Evaluation and Treatment: Rehabilitation  SUBJECTIVE:   SUBJECTIVE STATEMENT: I try and use it more - make it work my hand- really tight , stiff  more than pain  -  pt accompanied by: self  PERTINENT HISTORY:  Ortho note 04/20/23 Wanda Smith is a 62 y.o. female who presents today for a skin check status post multiple irrigation and debridements involving right dorsal and volar hand wounds and possible suture removal. The patient was admitted to the hospital after suffering a dog scratch  which eventually became more swollen and painful. The patient underwent a CT scan of the right hand which demonstrated a abscess formation along the volar aspect of the hand initially. She underwent 1 irrigation debridement however the patient continued to have swelling and pain prompting a repeat CT scan which demonstrated abscess formation along the dorsal aspect of the hand. The patient underwent a second irrigation debridement and a final third procedure performed by Dr. Joice Smith on 04/04/2023. The patient was evaluated last week where skin inspection demonstrated excellent granulation without any significant erythema or purulent drainage. The patient then had a follow-up with infectious disease where repeat CRP came back at a normal level. The patient does still have a oral prescription for linezolid which she is scheduled to take until end this week. She denies any trauma or injury affecting the right hand since her last evaluation. She does report an occasional burning discomfort in the index finger. She is still taking occasional oxycodone. She reports a 0 out of 10 pain score at today's visit. Stitches remove- and to finish antibiotics and refer to OT   PRECAUTIONS: None      WEIGHT BEARING RESTRICTIONS: No  PAIN:  Are you having pain? tightness more than pain   FALLS: Has patient fallen in last 6 months? No  LIVING ENVIRONMENT: Lives with: lives with their family and lives with their spouse   PLOF: Work on [employer], gym 3 x wk , do own cooking and housework  PATIENT GOALS: Get my motion and strength back in the right hand so that I can carry and grip and hold objects  NEXT MD VISIT: Middle September OBJECTIVE:   HAND DOMINANCE: Right   UPPER EXTREMITY ROM:     Active ROM Right eval Left eval R  04/29/23 05/03/23 05/16/23 R 9/16/24R  Shoulder flexion        Shoulder abduction        Shoulder adduction        Shoulder extension        Shoulder internal rotation         Shoulder external rotation        Elbow flexion        Elbow extension        Wrist flexion 35 110 50 50 63 72  Wrist extension 48 85 60 55 65 70  Wrist ulnar deviation 24       Wrist radial deviation 20       Wrist pronation        Wrist supination        (Blank rows = not tested)  Active ROM Right eval Left eval R   04/29/23 R  05/12/23 R  05/16/23 R 05/23/23  Thumb MCP (0-60) 70       Thumb IP (0-80) 35       Thumb Radial abd/add (0-55) 45    50  50  Thumb Palmar abd/add (0-45) 45    50  65  Thumb Opposition to Small Finger Opposition to 3rd, 4th and 5th     Opposition to all digits    Index MCP (0-90) 30   50 60 60( 65  Index PIP (0-100) 45( ext -30   55 ( ext -20 60 ( ext -10) 60 ( blocked 70) 60  Index DIP (0-70)       50   Long MCP (0-90)  45   50 60 65 70  Long PIP (0-100)  90( ext -20   95 ( ext -15 95 ( ext -5)  100  Long DIP (0-70)          Ring MCP (0-90)  45   60 70 75 75  Ring PIP (0-100)  100 (ext -10   100( ext 0  100 - full ext  100  Ring DIP (0-70)          Little MCP (0-90)  75   75 80  80  Little PIP (0-100)  90 (ext 0   100 ( ext 0 100- full ext   100  Little DIP (0-70)          (Blank rows = not tested) In session PROM and AROM increase to 80-85 degrees at 3rd thru 5th MC and 2nd MC 70 and PIP 65 composite flexion   05/23/23 GRIP R 14 lbs L 55 lbs, lat pinch R 4 lbs L 14 lbs and 3 point pinch R 5 lbs and L 10 lbs    COORDINATION: Decreasing significantly because of stiffness and scar tissue in the second digit  EDEMA: R hand   COGNITION: Overall cognitive status: Within functional limits for tasks assessed    OBSERVATIONS:  Patient arrived with scar adhesion over the volar and dorsal hand limiting mostly second digit severely at [facility] and  PIP flexion as well as composite flexion TODAY'S TREATMENT:                                                                                                                              DATE:  05/26/23  Paraffin :  Paraffin to wrist and hand for 8 min total, Coban flexion wrap  done to second and third digits during paraffin -to increase range of motion decrease scar tissue prior to range of motion Patient did get a paraffin bath -arrived yesterday.  She needs to get more paraffin reviewed with patient again use of paraffin Manual:   manual therapy extensive focus on scar massage with manual techniques using Coban on dorsal and volar hand and provided patient and educated on use.  Used mini massager  on volar second digit , web space as well as volar scar on hand.  Done extractor on the dorsal scar with active and passive range of motion of second digit.  Metacarpal and carpal spreads as well as webspace massage performed by OT. Pt has  [employer] -Care scar pad for dorsal and volar scars and silicone sleeve to wear on and off during the day   Done and patient has Kinesiotape to use on dorsal hand scar with 3 in star shape at 100% to facilitate scar mobilization during range of motion daytime  Therapeutic Exercises:  Change pt to use teal med putty for rolling  volar palm and scar - CT spread and MC spreads -and rolling over palm for soft tissue mobs   Patient to continue with interlocking abduction adduction of digits 12 reps each Additional focus on opening second webspace of index and third digit  Composite passive range of motion for 3rd through 5th digits 8 reps. On the third on DIP and PIP 8 reps of passive range of motion prior to composite. Asked patient to do after use of flexion glove prior to composite fist.  Pt has flexion glove with rubber bands for second and third digit to volar wrist for 3 x 3 minutes prior to blocked active range of motion.- adjust increase pull on 3rd  Cont to use  DIP/PIP flexion strap to use for intrinsic of flexion at second digit to wear 2 x 2 minutes Assess progress and use with both Done and add to HEP for pt this date PROM composite fist rolling on thigh  or into light blue  putty - 10 reps hold 5 sec  OR  Patient to use DIP/PIP strap first 2 x 2 minutes prior to flexion glove 2-3 times for 3 minutes Then light blue putty composite fist , lat pinch and 3 point pinch - 20 reps  After PROM and AROM   Cont at home  Add buddy strap to prox and middle phalanges to use 2 hrs on and off during day 2nd to 3rd digit to facilitate flexion and increase use of 2nd digit   PROM  of thumb palmar /radial abduction 12 reps  AAROM Opposition to all digits 5 reps focusing on IP and MC flexion, pt reports she has been trying to focus on oval or "O" with opposition- increase ease  Cont AAROM for wrist flexion over armrest and prayer stretch 10 reps hold 5-10 sec  Progress well in flexion and ext of wrist the last week or 2 - done and review AAROM stretches  Followed by 1 lbs weight - for RD, UD , sup /pron -and then flexion , ext of wrist  12 reps  2 x day   Light blue putty for gripping  And teal putty for lat and 3 point point pinch  12 reps  2 x day   PATIENT EDUCATION: Education details: Findings of evaluation and home program Person educated: Patient Education method: Explanation, Demonstration, Tactile cues, Verbal cues, and Handouts Education comprehension: verbalized understanding, returned demonstration, verbal cues required, tactile cues required, and needs further education   GOALS: Goals reviewed with patient? Yes  SHORT TERM GOALS: Target date: 2 wks  Patient to be independent in home program to decrease scar tissue and edema - increased extension and flexion of digits as well as wrist Baseline: Severe stiffness in digits, increased scar tissue-scabs and dry skin removed this date-no knowledge of home program Goal status: INITIAL   LONG TERM GOALS: Target date: 8 wks Flexion of right digits improved for patient to touch palm to initiate use in ADLs with no increase symptoms Baseline: Dry skin and scabs removed today while -MC flexion 30 to 75 degrees,  PIP 45 to 100 degrees-discomfort and pain  5/10 Goal status: INITIAL   2.  Patient increase extension of right digits 2nd thru 4th to be able to don gloves, wash face and reach aroundcylinder objects without issues Baseline: Patient arrived with -10 to -30 PIP extension,  Goal status: INITIAL   3.  Scar tissue improved for patient to be able to make composite fist as well as increase extension and tolerate gripping objects Baseline: dry skin and scabs removed today; initiating scar massage but 3 scabs still in place,  unable to make composite fist as well as extension at PIPs -10 to -30 degrees Goal status: INITIAL   4.  Right grip and prehension strength increased to more than 75% compared to the left for patient to hold weight to work out and cook Baseline: scabs removed and dry skin- 3 scabs still in place; flexion extension still limited, scar massage initiated on some areas 3 weeks postop Goal status: INITIAL   5.  R wrist AROM increase to WNL to push and pull door open without increase symptoms  Baseline:  wrist flxion 35 , ext 48 and pain 5/10 Goal status: INITIAL  ASSESSMENT:  CLINICAL IMPRESSION: Patient presents at occupational therapy evaluation post multiple irrigation and debridements involving right dorsal and volar hand wounds -patient underwent a CT scan of the right hand which demonstrated a abscess formation along the volar aspect of the hand initially. She underwent 1 irrigation debridement however the patient continued to have swelling and pain prompting a repeat CT scan which demonstrated abscess formation along the dorsal aspect of the hand. The patient underwent a second irrigation debridement and a final third procedure performed by Dr. Joice Smith on 04/04/2023.  Patient presented for follow-up post evaluation with continued severe scar tissue and tightness in the volar and dorsal hand.  Limiting second and third digits mostly.  Patient seen for 10 visits made great progress  and extension of digits as well as progressing well and flexion at metacarpals.  Continue to have motion stiffness in second digit MCP and PIP as well as composite flexion.  Stiffness mostly at 3rd through 5th metacarpal, PIP within normal limits.  Initiated this week light strengthening for wrist and grip and prehension.  Patient mostly limited by scar tissue causing severe tightness and stiffness mostly in the second and third metacarpal and digits more than pain reported.  Also increased edema over the area.  Patient can benefit from skilled OT services to decrease scar tissue and edema and increase range of motion in flexion and extension of digits as well as wrist, increase strength to return to prior level of function. PERFORMANCE DEFICITS: in functional skills including ADLs, IADLs, coordination, dexterity, edema, ROM, strength, pain, flexibility, and UE functional use,   and psychosocial skills including habits and routines and behaviors.   IMPAIRMENTS: are limiting patient from ADLs, IADLs, rest and sleep, work, play, leisure, and social participation.   COMORBIDITIES: has no other co-morbidities that affects occupational performance. Patient will benefit from skilled OT to address above impairments and improve overall function.  MODIFICATION OR ASSISTANCE TO COMPLETE EVALUATION: No modification of tasks or assist necessary to complete an evaluation.  OT OCCUPATIONAL PROFILE AND HISTORY: Problem focused assessment: Including review of records relating to presenting problem.  CLINICAL DECISION MAKING: LOW - limited treatment options, no task modification necessary  REHAB POTENTIAL: Good  EVALUATION COMPLEXITY: Low  PLAN:  OT FREQUENCY: 2x/week  OT DURATION: 8 weeks  PLANNED INTERVENTIONS: ADL's therapeutic exercise, therapeutic activity, manual therapy, scar mobilization, passive  range of motion, splinting, paraffin, fluidotherapy, contrast bath, patient/family education, and DME  and/or AE instructions  CONSULTED AND AGREED WITH PLAN OF CARE: Patient   Oletta Cohn, OTR/L,CLT 05/26/2023, 5:49 PM

## 2023-05-30 ENCOUNTER — Ambulatory Visit: Payer: No Typology Code available for payment source | Admitting: Occupational Therapy

## 2023-05-30 DIAGNOSIS — L905 Scar conditions and fibrosis of skin: Secondary | ICD-10-CM | POA: Diagnosis not present

## 2023-05-30 DIAGNOSIS — M25641 Stiffness of right hand, not elsewhere classified: Secondary | ICD-10-CM

## 2023-05-30 DIAGNOSIS — M6281 Muscle weakness (generalized): Secondary | ICD-10-CM

## 2023-05-30 DIAGNOSIS — R600 Localized edema: Secondary | ICD-10-CM

## 2023-05-30 DIAGNOSIS — M25631 Stiffness of right wrist, not elsewhere classified: Secondary | ICD-10-CM

## 2023-05-30 NOTE — Therapy (Signed)
OUTPATIENT OCCUPATIONAL THERAPY ORTHO TREATMENT  Patient Name: Wanda Smith MRN: [id number] DOB:02-Aug-1961, 62 y.o., female   PCP: Wanda Frederick NP REFERRING PROVIDER: Marney Doctor PA  END OF SESSION:  OT End of Session - 05/30/23 0819     Visit Number 11    Number of Visits 16    Date for OT Re-Evaluation 06/20/23    OT Start Time 0819    OT Stop Time 0900    OT Time Calculation (min) 41 min    Activity Tolerance Patient tolerated treatment well    Behavior During Therapy WFL for tasks assessed/performed             Past Medical History:  Diagnosis Date   Anemia    Anxiety    Arthritis    knee (R)   Asthma    Chronic kidney disease    Stage 3 sees Wanda Smith   COPD (chronic obstructive pulmonary disease) (HCC)    Depression    Diabetes mellitus without complication (HCC)    Dyspnea    GERD (gastroesophageal reflux disease)    occ   Hypertension    Hypothyroidism    Increased serum lipids    Wears contact lenses    Past Surgical History:  Procedure Laterality Date   ABDOMINAL HYSTERECTOMY     CARPAL TUNNEL RELEASE Right 07/26/2018   Procedure: CARPAL TUNNEL RELEASE ENDOSCOPIC;  Surgeon: Wanda Flake, MD;  Location: [facility];  Service: Orthopedics;  Laterality: Right;  diabetic - oral meds   CHOLECYSTECTOMY     COLONOSCOPY WITH ESOPHAGOGASTRODUODENOSCOPY (EGD)     COLONOSCOPY WITH PROPOFOL N/A 01/13/2018   Procedure: COLONOSCOPY WITH PROPOFOL;  Surgeon: Wanda Jun, MD;  Location: [facility];  Service: Endoscopy;  Laterality: N/A;   HERNIA REPAIR     I & D EXTREMITY Right 04/01/2023   Procedure: IRRIGATION AND DEBRIDEMENT EXTREMITY PREVENA PLACEMENT;  Surgeon: Wanda Flake, MD;  Location: [facility];  Service: Orthopedics;  Laterality: Right;   INCISION AND DRAINAGE ABSCESS Right 03/30/2023   Procedure: INCISION AND DRAINAGE ABSCESS RIGHT HAND;  Surgeon: Wanda Flake, MD;  Location: [facility];  Service: Orthopedics;  Laterality: Right;    INCISION AND DRAINAGE OF WOUND Right 04/04/2023   Procedure: IRRIGATION AND DEBRIDEMENT RIGHT HAND DORSAL AND PALMAR INCISIONS WITH DELAYED PRIMARY CLOSURE;  Surgeon: Wanda Flake, MD;  Location: [facility];  Service: Orthopedics;  Laterality: Right;   left rotator cuff repair Left    SHOULDER ARTHROSCOPY WITH SUBACROMIAL DECOMPRESSION, ROTATOR CUFF REPAIR AND BICEP TENDON REPAIR Right 10/21/2021   Procedure: SHOULDER ARTHROSCOPY WITH DEBRIDEMENT, DECOMPRESSION, BICEPS TENODESIS.;  Surgeon: Wanda Flake, MD;  Location: [facility];  Service: Orthopedics;  Laterality: Right;   TUBAL LIGATION     Patient Active Problem List   Diagnosis Date Noted   MRSA infection 04/04/2023   Abscess of right hand 03/31/2023   Cellulitis of right hand 03/30/2023   Depression with anxiety 03/30/2023   HLD (hyperlipidemia) 03/30/2023   Type II diabetes mellitus with renal manifestations (HCC) 03/30/2023   Chronic kidney disease, stage 3a (HCC) 03/30/2023   Abnormal LFTs 03/30/2023   Severe sepsis (HCC) 03/30/2023   Hypothyroidism    Hypertension    COPD (chronic obstructive pulmonary disease) (HCC)     ONSET DATE: 03/30/23  REFERRING DIAG:Abscess of R hand -3 irrigation surgeries  THERAPY DIAG:  Scar condition and fibrosis of skin  Stiffness of right hand, not elsewhere classified  Muscle weakness (generalized)  Stiffness of  right wrist, not elsewhere classified  Localized edema  Rationale for Evaluation and Treatment: Rehabilitation  SUBJECTIVE:   SUBJECTIVE STATEMENT: I tried to curl around the steering wheel - and bending those fingers - stiffness more than pain - knuckles little sore   pt accompanied by: self  PERTINENT HISTORY:  Ortho note 04/20/23 Wanda Smith is a 62 y.o. female who presents today for a skin check status post multiple irrigation and debridements involving right dorsal and volar hand wounds and possible suture removal. The patient was admitted to the hospital after  suffering a dog scratch which eventually became more swollen and painful. The patient underwent a CT scan of the right hand which demonstrated a abscess formation along the volar aspect of the hand initially. She underwent 1 irrigation debridement however the patient continued to have swelling and pain prompting a repeat CT scan which demonstrated abscess formation along the dorsal aspect of the hand. The patient underwent a second irrigation debridement and a final third procedure performed by Dr. Joice Smith on 04/04/2023. The patient was evaluated last week where skin inspection demonstrated excellent granulation without any significant erythema or purulent drainage. The patient then had a follow-up with infectious disease where repeat CRP came back at a normal level. The patient does still have a oral prescription for linezolid which she is scheduled to take until end this week. She denies any trauma or injury affecting the right hand since her last evaluation. She does report an occasional burning discomfort in the index finger. She is still taking occasional oxycodone. She reports a 0 out of 10 pain score at today's visit. Stitches remove- and to finish antibiotics and refer to OT   PRECAUTIONS: None      WEIGHT BEARING RESTRICTIONS: No  PAIN:  Are you having pain? tightness more than pain   FALLS: Has patient fallen in last 6 months? No  LIVING ENVIRONMENT: Lives with: lives with their family and lives with their spouse   PLOF: Work on [employer], gym 3 x wk , do own cooking and housework  PATIENT GOALS: Get my motion and strength back in the right hand so that I can carry and grip and hold objects  NEXT MD VISIT: Middle September OBJECTIVE:   HAND DOMINANCE: Right   UPPER EXTREMITY ROM:     Active ROM Right eval Left eval R  04/29/23 05/03/23 05/16/23 R 9/16/24R  Shoulder flexion        Shoulder abduction        Shoulder adduction        Shoulder extension        Shoulder internal  rotation        Shoulder external rotation        Elbow flexion        Elbow extension        Wrist flexion 35 110 50 50 63 72  Wrist extension 48 85 60 55 65 70  Wrist ulnar deviation 24       Wrist radial deviation 20       Wrist pronation        Wrist supination        (Blank rows = not tested)  Active ROM Right eval Left eval R   04/29/23 R  05/12/23 R  05/16/23 R 05/23/23  Thumb MCP (0-60) 70       Thumb IP (0-80) 35       Thumb Radial abd/add (0-55) 45    50  50  Thumb Palmar abd/add (0-45) 45    50  65  Thumb Opposition to Small Finger Opposition to 3rd, 4th and 5th     Opposition to all digits    Index MCP (0-90) 30   50 60 60( 65  Index PIP (0-100) 45( ext -30   55 ( ext -20 60 ( ext -10) 60 ( blocked 70) 60  Index DIP (0-70)       50   Long MCP (0-90)  45   50 60 65 70  Long PIP (0-100)  90( ext -20   95 ( ext -15 95 ( ext -5)  100  Long DIP (0-70)          Ring MCP (0-90)  45   60 70 75 75  Ring PIP (0-100)  100 (ext -10   100( ext 0  100 - full ext  100  Ring DIP (0-70)          Little MCP (0-90)  75   75 80  80  Little PIP (0-100)  90 (ext 0   100 ( ext 0 100- full ext   100  Little DIP (0-70)          (Blank rows = not tested) In session  AROM increase to 75-90 at [facility]  and PIP's 100 3rd thru 5th - 2nd 70 degrees   05/23/23 GRIP R 14 lbs L 55 lbs, lat pinch R 4 lbs L 14 lbs and 3 point pinch R 5 lbs and L 10 lbs   05/30/23: Lat grip R 7 lbs , 14 L - 3 point R 6lbs , L 10 lbs    COORDINATION: Decreasing significantly because of stiffness and scar tissue in the second digit  EDEMA: R hand   COGNITION: Overall cognitive status: Within functional limits for tasks assessed    OBSERVATIONS:  Patient arrived with scar adhesion over the volar and dorsal hand limiting mostly second digit severely at [facility] and  PIP flexion as well as composite flexion TODAY'S TREATMENT:                                                                                                                               DATE:  05/30/23  Paraffin : Paraffin to wrist and hand for 8 min total, Coban flexion wrap  done to second and third digits  composite flexion during paraffin -to increase range of motion decrease scar tissue prior to range of motion Pt has since end of last week paraffin bath to use at home Manual:   manual therapy extensive focus on scar mobs with manual techniques using Coban on dorsal and volar hand and provided patient and educated on use.  Used mini massager  on volar second digit , web space as well as volar scar on hand.  Done extractor on the dorsal scar with active and passive range of motion of 2nd and 3rd  digits.  Metacarpal and carpal spreads as well as webspace massage performed by OT. Provided new  C[employer] -Care scar pad for dorsal and volar scars and silicone sleeve to wear on and off during the day    Therapeutic Exercises:  Cont teal med putty for rolling  volar palm and scar - CT spread and MC spreads -and rolling over palm for soft tissue mobs   Patient to continue with interlocking abduction adduction of digits 12 reps each Additional focus on opening second webspace of index and third digit  Composite passive range of motion for 3rd through 5th digits 8 reps. On the third on DIP and PIP 8 reps of passive range of motion prior to composite. Asked patient to do after use of flexion glove prior to composite fist.  Pt has flexion glove with rubber bands for second and third digit to volar wrist for 3 x 3 minutes prior to blocked active range of motion.- adjust increase pull on 3rd  Cont to use  DIP/PIP flexion strap to use for intrinsic of flexion at second digit to wear 2 x 2 minutes Assess progress and use with both Done and add to HEP for pt this date PROM composite fist rolling on thigh  or into light blue putty - 10 reps hold 5 sec - GREAT PROGRESS FOLLOWED BY GRIPPING INTO LIGHT BLUE PUTTY  OR  Patient to use DIP/PIP strap first 2 x 2 minutes prior  to flexion glove 2-3 times for 3 minutes Then light blue putty composite fist  into cylinder putty And upgrade last time to teal med putty for  lat pinch and 3 point pinch - 20 reps  After PROM and AROM  Review and needed min A for correct tech for 3 point pinch  Progress since last week   Cont at home  Cont buddy strap to prox and middle phalanges to use 2 hrs on and off during day 2nd to 3rd digit to facilitate flexion and increase use of 2nd digit   PROM  of thumb palmar /radial abduction 12 reps  AAROM Opposition to all digits 5 reps focusing on IP and MC flexion, pt reports she has been trying to focus on oval or "O" with opposition- increase ease  Cont AAROM for wrist flexion over armrest and prayer stretch 10 reps hold 5-10 sec  Progress well in flexion and ext of wrist the last week or 2 - done and review AAROM stretches   1 lbs weight - for RD, UD , sup /pron -and then flexion , ext of wrist   2 sets of 12 reps  2 x day    PATIENT EDUCATION: Education details: Findings of evaluation and home program Person educated: Patient Education method: Explanation, Demonstration, Tactile cues, Verbal cues, and Handouts Education comprehension: verbalized understanding, returned demonstration, verbal cues required, tactile cues required, and needs further education   GOALS: Goals reviewed with patient? Yes  SHORT TERM GOALS: Target date: 2 wks  Patient to be independent in home program to decrease scar tissue and edema - increased extension and flexion of digits as well as wrist Baseline: Severe stiffness in digits, increased scar tissue-scabs and dry skin removed this date-no knowledge of home program Goal status: INITIAL   LONG TERM GOALS: Target date: 8 wks Flexion of right digits improved for patient to touch palm to initiate use in ADLs with no increase symptoms Baseline: Dry skin and scabs removed today while -MC flexion 30 to 75 degrees, PIP 45 to  100 degrees-discomfort and  pain 5/10 Goal status: INITIAL   2.  Patient increase extension of right digits 2nd thru 4th to be able to don gloves, wash face and reach aroundcylinder objects without issues Baseline: Patient arrived with -10 to -30 PIP extension,  Goal status: INITIAL   3.  Scar tissue improved for patient to be able to make composite fist as well as increase extension and tolerate gripping objects Baseline: dry skin and scabs removed today; initiating scar massage but 3 scabs still in place,  unable to make composite fist as well as extension at PIPs -10 to -30 degrees Goal status: INITIAL   4.  Right grip and prehension strength increased to more than 75% compared to the left for patient to hold weight to work out and cook Baseline: scabs removed and dry skin- 3 scabs still in place; flexion extension still limited, scar massage initiated on some areas 3 weeks postop Goal status: INITIAL   5.  R wrist AROM increase to WNL to push and pull door open without increase symptoms  Baseline:  wrist flxion 35 , ext 48 and pain 5/10 Goal status: INITIAL  ASSESSMENT:  CLINICAL IMPRESSION: Patient presents at occupational therapy evaluation post multiple irrigation and debridements involving right dorsal and volar hand wounds -patient underwent a CT scan of the right hand which demonstrated a abscess formation along the volar aspect of the hand initially. She underwent 1 irrigation debridement however the patient continued to have swelling and pain prompting a repeat CT scan which demonstrated abscess formation along the dorsal aspect of the hand. The patient underwent a second irrigation debridement and a final third procedure performed by Dr. Joice Smith on 04/04/2023.  Patient presented for follow-up post evaluation with continued severe scar tissue and tightness in the volar and dorsal hand.  Limiting second and third digits mostly.  Patient making progress - ext of digits almost WNL -  as well as progressing well and  flexion at [facility] and PIP's.  Continue to have most stiffness in second digit MCP and PIP as well as composite flexion.  In session great progress in composite flexion at 3rd thru 5th -but also with light putty in 2nd digit.  Initiated last week light strengthening for wrist and grip and prehension.  Patient mostly limited by scar tissue causing severe tightness and stiffness mostly in the second and third metacarpal and digits more than pain reported.  Also increased edema over the area.  Patient can benefit from skilled OT services to decrease scar tissue and edema and increase range of motion in flexion and extension of digits as well as wrist, increase strength to return to prior level of function. PERFORMANCE DEFICITS: in functional skills including ADLs, IADLs, coordination, dexterity, edema, ROM, strength, pain, flexibility, and UE functional use,   and psychosocial skills including habits and routines and behaviors.   IMPAIRMENTS: are limiting patient from ADLs, IADLs, rest and sleep, work, play, leisure, and social participation.   COMORBIDITIES: has no other co-morbidities that affects occupational performance. Patient will benefit from skilled OT to address above impairments and improve overall function.  MODIFICATION OR ASSISTANCE TO COMPLETE EVALUATION: No modification of tasks or assist necessary to complete an evaluation.  OT OCCUPATIONAL PROFILE AND HISTORY: Problem focused assessment: Including review of records relating to presenting problem.  CLINICAL DECISION MAKING: LOW - limited treatment options, no task modification necessary  REHAB POTENTIAL: Good  EVALUATION COMPLEXITY: Low  PLAN:  OT FREQUENCY: 2x/week  OT DURATION: 8  weeks  PLANNED INTERVENTIONS: ADL's therapeutic exercise, therapeutic activity, manual therapy, scar mobilization, passive range of motion, splinting, paraffin, fluidotherapy, contrast bath, patient/family education, and DME and/or AE  instructions  CONSULTED AND AGREED WITH PLAN OF CARE: Patient   Oletta Cohn, OTR/L,CLT 05/30/2023, 10:29 AM

## 2023-06-02 ENCOUNTER — Ambulatory Visit: Payer: No Typology Code available for payment source | Admitting: Occupational Therapy

## 2023-06-02 DIAGNOSIS — R600 Localized edema: Secondary | ICD-10-CM

## 2023-06-02 DIAGNOSIS — M25631 Stiffness of right wrist, not elsewhere classified: Secondary | ICD-10-CM

## 2023-06-02 DIAGNOSIS — L905 Scar conditions and fibrosis of skin: Secondary | ICD-10-CM | POA: Diagnosis not present

## 2023-06-02 DIAGNOSIS — M6281 Muscle weakness (generalized): Secondary | ICD-10-CM

## 2023-06-02 DIAGNOSIS — M25641 Stiffness of right hand, not elsewhere classified: Secondary | ICD-10-CM

## 2023-06-02 NOTE — Therapy (Signed)
OUTPATIENT OCCUPATIONAL THERAPY ORTHO TREATMENT  Patient Name: Wanda Smith MRN: [id number] DOB:Dec 28, 1960, 62 y.o., female   PCP: Merlinda Frederick NP REFERRING PROVIDER: Marney Doctor PA  END OF SESSION:  OT End of Session - 06/02/23 0938     Visit Number 12    Number of Visits 16    Date for OT Re-Evaluation 06/20/23    OT Start Time 0817    OT Stop Time 0900    OT Time Calculation (min) 43 min    Activity Tolerance Patient tolerated treatment well    Behavior During Therapy WFL for tasks assessed/performed             Past Medical History:  Diagnosis Date   Anemia    Anxiety    Arthritis    knee (R)   Asthma    Chronic kidney disease    Stage 3 sees [place]   COPD (chronic obstructive pulmonary disease) (HCC)    Depression    Diabetes mellitus without complication (HCC)    Dyspnea    GERD (gastroesophageal reflux disease)    occ   Hypertension    Hypothyroidism    Increased serum lipids    Wears contact lenses    Past Surgical History:  Procedure Laterality Date   ABDOMINAL HYSTERECTOMY     CARPAL TUNNEL RELEASE Right 07/26/2018   Procedure: CARPAL TUNNEL RELEASE ENDOSCOPIC;  Surgeon: Christena Flake, MD;  Location: [facility];  Service: Orthopedics;  Laterality: Right;  diabetic - oral meds   CHOLECYSTECTOMY     COLONOSCOPY WITH ESOPHAGOGASTRODUODENOSCOPY (EGD)     COLONOSCOPY WITH PROPOFOL N/A 01/13/2018   Procedure: COLONOSCOPY WITH PROPOFOL;  Surgeon: Scot Jun, MD;  Location: [facility];  Service: Endoscopy;  Laterality: N/A;   HERNIA REPAIR     I & D EXTREMITY Right 04/01/2023   Procedure: IRRIGATION AND DEBRIDEMENT EXTREMITY PREVENA PLACEMENT;  Surgeon: Christena Flake, MD;  Location: [facility];  Service: Orthopedics;  Laterality: Right;   INCISION AND DRAINAGE ABSCESS Right 03/30/2023   Procedure: INCISION AND DRAINAGE ABSCESS RIGHT HAND;  Surgeon: Christena Flake, MD;  Location: [facility];  Service: Orthopedics;  Laterality: Right;    INCISION AND DRAINAGE OF WOUND Right 04/04/2023   Procedure: IRRIGATION AND DEBRIDEMENT RIGHT HAND DORSAL AND PALMAR INCISIONS WITH DELAYED PRIMARY CLOSURE;  Surgeon: Christena Flake, MD;  Location: [facility];  Service: Orthopedics;  Laterality: Right;   left rotator cuff repair Left    SHOULDER ARTHROSCOPY WITH SUBACROMIAL DECOMPRESSION, ROTATOR CUFF REPAIR AND BICEP TENDON REPAIR Right 10/21/2021   Procedure: SHOULDER ARTHROSCOPY WITH DEBRIDEMENT, DECOMPRESSION, BICEPS TENODESIS.;  Surgeon: Christena Flake, MD;  Location: [facility];  Service: Orthopedics;  Laterality: Right;   TUBAL LIGATION     Patient Active Problem List   Diagnosis Date Noted   MRSA infection 04/04/2023   Abscess of right hand 03/31/2023   Cellulitis of right hand 03/30/2023   Depression with anxiety 03/30/2023   HLD (hyperlipidemia) 03/30/2023   Type II diabetes mellitus with renal manifestations (HCC) 03/30/2023   Chronic kidney disease, stage 3a (HCC) 03/30/2023   Abnormal LFTs 03/30/2023   Severe sepsis (HCC) 03/30/2023   Hypothyroidism    Hypertension    COPD (chronic obstructive pulmonary disease) (HCC)     ONSET DATE: 03/30/23  REFERRING DIAG:Abscess of R hand -3 irrigation surgeries  THERAPY DIAG:  Scar condition and fibrosis of skin  Stiffness of right hand, not elsewhere classified  Muscle weakness (generalized)  Stiffness of  right wrist, not elsewhere classified  Localized edema  Rationale for Evaluation and Treatment: Rehabilitation  SUBJECTIVE:   SUBJECTIVE STATEMENT: Feels like very tight and stiff lately - doing everything you told me- paraffin, glove- rubber band ,putty- the Dr said they can maybe to surgery for the scar tissue- was not interested in it -but maybe now  I am   pt accompanied by: self  PERTINENT HISTORY:  Ortho note 04/20/23 Wanda Smith is a 62 y.o. female who presents today for a skin check status post multiple irrigation and debridements involving right dorsal and  volar hand wounds and possible suture removal. The patient was admitted to the hospital after suffering a dog scratch which eventually became more swollen and painful. The patient underwent a CT scan of the right hand which demonstrated a abscess formation along the volar aspect of the hand initially. She underwent 1 irrigation debridement however the patient continued to have swelling and pain prompting a repeat CT scan which demonstrated abscess formation along the dorsal aspect of the hand. The patient underwent a second irrigation debridement and a final third procedure performed by Dr. Joice Lofts on 04/04/2023. The patient was evaluated last week where skin inspection demonstrated excellent granulation without any significant erythema or purulent drainage. The patient then had a follow-up with infectious disease where repeat CRP came back at a normal level. The patient does still have a oral prescription for linezolid which she is scheduled to take until end this week. She denies any trauma or injury affecting the right hand since her last evaluation. She does report an occasional burning discomfort in the index finger. She is still taking occasional oxycodone. She reports a 0 out of 10 pain score at today's visit. Stitches remove- and to finish antibiotics and refer to OT   PRECAUTIONS: None      WEIGHT BEARING RESTRICTIONS: No  PAIN:  Are you having pain? tightness more than pain   FALLS: Has patient fallen in last 6 months? No  LIVING ENVIRONMENT: Lives with: lives with their family and lives with their spouse   PLOF: Work on [employer], gym 3 x wk , do own cooking and housework  PATIENT GOALS: Get my motion and strength back in the right hand so that I can carry and grip and hold objects  NEXT MD VISIT: Middle September OBJECTIVE:   HAND DOMINANCE: Right   UPPER EXTREMITY ROM:     Active ROM Right eval Left eval R  04/29/23 05/03/23 05/16/23 R 9/16/24R 06/02/23 R  Shoulder flexion          Shoulder abduction         Shoulder adduction         Shoulder extension         Shoulder internal rotation         Shoulder external rotation         Elbow flexion         Elbow extension         Wrist flexion 35 110 50 50 63 72 80  Wrist extension 48 85 60 55 65 70 70  Wrist ulnar deviation 24      35  Wrist radial deviation 20      25  Wrist pronation         Wrist supination         (Blank rows = not tested)  Active ROM Right eval Left eval R   04/29/23 R  05/12/23 R  05/16/23  R 05/23/23 R 06/02/23  Thumb MCP (0-60) 70        Thumb IP (0-80) 35        Thumb Radial abd/add (0-55) 45    50  50 50  Thumb Palmar abd/add (0-45) 45    50  65 55  Thumb Opposition to Small Finger Opposition to 3rd, 4th and 5th     Opposition to all digits   Opposition to base of 5th   Index MCP (0-90) 30   50 60 60( 65 66  Index PIP (0-100) 45( ext -30   55 ( ext -20 60 ( ext -10) 60 ( blocked 70) 60 66  Index DIP (0-70)       50    Long MCP (0-90)  45   50 60 65 70 70  Long PIP (0-100)  90( ext -20   95 ( ext -15 95 ( ext -5)  100 100  Long DIP (0-70)           Ring MCP (0-90)  45   60 70 75 75 80  Ring PIP (0-100)  100 (ext -10   100( ext 0  100 - full ext  100 100  Ring DIP (0-70)           Little MCP (0-90)  75   75 80  80 85  Little PIP (0-100)  90 (ext 0   100 ( ext 0 100- full ext   100 100  Little DIP (0-70)           (Blank rows = not tested) In session  AROM increase to 75-90 at [facility]  and PIP's 100 3rd thru 5th - 2nd 70 degrees   05/23/23 GRIP R 14 lbs L 55 lbs, lat pinch R 4 lbs L 14 lbs and 3 point pinch R 5 lbs and L 10 lbs   05/30/23: Lat grip R 7 lbs , 14 L - 3 point R 6lbs , L 10 lbs   06/02/23 GRIP R 15 lbs L 55 lbs, lat pinch R 6 lbs L 14 lbs and 3 point pinch R 6 lbs and L 10 lbs  COORDINATION: Decreasing significantly because of stiffness and scar tissue in the second digit  EDEMA: R hand   COGNITION: Overall cognitive status: Within functional limits for tasks  assessed    OBSERVATIONS:  Patient arrived with scar adhesion over the volar and dorsal hand limiting mostly second digit severely at [facility] and  PIP flexion as well as composite flexion TODAY'S TREATMENT:                                                                                                                              DATE:  06/02/23  Paraffin : Paraffin to wrist and hand for 8 min total, Coban flexion wrap  done to second and third digits  composite flexion during paraffin -to increase range of motion decrease scar  tissue prior to range of motion Pt has paraffin bath to use at home Manual:   manual therapy extensive focus on scar mobs with manual techniques using Coban on dorsal and volar hand and provided patient and educated on use.  Used mini massager  on volar second digit , web space as well as volar scar on hand.  Done extractor on the dorsal scar with active and passive range of motion of 2nd and 3rd  digits.  Metacarpal and carpal spreads as well as webspace massage performed by OT. Provided new  [employer] -Care scar pad for dorsal and volar scars and silicone sleeve to wear on and off during the day    Therapeutic Exercises:  Cont teal med putty for rolling  volar palm and scar - CT spread and MC spreads -and rolling over palm for soft tissue mobs   Patient to continue with interlocking abduction adduction of digits 12 reps each Additional focus on opening second webspace of index and third digit  Composite passive range of motion for 3rd through 5th digits 8 reps. On the third on DIP and PIP 8 reps of passive range of motion prior to composite. Asked patient to do after use of flexion glove prior to composite fist.  Pt has flexion glove with rubber bands for second and third digit to volar wrist for 3 x 3 minutes prior to blocked active range of motion.- adjust increase pull on 3rd  Cont to use  DIP/PIP flexion strap to use for intrinsic of flexion at second digit to wear 2 x 2  minutes Assess progress and use with both Done and cont HEP  PROM composite fist rolling on thigh  or into light blue putty - 10 reps hold 5 sec - MC flexion increase to 75 after rolling composite fist into putty   OR  Patient to use DIP/PIP strap first 2 x 2 minutes prior to flexion glove 2-3 times for 3 minutes Then light blue putty composite fist  into cylinder putty Cont  teal med putty for  lat pinch and 3 point pinch - 20 reps  After PROM and AROM  Review and needed min A for correct tech for 3 point pinch  Progress since last week   Cont at home  Cont buddy strap to prox and middle phalanges to use 2 hrs on and off during day 2nd to 3rd digit to facilitate flexion and increase use of 2nd digit   PROM  of thumb palmar /radial abduction 12 reps  AAROM Opposition to all digits 5 reps focusing on IP and MC flexion, pt reports she has been trying to focus on oval or "O" with opposition- increase ease  Cont AAROM for wrist flexion over armrest and prayer stretch 10 reps hold 5-10 sec  Progress well in flexion and ext of wrist the last week or 2 - done and review AAROM stretches   1 lbs weight - for RD, UD , sup /pron -and then flexion , ext of wrist   2 sets of 12 reps  2 x day    PATIENT EDUCATION: Education details: Findings of evaluation and home program Person educated: Patient Education method: Explanation, Demonstration, Tactile cues, Verbal cues, and Handouts Education comprehension: verbalized understanding, returned demonstration, verbal cues required, tactile cues required, and needs further education   GOALS: Goals reviewed with patient? Yes  SHORT TERM GOALS: Target date: 2 wks  Patient to be independent in home program to decrease scar tissue and edema -  increased extension and flexion of digits as well as wrist Baseline: Severe stiffness in digits, increased scar tissue-scabs and dry skin removed this date-no knowledge of home program Goal status: INITIAL   LONG  TERM GOALS: Target date: 8 wks Flexion of right digits improved for patient to touch palm to initiate use in ADLs with no increase symptoms Baseline: Dry skin and scabs removed today while -MC flexion 30 to 75 degrees, PIP 45 to 100 degrees-discomfort and pain 5/10 Goal status: INITIAL   2.  Patient increase extension of right digits 2nd thru 4th to be able to don gloves, wash face and reach aroundcylinder objects without issues Baseline: Patient arrived with -10 to -30 PIP extension,  Goal status: INITIAL   3.  Scar tissue improved for patient to be able to make composite fist as well as increase extension and tolerate gripping objects Baseline: dry skin and scabs removed today; initiating scar massage but 3 scabs still in place,  unable to make composite fist as well as extension at PIPs -10 to -30 degrees Goal status: INITIAL   4.  Right grip and prehension strength increased to more than 75% compared to the left for patient to hold weight to work out and cook Baseline: scabs removed and dry skin- 3 scabs still in place; flexion extension still limited, scar massage initiated on some areas 3 weeks postop Goal status: INITIAL   5.  R wrist AROM increase to WNL to push and pull door open without increase symptoms  Baseline:  wrist flxion 35 , ext 48 and pain 5/10 Goal status: INITIAL  ASSESSMENT:  CLINICAL IMPRESSION: Patient presents at occupational therapy evaluation post multiple irrigation and debridements involving right dorsal and volar hand wounds -patient underwent a CT scan of the right hand which demonstrated a abscess formation along the volar aspect of the hand initially. She underwent 1 irrigation debridement however the patient continued to have swelling and pain prompting a repeat CT scan which demonstrated abscess formation along the dorsal aspect of the hand. The patient underwent a second irrigation debridement and a final third procedure performed by Dr. Joice Lofts on  04/04/2023.  Patient presented for follow-up post evaluation with continued severe scar tissue and tightness in the volar and dorsal hand.  Limiting second and third digits mostly.  Patient  made progress - but last 2-3 wks 2nd digit MC and PIP flexion , as well as 3rd MC about same - improve in session but cannot maintain - last week volar scar more than dorsal scar very tight - Extention of digits almost WNL .   Recommend for pt to contact surgeon - pt next appt not until  11th of Oct -and pt mostly limited by scar tissue causing severe tightness and stiffness mostly in the second and third metacarpal and  and 2nd PIP - with increase edema and discomfort with PROM and stretches.   Patient can benefit from skilled OT services to decrease scar tissue and edema and increase range of motion in flexion and extension of digits as well as wrist, increase strength to return to prior level of function. PERFORMANCE DEFICITS: in functional skills including ADLs, IADLs, coordination, dexterity, edema, ROM, strength, pain, flexibility, and UE functional use,   and psychosocial skills including habits and routines and behaviors.   IMPAIRMENTS: are limiting patient from ADLs, IADLs, rest and sleep, work, play, leisure, and social participation.   COMORBIDITIES: has no other co-morbidities that affects occupational performance. Patient will benefit from skilled OT  to address above impairments and improve overall function.  MODIFICATION OR ASSISTANCE TO COMPLETE EVALUATION: No modification of tasks or assist necessary to complete an evaluation.  OT OCCUPATIONAL PROFILE AND HISTORY: Problem focused assessment: Including review of records relating to presenting problem.  CLINICAL DECISION MAKING: LOW - limited treatment options, no task modification necessary  REHAB POTENTIAL: Good  EVALUATION COMPLEXITY: Low  PLAN:  OT FREQUENCY: 2x/week  OT DURATION: 8 weeks  PLANNED INTERVENTIONS: ADL's therapeutic  exercise, therapeutic activity, manual therapy, scar mobilization, passive range of motion, splinting, paraffin, fluidotherapy, contrast bath, patient/family education, and DME and/or AE instructions  CONSULTED AND AGREED WITH PLAN OF CARE: Patient   Oletta Cohn, OTR/L,CLT 06/02/2023, 9:39 AM

## 2023-06-07 ENCOUNTER — Ambulatory Visit: Payer: No Typology Code available for payment source | Attending: Student | Admitting: Occupational Therapy

## 2023-06-07 DIAGNOSIS — R600 Localized edema: Secondary | ICD-10-CM | POA: Insufficient documentation

## 2023-06-07 DIAGNOSIS — M25631 Stiffness of right wrist, not elsewhere classified: Secondary | ICD-10-CM | POA: Diagnosis present

## 2023-06-07 DIAGNOSIS — M6281 Muscle weakness (generalized): Secondary | ICD-10-CM | POA: Diagnosis present

## 2023-06-07 DIAGNOSIS — L905 Scar conditions and fibrosis of skin: Secondary | ICD-10-CM | POA: Diagnosis present

## 2023-06-07 DIAGNOSIS — M25641 Stiffness of right hand, not elsewhere classified: Secondary | ICD-10-CM | POA: Insufficient documentation

## 2023-06-07 NOTE — Therapy (Signed)
OUTPATIENT OCCUPATIONAL THERAPY ORTHO TREATMENT  Patient Name: Wanda Smith MRN: [id number] DOB:08-21-1961, 62 y.o., female   PCP: Wanda Frederick NP REFERRING PROVIDER: Marney Doctor PA  END OF SESSION:  OT End of Session - 06/07/23 0949     Visit Number 13    Number of Visits 16    Date for OT Re-Evaluation 06/20/23    OT Start Time 0902    OT Stop Time 0941    OT Time Calculation (min) 39 min    Activity Tolerance Patient tolerated treatment well    Behavior During Therapy WFL for tasks assessed/performed             Past Medical History:  Diagnosis Date   Anemia    Anxiety    Arthritis    knee (R)   Asthma    Chronic kidney disease    Stage 3 sees Wanda Smith   COPD (chronic obstructive pulmonary disease) (HCC)    Depression    Diabetes mellitus without complication (HCC)    Dyspnea    GERD (gastroesophageal reflux disease)    occ   Hypertension    Hypothyroidism    Increased serum lipids    Wears contact lenses    Past Surgical History:  Procedure Laterality Date   ABDOMINAL HYSTERECTOMY     CARPAL TUNNEL RELEASE Right 07/26/2018   Procedure: CARPAL TUNNEL RELEASE ENDOSCOPIC;  Surgeon: Wanda Flake, MD;  Location: [facility];  Service: Orthopedics;  Laterality: Right;  diabetic - oral meds   CHOLECYSTECTOMY     COLONOSCOPY WITH ESOPHAGOGASTRODUODENOSCOPY (EGD)     COLONOSCOPY WITH PROPOFOL N/A 01/13/2018   Procedure: COLONOSCOPY WITH PROPOFOL;  Surgeon: Wanda Jun, MD;  Location: [facility];  Service: Endoscopy;  Laterality: N/A;   HERNIA REPAIR     I & D EXTREMITY Right 04/01/2023   Procedure: IRRIGATION AND DEBRIDEMENT EXTREMITY PREVENA PLACEMENT;  Surgeon: Wanda Flake, MD;  Location: [facility];  Service: Orthopedics;  Laterality: Right;   INCISION AND DRAINAGE ABSCESS Right 03/30/2023   Procedure: INCISION AND DRAINAGE ABSCESS RIGHT HAND;  Surgeon: Wanda Flake, MD;  Location: [facility];  Service: Orthopedics;  Laterality: Right;    INCISION AND DRAINAGE OF WOUND Right 04/04/2023   Procedure: IRRIGATION AND DEBRIDEMENT RIGHT HAND DORSAL AND PALMAR INCISIONS WITH DELAYED PRIMARY CLOSURE;  Surgeon: Wanda Flake, MD;  Location: [facility];  Service: Orthopedics;  Laterality: Right;   left rotator cuff repair Left    SHOULDER ARTHROSCOPY WITH SUBACROMIAL DECOMPRESSION, ROTATOR CUFF REPAIR AND BICEP TENDON REPAIR Right 10/21/2021   Procedure: SHOULDER ARTHROSCOPY WITH DEBRIDEMENT, DECOMPRESSION, BICEPS TENODESIS.;  Surgeon: Wanda Flake, MD;  Location: [facility];  Service: Orthopedics;  Laterality: Right;   TUBAL LIGATION     Patient Active Problem List   Diagnosis Date Noted   MRSA infection 04/04/2023   Abscess of right hand 03/31/2023   Cellulitis of right hand 03/30/2023   Depression with anxiety 03/30/2023   HLD (hyperlipidemia) 03/30/2023   Type II diabetes mellitus with renal manifestations (HCC) 03/30/2023   Chronic kidney disease, stage 3a (HCC) 03/30/2023   Abnormal LFTs 03/30/2023   Severe sepsis (HCC) 03/30/2023   Hypothyroidism    Hypertension    COPD (chronic obstructive pulmonary disease) (HCC)     ONSET DATE: 03/30/23  REFERRING DIAG:Abscess of R hand -3 irrigation surgeries  THERAPY DIAG:  Scar condition and fibrosis of skin  Stiffness of right hand, not elsewhere classified  Muscle weakness (generalized)  Stiffness of  right wrist, not elsewhere classified  Localized edema  Rationale for Evaluation and Treatment: Rehabilitation  SUBJECTIVE:   SUBJECTIVE STATEMENT: Got appt not until next Wed with Wanda Smith- my hand is stiff and sore - feel the scar in my palm the most    pt accompanied by: self  PERTINENT HISTORY:  Ortho note 04/20/23 Wanda Smith is a 62 y.o. female who presents today for a skin check status post multiple irrigation and debridements involving right dorsal and volar hand wounds and possible suture removal. The patient was admitted to the hospital after suffering a dog  scratch which eventually became more swollen and painful. The patient underwent a CT scan of the right hand which demonstrated a abscess formation along the volar aspect of the hand initially. She underwent 1 irrigation debridement however the patient continued to have swelling and pain prompting a repeat CT scan which demonstrated abscess formation along the dorsal aspect of the hand. The patient underwent a second irrigation debridement and a final third procedure performed by Wanda. Joice Smith on 04/04/2023. The patient was evaluated last week where skin inspection demonstrated excellent granulation without any significant erythema or purulent drainage. The patient then had a follow-up with infectious disease where repeat CRP came back at a normal level. The patient does still have a oral prescription for linezolid which she is scheduled to take until end this week. She denies any trauma or injury affecting the right hand since her last evaluation. She does report an occasional burning discomfort in the index finger. She is still taking occasional oxycodone. She reports a 0 out of 10 pain score at today's visit. Stitches remove- and to finish antibiotics and refer to OT   PRECAUTIONS: None      WEIGHT BEARING RESTRICTIONS: No  PAIN:  Are you having pain? tightness more than pain   FALLS: Has patient fallen in last 6 months? No  LIVING ENVIRONMENT: Lives with: lives with their family and lives with their spouse   PLOF: Work on [employer], gym 3 x wk , do own cooking and housework  PATIENT GOALS: Get my motion and strength back in the right hand so that I can carry and grip and hold objects  NEXT MD VISIT: Middle September OBJECTIVE:   HAND DOMINANCE: Right   UPPER EXTREMITY ROM:     Active ROM Right eval Left eval R  04/29/23 05/03/23 05/16/23 R 9/16/24R 06/02/23 R  Shoulder flexion         Shoulder abduction         Shoulder adduction         Shoulder extension         Shoulder internal  rotation         Shoulder external rotation         Elbow flexion         Elbow extension         Wrist flexion 35 110 50 50 63 72 80  Wrist extension 48 85 60 55 65 70 70  Wrist ulnar deviation 24      35  Wrist radial deviation 20      25  Wrist pronation         Wrist supination         (Blank rows = not tested)  Active ROM Right eval Left eval R   04/29/23 R  05/12/23 R  05/16/23 R 05/23/23 R 06/02/23  Thumb MCP (0-60) 70  Thumb IP (0-80) 35        Thumb Radial abd/add (0-55) 45    50  50 50  Thumb Palmar abd/add (0-45) 45    50  65 55  Thumb Opposition to Small Finger Opposition to 3rd, 4th and 5th     Opposition to all digits   Opposition to base of 5th   Index MCP (0-90) 30   50 60 60( 65 66  Index PIP (0-100) 45( ext -30   55 ( ext -20 60 ( ext -10) 60 ( blocked 70) 60 66  Index DIP (0-70)       50    Long MCP (0-90)  45   50 60 65 70 70  Long PIP (0-100)  90( ext -20   95 ( ext -15 95 ( ext -5)  100 100  Long DIP (0-70)           Ring MCP (0-90)  45   60 70 75 75 80  Ring PIP (0-100)  100 (ext -10   100( ext 0  100 - full ext  100 100  Ring DIP (0-70)           Little MCP (0-90)  75   75 80  80 85  Little PIP (0-100)  90 (ext 0   100 ( ext 0 100- full ext   100 100  Little DIP (0-70)           (Blank rows = not tested) In session  AROM increase to 75-90 at [facility]  and PIP's 100 3rd thru 5th - 2nd 70 degrees   05/23/23 GRIP R 14 lbs L 55 lbs, lat pinch R 4 lbs L 14 lbs and 3 point pinch R 5 lbs and L 10 lbs   05/30/23: Lat grip R 7 lbs , 14 L - 3 point R 6lbs , L 10 lbs   06/07/23 GRIP R 15 lbs L 55 lbs, lat pinch R 7 lbs L 14 lbs and 3 point pinch R 8 lbs and L 10 lbs  COORDINATION: Decreasing significantly because of stiffness and scar tissue in the second digit  EDEMA: R hand   COGNITION: Overall cognitive status: Within functional limits for tasks assessed    OBSERVATIONS:  Patient arrived with scar adhesion over the volar and dorsal hand limiting  mostly second digit severely at [facility] and  PIP flexion as well as composite flexion TODAY'S TREATMENT:                                                                                                                              DATE:  06/07/23  Paraffin : Paraffin to wrist and hand for 8 min total, Coban flexion wrap to 2nd DIP and PIP flexion -  during paraffin -to increase range of motion decrease scar tissue prior to range of motion Pt has paraffin bath to use at home Manual:   manual  therapy extensive focus on scar mobs with manual techniques using Coban on dorsal and volar hand and provided patient and educated on use.  Used mini massager over cica scar pad  on volar second digit , web space as well as volar scar on hand.  Done extractor on the dorsal scar with active and passive range of motion of 2nd and 3rd  digits/ wrist flexion.  Metacarpal and carpal spreads as well as webspace massage performed by OT. Provided new  Cica -Care scar pad for dorsal and volar scars and silicone sleeve to wear on and off during the day    Therapeutic Exercises:  Cont teal med putty for rolling  volar palm and scar - CT spread and MC spreads -and rolling over palm for soft tissue mobs   Patient to continue with interlocking abduction adduction of digits 12 reps each Additional focus on opening second webspace of index and third digit  Composite passive range of motion for 3rd through 5th digits 8 reps. On the third on DIP and PIP 8 reps of passive range of motion prior to composite. Asked patient to do after use of flexion glove prior to composite fist.  Pt has flexion glove with rubber bands for second and third digit to volar wrist for 3 x 3 minutes prior to blocked active range of motion.- adjust increase pull on 3rd  Cont to use  DIP/PIP flexion strap to use for intrinsic of flexion at second digit to wear 2 x 2 minutes Assess progress and use with both Done and cont HEP  PROM composite fist rolling on  thigh  or into light blue putty - 10 reps hold 5 sec - MC flexion increase to 75 after rolling composite fist into putty   OR  Patient to use DIP/PIP strap first 2 x 2 minutes prior to flexion glove 2-3 times for 3 minutes Then light blue putty composite fist  into cylinder putty Cont  teal med putty for  lat pinch and 3 point pinch - 20 reps  After PROM and AROM  Review and needed min A for correct tech for 3 point pinch  Progress since last week   Cont at home  Cont buddy strap to prox and middle phalanges to use 2 hrs on and off during day 2nd to 3rd digit to facilitate flexion and increase use of 2nd digit   PROM  of thumb palmar /radial abduction 12 reps  AAROM Opposition to all digits 5 reps focusing on IP and MC flexion, pt reports she has been trying to focus on oval or "O" with opposition- increase ease  Cont AAROM for wrist flexion over armrest and prayer stretch 10 reps hold 5-10 sec  Table slides on towel- 20 reps -composite extention       PATIENT EDUCATION: Education details: Findings of evaluation and home program Person educated: Patient Education method: Explanation, Demonstration, Tactile cues, Verbal cues, and Handouts Education comprehension: verbalized understanding, returned demonstration, verbal cues required, tactile cues required, and needs further education   GOALS: Goals reviewed with patient? Yes  SHORT TERM GOALS: Target date: 2 wks  Patient to be independent in home program to decrease scar tissue and edema - increased extension and flexion of digits as well as wrist Baseline: Severe stiffness in digits, increased scar tissue-scabs and dry skin removed this date-no knowledge of home program Goal status: INITIAL   LONG TERM GOALS: Target date: 8 wks Flexion of right digits improved for patient to touch  palm to initiate use in ADLs with no increase symptoms Baseline: Dry skin and scabs removed today while -MC flexion 30 to 75 degrees, PIP 45 to 100  degrees-discomfort and pain 5/10 Goal status: INITIAL   2.  Patient increase extension of right digits 2nd thru 4th to be able to don gloves, wash face and reach aroundcylinder objects without issues Baseline: Patient arrived with -10 to -30 PIP extension,  Goal status: INITIAL   3.  Scar tissue improved for patient to be able to make composite fist as well as increase extension and tolerate gripping objects Baseline: dry skin and scabs removed today; initiating scar massage but 3 scabs still in place,  unable to make composite fist as well as extension at PIPs -10 to -30 degrees Goal status: INITIAL   4.  Right grip and prehension strength increased to more than 75% compared to the left for patient to hold weight to work out and cook Baseline: scabs removed and dry skin- 3 scabs still in place; flexion extension still limited, scar massage initiated on some areas 3 weeks postop Goal status: INITIAL   5.  R wrist AROM increase to WNL to push and pull door open without increase symptoms  Baseline:  wrist flxion 35 , ext 48 and pain 5/10 Goal status: INITIAL  ASSESSMENT:  CLINICAL IMPRESSION: Patient presents at occupational therapy evaluation post multiple irrigation and debridements involving right dorsal and volar hand wounds -patient underwent a CT scan of the right hand which demonstrated a abscess formation along the volar aspect of the hand initially. She underwent 1 irrigation debridement however the patient continued to have swelling and pain prompting a repeat CT scan which demonstrated abscess formation along the dorsal aspect of the hand. The patient underwent a second irrigation debridement and a final third procedure performed by Wanda. Joice Smith on 04/04/2023.  Patient presented for follow-up post evaluation with continued severe scar tissue and tightness in the volar and dorsal hand.  Limiting second and third digits mostly.  Patient  made progress - but last 2-3 wks 2nd digit MC and PIP  flexion , as well as 3rd MC about same - improve in session but cannot maintain - last week volar scar more than dorsal scar very tight - Extention of digits almost WNL .    Pt did contact surgeon - appt next Wed. Pt mostly limited by scar tissue causing severe tightness and stiffness mostly in the second and third metacarpal and  and 2nd PIP - with increase edema and discomfort with PROM and stretches.   Patient can benefit from skilled OT services to decrease scar tissue and edema and increase range of motion in flexion and extension of digits as well as wrist, increase strength to return to prior level of function. PERFORMANCE DEFICITS: in functional skills including ADLs, IADLs, coordination, dexterity, edema, ROM, strength, pain, flexibility, and UE functional use,   and psychosocial skills including habits and routines and behaviors.   IMPAIRMENTS: are limiting patient from ADLs, IADLs, rest and sleep, work, play, leisure, and social participation.   COMORBIDITIES: has no other co-morbidities that affects occupational performance. Patient will benefit from skilled OT to address above impairments and improve overall function.  MODIFICATION OR ASSISTANCE TO COMPLETE EVALUATION: No modification of tasks or assist necessary to complete an evaluation.  OT OCCUPATIONAL PROFILE AND HISTORY: Problem focused assessment: Including review of records relating to presenting problem.  CLINICAL DECISION MAKING: LOW - limited treatment options, no task modification necessary  REHAB POTENTIAL: Good  EVALUATION COMPLEXITY: Low  PLAN:  OT FREQUENCY: 2x/week  OT DURATION: 8 weeks  PLANNED INTERVENTIONS: ADL's therapeutic exercise, therapeutic activity, manual therapy, scar mobilization, passive range of motion, splinting, paraffin, fluidotherapy, contrast bath, patient/family education, and DME and/or AE instructions  CONSULTED AND AGREED WITH PLAN OF CARE: Patient   Oletta Cohn,  OTR/L,CLT 06/07/2023, 9:52 AM

## 2023-06-09 ENCOUNTER — Ambulatory Visit: Payer: No Typology Code available for payment source | Admitting: Occupational Therapy

## 2023-06-09 DIAGNOSIS — L905 Scar conditions and fibrosis of skin: Secondary | ICD-10-CM

## 2023-06-09 DIAGNOSIS — M6281 Muscle weakness (generalized): Secondary | ICD-10-CM

## 2023-06-09 DIAGNOSIS — M25631 Stiffness of right wrist, not elsewhere classified: Secondary | ICD-10-CM

## 2023-06-09 DIAGNOSIS — M25641 Stiffness of right hand, not elsewhere classified: Secondary | ICD-10-CM

## 2023-06-09 DIAGNOSIS — R600 Localized edema: Secondary | ICD-10-CM

## 2023-06-09 NOTE — Therapy (Signed)
OUTPATIENT OCCUPATIONAL THERAPY ORTHO TREATMENT  Patient Name: Wanda Smith MRN: [id number] DOB:[birth year], 62 y.o., female   PCP: Merlinda Frederick NP REFERRING PROVIDER: Marney Doctor PA  END OF SESSION:  OT End of Session - 06/09/23 1015     Visit Number 14    Number of Visits 16    Date for OT Re-Evaluation 06/20/23    OT Start Time 0910    OT Stop Time 0954    OT Time Calculation (min) 44 min    Activity Tolerance Patient tolerated treatment well    Behavior During Therapy WFL for tasks assessed/performed             Past Medical History:  Diagnosis Date   Anemia    Anxiety    Arthritis    knee (R)   Asthma    Chronic kidney disease    Stage 3 sees Lateef   COPD (chronic obstructive pulmonary disease) (HCC)    Depression    Diabetes mellitus without complication (HCC)    Dyspnea    GERD (gastroesophageal reflux disease)    occ   Hypertension    Hypothyroidism    Increased serum lipids    Wears contact lenses    Past Surgical History:  Procedure Laterality Date   ABDOMINAL HYSTERECTOMY     CARPAL TUNNEL RELEASE Right 07/26/2018   Procedure: CARPAL TUNNEL RELEASE ENDOSCOPIC;  Surgeon: Christena Flake, MD;  Location: [facility];  Service: Orthopedics;  Laterality: Right;  diabetic - oral meds   CHOLECYSTECTOMY     COLONOSCOPY WITH ESOPHAGOGASTRODUODENOSCOPY (EGD)     COLONOSCOPY WITH PROPOFOL N/A 01/13/2018   Procedure: COLONOSCOPY WITH PROPOFOL;  Surgeon: Scot Jun, MD;  Location: [facility] ENDOSCOPY;  Service: Endoscopy;  Laterality: N/A;   HERNIA REPAIR     I & D EXTREMITY Right 04/01/2023   Procedure: IRRIGATION AND DEBRIDEMENT EXTREMITY PREVENA PLACEMENT;  Surgeon: Christena Flake, MD;  Location: [facility] ORS;  Service: Orthopedics;  Laterality: Right;   INCISION AND DRAINAGE ABSCESS Right 03/30/2023   Procedure: INCISION AND DRAINAGE ABSCESS RIGHT HAND;  Surgeon: Christena Flake, MD;  Location: [facility] OR[facility];  Service: Orthopedics;  Laterality: Right;    INCISION AND DRAINAGE OF WOUND Right 04/04/2023   Procedure: IRRIGATION AND DEBRIDEMENT RIGHT HAND DORSAL AND PALMAR INCISIONS WITH DELAYED PRIMARY CLOSURE;  Surgeon: Christena Flake, MD;  Location: [facility] OR[facility];  Service: Orthopedics;  Laterality: Right;   left rotator cuff repair Left    SHOULDER ARTHROSCOPY WITH SUBACROMIAL DECOMPRESSION, ROTATOR CUFF REPAIR AND BICEP TENDON REPAIR Right 10/21/2021   Procedure: SHOULDER ARTHROSCOPY WITH DEBRIDEMENT, DECOMPRESSION, BICEPS TENODESIS.;  Surgeon: Christena Flake, MD;  Location: [facility] ORS;  Service: Orthopedics;  Laterality: Right;   TUBAL LIGATION     Patient Active Problem List   Diagnosis Date Noted   MRSA infection 04/04/2023   Abscess of right hand 03/31/2023   Cellulitis of right hand 03/30/2023   Depression with anxiety 03/30/2023   HLD (hyperlipidemia) 03/30/2023   Type II diabetes mellitus with renal manifestations (HCC) 03/30/2023   Chronic kidney disease, stage 3a (HCC) 03/30/2023   Abnormal LFTs 03/30/2023   Severe sepsis (HCC) 03/30/2023   Hypothyroidism    Hypertension    COPD (chronic obstructive pulmonary disease) (HCC)     ONSET DATE: 03/30/23  REFERRING DIAG:Abscess of R hand -3 irrigation surgeries  THERAPY DIAG:  Scar condition and fibrosis of skin  Stiffness of right hand, not elsewhere classified  Muscle weakness (generalized)  Localized edema  Stiffness of right wrist, not elsewhere classified  Rationale for Evaluation and Treatment: Rehabilitation  SUBJECTIVE:   SUBJECTIVE STATEMENT: I put on the flexion glove this am and just wore it for 30 min while on computer -but the scar in my palm feels so tight   pt accompanied by: self  PERTINENT HISTORY:  Ortho note 04/20/23 Wanda Smith is a 62 y.o. female who presents today for a skin check status post multiple irrigation and debridements involving right dorsal and volar hand wounds and possible suture removal. The patient was admitted to the hospital after  suffering a dog scratch which eventually became more swollen and painful. The patient underwent a CT scan of the right hand which demonstrated a abscess formation along the volar aspect of the hand initially. She underwent 1 irrigation debridement however the patient continued to have swelling and pain prompting a repeat CT scan which demonstrated abscess formation along the dorsal aspect of the hand. The patient underwent a second irrigation debridement and a final third procedure performed by Dr. Joice Lofts on 04/04/2023. The patient was evaluated last week where skin inspection demonstrated excellent granulation without any significant erythema or purulent drainage. The patient then had a follow-up with infectious disease where repeat CRP came back at a normal level. The patient does still have a oral prescription for linezolid which she is scheduled to take until end this week. She denies any trauma or injury affecting the right hand since her last evaluation. She does report an occasional burning discomfort in the index finger. She is still taking occasional oxycodone. She reports a 0 out of 10 pain score at today's visit. Stitches remove- and to finish antibiotics and refer to OT   PRECAUTIONS: None      WEIGHT BEARING RESTRICTIONS: No  PAIN:  Are you having pain? tightness more than pain   FALLS: Has patient fallen in last 6 months? No  LIVING ENVIRONMENT: Lives with: lives with their family and lives with their spouse   PLOF: Work on [employer], gym 3 x wk , do own cooking and housework  PATIENT GOALS: Get my motion and strength back in the right hand so that I can carry and grip and hold objects  NEXT MD VISIT: Middle September OBJECTIVE:   HAND DOMINANCE: Right   UPPER EXTREMITY ROM:     Active ROM Right eval Left eval R  04/29/23 05/03/23 05/16/23 R 9/16/24R 06/02/23 R  Shoulder flexion         Shoulder abduction         Shoulder adduction         Shoulder extension          Shoulder internal rotation         Shoulder external rotation         Elbow flexion         Elbow extension         Wrist flexion 35 110 50 50 63 72 80  Wrist extension 48 85 60 55 65 70 70  Wrist ulnar deviation 24      35  Wrist radial deviation 20      25  Wrist pronation         Wrist supination         (Blank rows = not tested)  Active ROM Right eval Left eval R   04/29/23 R  05/12/23 R  05/16/23 R 05/23/23 R 06/02/23 R 06/09/23  Thumb MCP (0-60) 70  Thumb IP (0-80) 35         Thumb Radial abd/add (0-55) 45    50  50 50   Thumb Palmar abd/add (0-45) 45    50  65 55   Thumb Opposition to Small Finger Opposition to 3rd, 4th and 5th     Opposition to all digits   Opposition to base of 5th    Index MCP (0-90) 30   50 60 60( 65 66 70  Index PIP (0-100) 45( ext -30   55 ( ext -20 60 ( ext -10) 60 ( blocked 70) 60 66 70  Index DIP (0-70)       50     Long MCP (0-90)  45   50 60 65 70 70 75 ( 80 in session)  Long PIP (0-100)  90( ext -20   95 ( ext -15 95 ( ext -5)  100 100 100  Long DIP (0-70)            Ring MCP (0-90)  45   60 70 75 75 80 80( 90 in session  Ring PIP (0-100)  100 (ext -10   100( ext 0  100 - full ext  100 100 100  Ring DIP (0-70)            Little MCP (0-90)  75   75 80  80 85 85( 90 in session)  Little PIP (0-100)  90 (ext 0   100 ( ext 0 100- full ext   100 100 100  Little DIP (0-70)            (Blank rows = not tested) In session  AROM increase to 75-90 at [facility]  and PIP's 100 3rd thru 5th - 2nd 70 degrees   05/23/23 GRIP R 14 lbs L 55 lbs, lat pinch R 4 lbs L 14 lbs and 3 point pinch R 5 lbs and L 10 lbs   05/30/23: Lat grip R 7 lbs , 14 L - 3 point R 6lbs , L 10 lbs   06/07/23 GRIP R 15 lbs L 55 lbs, lat pinch R 7 lbs L 14 lbs and 3 point pinch R 8 lbs and L 10 lbs  COORDINATION: Decreasing significantly because of stiffness and scar tissue in the second digit  EDEMA: R hand   COGNITION: Overall cognitive status: Within functional limits for  tasks assessed    OBSERVATIONS:  Patient arrived with scar adhesion over the volar and dorsal hand limiting mostly second digit severely at [facility] and  PIP flexion as well as composite flexion TODAY'S TREATMENT:                                                                                                                              DATE:  06/07/23  Paraffin : Paraffin to wrist and hand for 8 min total,- digits in loose fist  -  during paraffin with heatingpad-to increase  range of motion decrease scar tissue prior to range of motion Pt has paraffin bath to use at home Manual:   manual therapy extensive focus on scar mobs with manual techniques using Coban on dorsal and volar hand. Used mini massager over cica scar pad  on volar second digit , web space as well as volar scar on hand.  Done extractor on the dorsal scar with active and passive range of motion of 2nd and 3rd  digits/ wrist flexion.  Metacarpal and carpal spreads as well as webspace massage performed by OT. Provided new  Cica -Care scar pad for volar scar and silicone sleeve to wear on and off during the day    Therapeutic Exercises:  Cont teal med putty for rolling  volar palm and scar - CT spread and MC spreads -and rolling over palm for soft tissue mobs   Patient to continue with interlocking abduction adduction of digits 12 reps each Additional focus on opening second webspace of index and third digit  Donn flexion glove with rubber bands for second and third digit to volar wrist for 2 x 3 min in session   Followed by rolling over thight into composite flexion  Increase flexion today with use of flexion glove this am at home and in session   Composite passive range of motion for 3rd through 5th digits 8 reps. On the third on DIP and PIP 8 reps of passive range of motion prior to composite.  Cont  HEP  using flexion glove 3 x day for 30 min f Followed by  PROM composite fist rolling on thigh   Followed by gripping into light  blue putty - cylinder- 10 reps hold 5 sec   Cont  teal med putty for  lat pinch and 3 point pinch - 20 reps  After PROM and AROM  Cont at home  Cont buddy strap to prox and middle phalanges to use 2 hrs on and off during day 2nd to 3rd digit to facilitate flexion and increase use of 2nd digit   PROM  of thumb palmar /radial abduction 12 reps  AAROM Opposition to all digits 5 reps focusing on IP and MC flexion, pt reports she has been trying to focus on oval or "O" with opposition- increase ease  Cont AAROM for wrist flexion over armrest and prayer stretch 10 reps hold 5-10 sec  Table slides on towel- 20 reps -composite extention       PATIENT EDUCATION: Education details: Findings of evaluation and home program Person educated: Patient Education method: Explanation, Demonstration, Tactile cues, Verbal cues, and Handouts Education comprehension: verbalized understanding, returned demonstration, verbal cues required, tactile cues required, and needs further education   GOALS: Goals reviewed with patient? Yes  SHORT TERM GOALS: Target date: 2 wks  Patient to be independent in home program to decrease scar tissue and edema - increased extension and flexion of digits as well as wrist Baseline: Severe stiffness in digits, increased scar tissue-scabs and dry skin removed this date-no knowledge of home program Goal status: INITIAL   LONG TERM GOALS: Target date: 8 wks Flexion of right digits improved for patient to touch palm to initiate use in ADLs with no increase symptoms Baseline: Dry skin and scabs removed today while -MC flexion 30 to 75 degrees, PIP 45 to 100 degrees-discomfort and pain 5/10 Goal status: INITIAL   2.  Patient increase extension of right digits 2nd thru 4th to be able to don gloves, wash face and reach  aroundcylinder objects without issues Baseline: Patient arrived with -10 to -30 PIP extension,  Goal status: INITIAL   3.  Scar tissue improved for patient to be  able to make composite fist as well as increase extension and tolerate gripping objects Baseline: dry skin and scabs removed today; initiating scar massage but 3 scabs still in place,  unable to make composite fist as well as extension at PIPs -10 to -30 degrees Goal status: INITIAL   4.  Right grip and prehension strength increased to more than 75% compared to the left for patient to hold weight to work out and cook Baseline: scabs removed and dry skin- 3 scabs still in place; flexion extension still limited, scar massage initiated on some areas 3 weeks postop Goal status: INITIAL   5.  R wrist AROM increase to WNL to push and pull door open without increase symptoms  Baseline:  wrist flxion 35 , ext 48 and pain 5/10 Goal status: INITIAL  ASSESSMENT:  CLINICAL IMPRESSION: Patient presents at occupational therapy evaluation post multiple irrigation and debridements involving right dorsal and volar hand wounds -patient underwent a CT scan of the right hand which demonstrated a abscess formation along the volar aspect of the hand initially. She underwent 1 irrigation debridement however the patient continued to have swelling and pain prompting a repeat CT scan which demonstrated abscess formation along the dorsal aspect of the hand. The patient underwent a second irrigation debridement and a final third procedure performed by Dr. Joice Lofts on 04/04/2023.  Patient presented for follow-up post evaluation with continued severe scar tissue and tightness in the volar and dorsal hand.  Limiting second and third digits mostly.  Patient  made progress - but last 2-3 wks 2nd digit MC and PIP flexion , as well as 3rd MC about same - This date pt arrive with wearing flexion glove for 30 min - and showed some increase AROM at 2nd [employer] and PIP and 3rd MC - done in clinic today followed by light blue putty gripping - pt to use flexion glove 3 x day for 30 min followed by putty gripping - and follow early next week to assess  if she can maintain progress with use of flexion glove -  Extention of digits almost WNL  - pt to cont with scar massage and extention stretches.    Pt has appt with surgeon next Wed. Pt mostly limited by scar tissue causing severe tightness and stiffness mostly in the second and third metacarpal and  and 2nd PIP - with increase edema and discomfort with PROM and stretches.   Patient can benefit from skilled OT services to decrease scar tissue and edema and increase range of motion in flexion and extension of digits as well as wrist, increase strength to return to prior level of function. PERFORMANCE DEFICITS: in functional skills including ADLs, IADLs, coordination, dexterity, edema, ROM, strength, pain, flexibility, and UE functional use,   and psychosocial skills including habits and routines and behaviors.   IMPAIRMENTS: are limiting patient from ADLs, IADLs, rest and sleep, work, play, leisure, and social participation.   COMORBIDITIES: has no other co-morbidities that affects occupational performance. Patient will benefit from skilled OT to address above impairments and improve overall function.  MODIFICATION OR ASSISTANCE TO COMPLETE EVALUATION: No modification of tasks or assist necessary to complete an evaluation.  OT OCCUPATIONAL PROFILE AND HISTORY: Problem focused assessment: Including review of records relating to presenting problem.  CLINICAL DECISION MAKING: LOW - limited treatment options,  no task modification necessary  REHAB POTENTIAL: Good  EVALUATION COMPLEXITY: Low  PLAN:  OT FREQUENCY: 2x/week  OT DURATION: 8 weeks  PLANNED INTERVENTIONS: ADL's therapeutic exercise, therapeutic activity, manual therapy, scar mobilization, passive range of motion, splinting, paraffin, fluidotherapy, contrast bath, patient/family education, and DME and/or AE instructions  CONSULTED AND AGREED WITH PLAN OF CARE: Patient   Oletta Cohn, OTR/L,CLT 06/09/2023, 10:16 AM

## 2023-06-13 ENCOUNTER — Ambulatory Visit: Payer: No Typology Code available for payment source | Admitting: Occupational Therapy

## 2023-06-14 ENCOUNTER — Ambulatory Visit: Payer: No Typology Code available for payment source | Admitting: Occupational Therapy

## 2023-06-28 ENCOUNTER — Other Ambulatory Visit: Payer: Self-pay | Admitting: Surgery

## 2023-06-29 ENCOUNTER — Encounter
Admission: RE | Admit: 2023-06-29 | Discharge: 2023-06-29 | Disposition: A | Discharge status: Home / Self Care | Payer: No Typology Code available for payment source | Source: Ambulatory Visit | Attending: Surgery | Admitting: Surgery

## 2023-06-29 ENCOUNTER — Other Ambulatory Visit: Payer: Self-pay

## 2023-06-29 VITALS — Ht 64.0 in | Wt 141.0 lb

## 2023-06-29 DIAGNOSIS — Z01812 Encounter for preprocedural laboratory examination: Secondary | ICD-10-CM

## 2023-06-29 HISTORY — DX: Type 2 diabetes mellitus with other diabetic kidney complication: E11.29

## 2023-06-29 HISTORY — DX: Chronic kidney disease, stage 3a: N18.31

## 2023-06-29 HISTORY — DX: Hyperlipidemia, unspecified: E78.5

## 2023-06-29 HISTORY — DX: Age-related osteoporosis without current pathological fracture: M81.0

## 2023-06-29 HISTORY — DX: Carpal tunnel syndrome, unspecified upper limb: G56.00

## 2023-06-29 NOTE — Patient Instructions (Addendum)
Your procedure is scheduled on: Tuesday, October 29 Report to the Registration Desk on the 1st floor of the [facility] To find out your arrival time, please call [phone] between 1PM - 3PM on: Monday, October 28 If your arrival time is 6:00 am, do not arrive before that time as the [facility] entrance doors do not open until 6:00 am.  REMEMBER: Instructions that are not followed completely may result in serious medical risk, up to and including death; or upon the discretion of your surgeon and anesthesiologist your surgery may need to be rescheduled.  Do not eat food after midnight the night before surgery.  No gum chewing or hard candies.  You may however, drink water up to 2 hours before you are scheduled to arrive for your surgery. Do not drink anything within 2 hours of your scheduled arrival time.  In addition, your doctor has ordered for you to drink the provided:  Gatorade G2 Drinking this carbohydrate drink up to two hours before surgery helps to reduce insulin resistance and improve patient outcomes. Please complete drinking 2 hours before scheduled arrival time.  One week prior to surgery: starting today, October 23 Stop Anti-inflammatories (NSAIDS) such as Advil, Aleve, Ibuprofen, Motrin, Naproxen, Naprosyn and Aspirin based products such as Excedrin, Goody's Powder, BC Powder. Stop ANY OVER THE COUNTER supplements until after surgery.  You may however, continue to take Tylenol if needed for pain up until the day of surgery.  Dapagliflozin Marcelline Deist) - stop 3 days before surgery. Last day to take is Friday, October 25. Resume AFTER surgery.  Metformin - stop 2 days before surgery. Last day to take is Saturday, October 26. Resume AFTER surgery.  Continue taking all of your other prescription medications up until the day of surgery.  ON THE DAY OF SURGERY ONLY TAKE THESE MEDICATIONS WITH SIPS OF WATER:  Citalopram (Celexa) Duloxetine (Cymbalta) Advair  inhaler Levothyroxine  Use inhalers on the day of surgery and bring your albuterol inhaler to the hospital.  No Alcohol for 24 hours before or after surgery.  No Smoking including e-cigarettes for 24 hours before surgery.  No chewable tobacco products for at least 6 hours before surgery.  No nicotine patches on the day of surgery.  Do not use any "recreational" drugs for at least a week (preferably 2 weeks) before your surgery.  Please be advised that the combination of cocaine and anesthesia may have negative outcomes, up to and including death. If you test positive for cocaine, your surgery will be cancelled.  On the morning of surgery brush your teeth with toothpaste and water, you may rinse your mouth with mouthwash if you wish. Do not swallow any toothpaste or mouthwash.  Use CHG Soap as directed on instruction sheet.  Do not wear jewelry, make-up, hairpins, clips or nail polish.  For welded (permanent) jewelry: bracelets, anklets, waist bands, etc.  Please have this removed prior to surgery.  If it is not removed, there is a chance that hospital personnel will need to cut it off on the day of surgery.  Do not wear lotions, powders, or perfumes.   Do not shave body hair from the neck down 48 hours before surgery.  Contact lenses, hearing aids and dentures may not be worn into surgery.  Do not bring valuables to the hospital. [facility] is not responsible for any missing/lost belongings or valuables.   Notify your doctor if there is any change in your medical condition (cold, fever, infection).  Wear  comfortable clothing (specific to your surgery type) to the hospital.  After surgery, you can help prevent lung complications by doing breathing exercises.  Take deep breaths and cough every 1-2 hours. Your doctor may order a device called an Incentive Spirometer to help you take deep breaths.  If you are being discharged the day of surgery, you will not be allowed to drive  home. You will need a responsible individual to drive you home and stay with you for 24 hours after surgery.   If you are taking public transportation, you will need to have a responsible individual with you.  Please call the Pre-admissions Testing Dept. at [phone] if you have any questions about these instructions.  Surgery Visitation Policy:  Patients having surgery or a procedure may have two visitors.  Children under the age of 82 must have an adult with them who is not the patient.       Preparing for Surgery with CHLORHEXIDINE GLUCONATE (CHG) Soap  Chlorhexidine Gluconate (CHG) Soap  o An antiseptic cleaner that kills germs and bonds with the skin to continue killing germs even after washing  o Used for showering the night before surgery and morning of surgery  Before surgery, you can play an important role by reducing the number of germs on your skin.  CHG (Chlorhexidine gluconate) soap is an antiseptic cleanser which kills germs and bonds with the skin to continue killing germs even after washing.  Please do not use if you have an allergy to CHG or antibacterial soaps. If your skin becomes reddened/irritated stop using the CHG.  1. Shower the NIGHT BEFORE SURGERY and the MORNING OF SURGERY with CHG soap.  2. If you choose to wash your hair, wash your hair first as usual with your normal shampoo.  3. After shampooing, rinse your hair and body thoroughly to remove the shampoo.  4. Use CHG as you would any other liquid soap. You can apply CHG directly to the skin and wash gently with a scrungie or a clean washcloth.  5. Apply the CHG soap to your body only from the neck down. Do not use on open wounds or open sores. Avoid contact with your eyes, ears, mouth, and genitals (private parts). Wash face and genitals (private parts) with your normal soap.  6. Wash thoroughly, paying special attention to the area where your surgery will be performed.  7. Thoroughly rinse  your body with warm water.  8. Do not shower/wash with your normal soap after using and rinsing off the CHG soap.  9. Pat yourself dry with a clean towel.  10. Wear clean pajamas to bed the night before surgery.  12. Place clean sheets on your bed the night of your first shower and do not sleep with pets.  13. Shower again with the CHG soap on the day of surgery prior to arriving at the hospital.  14. Do not apply any deodorants/lotions/powders.  15. Please wear clean clothes to the hospital.

## 2023-07-05 ENCOUNTER — Ambulatory Visit: Payer: No Typology Code available for payment source | Admitting: Anesthesiology

## 2023-07-05 ENCOUNTER — Encounter: Payer: Self-pay | Admitting: Surgery

## 2023-07-05 ENCOUNTER — Encounter
Admission: RE | Disposition: A | Discharge status: Home / Self Care | Payer: Self-pay | Source: Home / Self Care | Attending: Surgery

## 2023-07-05 ENCOUNTER — Other Ambulatory Visit: Payer: Self-pay

## 2023-07-05 ENCOUNTER — Ambulatory Visit
Admission: RE | Admit: 2023-07-05 | Discharge: 2023-07-05 | Disposition: A | Discharge status: Home / Self Care | Payer: No Typology Code available for payment source | Attending: Surgery | Admitting: Surgery

## 2023-07-05 DIAGNOSIS — Z7984 Long term (current) use of oral hypoglycemic drugs: Secondary | ICD-10-CM | POA: Insufficient documentation

## 2023-07-05 DIAGNOSIS — Z87891 Personal history of nicotine dependence: Secondary | ICD-10-CM | POA: Insufficient documentation

## 2023-07-05 DIAGNOSIS — E1122 Type 2 diabetes mellitus with diabetic chronic kidney disease: Secondary | ICD-10-CM | POA: Diagnosis not present

## 2023-07-05 DIAGNOSIS — K219 Gastro-esophageal reflux disease without esophagitis: Secondary | ICD-10-CM | POA: Insufficient documentation

## 2023-07-05 DIAGNOSIS — N189 Chronic kidney disease, unspecified: Secondary | ICD-10-CM | POA: Insufficient documentation

## 2023-07-05 DIAGNOSIS — J449 Chronic obstructive pulmonary disease, unspecified: Secondary | ICD-10-CM | POA: Diagnosis not present

## 2023-07-05 DIAGNOSIS — Z833 Family history of diabetes mellitus: Secondary | ICD-10-CM | POA: Insufficient documentation

## 2023-07-05 DIAGNOSIS — I129 Hypertensive chronic kidney disease with stage 1 through stage 4 chronic kidney disease, or unspecified chronic kidney disease: Secondary | ICD-10-CM | POA: Diagnosis not present

## 2023-07-05 DIAGNOSIS — M7501 Adhesive capsulitis of right shoulder: Secondary | ICD-10-CM | POA: Insufficient documentation

## 2023-07-05 DIAGNOSIS — Z9049 Acquired absence of other specified parts of digestive tract: Secondary | ICD-10-CM | POA: Insufficient documentation

## 2023-07-05 DIAGNOSIS — Z8249 Family history of ischemic heart disease and other diseases of the circulatory system: Secondary | ICD-10-CM | POA: Insufficient documentation

## 2023-07-05 DIAGNOSIS — L02511 Cutaneous abscess of right hand: Secondary | ICD-10-CM | POA: Insufficient documentation

## 2023-07-05 DIAGNOSIS — Z01812 Encounter for preprocedural laboratory examination: Secondary | ICD-10-CM

## 2023-07-05 HISTORY — PX: CLOSED REDUCTION METACARPAL WITH PERCUTANEOUS PINNING: SHX5613

## 2023-07-05 LAB — GLUCOSE, CAPILLARY
Glucose-Capillary: 94 mg/dL (ref 70–99)
Glucose-Capillary: 140 mg/dL — ABNORMAL HIGH (ref 70–99)

## 2023-07-05 SURGERY — CLOSED REDUCTION, FRACTURE, METACARPAL BONE, WITH PERCUTANEOUS PINNING
Anesthesia: General | Site: Finger | Laterality: Right

## 2023-07-05 MED ORDER — BUPIVACAINE HCL (PF) 0.5 % IJ SOLN
INTRAMUSCULAR | Status: AC
  Filled 2023-07-05: qty 30

## 2023-07-05 MED ORDER — ORAL CARE MOUTH RINSE: 15.0000 mL | Freq: Once | OROMUCOSAL | Status: AC

## 2023-07-05 MED ORDER — ONDANSETRON HCL 4 MG/2ML IJ SOLN
INTRAMUSCULAR | Status: DC | PRN
  Administered 2023-07-05: 4 mg via INTRAVENOUS

## 2023-07-05 MED ORDER — IPRATROPIUM-ALBUTEROL 0.5-2.5 (3) MG/3ML IN SOLN
RESPIRATORY_TRACT | Status: AC
  Filled 2023-07-05: qty 3

## 2023-07-05 MED ORDER — SODIUM CHLORIDE 0.9 % IV SOLN: INTRAVENOUS | Status: DC

## 2023-07-05 MED ORDER — GLYCOPYRROLATE 0.2 MG/ML IJ SOLN
INTRAMUSCULAR | Status: DC | PRN
  Administered 2023-07-05: .2 mg via INTRAVENOUS

## 2023-07-05 MED ORDER — OXYCODONE HCL 5 MG PO TABS: 5.0000 mg | ORAL_TABLET | ORAL | Status: DC | PRN

## 2023-07-05 MED ORDER — EPHEDRINE SULFATE-NACL 50-0.9 MG/10ML-% IV SOSY
PREFILLED_SYRINGE | INTRAVENOUS | Status: DC | PRN
  Administered 2023-07-05: 10 mg via INTRAVENOUS
  Administered 2023-07-05: 5 mg via INTRAVENOUS

## 2023-07-05 MED ORDER — HYDROMORPHONE HCL 1 MG/ML IJ SOLN
INTRAMUSCULAR | Status: AC
  Filled 2023-07-05: qty 1

## 2023-07-05 MED ORDER — FENTANYL CITRATE (PF) 100 MCG/2ML IJ SOLN
INTRAMUSCULAR | Status: AC
  Filled 2023-07-05: qty 2

## 2023-07-05 MED ORDER — EPHEDRINE 5 MG/ML INJ
INTRAVENOUS | Status: AC
  Filled 2023-07-05: qty 5

## 2023-07-05 MED ORDER — PHENYLEPHRINE 80 MCG/ML (10ML) SYRINGE FOR IV PUSH (FOR BLOOD PRESSURE SUPPORT)
PREFILLED_SYRINGE | INTRAVENOUS | Status: DC | PRN
  Administered 2023-07-05: 120 ug via INTRAVENOUS
  Administered 2023-07-05 (×2): 80 ug via INTRAVENOUS
  Administered 2023-07-05: 120 ug via INTRAVENOUS
  Administered 2023-07-05: 80 ug via INTRAVENOUS

## 2023-07-05 MED ORDER — KETOROLAC TROMETHAMINE 15 MG/ML IJ SOLN
15.0000 mg | Freq: Once | INTRAMUSCULAR | Status: AC
  Administered 2023-07-05: 15 mg via INTRAVENOUS

## 2023-07-05 MED ORDER — IPRATROPIUM-ALBUTEROL 0.5-2.5 (3) MG/3ML IN SOLN
3.0000 mL | RESPIRATORY_TRACT | Status: DC
  Administered 2023-07-05: 3 mL via RESPIRATORY_TRACT

## 2023-07-05 MED ORDER — FENTANYL CITRATE (PF) 100 MCG/2ML IJ SOLN: 25.0000 ug | INTRAMUSCULAR | Status: DC | PRN

## 2023-07-05 MED ORDER — MIDAZOLAM HCL 2 MG/2ML IJ SOLN
INTRAMUSCULAR | Status: DC | PRN
  Administered 2023-07-05: 2 mg via INTRAVENOUS

## 2023-07-05 MED ORDER — MIDAZOLAM HCL 2 MG/2ML IJ SOLN
INTRAMUSCULAR | Status: AC
  Filled 2023-07-05: qty 2

## 2023-07-05 MED ORDER — METOCLOPRAMIDE HCL 5 MG/ML IJ SOLN: 5.0000 mg | Freq: Three times a day (TID) | INTRAMUSCULAR | Status: DC | PRN

## 2023-07-05 MED ORDER — KETOROLAC TROMETHAMINE 15 MG/ML IJ SOLN
INTRAMUSCULAR | Status: AC
Start: 2023-07-05 — End: ?
  Filled 2023-07-05: qty 1

## 2023-07-05 MED ORDER — ACETAMINOPHEN 10 MG/ML IV SOLN
INTRAVENOUS | Status: AC
  Filled 2023-07-05: qty 100

## 2023-07-05 MED ORDER — BUPIVACAINE HCL (PF) 0.5 % IJ SOLN
INTRAMUSCULAR | Status: DC | PRN
  Administered 2023-07-05: 10 mL

## 2023-07-05 MED ORDER — FENTANYL CITRATE (PF) 100 MCG/2ML IJ SOLN
INTRAMUSCULAR | Status: DC | PRN
  Administered 2023-07-05 (×4): 25 ug via INTRAVENOUS

## 2023-07-05 MED ORDER — HYDROMORPHONE HCL 1 MG/ML IJ SOLN
INTRAMUSCULAR | Status: DC | PRN
  Administered 2023-07-05 (×2): .5 mg via INTRAVENOUS

## 2023-07-05 MED ORDER — ONDANSETRON HCL 4 MG/2ML IJ SOLN: 4.0000 mg | Freq: Four times a day (QID) | INTRAMUSCULAR | Status: DC | PRN

## 2023-07-05 MED ORDER — CEFAZOLIN SODIUM-DEXTROSE 2-4 GM/100ML-% IV SOLN
INTRAVENOUS | Status: AC
  Filled 2023-07-05: qty 100

## 2023-07-05 MED ORDER — ACETAMINOPHEN 325 MG PO TABS: 325.0000 mg | ORAL_TABLET | Freq: Four times a day (QID) | ORAL | Status: DC | PRN

## 2023-07-05 MED ORDER — PROPOFOL 10 MG/ML IV BOLUS
INTRAVENOUS | Status: DC | PRN
  Administered 2023-07-05: 180 mg via INTRAVENOUS
  Administered 2023-07-05: 20 mg via INTRAVENOUS

## 2023-07-05 MED ORDER — ONDANSETRON HCL 4 MG PO TABS: 4.0000 mg | ORAL_TABLET | Freq: Four times a day (QID) | ORAL | Status: DC | PRN

## 2023-07-05 MED ORDER — CHLORHEXIDINE GLUCONATE 0.12 % MT SOLN
OROMUCOSAL | Status: AC
  Filled 2023-07-05: qty 15

## 2023-07-05 MED ORDER — DEXMEDETOMIDINE HCL IN NACL 200 MCG/50ML IV SOLN
INTRAVENOUS | Status: DC | PRN
  Administered 2023-07-05: 4 ug via INTRAVENOUS
  Administered 2023-07-05: 8 ug via INTRAVENOUS
  Administered 2023-07-05: 4 ug via INTRAVENOUS

## 2023-07-05 MED ORDER — 0.9 % SODIUM CHLORIDE (POUR BTL) OPTIME
TOPICAL | Status: DC | PRN
  Administered 2023-07-05: 500 mL

## 2023-07-05 MED ORDER — METOCLOPRAMIDE HCL 10 MG PO TABS: 5.0000 mg | ORAL_TABLET | Freq: Three times a day (TID) | ORAL | Status: DC | PRN

## 2023-07-05 MED ORDER — LIDOCAINE HCL (CARDIAC) PF 100 MG/5ML IV SOSY
PREFILLED_SYRINGE | INTRAVENOUS | Status: DC | PRN
  Administered 2023-07-05: 80 mg via INTRAVENOUS

## 2023-07-05 MED ORDER — ACETAMINOPHEN 10 MG/ML IV SOLN
INTRAVENOUS | Status: DC | PRN
  Administered 2023-07-05: 1000 mg via INTRAVENOUS

## 2023-07-05 MED ORDER — PROPOFOL 10 MG/ML IV BOLUS
INTRAVENOUS | Status: AC
  Filled 2023-07-05: qty 20

## 2023-07-05 MED ORDER — DROPERIDOL 2.5 MG/ML IJ SOLN: 0.6250 mg | Freq: Once | INTRAMUSCULAR | Status: DC | PRN

## 2023-07-05 MED ORDER — DEXAMETHASONE SODIUM PHOSPHATE 10 MG/ML IJ SOLN
INTRAMUSCULAR | Status: DC | PRN
  Administered 2023-07-05: 6 mg via INTRAVENOUS

## 2023-07-05 MED ORDER — CHLORHEXIDINE GLUCONATE 0.12 % MT SOLN
15.0000 mL | Freq: Once | OROMUCOSAL | Status: AC
  Administered 2023-07-05: 15 mL via OROMUCOSAL

## 2023-07-05 MED ORDER — DEXMEDETOMIDINE HCL IN NACL 80 MCG/20ML IV SOLN
INTRAVENOUS | Status: AC
  Filled 2023-07-05: qty 20

## 2023-07-05 MED ORDER — CEFAZOLIN SODIUM-DEXTROSE 2-4 GM/100ML-% IV SOLN
2.0000 g | INTRAVENOUS | Status: AC
  Administered 2023-07-05: 2 g via INTRAVENOUS

## 2023-07-05 SURGICAL SUPPLY — 46 items
APL PRP STRL LF DISP 70% ISPRP (MISCELLANEOUS) ×1
BLADE DEBAKEY 8.0 (BLADE) ×1 IMPLANT
BLADE OSC/SAGITTAL 5.5X25 (BLADE) ×1 IMPLANT
BLADE SURG 15 STRL LF DISP TIS (BLADE) ×2 IMPLANT
BLADE SURG 15 STRL SS (BLADE) ×3
BNDG CMPR 5X3 KNIT ELC UNQ LF (GAUZE/BANDAGES/DRESSINGS) ×1
BNDG CMPR 5X4 CHSV STRCH STRL (GAUZE/BANDAGES/DRESSINGS)
BNDG COHESIVE 4X5 TAN STRL LF (GAUZE/BANDAGES/DRESSINGS) ×1 IMPLANT
BNDG ELASTIC 3INX 5YD STR LF (GAUZE/BANDAGES/DRESSINGS) ×1 IMPLANT
BNDG ESMARCH 4 X 12 STRL LF (GAUZE/BANDAGES/DRESSINGS) ×1
BNDG ESMARCH 4X12 STRL LF (GAUZE/BANDAGES/DRESSINGS) ×1 IMPLANT
BNDG GZE 12X3 1 PLY HI ABS (GAUZE/BANDAGES/DRESSINGS) ×1
BNDG STRETCH GAUZE 3IN X12FT (GAUZE/BANDAGES/DRESSINGS) ×1 IMPLANT
BUR 4X55 1 (BURR) ×1 IMPLANT
CHLORAPREP W/TINT 26 (MISCELLANEOUS) ×1 IMPLANT
CUFF TOURN SGL QUICK 18X4 (TOURNIQUET CUFF) ×1 IMPLANT
DRAPE FLUOR MINI C-ARM 54X84 (DRAPES) ×1 IMPLANT
FORCEPS JEWEL BIP 4-3/4 STR (INSTRUMENTS) ×1 IMPLANT
GAUZE XEROFORM 1X8 LF (GAUZE/BANDAGES/DRESSINGS) ×1 IMPLANT
GLOVE BIO SURGEON STRL SZ8 (GLOVE) ×2 IMPLANT
GLOVE INDICATOR 8.0 STRL GRN (GLOVE) ×1 IMPLANT
GOWN STRL REUS W/ TWL XL LVL3 (GOWN DISPOSABLE) ×1 IMPLANT
GOWN STRL REUS W/TWL XL LVL3 (GOWN DISPOSABLE) ×1
KIT TURNOVER KIT A (KITS) ×1 IMPLANT
MANIFOLD NEPTUNE II (INSTRUMENTS) ×1 IMPLANT
NS IRRIG 500ML POUR BTL (IV SOLUTION) ×1 IMPLANT
PACK EXTREMITY ARMC (MISCELLANEOUS) ×1 IMPLANT
PAD CAST 3X4 CTTN HI CHSV (CAST SUPPLIES) ×2 IMPLANT
PADDING CAST COTTON 3X4 STRL (CAST SUPPLIES) ×2
PASSER SUT SWANSON 36MM LOOP (INSTRUMENTS) IMPLANT
SPLINT CAST 1 STEP 3X12 (MISCELLANEOUS) ×1 IMPLANT
STOCKINETTE 48X4 2 PLY STRL (GAUZE/BANDAGES/DRESSINGS) ×1 IMPLANT
STOCKINETTE IMPERVIOUS 9X36 MD (GAUZE/BANDAGES/DRESSINGS) ×1 IMPLANT
STOCKINETTE STRL 4IN 9604848 (GAUZE/BANDAGES/DRESSINGS) ×1 IMPLANT
STRIP CLOSURE SKIN 1/2X4 (GAUZE/BANDAGES/DRESSINGS) ×1 IMPLANT
SUT ETHIBOND 0 MO6 C/R (SUTURE) ×1 IMPLANT
SUT MERSILENE 4 0 P 3 (SUTURE) IMPLANT
SUT PROLENE 4 0 PS 2 18 (SUTURE) ×1 IMPLANT
SUT VIC AB 2-0 CT2 27 (SUTURE) ×1 IMPLANT
SUT VIC AB 2-0 SH 27 (SUTURE) ×1
SUT VIC AB 2-0 SH 27XBRD (SUTURE) ×1 IMPLANT
SUT VIC AB 3-0 SH 27 (SUTURE) ×1
SUT VIC AB 3-0 SH 27X BRD (SUTURE) ×1 IMPLANT
TRAP FLUID SMOKE EVACUATOR (MISCELLANEOUS) ×1 IMPLANT
WATER STERILE IRR 500ML POUR (IV SOLUTION) ×1 IMPLANT
WIRE Z .062 C-WIRE SPADE TIP (WIRE) IMPLANT

## 2023-07-05 NOTE — Op Note (Signed)
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  Surgeon:   Maryagnes Amos, MD  Assistant:   None  Anesthesia:   General LMA  Findings:   As above.  Complications:   None  Fluids:   800 cc crystalloid  EBL:   2 cc  UOP:   None  TT:   57 minutes at 250 mmHg  Drains:   None  Closure:   4-0 Prolene interrupted sutures  Brief Clinical Note:   The patient is a 62 year old female who is now 3 months status post several irrigation debridements of dorsal and palmar right hand abscesses following a dog bite.  The infection has resolved, but she continues to have difficulty with flexion of her index and long MCP joints despite extensive occupational therapy and home exercises.  She presents at this time for an attempted closed manipulation of each joint with possible dorsal capsulectomies and possible releases of the radial and ulnar collateral ligaments of both the index and long MCP joints.  Procedure:   The patient was brought into the operating room and laid in the supine position.  After adequate general laryngeal mask anesthesia was obtained, the patient's right hand and upper extremity was prepped with ChloraPrep solution before being draped sterilely.  Preoperative antibiotics were administered.  A timeout was performed to verify the appropriate surgical site before an attempt was made to manipulate both the index and long MCP joints.  This was unsuccessful as neither the index or long MCP joints can be flex beyond 70 degrees.  Therefore, the limb was exsanguinated with an Esmarch and the tourniquet inflated to 250 mmHg in  preparation for open capsular releases.  The index MCP joint was approached first.  Utilizing the previous incision along the ulnar aspect of the MCP joint, an approximately 4 cm incision was made.  The incision was carried down through the subcutaneous tissues to expose the extensor mechanism.  Subcutaneous releases were performed to be sure that the overlying skin could slide smoothly on the extensor tendon.  The proximal two thirds of the ulnar sagittal band was released to provide access into the MCP joint.  Abundant synovial tissue was debrided sharply before the dorsal capsule was released.  An attempt was made to manipulate the MCP joint again, but this proved unsuccessful.  Therefore the ulnar and radial collateral ligaments were released from their metacarpal head attachments.  Subsequently, the MCP joint could be flexed to 90 degrees.  Next, the long CP joint was addressed.  An approximately 3 cm curvilinear incision was made over the dorsal ulnar aspect of the MCP joint.  The incision was carried down through the subcutaneous tissues to expose the extensor mechanism.  The ulnar sagittal band was identified and its proximal two thirds release to provide access into the MCP joint.  The dorsal capsule was released.  Again, an attempt was made to manipulate the MCP joint but the joint still could not be flex beyond 75 degrees.  Therefore, the radial and ulnar collateral ligaments were released from the metacarpal head attachments.  Subsequent, the MCP joint can be flexed to 90  degrees.  Both wounds were copiously irrigated with sterile saline solution using bulb irrigation before the ulnar sagittal bands of each wound were reapproximated using 4-0 Mersilene interrupted sutures.  The skin of each wound was then closed using 4-0 Prolene interrupted sutures.  A total of 10 cc of 0.5% plain Sensorcaine was injected in around the 2 incision sites to help with postoperative analgesia before a sterile bulky  dressing was applied to the hand.  The hand was then placed into a dorsal plaster splint maintaining the MCP joints of the index and long fingers and is close to 90 degrees of flexion as possible while maintaining the wrist in some extension.  The patient was then awakened, extubated, and returned to the recovery room in satisfactory condition after tolerating the procedure well.  Right access

## 2023-07-05 NOTE — H&P (Signed)
History of Present Illness:  Wanda Smith is a 62 y.o. female who presents for follow-up now 2.5 months status post several irrigation debridements of her right hand for dorsal and palmar abscesses. She continues to note pain, swelling, and stiffness in her hand, and rates her pain at 5/10 on today's visit. She has been taking Tylenol, applying Voltaren gel, and even taking an occasional Tylenol, all with limited benefit. She has been attending occupational therapy but continues to note difficulty regaining her index and long finger range of motion. This makes it difficult for her to perform many of her normal daily activities, such as picking up small objects or those activities which require more fine grasping motions. She also notes that the discomfort will occasionally awaken her from sleep. She denies any reinjury to the hand, and denies any fevers or chills. She also denies any numbness or paresthesias to her fingertips.  Current Outpatient Medications:  acetaminophen (TYLENOL) 500 MG tablet Take 1,500 mg by mouth as needed for Pain  albuterol 90 mcg/actuation inhaler Inhale 2 inhalations into the lungs every 4 (four) hours 3 each 3  blood-glucose meter,continuous (DEXCOM G7 RECEIVER) Misc Use 1 Device continuously 1 each 0  blood-glucose sensor Devi Use 1 Device continuously for 10 days 10 each 1  citalopram (CELEXA) 20 MG tablet Take 1 tablet (20 mg total) by mouth once daily 30 tablet 3  dapagliflozin propanediol (FARXIGA) 10 mg tablet Take by mouth  diclofenac (VOLTAREN) 1 % topical gel Apply 2 g topically 4 (four) times daily as needed (pain) 100 g 0  DULoxetine (CYMBALTA) 20 MG DR capsule Take 1 capsule (20 mg total) by mouth once daily 30 capsule 3  ferrous sulfate 325 (65 FE) MG tablet Take 325 mg by mouth daily with breakfast  fluticasone propion-salmeteroL (ADVAIR DISKUS) 250-50 mcg/dose diskus inhaler Inhale 1 inhalation into the lungs 2 (two) times daily 3 each 3  fluticasone  propionate (FLONASE) 50 mcg/actuation nasal spray Place 2 sprays into both nostrils once daily 16 g 2  levocetirizine (XYZAL) 5 MG tablet Take 1 tablet (5 mg total) by mouth every evening 90 tablet 1  levothyroxine (SYNTHROID) 88 MCG tablet Take 1 tablet (88 mcg total) by mouth once daily Take on an empty stomach with a glass of water at least 30-60 minutes before breakfast. 90 tablet 1  lisinopriL (ZESTRIL) 40 MG tablet Take 1 tablet (40 mg total) by mouth once daily 90 tablet 2  metFORMIN (GLUCOPHAGE) 1000 MG tablet Take 1 tablet (1,000 mg total) by mouth 2 (two) times daily 180 tablet 1  montelukast (SINGULAIR) 10 mg tablet Take 1 tablet (10 mg total) by mouth once daily 60 tablet 5  oxyCODONE (ROXICODONE) 5 MG immediate release tablet Take 1 tablet (5 mg total) by mouth every 8 (eight) hours as needed for Pain 21 tablet 0  rosuvastatin (CRESTOR) 10 MG tablet Take 1 tablet (10 mg total) by mouth once daily 90 tablet 2   Allergies:  Pravastatin Other (Abnormal Liver Function Studies)  Ultram [Tramadol] Other (GI Upset)   Past Medical History:  Abdominal hernia 62 Years old  Allergic state  Anxiety  Arthritis  Asthma without status asthmaticus (HHS-HCC)  Carpal tunnel syndrome, bilateral  Chronic airway obstruction (CMS/HHS-HCC) - not elsewhere classified  Chronic kidney disease  Complete rupture of rotator cuff  COPD (chronic obstructive pulmonary disease) (CMS/HHS-HCC)  Depression  Sometimes  Disorders of bursae and tendons in shoulder region, unspecified  Essential hypertension, benign  Fatty liver  GERD (gastroesophageal reflux disease)  Graves disease 2000  s/p I-131 ablation  Osteoporosis, post-menopausal (Maybe?)  Other and unspecified hyperlipidemia  Type II or unspecified type diabetes mellitus without mention of complication, not stated as uncontrolled (CMS/HHS-HCC)  Unspecified hypothyroidism   Past Surgical History:  TUBAL LIGATION 1993  HYSTERECTOMY 1998 (partial)   Left rotator cuff repair 10/2009  CHOLECYSTECTOMY 11/2011 (for cholelithiasis)  EGD 03/03/2012 (No repeat per RTE)  COLONOSCOPY 03/03/2012 (Adenomatous Polyps: CBF 02/2017)  COLONOSCOPY 01/13/2018 [facility] (Father) Hyperplastic Polyp: CBF 01/2023)  ENDOSCOPIC CARPAL TUNNEL RELEASE Right 07/26/2018  Extensive arthroscopic debridement, arthroscopic subacromial decompression, and mini-open biceps tenodesis, right shoulder Right 10/21/2021 (Dr. Joice Lofts)  Colon @ [facility] 02/23/2022 (Normal examined colon/Repeat 48yrs/TKT)  EGD @ [facility] 02/23/2022 (Reflux esophagitis/Benign appearing esophageal stenosis/No repeat/TKT)  Irrigation and debridement of right hand abscess with I&D of right index flexor sheath Right 03/30/2023 (Dr. Joice Lofts)  HERNIA REPAIR as a child   Family History:  Breast cancer Mother Corlis Hove  Asthma Mother Corlis Hove  Hyperlipidemia (Elevated cholesterol) Mother Corlis Hove  High blood pressure (Hypertension) Mother Corlis Hove  Kidney disease Mother Corlis Hove  Dementia Mother Corlis Hove  Arthritis Mother Corlis Hove  Diabetes type II Father Lionel December  Deceased  Hyperlipidemia (Elevated cholesterol) Father Lionel December  Deceased  Pancreatic cancer Father Lionel December  Coronary Artery Disease (Blocked arteries around heart) Father Lionel December  Deceased  Cancer Father Lionel December  pancreatic  Colon polyps Father Lionel December  Heart disease Father Lionel December  Gout Father Lionel December  Diabetes type II Sister  Diabetes type II Brother  Thyroid disease Brother  Breast cancer Sister  Breast cancer Sister Audrie Gallus  Diabetes type II Sister Raynette Mariano  Diabetes type II Brother Carma Lair  Thyroid disease Brother Carma Lair  Colon cancer Neg Hx   Social History:   Socioeconomic History:  Marital status: Married  Tobacco Use  Smoking status: Former  Current packs/day: 0.00  Types: Cigarettes  Quit date:  2002  Years since quitting: 22.7  Smokeless tobacco: Never  Tobacco comments:  On and off for years quit 2002  Vaping Use  Vaping status: Never Used  Substance and Sexual Activity  Alcohol use: Yes  Alcohol/week: 0.0 standard drinks of alcohol  Types: 2 Glasses of wine, 2 Standard drinks or equivalent per week  Comment: Occassionally  Drug use: No  Sexual activity: Yes  Partners: Male   Social Determinants of Health:   Financial Resource Strain: Low Risk (06/14/2023)  Overall Financial Resource Strain (CARDIA)  Difficulty of Paying Living Expenses: Not hard at all  Food Insecurity: No Food Insecurity (06/14/2023)  Hunger Vital Sign  Worried About Running Out of Food in the Last Year: Never true  Ran Out of Food in the Last Year: Never true  Transportation Needs: No Transportation Needs (06/14/2023)  PRAPARE - Risk analyst (Medical): No  Lack of Transportation (Non-Medical): No   Review of Systems:  A comprehensive 14 point ROS was performed, reviewed, and the pertinent orthopaedic findings are documented in the HPI.  Physical Exam: Vitals:  06/15/23 1052  BP: 112/75  Weight: 63.9 kg (140 lb 12.8 oz)  Height: 162.6 cm (5\' 4" )  PainSc: 5  PainLoc: Hand   General/Constitutional: The patient appears to be well-nourished, well-developed, and in no acute distress. Neuro/Psych: Normal mood and affect, oriented to person, place and time.  Right hand exam: Skin inspection of the right hand demonstrates her surgical incisions to be  well-healed and without evidence for infection. There is mild residual swelling around the hand, especially over the index and long MCP joint regions.. She has mild tenderness to palpation over the dorsal and palmar aspects of the index more so than long MCP joints. She is able after he flex extend all digits, but notes moderate limitation in flexion of the index and long fingers. Specifically, her index MCP joint can be flexed to  45 degrees, the PIP joint can be flexed to 70 degrees, and the DIP joint can be flexed to 20 degrees. Regarding her long finger, the MCP joint can be flexed to 70 degrees, the PIP joint can be flexed to 90 degrees, and the DIP joint can be flexed to 40 degrees. She is grossly neurovascularly intact to all digits.  Assessment: 1. Adhesive capsulitis of finger. 2. Abscess of multiple sites of right hand and fingers.  3. Type 2 diabetes mellitus without complication, unspecified whether long term insulin use (CMS/HHS-HCC).   Plan: The treatment options were discussed with the patient. In addition, patient educational materials were provided regarding the diagnosis and treatment options. The patient is quite frustrated by her residual symptoms and functional limitations, and ask if there is anything further that can be done. Therefore, I have recommended that we proceed with surgical intervention to include a manipulation under anesthesia of her index and long fingers with possible capsular releases of the index and long MCP joints. The procedure was discussed with the patient, as were the potential risks (including bleeding, infection, nerve and/or blood vessel injury, persistent or recurrent pain/stiffness, need for further surgery, blood clots, strokes, heart attacks and/or arhythmias, pneumonia, etc.) and benefits. The patient states her understanding and wishes to proceed. All of the patient's questions and concerns were answered. She can call any time with further concerns. She will follow up post-surgery, routine.    H&P reviewed and patient re-examined. No changes.

## 2023-07-05 NOTE — Anesthesia Postprocedure Evaluation (Signed)
Anesthesia Post Note  Patient: Wanda Smith  Procedure(s) Performed: MANIPULATION UNDER ANESTHESIA OF RIGHT INDEX AND LONG FINGERS WITH POSSIBLE CAPSULAR OF RIGHT INDEX AND LONG MCP JOINTS (Right: Finger)  Patient location during evaluation: PACU Anesthesia Type: General Level of consciousness: awake and alert Pain management: pain level controlled Vital Signs Assessment: post-procedure vital signs reviewed and stable Respiratory status: spontaneous breathing, nonlabored ventilation, respiratory function stable and patient connected to nasal cannula oxygen Cardiovascular status: blood pressure returned to baseline and stable Postop Assessment: no apparent nausea or vomiting Anesthetic complications: no   No notable events documented.   Last Vitals:  Vitals:   07/05/23 1745 07/05/23 1800  BP: 115/72 109/77  Pulse: 83 82  Resp: (!) 8 12  Temp:  36.7 C  SpO2: 92% 95%    Last Pain:  Vitals:   07/05/23 1745  TempSrc:   PainSc: 0-No pain                 Yevette Edwards

## 2023-07-05 NOTE — Anesthesia Preprocedure Evaluation (Signed)
Anesthesia Evaluation  Patient identified by MRN, date of birth, ID band Patient awake    Reviewed: Allergy & Precautions, NPO status , Patient's Chart, lab work & pertinent test results  History of Anesthesia Complications Negative for: history of anesthetic complications  Airway Mallampati: III  TM Distance: >3 FB Neck ROM: Full    Dental  (+) Dental Advidsory Given, Teeth Intact, Partial Upper Upper partial; lower molars missing:   Pulmonary neg shortness of breath, asthma , neg sleep apnea, COPD,  COPD inhaler, neg recent URI, Patient abstained from smoking.Not current smoker, former smoker   Pulmonary exam normal breath sounds clear to auscultation       Cardiovascular Exercise Tolerance: Good METShypertension, (-) angina (-) CAD, (-) Past MI and (-) Cardiac Stents Normal cardiovascular exam(-) dysrhythmias (-) Valvular Problems/Murmurs Rhythm:Regular Rate:Normal - Systolic murmurs ECG 10/15/21: normal   Neuro/Psych  PSYCHIATRIC DISORDERS Anxiety Depression    negative neurological ROS     GI/Hepatic ,GERD  Controlled,,(+)     (-) substance abuse    Endo/Other  diabetes, Type 2Hypothyroidism    Renal/GU CRFRenal disease (stage III CKD)     Musculoskeletal  (+) Arthritis ,    Abdominal   Peds  Hematology negative hematology ROS (+)   Anesthesia Other Findings Past Medical History: No date: Anemia No date: Anxiety No date: Arthritis     Comment:  knee (R) No date: Asthma No date: Chronic kidney disease     Comment:  Stage 3 sees Lateef No date: COPD (chronic obstructive pulmonary disease) (HCC) No date: Depression No date: Diabetes mellitus without complication (HCC) No date: Dyspnea No date: GERD (gastroesophageal reflux disease)     Comment:  occ No date: Hypertension No date: Hypothyroidism No date: Increased serum lipids No date: Wears contact lenses   Reproductive/Obstetrics                              Anesthesia Physical Anesthesia Plan  ASA: 3  Anesthesia Plan: General   Post-op Pain Management: Ofirmev IV (intra-op)*   Induction: Intravenous  PONV Risk Score and Plan: 3 and Ondansetron, Dexamethasone, Midazolam and Treatment may vary due to age or medical condition  Airway Management Planned: LMA  Additional Equipment: None  Intra-op Plan:   Post-operative Plan: Extubation in OR  Informed Consent: I have reviewed the patients History and Physical, chart, labs and discussed the procedure including the risks, benefits and alternatives for the proposed anesthesia with the patient or authorized representative who has indicated his/her understanding and acceptance.     Dental advisory given  Plan Discussed with: CRNA and Surgeon  Anesthesia Plan Comments: (Discussed risks of anesthesia with patient, including PONV, sore throat, lip/dental/eye damage. Rare risks discussed as well, such as cardiorespiratory and neurological sequelae, and allergic reactions. Discussed the role of CRNA in patient's perioperative care. Patient understands. NPO appropriate)        Anesthesia Quick Evaluation

## 2023-07-05 NOTE — Anesthesia Procedure Notes (Signed)
Procedure Name: LMA Insertion Date/Time: 07/05/2023 4:02 PM  Performed by: Malva Cogan, CRNAPre-anesthesia Checklist: Patient identified, Patient being monitored, Timeout performed, Emergency Drugs available and Suction available Patient Re-evaluated:Patient Re-evaluated prior to induction Oxygen Delivery Method: Circle system utilized Preoxygenation: Pre-oxygenation with 100% oxygen Induction Type: IV induction Ventilation: Mask ventilation without difficulty LMA: LMA inserted LMA Size: 4.0 Tube type: Oral Number of attempts: 1 Placement Confirmation: positive ETCO2 and breath sounds checked- equal and bilateral Tube secured with: Tape Dental Injury: Teeth and Oropharynx as per pre-operative assessment

## 2023-07-05 NOTE — Discharge Instructions (Addendum)
Orthopedic discharge instructions: Keep splint dry and intact. Keep hand elevated above heart level. Apply ice to affected area frequently. Take ibuprofen 600 mg TID with meals for 3-5 days, then as necessary. Take pain medication as prescribed or ES Tylenol when needed.  Keep appointment with Marisue Humble at OT on Friday as scheduled. Return for follow-up in 10-14 days or as scheduled.  AMBULATORY SURGERY  DISCHARGE INSTRUCTIONS   The drugs that you were given will stay in your system until tomorrow so for the next 24 hours you should not:  Drive an automobile Make any legal decisions Drink any alcoholic beverage   You may resume regular meals tomorrow.  Today it is better to start with liquids and gradually work up to solid foods.  You may eat anything you prefer, but it is better to start with liquids, then soup and crackers, and gradually work up to solid foods.   Please notify your doctor immediately if you have any unusual bleeding, trouble breathing, redness and pain at the surgery site, drainage, fever, or pain not relieved by medication.    Additional Instructions:    Please contact your physician with any problems or Same Day Surgery at [phone], Monday through Friday 6 am to 4 pm, or [place] at [facility] number at [phone].

## 2023-07-05 NOTE — Transfer of Care (Signed)
Immediate Anesthesia Transfer of Care Note  Patient: Wanda Smith  Procedure(s) Performed: MANIPULATION UNDER ANESTHESIA OF RIGHT INDEX AND LONG FINGERS WITH POSSIBLE CAPSULAR OF RIGHT INDEX AND LONG MCP JOINTS (Right: Finger)  Patient Location: PACU  Anesthesia Type:General  Level of Consciousness: drowsy  Airway & Oxygen Therapy: Patient Spontanous Breathing and Patient connected to face mask oxygen  Post-op Assessment: Report given to RN and Post -op Vital signs reviewed and stable  Post vital signs: Reviewed and stable  Last Vitals:  Vitals Value Taken Time  BP 94/59 07/05/23 1719  Temp    Pulse 79 07/05/23 1727  Resp 25 07/05/23 1727  SpO2 99 % 07/05/23 1727  Vitals shown include unfiled device data.  Last Pain:  Vitals:   07/05/23 1354  TempSrc: Temporal  PainSc: 0-No pain         Complications: No notable events documented.

## 2023-07-06 ENCOUNTER — Encounter: Payer: Self-pay | Admitting: Surgery

## 2023-07-08 ENCOUNTER — Ambulatory Visit: Payer: No Typology Code available for payment source | Attending: Student | Admitting: Occupational Therapy

## 2023-07-08 ENCOUNTER — Encounter: Payer: Self-pay | Admitting: Occupational Therapy

## 2023-07-08 DIAGNOSIS — M25641 Stiffness of right hand, not elsewhere classified: Secondary | ICD-10-CM | POA: Diagnosis present

## 2023-07-08 DIAGNOSIS — R600 Localized edema: Secondary | ICD-10-CM | POA: Diagnosis present

## 2023-07-08 DIAGNOSIS — M6281 Muscle weakness (generalized): Secondary | ICD-10-CM

## 2023-07-08 DIAGNOSIS — L905 Scar conditions and fibrosis of skin: Secondary | ICD-10-CM | POA: Diagnosis present

## 2023-07-08 DIAGNOSIS — M25631 Stiffness of right wrist, not elsewhere classified: Secondary | ICD-10-CM | POA: Diagnosis present

## 2023-07-08 NOTE — Therapy (Addendum)
OUTPATIENT OCCUPATIONAL THERAPY ORTHO  RE-EVAL and TREATMENT  Patient Name: Wanda Smith MRN: [id number] DOB:Mar 16, 1961, 62 y.o., female   PCP: Merlinda Frederick NP REFERRING PROVIDER: Marney Doctor PA  END OF SESSION:  OT End of Session - 07/08/23 1256     Visit Number 15    Number of Visits 25    Date for OT Re-Evaluation 09/02/23    OT Start Time 0900    OT Stop Time 0948    OT Time Calculation (min) 48 min    Activity Tolerance Patient tolerated treatment well    Behavior During Therapy [facility] for tasks assessed/performed             Past Medical History:  Diagnosis Date   Anemia    Anxiety    Arthritis    knee (R)   Asthma    Carpal tunnel syndrome    Cellulitis of right hand 03/30/2023   Chronic kidney disease, stage 3a (HCC)    Dr Cherylann Ratel   COPD (chronic obstructive pulmonary disease) (HCC)    Depression    Diabetes mellitus without complication (HCC)    Dyspnea    GERD (gastroesophageal reflux disease)    occ   Hyperlipidemia    Hypertension    Hypothyroidism    Increased serum lipids    MRSA infection 03/30/2023   right hand   Osteoporosis    Severe sepsis (HCC) 03/30/2023   Type 2 diabetes mellitus with renal manifestations (HCC)    Wears contact lenses    Past Surgical History:  Procedure Laterality Date   ABDOMINAL HYSTERECTOMY  1998   CARPAL TUNNEL RELEASE Right 07/26/2018   Procedure: CARPAL TUNNEL RELEASE ENDOSCOPIC;  Surgeon: Christena Flake, MD;  Location: [facility];  Service: Orthopedics;  Laterality: Right;  diabetic - oral meds   CHOLECYSTECTOMY  2013   CLOSED REDUCTION METACARPAL WITH PERCUTANEOUS PINNING Right 07/05/2023   Procedure: MANIPULATION UNDER ANESTHESIA OF RIGHT INDEX AND LONG FINGERS WITH POSSIBLE CAPSULAR OF RIGHT INDEX AND LONG MCP JOINTS;  Surgeon: Christena Flake, MD;  Location: [facility];  Service: Orthopedics;  Laterality: Right;   COLONOSCOPY WITH ESOPHAGOGASTRODUODENOSCOPY (EGD)  03/03/2012   COLONOSCOPY WITH  ESOPHAGOGASTRODUODENOSCOPY (EGD)  02/23/2022   COLONOSCOPY WITH ESOPHAGOGASTRODUODENOSCOPY (EGD)  01/16/1991   COLONOSCOPY WITH PROPOFOL N/A 01/13/2018   Procedure: COLONOSCOPY WITH PROPOFOL;  Surgeon: Scot Jun, MD;  Location: [facility];  Service: Endoscopy;  Laterality: N/A;   HERNIA REPAIR  1966   I & D EXTREMITY Right 04/01/2023   Procedure: IRRIGATION AND DEBRIDEMENT EXTREMITY PREVENA PLACEMENT;  Surgeon: Christena Flake, MD;  Location: [facility];  Service: Orthopedics;  Laterality: Right;   I-131 THYROID ABLATION     INCISION AND DRAINAGE ABSCESS Right 03/30/2023   Procedure: INCISION AND DRAINAGE ABSCESS RIGHT HAND;  Surgeon: Christena Flake, MD;  Location: [facility];  Service: Orthopedics;  Laterality: Right;   INCISION AND DRAINAGE OF WOUND Right 04/04/2023   Procedure: IRRIGATION AND DEBRIDEMENT RIGHT HAND DORSAL AND PALMAR INCISIONS WITH DELAYED PRIMARY CLOSURE;  Surgeon: Christena Flake, MD;  Location: [facility];  Service: Orthopedics;  Laterality: Right;   ROTATOR CUFF REPAIR Left 2011   SHOULDER ARTHROSCOPY WITH SUBACROMIAL DECOMPRESSION, ROTATOR CUFF REPAIR AND BICEP TENDON REPAIR Right 10/21/2021   Procedure: SHOULDER ARTHROSCOPY WITH DEBRIDEMENT, DECOMPRESSION, BICEPS TENODESIS.;  Surgeon: Christena Flake, MD;  Location: [facility];  Service: Orthopedics;  Laterality: Right;   TUBAL LIGATION  1993   Patient Active Problem List  Diagnosis Date Noted   MRSA infection 04/04/2023   Abscess of right hand 03/31/2023   Cellulitis of right hand 03/30/2023   Depression with anxiety 03/30/2023   HLD (hyperlipidemia) 03/30/2023   Type II diabetes mellitus with renal manifestations (HCC) 03/30/2023   Chronic kidney disease, stage 3a (HCC) 03/30/2023   Abnormal LFTs 03/30/2023   Severe sepsis (HCC) 03/30/2023   Hypothyroidism    Hypertension    COPD (chronic obstructive pulmonary disease) (HCC)     ONSET DATE: 03/30/23  REFERRING DIAG:Abscess of R hand -3 irrigation  surgeries/Capsulectomy 2nd and 3rd MC - release of collateral ligaments  THERAPY DIAG:  Scar condition and fibrosis of skin  Stiffness of right hand, not elsewhere classified  Muscle weakness (generalized)  Localized edema  Stiffness of right wrist, not elsewhere classified  Rationale for Evaluation and Treatment: Rehabilitation  SUBJECTIVE:   SUBJECTIVE STATEMENT: I am back.  I had surgery 3 days ago. Dr Joice Lofts said you will take bandages and soft  cast and do what needs to be done   pt accompanied by: self  PERTINENT HISTORY:  Ortho note 04/20/23 [name] is a 62 y.o. female who presents today for a skin check status post multiple irrigation and debridements involving right dorsal and volar hand wounds and possible suture removal. The patient was admitted to the hospital after suffering a dog scratch which eventually became more swollen and painful. The patient underwent a CT scan of the right hand which demonstrated a abscess formation along the volar aspect of the hand initially. She underwent 1 irrigation debridement however the patient continued to have swelling and pain prompting a repeat CT scan which demonstrated abscess formation along the dorsal aspect of the hand. The patient underwent a second irrigation debridement and a final third procedure performed by Dr. Joice Lofts on 04/04/2023. The patient was evaluated last week where skin inspection demonstrated excellent granulation without any significant erythema or purulent drainage. The patient then had a follow-up with infectious disease where repeat CRP came back at a normal level. The patient does still have a oral prescription for linezolid which she is scheduled to take until end this week. She denies any trauma or injury affecting the right hand since her last evaluation. She does report an occasional burning discomfort in the index finger. She is still taking occasional oxycodone. She reports a 0 out of 10 pain score at  today's visit. Stitches remove- and to finish antibiotics and refer to OT. ON 07/05/2023 - Pt had by Dr Joice Lofts   Attempted closed manipulation under anesthesia,  then had open capsulectomies with release of radial and ulnar collateral ligaments of both index and long finger MCP joints.- refer to OT to start ROM , splinting and tx 3 days later    PRECAUTIONS: None     WEIGHT BEARING RESTRICTIONS: No  PAIN:  Are you having pain? 3/10 at the most   FALLS: Has patient fallen in last 6 months? No  LIVING ENVIRONMENT: Lives with: lives with their family and lives with their spouse   PLOF: Work on [employer], gym 3 x wk , do own cooking and housework  PATIENT GOALS: Get my motion and strength back in the right hand so that I can carry and grip and hold objects  NEXT MD VISIT: 11th Nov OBJECTIVE:   HAND DOMINANCE: Right   UPPER EXTREMITY ROM:     Active ROM Right eval Left eval R  04/29/23 05/03/23 05/16/23 R 9/16/24R 06/02/23 R R  07/08/23  Shoulder flexion          Shoulder abduction          Shoulder adduction          Shoulder extension          Shoulder internal rotation          Shoulder external rotation          Elbow flexion          Elbow extension          Wrist flexion 35 110 50 50 63 72 80 60  Wrist extension 48 85 60 55 65 70 70 50  Wrist ulnar deviation 24      35 30  Wrist radial deviation 20      25 20   Wrist pronation          Wrist supination          (Blank rows = not tested)  Active ROM Right eval Left eval R   04/29/23 R  05/12/23 R  05/16/23 R 05/23/23 R 06/02/23 R 06/09/23 R 07/08/23  Thumb MCP (0-60) 70          Thumb IP (0-80) 35          Thumb Radial abd/add (0-55) 45    50  50 50    Thumb Palmar abd/add (0-45) 45    50  65 55    Thumb Opposition to Small Finger Opposition to 3rd, 4th and 5th     Opposition to all digits   Opposition to base of 5th     Index MCP (0-90) 30   50 60 60( 65 66 70 70  Index PIP (0-100) 45( ext -30   55 ( ext -20 60 (  ext -10) 60 ( blocked 70) 60 66 70 50  Index DIP (0-70)       50      Long MCP (0-90)  45   50 60 65 70 70 75 ( 80 in session)  Ext -50 flexion65  Long PIP (0-100)  90( ext -20   95 ( ext -15 95 ( ext -5)  100 100 100 90  Long DIP (0-70)             Ring MCP (0-90)  45   60 70 75 75 80 80( 90 in session Ext -45 flexion 70  Ring PIP (0-100)  100 (ext -10   100( ext 0  100 - full ext  100 100 100 100  Ring DIP (0-70)             Little MCP (0-90)  75   75 80  80 85 85( 90 in session) 65  Little PIP (0-100)  90 (ext 0   100 ( ext 0 100- full ext   100 100 100 10  Little DIP (0-70)             (Blank rows = not tested) In session  AROM increase to 80 degrees at [facility]  and PIP's 100 4th and 5th -   05/23/23 GRIP R 14 lbs L 55 lbs, lat pinch R 4 lbs L 14 lbs and 3 point pinch R 5 lbs and L 10 lbs   05/30/23: Lat grip R 7 lbs , 14 L - 3 point R 6lbs , L 10 lbs   06/07/23 GRIP R 15 lbs L 55 lbs, lat pinch R 7 lbs L 14 lbs and 3 point  pinch R 8 lbs and L 10 lbs  COORDINATION: Decreasing significantly because of stiffness and scar tissue in the second digit  EDEMA: R hand   COGNITION: Overall cognitive status: Within functional limits for tasks assessed    OBSERVATIONS:  Patient arrived with soft cast on.  Removed easily with no issues.  Xeroform on and applied 2 x 2 gauze under Tubigrip D over hand   TODAY'S TREATMENT:                                                                                                                              DATE:  07/08/23 Verbal order from Dr. Joice Lofts 07/08/23 - 8:33 -done a dorsal capsulectomy with release of radial and ulnar collateral ligaments to the right second and third metacarpal.  Did not had any tendon involvement.  Can continue with therapy.  Would need some type of static or dynamic splint.  Measurements taken to all digits including wrist.  See flowsheet. Patient with increased swelling and bruising especially second and third digits as well as  metacarpals and hand. Contrast done prior to review of home exercises Followed by fabrication of a dorsal hand-based custom MC block splint-keeping metacarpals about 80 degrees of flexion with PIP extension. Patient to wear between exercises as well as nighttime. Reviewed exercises: 5 times a day contrast followed by abduction and adduction of digits Metacarpal flexion with IP extension Metacarpal flexion with PIP flexion Composite fist 10-12 reps keeping pain under 2/10 Fit patient with silicone sleeves to decrease edema in second and third digits in combination with Tubigrip Patient educated on use of splint as well as home exercises and precautions.    PATIENT EDUCATION: Education details: Findings of evaluation and home program Person educated: Patient Education method: Explanation, Demonstration, Tactile cues, Verbal cues, and Handouts Education comprehension: verbalized understanding, returned demonstration, verbal cues required, tactile cues required, and needs further education   GOALS: Goals reviewed with patient? Yes  SHORT TERM GOALS: Target date: 4 wks  Patient to be independent in home program to decrease scar tissue and edema - increased extension and flexion of digits as well as wrist Baseline: Severe stiffness in digits, i 3 days ago surgery stitches in place  this date- Goal status: INITIAL   LONG TERM GOALS: Target date: 8 wks Flexion of right digits improved for patient to touch palm to initiate use in ADLs with no increase symptoms Baseline: Surgery done 3 days ago -capsulectomy -MC flexion 65 to 70 degrees coming in.  PIP 5200 degrees.   Goal status: INITIAL   2.  Patient increase extension of right digits 2nd thru 4th to be able to don gloves, wash face and reach aroundcylinder objects without issues Baseline: 3 days ago patient had capsulectomy.  Extension of metacarpals -50-second and -40 degrees at third Goal status: INITIAL   3.  Scar tissue  improved for patient to be able to make composite fist as well as increase  extension to don gloves and put hand in pocket Baseline: 3 days ago had surgery.  Stitches in place.  Decreased flexion and extension with increased edema  goal status: INITIAL   4.  Right grip and prehension strength increased to more than 75% compared to the left for patient to hold weight to work out and cook Baseline: 3 days postop from capsulectomy with ligament release on second and third metacarpal no strength  tested Goal status: INITIAL   5.  R wrist AROM increase to WNL to push and pull door open without increase symptoms  Baseline:  wrist flxion 60, ext 50 Goal status: INITIAL  ASSESSMENT:  CLINICAL IMPRESSION: Patient presents at occupational therapy evaluation 3 days postop capsulectomy of right second and third metacarpal with release of collateral ligaments.  Patient arrived with soft cast in place.  Patient had earlier this year multiple irrigation and debridements involving right dorsal and volar hand wounds -with abscess and infection.  Patient was seen by therapy in September.  But limited by scar tissue and joint capsule tightness.  Patient arrived today with soft cast in place.  Removed and tolerating well.  Xeroform on 2 incisions and applied 2 x 2 gauze with Tubigrip D over hand.  Patient with increased edema and bruising over hand as well as second and third digits.  Contrast done prior to review of active range of motion home exercises as well as fabrication of hand-based MC flexion splint.  Splint done at about 80 degrees of MC flexion and to be wore at nighttime as well as during the day in between exercises.  Patient tolerated reevaluation very well as well as review of home exercises and splint wearing.  Incisions patient showed increased MC flexion compared to prior to surgery and tolerated review of home exercises well.  Patient to to decrease edema and keep pain under 2/10 with active and active  assisted range of motion.  Patient limited in functional use of right dominant hand.  Patient can benefit from skilled OT services to decrease edema, scar tissue and increase range of motion in flexion and extension of digits as well as wrist, increase strength to return to prior level of function. PERFORMANCE DEFICITS: in functional skills including ADLs, IADLs, coordination, dexterity, edema, ROM, strength, pain, flexibility, and UE functional use,   and psychosocial skills including habits and routines and behaviors.   IMPAIRMENTS: are limiting patient from ADLs, IADLs, rest and sleep, work, play, leisure, and social participation.   COMORBIDITIES: has no other co-morbidities that affects occupational performance. Patient will benefit from skilled OT to address above impairments and improve overall function.  MODIFICATION OR ASSISTANCE TO COMPLETE EVALUATION: No modification of tasks or assist necessary to complete an evaluation.  OT OCCUPATIONAL PROFILE AND HISTORY: Problem focused assessment: Including review of records relating to presenting problem.  CLINICAL DECISION MAKING: LOW - limited treatment options, no task modification necessary  REHAB POTENTIAL: Good  EVALUATION COMPLEXITY: Low  PLAN:  OT FREQUENCY: 1- 2x/week depending on progress  OT DURATION: 8 weeks  PLANNED INTERVENTIONS: ADL's therapeutic exercise, therapeutic activity, manual therapy, scar mobilization, passive range of motion, splinting, paraffin, fluidotherapy, contrast bath, patient/family education, and DME and/or AE instructions  CONSULTED AND AGREED WITH PLAN OF CARE: Patient   Oletta Cohn, OTR/L,CLT 07/08/2023, 12:58 PM

## 2023-07-11 ENCOUNTER — Ambulatory Visit: Payer: No Typology Code available for payment source | Admitting: Occupational Therapy

## 2023-07-11 DIAGNOSIS — M25631 Stiffness of right wrist, not elsewhere classified: Secondary | ICD-10-CM

## 2023-07-11 DIAGNOSIS — L905 Scar conditions and fibrosis of skin: Secondary | ICD-10-CM | POA: Diagnosis not present

## 2023-07-11 DIAGNOSIS — M6281 Muscle weakness (generalized): Secondary | ICD-10-CM

## 2023-07-11 DIAGNOSIS — R600 Localized edema: Secondary | ICD-10-CM

## 2023-07-11 DIAGNOSIS — M25641 Stiffness of right hand, not elsewhere classified: Secondary | ICD-10-CM

## 2023-07-11 NOTE — Progress Notes (Signed)
I would consider this to be a grade 3 chronic kidney disease due to diabetes.  Thank you!

## 2023-07-11 NOTE — Therapy (Addendum)
OUTPATIENT OCCUPATIONAL THERAPY ORTHO  TREATMENT  Patient Name: Wanda Smith MRN: [id number] DOB:Jan 09, 1961, 62 y.o., female   PCP: Merlinda Frederick NP REFERRING PROVIDER: Marney Doctor PA  END OF SESSION:  OT End of Session - 07/11/23 1317     Visit Number 16    Number of Visits 25    Date for OT Re-Evaluation 09/02/23    OT Start Time 1235    OT Stop Time 1314    OT Time Calculation (min) 39 min    Activity Tolerance Patient tolerated treatment well    Behavior During Therapy [facility] for tasks assessed/performed             Past Medical History:  Diagnosis Date   Anemia    Anxiety    Arthritis    knee (R)   Asthma    Carpal tunnel syndrome    Cellulitis of right hand 03/30/2023   Chronic kidney disease, stage 3a (HCC)    Dr Cherylann Ratel   COPD (chronic obstructive pulmonary disease) (HCC)    Depression    Diabetes mellitus without complication (HCC)    Dyspnea    GERD (gastroesophageal reflux disease)    occ   Hyperlipidemia    Hypertension    Hypothyroidism    Increased serum lipids    MRSA infection 03/30/2023   right hand   Osteoporosis    Severe sepsis (HCC) 03/30/2023   Type 2 diabetes mellitus with renal manifestations (HCC)    Wears contact lenses    Past Surgical History:  Procedure Laterality Date   ABDOMINAL HYSTERECTOMY  1998   CARPAL TUNNEL RELEASE Right 07/26/2018   Procedure: CARPAL TUNNEL RELEASE ENDOSCOPIC;  Surgeon: Christena Flake, MD;  Location: [facility];  Service: Orthopedics;  Laterality: Right;  diabetic - oral meds   CHOLECYSTECTOMY  2013   CLOSED REDUCTION METACARPAL WITH PERCUTANEOUS PINNING Right 07/05/2023   Procedure: MANIPULATION UNDER ANESTHESIA OF RIGHT INDEX AND LONG FINGERS WITH POSSIBLE CAPSULAR OF RIGHT INDEX AND LONG MCP JOINTS;  Surgeon: Christena Flake, MD;  Location: [facility];  Service: Orthopedics;  Laterality: Right;   COLONOSCOPY WITH ESOPHAGOGASTRODUODENOSCOPY (EGD)  03/03/2012   COLONOSCOPY WITH  ESOPHAGOGASTRODUODENOSCOPY (EGD)  02/23/2022   COLONOSCOPY WITH ESOPHAGOGASTRODUODENOSCOPY (EGD)  01/16/1991   COLONOSCOPY WITH PROPOFOL N/A 01/13/2018   Procedure: COLONOSCOPY WITH PROPOFOL;  Surgeon: Scot Jun, MD;  Location: [facility];  Service: Endoscopy;  Laterality: N/A;   HERNIA REPAIR  1966   I & D EXTREMITY Right 04/01/2023   Procedure: IRRIGATION AND DEBRIDEMENT EXTREMITY PREVENA PLACEMENT;  Surgeon: Christena Flake, MD;  Location: [facility];  Service: Orthopedics;  Laterality: Right;   I-131 THYROID ABLATION     INCISION AND DRAINAGE ABSCESS Right 03/30/2023   Procedure: INCISION AND DRAINAGE ABSCESS RIGHT HAND;  Surgeon: Christena Flake, MD;  Location: [facility];  Service: Orthopedics;  Laterality: Right;   INCISION AND DRAINAGE OF WOUND Right 04/04/2023   Procedure: IRRIGATION AND DEBRIDEMENT RIGHT HAND DORSAL AND PALMAR INCISIONS WITH DELAYED PRIMARY CLOSURE;  Surgeon: Christena Flake, MD;  Location: [facility];  Service: Orthopedics;  Laterality: Right;   ROTATOR CUFF REPAIR Left 2011   SHOULDER ARTHROSCOPY WITH SUBACROMIAL DECOMPRESSION, ROTATOR CUFF REPAIR AND BICEP TENDON REPAIR Right 10/21/2021   Procedure: SHOULDER ARTHROSCOPY WITH DEBRIDEMENT, DECOMPRESSION, BICEPS TENODESIS.;  Surgeon: Christena Flake, MD;  Location: [facility];  Service: Orthopedics;  Laterality: Right;   TUBAL LIGATION  1993   Patient Active Problem List   Diagnosis Date  Noted   MRSA infection 04/04/2023   Abscess of right hand 03/31/2023   Cellulitis of right hand 03/30/2023   Depression with anxiety 03/30/2023   HLD (hyperlipidemia) 03/30/2023   Type II diabetes mellitus with renal manifestations (HCC) 03/30/2023   Chronic kidney disease, stage 3a (HCC) 03/30/2023   Abnormal LFTs 03/30/2023   Severe sepsis (HCC) 03/30/2023   Hypothyroidism    Hypertension    COPD (chronic obstructive pulmonary disease) (HCC)     ONSET DATE: 03/30/23  REFERRING DIAG:Abscess of R hand -3 irrigation  surgeries/Capsulectomy 2nd and 3rd MC - release of collateral ligaments  THERAPY DIAG:  Scar condition and fibrosis of skin  Stiffness of right hand, not elsewhere classified  Muscle weakness (generalized)  Localized edema  Stiffness of right wrist, not elsewhere classified  Rationale for Evaluation and Treatment: Rehabilitation  SUBJECTIVE:   SUBJECTIVE STATEMENT: Tried to do my exercises about 5 x day - maybe little more when sitting around- no bleeding for the last 24 to 48 hrs- I can tell it is bending more  pt accompanied by: self  PERTINENT HISTORY:  Ortho note 04/20/23 Wanda Smith is a 62 y.o. female who presents today for a skin check status post multiple irrigation and debridements involving right dorsal and volar hand wounds and possible suture removal. The patient was admitted to the hospital after suffering a dog scratch which eventually became more swollen and painful. The patient underwent a CT scan of the right hand which demonstrated a abscess formation along the volar aspect of the hand initially. She underwent 1 irrigation debridement however the patient continued to have swelling and pain prompting a repeat CT scan which demonstrated abscess formation along the dorsal aspect of the hand. The patient underwent a second irrigation debridement and a final third procedure performed by Dr. Joice Lofts on 04/04/2023. The patient was evaluated last week where skin inspection demonstrated excellent granulation without any significant erythema or purulent drainage. The patient then had a follow-up with infectious disease where repeat CRP came back at a normal level. The patient does still have a oral prescription for linezolid which she is scheduled to take until end this week. She denies any trauma or injury affecting the right hand since her last evaluation. She does report an occasional burning discomfort in the index finger. She is still taking occasional oxycodone. She reports a 0  out of 10 pain score at today's visit. Stitches remove- and to finish antibiotics and refer to OT. ON 07/05/2023 - Pt had by Dr Joice Lofts   Attempted closed manipulation under anesthesia,  then had open capsulectomies with release of radial and ulnar collateral ligaments of both index and long finger MCP joints.- refer to OT to start ROM , splinting and tx 3 days later    PRECAUTIONS: None     WEIGHT BEARING RESTRICTIONS: No  PAIN:  Are you having pain? 3/10 at the most   FALLS: Has patient fallen in last 6 months? No  LIVING ENVIRONMENT: Lives with: lives with their family and lives with their spouse   PLOF: Work on [employer], gym 3 x wk , do own cooking and housework  PATIENT GOALS: Get my motion and strength back in the right hand so that I can carry and grip and hold objects  NEXT MD VISIT: 11th Nov OBJECTIVE:   HAND DOMINANCE: Right   UPPER EXTREMITY ROM:     Active ROM Right eval Left eval R  04/29/23 05/03/23 05/16/23 R 9/16/24R 06/02/23 R  R 07/08/23  Shoulder flexion          Shoulder abduction          Shoulder adduction          Shoulder extension          Shoulder internal rotation          Shoulder external rotation          Elbow flexion          Elbow extension          Wrist flexion 35 110 50 50 63 72 80 60  Wrist extension 48 85 60 55 65 70 70 50  Wrist ulnar deviation 24      35 30  Wrist radial deviation 20      25 20   Wrist pronation          Wrist supination          (Blank rows = not tested)  Active ROM Right eval Left eval R   04/29/23 R  05/12/23 R  05/16/23 R 05/23/23 R 06/02/23 R 06/09/23 R 07/08/23  Thumb MCP (0-60) 70          Thumb IP (0-80) 35          Thumb Radial abd/add (0-55) 45    50  50 50    Thumb Palmar abd/add (0-45) 45    50  65 55    Thumb Opposition to Small Finger Opposition to 3rd, 4th and 5th     Opposition to all digits   Opposition to base of 5th     Index MCP (0-90) 30   50 60 60( 65 66 70 70  Index PIP (0-100) 45( ext  -30   55 ( ext -20 60 ( ext -10) 60 ( blocked 70) 60 66 70 50  Index DIP (0-70)       50      Long MCP (0-90)  45   50 60 65 70 70 75 ( 80 in session)  Ext -50 flexion65  Long PIP (0-100)  90( ext -20   95 ( ext -15 95 ( ext -5)  100 100 100 90  Long DIP (0-70)             Ring MCP (0-90)  45   60 70 75 75 80 80( 90 in session Ext -45 flexion 70  Ring PIP (0-100)  100 (ext -10   100( ext 0  100 - full ext  100 100 100 100  Ring DIP (0-70)             Little MCP (0-90)  75   75 80  80 85 85( 90 in session) 65  Little PIP (0-100)  90 (ext 0   100 ( ext 0 100- full ext   100 100 100 10  Little DIP (0-70)             (Blank rows = not tested) In session  AROM increase to 80 degrees at [facility]  and PIP's 100 4th and 5th -   05/23/23 GRIP R 14 lbs L 55 lbs, lat pinch R 4 lbs L 14 lbs and 3 point pinch R 5 lbs and L 10 lbs   05/30/23: Lat grip R 7 lbs , 14 L - 3 point R 6lbs , L 10 lbs   06/07/23 GRIP R 15 lbs L 55 lbs, lat pinch R 7 lbs L 14 lbs and 3  point pinch R 8 lbs and L 10 lbs  COORDINATION: Decreasing significantly because of stiffness and scar tissue in the second digit  EDEMA: R hand   COGNITION: Overall cognitive status: Within functional limits for tasks assessed    OBSERVATIONS:  Patient arrived with soft cast on.  Removed easily with no issues.  Xeroform on and applied 2 x 2 gauze under Tubigrip D over hand   TODAY'S TREATMENT:                                                                                                                              DATE:  07/11/23 Verbal order from Dr. Joice Lofts 07/08/23 - 8:33 -done a dorsal capsulectomy with release of radial and ulnar collateral ligaments to the right second and third metacarpal.  Did not had any tendon involvement.  Can continue with therapy.  Would need some type of static or dynamic splint.  Pt show no drainage- applied clean tubigrip D over hand while exercising Contrast done 8 min - decrease edema and stiffness prior to  ROM Patient with increased swelling and bruising  still but improving especially second and third digits as well as metacarpals and hand. Apply moldskin inside dorsal hand-based custom MC block splint for cushioning over dorsal hand where stitches is - for cushioning -keeping metacarpals about 80 degrees of flexion with PIP extension. Patient to wear between exercises as well as nighttime. Reviewed exercises: 5 times a day contrast followed by abduction and adduction of digits-- doing well Metacarpal flexion with IP extension - to about 80 degrees in session - PROM 90 Metacarpal flexion at 80 with PIP flexion- done coban flexion wrap today 2 x 1 min  With PROM - keeping MC at 90 - initially tight but increase in session Composite fist in MC splint to red foam roller - then add 2cm and 4 cm blue foam block Squeezing 3rd thru 5th into 2 cm -and wide part 4 cm  with 2nd - touching 2 cm with 2nd  10-12 reps keeping pain under 2/10 In session  AROM increase to 80 degrees at MC's  and PIP's 100 4th and 5th - 2nd 60, 3rd 90  silicone sleeves to decrease edema in second and third digits in combination with Tubigrip Patient educated on use of splint as well as home exercises and precautions.    PATIENT EDUCATION: Education details: Findings of evaluation and home program Person educated: Patient Education method: Explanation, Demonstration, Tactile cues, Verbal cues, and Handouts Education comprehension: verbalized understanding, returned demonstration, verbal cues required, tactile cues required, and needs further education   GOALS: Goals reviewed with patient? Yes  SHORT TERM GOALS: Target date: 4 wks  Patient to be independent in home program to decrease scar tissue and edema - increased extension and flexion of digits as well as wrist Baseline: Severe stiffness in digits, i 3 days ago surgery stitches in place  this date- Goal status: INITIAL  LONG TERM GOALS: Target date: 8  wks Flexion of right digits improved for patient to touch palm to initiate use in ADLs with no increase symptoms Baseline: Surgery done 3 days ago -capsulectomy -MC flexion 65 to 70 degrees coming in.  PIP 5200 degrees.   Goal status: INITIAL   2.  Patient increase extension of right digits 2nd thru 4th to be able to don gloves, wash face and reach aroundcylinder objects without issues Baseline: 3 days ago patient had capsulectomy.  Extension of metacarpals -50-second and -40 degrees at third Goal status: INITIAL   3.  Scar tissue improved for patient to be able to make composite fist as well as increase extension to don gloves and put hand in pocket Baseline: 3 days ago had surgery.  Stitches in place.  Decreased flexion and extension with increased edema  goal status: INITIAL   4.  Right grip and prehension strength increased to more than 75% compared to the left for patient to hold weight to work out and cook Baseline: 3 days postop from capsulectomy with ligament release on second and third metacarpal no strength  tested Goal status: INITIAL   5.  R wrist AROM increase to WNL to push and pull door open without increase symptoms  Baseline:  wrist flxion 60, ext 50 Goal status: INITIAL  ASSESSMENT:  CLINICAL IMPRESSION: Patient presents at occupational therapy evaluation 3 days postop capsulectomy of right second and third metacarpal with release of collateral ligaments.  Patient arrived with soft cast in place.  Patient had earlier this year multiple irrigation and debridements involving right dorsal and volar hand wounds -with abscess and infection.  Patient was seen by therapy in September.  But limited by scar tissue and joint capsule tightness.  Patient arrived 07/08/23 with soft cast in place.  Pt arrive with hand base hand-based MC flexion splint.  Splint done at about 80 degrees of MC flexion and to be wore at nighttime as well as during the day in between exercises. T tolerate HEP  and ROM with contrast prior well - IPt showing increased MC flexion  and PIP compared to prior to surgery and tolerated review of home exercises well.  Patient to to decrease edema and keep pain under 2/10 with active and active assisted range of motion.  Patient limited in functional use of right dominant hand.  Patient can benefit from skilled OT services to decrease edema, scar tissue and increase range of motion in flexion and extension of digits as well as wrist, increase strength to return to prior level of function. PERFORMANCE DEFICITS: in functional skills including ADLs, IADLs, coordination, dexterity, edema, ROM, strength, pain, flexibility, and UE functional use,   and psychosocial skills including habits and routines and behaviors.   IMPAIRMENTS: are limiting patient from ADLs, IADLs, rest and sleep, work, play, leisure, and social participation.   COMORBIDITIES: has no other co-morbidities that affects occupational performance. Patient will benefit from skilled OT to address above impairments and improve overall function.  MODIFICATION OR ASSISTANCE TO COMPLETE EVALUATION: No modification of tasks or assist necessary to complete an evaluation.  OT OCCUPATIONAL PROFILE AND HISTORY: Problem focused assessment: Including review of records relating to presenting problem.  CLINICAL DECISION MAKING: LOW - limited treatment options, no task modification necessary  REHAB POTENTIAL: Good  EVALUATION COMPLEXITY: Low  PLAN:  OT FREQUENCY: 3 x wk initially - decrease depending on progress  OT DURATION: 8 weeks  PLANNED INTERVENTIONS: ADL's therapeutic exercise, therapeutic activity, manual therapy,  scar mobilization, passive range of motion, splinting, paraffin, fluidotherapy, contrast bath, patient/family education, and DME and/or AE instructions  CONSULTED AND AGREED WITH PLAN OF CARE: Patient   Oletta Cohn, OTR/L,CLT 07/11/2023, 1:19 PM

## 2023-07-13 ENCOUNTER — Ambulatory Visit: Payer: No Typology Code available for payment source | Admitting: Occupational Therapy

## 2023-07-13 DIAGNOSIS — L905 Scar conditions and fibrosis of skin: Secondary | ICD-10-CM | POA: Diagnosis not present

## 2023-07-13 DIAGNOSIS — M25631 Stiffness of right wrist, not elsewhere classified: Secondary | ICD-10-CM

## 2023-07-13 DIAGNOSIS — M25641 Stiffness of right hand, not elsewhere classified: Secondary | ICD-10-CM

## 2023-07-13 DIAGNOSIS — M6281 Muscle weakness (generalized): Secondary | ICD-10-CM

## 2023-07-13 DIAGNOSIS — R600 Localized edema: Secondary | ICD-10-CM

## 2023-07-13 NOTE — Therapy (Signed)
OUTPATIENT OCCUPATIONAL THERAPY ORTHO  TREATMENT  Patient Name: Wanda Smith MRN: [id number] DOB:1960-10-26, 62 y.o., female   PCP: Wanda Frederick NP REFERRING PROVIDER: Marney Doctor PA  END OF SESSION:  OT End of Session - 07/13/23 1630     Visit Number 17    Number of Visits 25    Date for OT Re-Evaluation 09/02/23    OT Start Time 1400    OT Stop Time 1438    OT Time Calculation (min) 38 min    Activity Tolerance Patient tolerated treatment well    Behavior During Therapy [facility] for tasks assessed/performed             Past Medical History:  Diagnosis Date   Anemia    Anxiety    Arthritis    knee (R)   Asthma    Carpal tunnel syndrome    Cellulitis of right hand 03/30/2023   Chronic kidney disease, stage 3a (HCC)    Wanda Smith   COPD (chronic obstructive pulmonary disease) (HCC)    Depression    Diabetes mellitus without complication (HCC)    Dyspnea    GERD (gastroesophageal reflux disease)    occ   Hyperlipidemia    Hypertension    Hypothyroidism    Increased serum lipids    MRSA infection 03/30/2023   right hand   Osteoporosis    Severe sepsis (HCC) 03/30/2023   Type 2 diabetes mellitus with renal manifestations (HCC)    Wears contact lenses    Past Surgical History:  Procedure Laterality Date   ABDOMINAL HYSTERECTOMY  1998   CARPAL TUNNEL RELEASE Right 07/26/2018   Procedure: CARPAL TUNNEL RELEASE ENDOSCOPIC;  Surgeon: Wanda Flake, MD;  Location: [facility];  Service: Orthopedics;  Laterality: Right;  diabetic - oral meds   CHOLECYSTECTOMY  2013   CLOSED REDUCTION METACARPAL WITH PERCUTANEOUS PINNING Right 07/05/2023   Procedure: MANIPULATION UNDER ANESTHESIA OF RIGHT INDEX AND LONG FINGERS WITH POSSIBLE CAPSULAR OF RIGHT INDEX AND LONG MCP JOINTS;  Surgeon: Wanda Flake, MD;  Location: [facility];  Service: Orthopedics;  Laterality: Right;   COLONOSCOPY WITH ESOPHAGOGASTRODUODENOSCOPY (EGD)  03/03/2012   COLONOSCOPY WITH  ESOPHAGOGASTRODUODENOSCOPY (EGD)  02/23/2022   COLONOSCOPY WITH ESOPHAGOGASTRODUODENOSCOPY (EGD)  01/16/1991   COLONOSCOPY WITH PROPOFOL N/A 01/13/2018   Procedure: COLONOSCOPY WITH PROPOFOL;  Surgeon: Wanda Jun, MD;  Location: [facility];  Service: Endoscopy;  Laterality: N/A;   HERNIA REPAIR  1966   I & D EXTREMITY Right 04/01/2023   Procedure: IRRIGATION AND DEBRIDEMENT EXTREMITY PREVENA PLACEMENT;  Surgeon: Wanda Flake, MD;  Location: [facility];  Service: Orthopedics;  Laterality: Right;   I-131 THYROID ABLATION     INCISION AND DRAINAGE ABSCESS Right 03/30/2023   Procedure: INCISION AND DRAINAGE ABSCESS RIGHT HAND;  Surgeon: Wanda Flake, MD;  Location: [facility];  Service: Orthopedics;  Laterality: Right;   INCISION AND DRAINAGE OF WOUND Right 04/04/2023   Procedure: IRRIGATION AND DEBRIDEMENT RIGHT HAND DORSAL AND PALMAR INCISIONS WITH DELAYED PRIMARY CLOSURE;  Surgeon: Wanda Flake, MD;  Location: [facility];  Service: Orthopedics;  Laterality: Right;   ROTATOR CUFF REPAIR Left 2011   SHOULDER ARTHROSCOPY WITH SUBACROMIAL DECOMPRESSION, ROTATOR CUFF REPAIR AND BICEP TENDON REPAIR Right 10/21/2021   Procedure: SHOULDER ARTHROSCOPY WITH DEBRIDEMENT, DECOMPRESSION, BICEPS TENODESIS.;  Surgeon: Wanda Flake, MD;  Location: [facility];  Service: Orthopedics;  Laterality: Right;   TUBAL LIGATION  1993   Patient Active Problem List   Diagnosis Date  Noted   MRSA infection 04/04/2023   Abscess of right hand 03/31/2023   Cellulitis of right hand 03/30/2023   Depression with anxiety 03/30/2023   HLD (hyperlipidemia) 03/30/2023   Type II diabetes mellitus with renal manifestations (HCC) 03/30/2023   Chronic kidney disease, stage 3a (HCC) 03/30/2023   Abnormal LFTs 03/30/2023   Severe sepsis (HCC) 03/30/2023   Hypothyroidism    Hypertension    COPD (chronic obstructive pulmonary disease) (HCC)     ONSET DATE: 03/30/23  REFERRING DIAG:Abscess of R hand -3 irrigation  surgeries/Capsulectomy 2nd and 3rd MC - release of collateral ligaments  THERAPY DIAG:  Scar condition and fibrosis of skin  Stiffness of right hand, not elsewhere classified  Muscle weakness (generalized)  Localized edema  Stiffness of right wrist, not elsewhere classified  Rationale for Evaluation and Treatment: Rehabilitation  SUBJECTIVE:   SUBJECTIVE STATEMENT: I used last night for 15 min my flexion glove - did feel okay- I can tell my motion is better  pt accompanied by: self  PERTINENT HISTORY:  Ortho note 04/20/23 Wanda Smith is a 62 y.o. female who presents today for a skin check status post multiple irrigation and debridements involving right dorsal and volar hand wounds and possible suture removal. The patient was admitted to the hospital after suffering a dog scratch which eventually became more swollen and painful. The patient underwent a CT scan of the right hand which demonstrated a abscess formation along the volar aspect of the hand initially. She underwent 1 irrigation debridement however the patient continued to have swelling and pain prompting a repeat CT scan which demonstrated abscess formation along the dorsal aspect of the hand. The patient underwent a second irrigation debridement and a final third procedure performed by Wanda. Joice Smith on 04/04/2023. The patient was evaluated last week where skin inspection demonstrated excellent granulation without any significant erythema or purulent drainage. The patient then had a follow-up with infectious disease where repeat CRP came back at a normal level. The patient does still have a oral prescription for linezolid which she is scheduled to take until end this week. She denies any trauma or injury affecting the right hand since her last evaluation. She does report an occasional burning discomfort in the index finger. She is still taking occasional oxycodone. She reports a 0 out of 10 pain score at today's visit. Stitches  remove- and to finish antibiotics and refer to OT. ON 07/05/2023 - Pt had by Wanda Smith   Attempted closed manipulation under anesthesia,  then had open capsulectomies with release of radial and ulnar collateral ligaments of both index and long finger MCP joints.- refer to OT to start ROM , splinting and tx 3 days later    PRECAUTIONS: None     WEIGHT BEARING RESTRICTIONS: No  PAIN:  Are you having pain? 3/10 at the most   FALLS: Has patient fallen in last 6 months? No  LIVING ENVIRONMENT: Lives with: lives with their family and lives with their spouse   PLOF: Work on [employer], gym 3 x wk , do own cooking and housework  PATIENT GOALS: Get my motion and strength back in the right hand so that I can carry and grip and hold objects  NEXT MD VISIT: 11th Nov OBJECTIVE:   HAND DOMINANCE: Right   UPPER EXTREMITY ROM:     Active ROM Right eval Left eval R  04/29/23 05/03/23 05/16/23 R 9/16/24R 06/02/23 R R 07/08/23  Shoulder flexion  Shoulder abduction          Shoulder adduction          Shoulder extension          Shoulder internal rotation          Shoulder external rotation          Elbow flexion          Elbow extension          Wrist flexion 35 110 50 50 63 72 80 60  Wrist extension 48 85 60 55 65 70 70 50  Wrist ulnar deviation 24      35 30  Wrist radial deviation 20      25 20   Wrist pronation          Wrist supination          (Blank rows = not tested)  Active ROM Right eval Left eval R   04/29/23 R  05/12/23 R  05/16/23 R 05/23/23 R 06/02/23 R 06/09/23 R 07/08/23 R 07/13/23  In session  Thumb MCP (0-60) 70           Thumb IP (0-80) 35           Thumb Radial abd/add (0-55) 45    50  50 50     Thumb Palmar abd/add (0-45) 45    50  65 55     Thumb Opposition to Small Finger Opposition to 3rd, 4th and 5th     Opposition to all digits   Opposition to base of 5th      Index MCP (0-90) 30   50 60 60( 65 66 70 70 75  Index PIP (0-100) 45( ext -30   55 ( ext  -20 60 ( ext -10) 60 ( blocked 70) 60 66 70 50 70  Index DIP (0-70)       50       Long MCP (0-90)  45   50 60 65 70 70 75 ( 80 in session)  Ext -50 flexion65 80  Long PIP (0-100)  90( ext -20   95 ( ext -15 95 ( ext -5)  100 100 100 90 80  Long DIP (0-70)              Ring MCP (0-90)  45   60 70 75 75 80 80( 90 in session Ext -45 flexion 70 90  Ring PIP (0-100)  100 (ext -10   100( ext 0  100 - full ext  100 100 100 100 95  Ring DIP (0-70)              Little MCP (0-90)  75   75 80  80 85 85( 90 in session) 65 90  Little PIP (0-100)  90 (ext 0   100 ( ext 0 100- full ext   100 100 100 10 95  Little DIP (0-70)              (Blank rows = not tested) In session  AROM increase to 80 degrees at [facility]  and PIP's 100 4th and 5th -   05/23/23 GRIP R 14 lbs L 55 lbs, lat pinch R 4 lbs L 14 lbs and 3 point pinch R 5 lbs and L 10 lbs   05/30/23: Lat grip R 7 lbs , 14 L - 3 point R 6lbs , L 10 lbs   06/07/23 GRIP R 15 lbs L 55 lbs, lat pinch  R 7 lbs L 14 lbs and 3 point pinch R 8 lbs and L 10 lbs  COORDINATION: Decreasing significantly because of stiffness and scar tissue in the second digit  EDEMA: R hand   COGNITION: Overall cognitive status: Within functional limits for tasks assessed    OBSERVATIONS:  Patient arrived with soft cast on.  Removed easily with no issues.  Xeroform on and applied 2 x 2 gauze under Tubigrip D over hand   TODAY'S TREATMENT:                                                                                                                              DATE:  07/13/23 Verbal order from Wanda. Joice Smith 07/08/23 - 8:33 -done a dorsal capsulectomy with release of radial and ulnar collateral ligaments to the right second and third metacarpal.  Did not had any tendon involvement.  Can continue with therapy.  Would need some type of static or dynamic splint.  Pt show no drainage- applied clean stockinet over hand while exercising Contrast done 8 min - decrease edema and stiffness  prior to ROM Patient with increased swelling - less brusing Moldskin inside dorsal hand-based custom MC block splint for cushioning over dorsal hand where stitches is - for cushioning -keeping metacarpals about 80 degrees of flexion with PIP extension. Patient to wear between exercises as well as nighttime. Reviewed exercises: 5 times a day contrast followed by abduction and adduction of digits-- doing well Metacarpal flexion with IP extension - to about 80  degrees in session - PROM 90 PROM to PIP/DIP flexion- - hold 15 sec x 10 Metacarpal flexion at 80 with PIP flexion- cont at home coban flexion wrap today 2 x 1 min  With PROM - keeping MC at 90 - initially tight but increase in session in composite flexion Composite fist in MC splint to 2cm  blue foam block Place and hold- able to touch palm with 3rd thru 5th   Squeezing 3rd thru 5th into 2 cm -and wide part 4 cm  with 2nd - touching 2 cm with 2nd  10-12 reps keeping pain under 2/10 In session  AROM increase to 80 degrees at MC's  and PIP's 100 4th and 5th - 2nd 75, 3rd 90  silicone sleeves to decrease edema in second and third digits in combination with Tubigrip Patient educated on use of splint as well as home exercises and precautions.    PATIENT EDUCATION: Education details: Findings of evaluation and home program Person educated: Patient Education method: Explanation, Demonstration, Tactile cues, Verbal cues, and Handouts Education comprehension: verbalized understanding, returned demonstration, verbal cues required, tactile cues required, and needs further education   GOALS: Goals reviewed with patient? Yes  SHORT TERM GOALS: Target date: 4 wks  Patient to be independent in home program to decrease scar tissue and edema - increased extension and flexion of digits as well as wrist Baseline: Severe stiffness in digits, i 3 days  ago surgery stitches in place  this date- Goal status: INITIAL   LONG TERM GOALS: Target  date: 8 wks Flexion of right digits improved for patient to touch palm to initiate use in ADLs with no increase symptoms Baseline: Surgery done 3 days ago -capsulectomy -MC flexion 65 to 70 degrees coming in.  PIP 5200 degrees.   Goal status: INITIAL   2.  Patient increase extension of right digits 2nd thru 4th to be able to don gloves, wash face and reach aroundcylinder objects without issues Baseline: 3 days ago patient had capsulectomy.  Extension of metacarpals -50-second and -40 degrees at third Goal status: INITIAL   3.  Scar tissue improved for patient to be able to make composite fist as well as increase extension to don gloves and put hand in pocket Baseline: 3 days ago had surgery.  Stitches in place.  Decreased flexion and extension with increased edema  goal status: INITIAL   4.  Right grip and prehension strength increased to more than 75% compared to the left for patient to hold weight to work out and cook Baseline: 3 days postop from capsulectomy with ligament release on second and third metacarpal no strength  tested Goal status: INITIAL   5.  R wrist AROM increase to WNL to push and pull door open without increase symptoms  Baseline:  wrist flxion 60, ext 50 Goal status: INITIAL  ASSESSMENT:  CLINICAL IMPRESSION: Patient presents at occupational therapy evaluation 3 days postop capsulectomy of right second and third metacarpal with release of collateral ligaments.  Patient arrived with soft cast in place.  Patient had earlier this year multiple irrigation and debridements involving right dorsal and volar hand wounds -with abscess and infection.  Patient was seen by therapy in September.  But limited by scar tissue and joint capsule tightness.  Patient arrived 07/08/23 with soft cast in place.  Pt arrive with hand base hand-based MC flexion splint.  Splint done at about 80 degrees of MC flexion and to be wore at nighttime as well as during the day in between exercises. Tolerate  HEP and ROM with contrast prior well - Pt showing increased MC flexion  and PIP compared to prior to surgery and tolerate home exercises well.  Patient to  decrease edema and keep pain under 2/10 with active and active assisted range of motion.  Plan to adjust [facility] block splint to 90 flexion next session. Patient limited in functional use of right dominant hand.  Patient can benefit from skilled OT services to decrease edema, scar tissue and increase range of motion in flexion and extension of digits as well as wrist, increase strength to return to prior level of function. PERFORMANCE DEFICITS: in functional skills including ADLs, IADLs, coordination, dexterity, edema, ROM, strength, pain, flexibility, and UE functional use,   and psychosocial skills including habits and routines and behaviors.   IMPAIRMENTS: are limiting patient from ADLs, IADLs, rest and sleep, work, play, leisure, and social participation.   COMORBIDITIES: has no other co-morbidities that affects occupational performance. Patient will benefit from skilled OT to address above impairments and improve overall function.  MODIFICATION OR ASSISTANCE TO COMPLETE EVALUATION: No modification of tasks or assist necessary to complete an evaluation.  OT OCCUPATIONAL PROFILE AND HISTORY: Problem focused assessment: Including review of records relating to presenting problem.  CLINICAL DECISION MAKING: LOW - limited treatment options, no task modification necessary  REHAB POTENTIAL: Good  EVALUATION COMPLEXITY: Low  PLAN:  OT FREQUENCY: 3 x wk  initially - decrease depending on progress  OT DURATION: 8 weeks  PLANNED INTERVENTIONS: ADL's therapeutic exercise, therapeutic activity, manual therapy, scar mobilization, passive range of motion, splinting, paraffin, fluidotherapy, contrast bath, patient/family education, and DME and/or AE instructions  CONSULTED AND AGREED WITH PLAN OF CARE: Patient   Oletta Cohn, OTR/L,CLT 07/13/2023,  4:31 PM

## 2023-07-15 ENCOUNTER — Ambulatory Visit: Payer: No Typology Code available for payment source | Admitting: Occupational Therapy

## 2023-07-15 DIAGNOSIS — M6281 Muscle weakness (generalized): Secondary | ICD-10-CM

## 2023-07-15 DIAGNOSIS — L905 Scar conditions and fibrosis of skin: Secondary | ICD-10-CM | POA: Diagnosis not present

## 2023-07-15 DIAGNOSIS — R600 Localized edema: Secondary | ICD-10-CM

## 2023-07-15 DIAGNOSIS — M25641 Stiffness of right hand, not elsewhere classified: Secondary | ICD-10-CM

## 2023-07-15 DIAGNOSIS — M25631 Stiffness of right wrist, not elsewhere classified: Secondary | ICD-10-CM

## 2023-07-15 NOTE — Therapy (Signed)
OUTPATIENT OCCUPATIONAL THERAPY ORTHO  TREATMENT  Patient Name: Wanda Smith MRN: [id number] DOB:1961-04-08, 62 y.o., female   PCP: Wanda Frederick NP REFERRING PROVIDER: Marney Doctor Smith  END OF SESSION:  OT End of Session - 07/15/23 1125     Visit Number 18    Number of Visits 25    Date for OT Re-Evaluation 09/02/23    OT Start Time 1030    OT Stop Time 1116    OT Time Calculation (min) 46 min    Activity Tolerance Patient tolerated treatment well    Behavior During Therapy [facility] for tasks assessed/performed             Past Medical History:  Diagnosis Date   Anemia    Anxiety    Arthritis    knee (R)   Asthma    Carpal tunnel syndrome    Cellulitis of right hand 03/30/2023   Chronic kidney disease, stage 3a (HCC)    Dr Cherylann Ratel   COPD (chronic obstructive pulmonary disease) (HCC)    Depression    Diabetes mellitus without complication (HCC)    Dyspnea    GERD (gastroesophageal reflux disease)    occ   Hyperlipidemia    Hypertension    Hypothyroidism    Increased serum lipids    MRSA infection 03/30/2023   right hand   Osteoporosis    Severe sepsis (HCC) 03/30/2023   Type 2 diabetes mellitus with renal manifestations (HCC)    Wears contact lenses    Past Surgical History:  Procedure Laterality Date   ABDOMINAL HYSTERECTOMY  1998   CARPAL TUNNEL RELEASE Right 07/26/2018   Procedure: CARPAL TUNNEL RELEASE ENDOSCOPIC;  Surgeon: Christena Flake, MD;  Location: [facility];  Service: Orthopedics;  Laterality: Right;  diabetic - oral meds   CHOLECYSTECTOMY  2013   CLOSED REDUCTION METACARPAL WITH PERCUTANEOUS PINNING Right 07/05/2023   Procedure: MANIPULATION UNDER ANESTHESIA OF RIGHT INDEX AND LONG FINGERS WITH POSSIBLE CAPSULAR OF RIGHT INDEX AND LONG MCP JOINTS;  Surgeon: Christena Flake, MD;  Location: [facility];  Service: Orthopedics;  Laterality: Right;   COLONOSCOPY WITH ESOPHAGOGASTRODUODENOSCOPY (EGD)  03/03/2012   COLONOSCOPY WITH  ESOPHAGOGASTRODUODENOSCOPY (EGD)  02/23/2022   COLONOSCOPY WITH ESOPHAGOGASTRODUODENOSCOPY (EGD)  01/16/1991   COLONOSCOPY WITH PROPOFOL N/A 01/13/2018   Procedure: COLONOSCOPY WITH PROPOFOL;  Surgeon: Scot Jun, MD;  Location: [facility];  Service: Endoscopy;  Laterality: N/A;   HERNIA REPAIR  1966   I & D EXTREMITY Right 04/01/2023   Procedure: IRRIGATION AND DEBRIDEMENT EXTREMITY PREVENA PLACEMENT;  Surgeon: Christena Flake, MD;  Location: [facility];  Service: Orthopedics;  Laterality: Right;   I-131 THYROID ABLATION     INCISION AND DRAINAGE ABSCESS Right 03/30/2023   Procedure: INCISION AND DRAINAGE ABSCESS RIGHT HAND;  Surgeon: Christena Flake, MD;  Location: [facility];  Service: Orthopedics;  Laterality: Right;   INCISION AND DRAINAGE OF WOUND Right 04/04/2023   Procedure: IRRIGATION AND DEBRIDEMENT RIGHT HAND DORSAL AND PALMAR INCISIONS WITH DELAYED PRIMARY CLOSURE;  Surgeon: Christena Flake, MD;  Location: [facility];  Service: Orthopedics;  Laterality: Right;   ROTATOR CUFF REPAIR Left 2011   SHOULDER ARTHROSCOPY WITH SUBACROMIAL DECOMPRESSION, ROTATOR CUFF REPAIR AND BICEP TENDON REPAIR Right 10/21/2021   Procedure: SHOULDER ARTHROSCOPY WITH DEBRIDEMENT, DECOMPRESSION, BICEPS TENODESIS.;  Surgeon: Christena Flake, MD;  Location: [facility];  Service: Orthopedics;  Laterality: Right;   TUBAL LIGATION  1993   Patient Active Problem List   Diagnosis Date  Noted   MRSA infection 04/04/2023   Abscess of right hand 03/31/2023   Cellulitis of right hand 03/30/2023   Depression with anxiety 03/30/2023   HLD (hyperlipidemia) 03/30/2023   Type II diabetes mellitus with renal manifestations (HCC) 03/30/2023   Chronic kidney disease, stage 3a (HCC) 03/30/2023   Abnormal LFTs 03/30/2023   Severe sepsis (HCC) 03/30/2023   Hypothyroidism    Hypertension    COPD (chronic obstructive pulmonary disease) (HCC)     ONSET DATE: 03/30/23  REFERRING DIAG:Abscess of R hand -3 irrigation  surgeries/Capsulectomy 2nd and 3rd MC - release of collateral ligaments  THERAPY DIAG:  Scar condition and fibrosis of skin  Stiffness of right hand, not elsewhere classified  Muscle weakness (generalized)  Localized edema  Stiffness of right wrist, not elsewhere classified  Rationale for Evaluation and Treatment: Rehabilitation  SUBJECTIVE:   SUBJECTIVE STATEMENT: I can start feeling the stitches more - tight - getting it out Monday - I can tell already that I can use my index finger more pt accompanied by: self  PERTINENT HISTORY:  Ortho note 04/20/23 Wanda Smith is a 62 y.o. female who presents today for a skin check status post multiple irrigation and debridements involving right dorsal and volar hand wounds and possible suture removal. The patient was admitted to the hospital after suffering a dog scratch which eventually became more swollen and painful. The patient underwent a CT scan of the right hand which demonstrated a abscess formation along the volar aspect of the hand initially. She underwent 1 irrigation debridement however the patient continued to have swelling and pain prompting a repeat CT scan which demonstrated abscess formation along the dorsal aspect of the hand. The patient underwent a second irrigation debridement and a final third procedure performed by Dr. Joice Lofts on 04/04/2023. The patient was evaluated last week where skin inspection demonstrated excellent granulation without any significant erythema or purulent drainage. The patient then had a follow-up with infectious disease where repeat CRP came back at a normal level. The patient does still have a oral prescription for linezolid which she is scheduled to take until end this week. She denies any trauma or injury affecting the right hand since her last evaluation. She does report an occasional burning discomfort in the index finger. She is still taking occasional oxycodone. She reports a 0 out of 10 pain score  at today's visit. Stitches remove- and to finish antibiotics and refer to OT. ON 07/05/2023 - Pt had by Dr Joice Lofts   Attempted closed manipulation under anesthesia,  then had open capsulectomies with release of radial and ulnar collateral ligaments of both index and long finger MCP joints.- refer to OT to start ROM , splinting and tx 3 days later    PRECAUTIONS: None     WEIGHT BEARING RESTRICTIONS: No  PAIN:  Are you having pain? 3/10 at the most   FALLS: Has patient fallen in last 6 months? No  LIVING ENVIRONMENT: Lives with: lives with their family and lives with their spouse   PLOF: Work on [employer], gym 3 x wk , do own cooking and housework  PATIENT GOALS: Get my motion and strength back in the right hand so that I can carry and grip and hold objects  NEXT MD VISIT: 11th Nov OBJECTIVE:   HAND DOMINANCE: Right   UPPER EXTREMITY ROM:     Active ROM Right eval Left eval R  04/29/23 05/03/23 05/16/23 R 9/16/24R 06/02/23 R R 07/08/23  Shoulder flexion  Shoulder abduction          Shoulder adduction          Shoulder extension          Shoulder internal rotation          Shoulder external rotation          Elbow flexion          Elbow extension          Wrist flexion 35 110 50 50 63 72 80 60  Wrist extension 48 85 60 55 65 70 70 50  Wrist ulnar deviation 24      35 30  Wrist radial deviation 20      25 20   Wrist pronation          Wrist supination          (Blank rows = not tested)  Active ROM Right eval Left eval R   04/29/23 R  05/12/23 R  05/16/23 R 05/23/23 R 06/02/23 R 06/09/23 R 07/08/23 R 07/13/23  In session  Thumb MCP (0-60) 70           Thumb IP (0-80) 35           Thumb Radial abd/add (0-55) 45    50  50 50     Thumb Palmar abd/add (0-45) 45    50  65 55     Thumb Opposition to Small Finger Opposition to 3rd, 4th and 5th     Opposition to all digits   Opposition to base of 5th      Index MCP (0-90) 30   50 60 60( 65 66 70 70 75  Index PIP  (0-100) 45( ext -30   55 ( ext -20 60 ( ext -10) 60 ( blocked 70) 60 66 70 50 70  Index DIP (0-70)       50       Long MCP (0-90)  45   50 60 65 70 70 75 ( 80 in session)  Ext -50 flexion65 80  Long PIP (0-100)  90( ext -20   95 ( ext -15 95 ( ext -5)  100 100 100 90 80  Long DIP (0-70)              Ring MCP (0-90)  45   60 70 75 75 80 80( 90 in session Ext -45 flexion 70 90  Ring PIP (0-100)  100 (ext -10   100( ext 0  100 - full ext  100 100 100 100 95  Ring DIP (0-70)              Little MCP (0-90)  75   75 80  80 85 85( 90 in session) 65 90  Little PIP (0-100)  90 (ext 0   100 ( ext 0 100- full ext   100 100 100 10 95  Little DIP (0-70)              (Blank rows = not tested) In session  AROM increase to 80-80 degrees at [facility]  and PIP's -  2nd 65 degrees; 3rd 90 and 4th and 5th 100  05/23/23 GRIP R 14 lbs L 55 lbs, lat pinch R 4 lbs L 14 lbs and 3 point pinch R 5 lbs and L 10 lbs   05/30/23: Lat grip R 7 lbs , 14 L - 3 point R 6lbs , L 10 lbs   06/07/23 GRIP R 15  lbs L 55 lbs, lat pinch R 7 lbs L 14 lbs and 3 point pinch R 8 lbs and L 10 lbs  COORDINATION: Decreasing significantly because of stiffness and scar tissue in the second digit  EDEMA: R hand   COGNITION: Overall cognitive status: Within functional limits for tasks assessed    OBSERVATIONS:  Patient arrived with soft cast on.  Removed easily with no issues.  Xeroform on and applied 2 x 2 gauze under Tubigrip D over hand   TODAY'S TREATMENT:                                                                                                                              DATE:  07/15/23 Verbal order from Dr. Joice Lofts 07/08/23 - 8:33 -done a dorsal capsulectomy with release of radial and ulnar collateral ligaments to the right second and third metacarpal.  Did not had any tendon involvement.  Can continue with therapy.  Would need some type of static or dynamic splint.  Pt show no drainage- applied clean stockinet over hand while  exercising Contrast done 8 min - decrease edema and stiffness prior to ROM Patient  cont to have increase edema - mostly in 2nd and 3rd MC and 3 rd digit  Refabricated this date [facility] block splint to increase MC flexion in splint to 80-90 flexion - wear most of the day and night time to maintain stretch on MC - can do PROM to PIP flexion in splint  Off for HEP about 5 x day  Moldskin inside dorsal hand-based custom MC block splint for cushioning over dorsal hand where stitches is again  5 times a day contrast followed by abduction and adduction of digits-- doing well Metacarpal flexion with IP extension - to about 80  degrees in session - PROM 90 PROM to PIP/DIP flexion- - hold 15 sec x 10 Metacarpal flexion at 80 with PIP flexion- cont at home coban flexion wrap today 2 x 1 min  With PROM - keeping MC at 90 - initially tight but increase in session in composite flexion Composite fist in MC splint to 2cm  blue foam block Place and hold- able to touch palm with 3rd thru 5th   Squeezing 3rd thru 5th into 2 cm -and wide part 4 cm  with 2nd - touching 2 cm with 2nd  10-12 reps keeping pain under 2/10   silicone sleeves to decrease edema in second and third digits in combination with Tubigrip Patient educated on use of splint as well as home exercises and precautions.    PATIENT EDUCATION: Education details: Findings of evaluation and home program Person educated: Patient Education method: Explanation, Demonstration, Tactile cues, Verbal cues, and Handouts Education comprehension: verbalized understanding, returned demonstration, verbal cues required, tactile cues required, and needs further education   GOALS: Goals reviewed with patient? Yes  SHORT TERM GOALS: Target date: 4 wks  Patient to be independent in home program to decrease scar  tissue and edema - increased extension and flexion of digits as well as wrist Baseline: Severe stiffness in digits, i 3 days ago surgery stitches in  place  this date- Goal status: INITIAL   LONG TERM GOALS: Target date: 8 wks Flexion of right digits improved for patient to touch palm to initiate use in ADLs with no increase symptoms Baseline: Surgery done 3 days ago -capsulectomy -MC flexion 65 to 70 degrees coming in.  PIP 5200 degrees.   Goal status: INITIAL   2.  Patient increase extension of right digits 2nd thru 4th to be able to don gloves, wash face and reach aroundcylinder objects without issues Baseline: 3 days ago patient had capsulectomy.  Extension of metacarpals -50-second and -40 degrees at third Goal status: INITIAL   3.  Scar tissue improved for patient to be able to make composite fist as well as increase extension to don gloves and put hand in pocket Baseline: 3 days ago had surgery.  Stitches in place.  Decreased flexion and extension with increased edema  goal status: INITIAL   4.  Right grip and prehension strength increased to more than 75% compared to the left for patient to hold weight to work out and cook Baseline: 3 days postop from capsulectomy with ligament release on second and third metacarpal no strength  tested Goal status: INITIAL   5.  R wrist AROM increase to WNL to push and pull door open without increase symptoms  Baseline:  wrist flxion 60, ext 50 Goal status: INITIAL  ASSESSMENT:  CLINICAL IMPRESSION: Patient presents at occupational therapy evaluation 3 days postop capsulectomy of right second and third metacarpal with release of collateral ligaments.  Patient arrived with soft cast in place.  Patient had earlier this year multiple irrigation and debridements involving right dorsal and volar hand wounds -with abscess and infection.  Patient was seen by therapy in September.  But limited by scar tissue and joint capsule tightness.  Pt arrive with hand base hand-based MC flexion splint- refabricate today to increase MC flexion to 80-90 to wear same most of day and night - off 5 x day for ROM -  flexion and extention - Tolerate HEP and ROM with contrast prior well - Pt showing increased MC flexion  and PIP compared to prior to surgery and tolerate home exercises well.  Patient to  decrease edema and keep pain under 2/10 with A/PROM.  Pt getting stitches out on the 07/18/23- Patient limited in functional use of right dominant hand.  Patient can benefit from skilled OT services to decrease edema, scar tissue and increase range of motion in flexion and extension of digits as well as wrist, increase strength to return to prior level of function. PERFORMANCE DEFICITS: in functional skills including ADLs, IADLs, coordination, dexterity, edema, ROM, strength, pain, flexibility, and UE functional use,   and psychosocial skills including habits and routines and behaviors.   IMPAIRMENTS: are limiting patient from ADLs, IADLs, rest and sleep, work, play, leisure, and social participation.   COMORBIDITIES: has no other co-morbidities that affects occupational performance. Patient will benefit from skilled OT to address above impairments and improve overall function.  MODIFICATION OR ASSISTANCE TO COMPLETE EVALUATION: No modification of tasks or assist necessary to complete an evaluation.  OT OCCUPATIONAL PROFILE AND HISTORY: Problem focused assessment: Including review of records relating to presenting problem.  CLINICAL DECISION MAKING: LOW - limited treatment options, no task modification necessary  REHAB POTENTIAL: Good  EVALUATION COMPLEXITY: Low  PLAN:  OT FREQUENCY: 3 x wk initially - decrease depending on progress  OT DURATION: 8 weeks  PLANNED INTERVENTIONS: ADL's therapeutic exercise, therapeutic activity, manual therapy, scar mobilization, passive range of motion, splinting, paraffin, fluidotherapy, contrast bath, patient/family education, and DME and/or AE instructions  CONSULTED AND AGREED WITH PLAN OF CARE: Patient   Oletta Cohn, OTR/L,CLT 07/15/2023, 11:26 AM

## 2023-07-19 ENCOUNTER — Ambulatory Visit: Payer: No Typology Code available for payment source | Admitting: Occupational Therapy

## 2023-07-19 DIAGNOSIS — M25641 Stiffness of right hand, not elsewhere classified: Secondary | ICD-10-CM

## 2023-07-19 DIAGNOSIS — L905 Scar conditions and fibrosis of skin: Secondary | ICD-10-CM

## 2023-07-19 DIAGNOSIS — M6281 Muscle weakness (generalized): Secondary | ICD-10-CM

## 2023-07-19 DIAGNOSIS — M25631 Stiffness of right wrist, not elsewhere classified: Secondary | ICD-10-CM

## 2023-07-19 DIAGNOSIS — R600 Localized edema: Secondary | ICD-10-CM

## 2023-07-19 NOTE — Therapy (Signed)
OUTPATIENT OCCUPATIONAL THERAPY ORTHO  TREATMENT  Patient Name: Wanda Smith MRN: [id number] DOB:[birth year], 62 y.o., female   PCP: Merlinda Frederick NP REFERRING PROVIDER: Marney Doctor PA  END OF SESSION:  OT End of Session - 07/19/23 0834     Visit Number 19    Number of Visits 25    Date for OT Re-Evaluation 09/02/23    OT Start Time 0818    Activity Tolerance Patient tolerated treatment well    Behavior During Therapy [facility] for tasks assessed/performed             Past Medical History:  Diagnosis Date   Anemia    Anxiety    Arthritis    knee (R)   Asthma    Carpal tunnel syndrome    Cellulitis of right hand 03/30/2023   Chronic kidney disease, stage 3a (HCC)    Dr Cherylann Ratel   COPD (chronic obstructive pulmonary disease) (HCC)    Depression    Diabetes mellitus without complication (HCC)    Dyspnea    GERD (gastroesophageal reflux disease)    occ   Hyperlipidemia    Hypertension    Hypothyroidism    Increased serum lipids    MRSA infection 03/30/2023   right hand   Osteoporosis    Severe sepsis (HCC) 03/30/2023   Type 2 diabetes mellitus with renal manifestations (HCC)    Wears contact lenses    Past Surgical History:  Procedure Laterality Date   ABDOMINAL HYSTERECTOMY  1998   CARPAL TUNNEL RELEASE Right 07/26/2018   Procedure: CARPAL TUNNEL RELEASE ENDOSCOPIC;  Surgeon: Christena Flake, MD;  Location: [facility];  Service: Orthopedics;  Laterality: Right;  diabetic - oral meds   CHOLECYSTECTOMY  2013   CLOSED REDUCTION METACARPAL WITH PERCUTANEOUS PINNING Right 07/05/2023   Procedure: MANIPULATION UNDER ANESTHESIA OF RIGHT INDEX AND LONG FINGERS WITH POSSIBLE CAPSULAR OF RIGHT INDEX AND LONG MCP JOINTS;  Surgeon: Christena Flake, MD;  Location: [facility];  Service: Orthopedics;  Laterality: Right;   COLONOSCOPY WITH ESOPHAGOGASTRODUODENOSCOPY (EGD)  03/03/2012   COLONOSCOPY WITH ESOPHAGOGASTRODUODENOSCOPY (EGD)  02/23/2022   COLONOSCOPY WITH  ESOPHAGOGASTRODUODENOSCOPY (EGD)  01/16/1991   COLONOSCOPY WITH PROPOFOL N/A 01/13/2018   Procedure: COLONOSCOPY WITH PROPOFOL;  Surgeon: Scot Jun, MD;  Location: [facility];  Service: Endoscopy;  Laterality: N/A;   HERNIA REPAIR  1966   I & D EXTREMITY Right 04/01/2023   Procedure: IRRIGATION AND DEBRIDEMENT EXTREMITY PREVENA PLACEMENT;  Surgeon: Christena Flake, MD;  Location: [facility];  Service: Orthopedics;  Laterality: Right;   I-131 THYROID ABLATION     INCISION AND DRAINAGE ABSCESS Right 03/30/2023   Procedure: INCISION AND DRAINAGE ABSCESS RIGHT HAND;  Surgeon: Christena Flake, MD;  Location: [facility];  Service: Orthopedics;  Laterality: Right;   INCISION AND DRAINAGE OF WOUND Right 04/04/2023   Procedure: IRRIGATION AND DEBRIDEMENT RIGHT HAND DORSAL AND PALMAR INCISIONS WITH DELAYED PRIMARY CLOSURE;  Surgeon: Christena Flake, MD;  Location: [facility];  Service: Orthopedics;  Laterality: Right;   ROTATOR CUFF REPAIR Left 2011   SHOULDER ARTHROSCOPY WITH SUBACROMIAL DECOMPRESSION, ROTATOR CUFF REPAIR AND BICEP TENDON REPAIR Right 10/21/2021   Procedure: SHOULDER ARTHROSCOPY WITH DEBRIDEMENT, DECOMPRESSION, BICEPS TENODESIS.;  Surgeon: Christena Flake, MD;  Location: [facility];  Service: Orthopedics;  Laterality: Right;   TUBAL LIGATION  1993   Patient Active Problem List   Diagnosis Date Noted   MRSA infection 04/04/2023   Abscess of right hand 03/31/2023   Cellulitis  of right hand 03/30/2023   Depression with anxiety 03/30/2023   HLD (hyperlipidemia) 03/30/2023   Type II diabetes mellitus with renal manifestations (HCC) 03/30/2023   Chronic kidney disease, stage 3a (HCC) 03/30/2023   Abnormal LFTs 03/30/2023   Severe sepsis (HCC) 03/30/2023   Hypothyroidism    Hypertension    COPD (chronic obstructive pulmonary disease) (HCC)     ONSET DATE: 03/30/23  REFERRING DIAG:Abscess of R hand -3 irrigation surgeries/Capsulectomy 2nd and 3rd MC - release of collateral  ligaments  THERAPY DIAG:  Scar condition and fibrosis of skin  Stiffness of right hand, not elsewhere classified  Muscle weakness (generalized)  Localized edema  Stiffness of right wrist, not elsewhere classified  Rationale for Evaluation and Treatment: Rehabilitation  SUBJECTIVE:   SUBJECTIVE STATEMENT: Got stitches out - were very tight- I can see progress in my motion just stiff in the am -  pt accompanied by: self  PERTINENT HISTORY:  Ortho note 04/20/23 Wanda Smith is a 62 y.o. female who presents today for a skin check status post multiple irrigation and debridements involving right dorsal and volar hand wounds and possible suture removal. The patient was admitted to the hospital after suffering a dog scratch which eventually became more swollen and painful. The patient underwent a CT scan of the right hand which demonstrated a abscess formation along the volar aspect of the hand initially. She underwent 1 irrigation debridement however the patient continued to have swelling and pain prompting a repeat CT scan which demonstrated abscess formation along the dorsal aspect of the hand. The patient underwent a second irrigation debridement and a final third procedure performed by Dr. Joice Lofts on 04/04/2023. The patient was evaluated last week where skin inspection demonstrated excellent granulation without any significant erythema or purulent drainage. The patient then had a follow-up with infectious disease where repeat CRP came back at a normal level. The patient does still have a oral prescription for linezolid which she is scheduled to take until end this week. She denies any trauma or injury affecting the right hand since her last evaluation. She does report an occasional burning discomfort in the index finger. She is still taking occasional oxycodone. She reports a 0 out of 10 pain score at today's visit. Stitches remove- and to finish antibiotics and refer to OT. ON 07/05/2023 - Pt  had by Dr Joice Lofts   Attempted closed manipulation under anesthesia,  then had open capsulectomies with release of radial and ulnar collateral ligaments of both index and long finger MCP joints.- refer to OT to start ROM , splinting and tx 3 days later    PRECAUTIONS: None     WEIGHT BEARING RESTRICTIONS: No  PAIN:  Are you having pain? 3/10 at the most   FALLS: Has patient fallen in last 6 months? No  LIVING ENVIRONMENT: Lives with: lives with their family and lives with their spouse   PLOF: Work on [employer], gym 3 x wk , do own cooking and housework  PATIENT GOALS: Get my motion and strength back in the right hand so that I can carry and grip and hold objects  NEXT MD VISIT: 11th Nov OBJECTIVE:   HAND DOMINANCE: Right   UPPER EXTREMITY ROM:     Active ROM Right eval Left eval R  04/29/23 05/03/23 05/16/23 R 9/16/24R 06/02/23 R R 07/08/23 R 07/18/23  Shoulder flexion           Shoulder abduction  Shoulder adduction           Shoulder extension           Shoulder internal rotation           Shoulder external rotation           Elbow flexion           Elbow extension           Wrist flexion 35 110 50 50 63 72 80 60 76  Wrist extension 48 85 60 55 65 70 70 50 67  Wrist ulnar deviation 24      35 30   Wrist radial deviation 20      25 20    Wrist pronation           Wrist supination           (Blank rows = not tested)  Active ROM Right eval Left eval R   04/29/23 R  05/12/23 R  05/16/23 R 05/23/23 R 06/02/23 R 06/09/23 R 07/08/23 R 07/13/23  In session  Thumb MCP (0-60) 70           Thumb IP (0-80) 35           Thumb Radial abd/add (0-55) 45    50  50 50     Thumb Palmar abd/add (0-45) 45    50  65 55     Thumb Opposition to Small Finger Opposition to 3rd, 4th and 5th     Opposition to all digits   Opposition to base of 5th      Index MCP (0-90) 30   50 60 60( 65 66 70 70 75  Index PIP (0-100) 45( ext -30   55 ( ext -20 60 ( ext -10) 60 ( blocked 70) 60 66  70 50 70  Index DIP (0-70)       50       Long MCP (0-90)  45   50 60 65 70 70 75 ( 80 in session)  Ext -50 flexion65 80  Long PIP (0-100)  90( ext -20   95 ( ext -15 95 ( ext -5)  100 100 100 90 80  Long DIP (0-70)              Ring MCP (0-90)  45   60 70 75 75 80 80( 90 in session Ext -45 flexion 70 90  Ring PIP (0-100)  100 (ext -10   100( ext 0  100 - full ext  100 100 100 100 95  Ring DIP (0-70)              Little MCP (0-90)  75   75 80  80 85 85( 90 in session) 65 90  Little PIP (0-100)  90 (ext 0   100 ( ext 0 100- full ext   100 100 100 10 95  Little DIP (0-70)              (Blank rows = not tested) In session  AROM increase to 80-80 degrees at [facility]  and PIP's -  2nd 65 degrees; 3rd 90 and 4th and 5th 100  05/23/23 GRIP R 14 lbs L 55 lbs, lat pinch R 4 lbs L 14 lbs and 3 point pinch R 5 lbs and L 10 lbs   05/30/23: Lat grip R 7 lbs , 14 L - 3 point R 6lbs , L 10 lbs   06/07/23 GRIP R  15 lbs L 55 lbs, lat pinch R 7 lbs L 14 lbs and 3 point pinch R 8 lbs and L 10 lbs  COORDINATION: Decreasing significantly because of stiffness and scar tissue in the second digit  EDEMA: R hand   COGNITION: Overall cognitive status: Within functional limits for tasks assessed    OBSERVATIONS:  Patient arrived with soft cast on.  Removed easily with no issues.  Xeroform on and applied 2 x 2 gauze under Tubigrip D over hand   TODAY'S TREATMENT:                                                                                                                              DATE:  07/18/23 Verbal order from Dr. Joice Lofts 07/08/23 - 8:33 -done a dorsal capsulectomy with release of radial and ulnar collateral ligaments to the right second and third metacarpal.  Did not had any tendon involvement.  Can continue with therapy.  Would need some type of static or dynamic splint.  Stitches came out yesterday -doing well - no issues Contrast done 8 min - decrease edema and stiffness prior to ROM at 2nd and 3rd MC   Coban wrap flexion to 2nd and 3rd - 3rd rotation   Cont MC block splint to increase MC flexion in splint to 80-90 flexion - wear most of the day and night time to maintain stretch on MC - can do PROM to PIP flexion in splint  Off for HEP about 5 x day  2 layers Moldskin inside dorsal hand-based custom MC block splint for cushioning over dorsal hand where stitches is again  3-5 times a day contrast followed by abduction and adduction of digits-- doing well Metacarpal flexion with IP extension - to about 80  degrees in session - PROM 90 PROM to PIP/DIP flexion- - hold 15 sec x 10 With PROM - keeping MC at 90 - initially tight but increase in session in composite flexion Composite fist in MC splint to 2cm  blue foam block Place and hold- able to touch palm with 3rd thru 5th  Today hold off on stretch and PROM aggressively to 2nd [facility] - focus on PIP/DIP flexion of 2nd   Squeezing 3rd thru 5th into 2 cm -and wide part 4 cm  with 2nd - touching 2 cm with 2nd  10-12 reps keeping pain under 2/10   silicone sleeves to decrease edema in second and third digits in combination with Tubigrip Patient educated on use of splint as well as home exercises and precautions.    PATIENT EDUCATION: Education details: Findings of evaluation and home program Person educated: Patient Education method: Explanation, Demonstration, Tactile cues, Verbal cues, and Handouts Education comprehension: verbalized understanding, returned demonstration, verbal cues required, tactile cues required, and needs further education   GOALS: Goals reviewed with patient? Yes  SHORT TERM GOALS: Target date: 4 wks  Patient to be independent in home program to decrease scar tissue and  edema - increased extension and flexion of digits as well as wrist Baseline: Severe stiffness in digits, i 3 days ago surgery stitches in place  this date- Goal status: INITIAL   LONG TERM GOALS: Target date: 8 wks Flexion of right digits  improved for patient to touch palm to initiate use in ADLs with no increase symptoms Baseline: Surgery done 3 days ago -capsulectomy -MC flexion 65 to 70 degrees coming in.  PIP 5200 degrees.   Goal status: INITIAL   2.  Patient increase extension of right digits 2nd thru 4th to be able to don gloves, wash face and reach aroundcylinder objects without issues Baseline: 3 days ago patient had capsulectomy.  Extension of metacarpals -50-second and -40 degrees at third Goal status: INITIAL   3.  Scar tissue improved for patient to be able to make composite fist as well as increase extension to don gloves and put hand in pocket Baseline: 3 days ago had surgery.  Stitches in place.  Decreased flexion and extension with increased edema  goal status: INITIAL   4.  Right grip and prehension strength increased to more than 75% compared to the left for patient to hold weight to work out and cook Baseline: 3 days postop from capsulectomy with ligament release on second and third metacarpal no strength  tested Goal status: INITIAL   5.  R wrist AROM increase to WNL to push and pull door open without increase symptoms  Baseline:  wrist flxion 60, ext 50 Goal status: INITIAL  ASSESSMENT:  CLINICAL IMPRESSION: Patient presents at occupational therapy evaluation 3 days postop capsulectomy of right second and third metacarpal with release of collateral ligaments.  Patient arrived with soft cast in place.  Patient had earlier this year multiple irrigation and debridements involving right dorsal and volar hand wounds -with abscess and infection.  Patient was seen by therapy in September.  But limited by scar tissue and joint capsule tightness.  Pt arrive with hand base hand-based MC flexion splint- MC flexion to 80-90 to wear same most of day and night - off 3-5 x day for ROM - flexion and extention - Tolerate HEP and ROM with contrast prior well - Pt showing increased MC flexion  and PIP compared to prior to  surgery and tolerate home exercises well.  Patient to  decrease edema and keep pain under 2/10 with A/PROM.  Stitches taken out  yesterday -hold off on PROM or stretch to 2nd [facility] - focus for 48 hrs on PIP/DIP flexion -  Patient limited in functional use of right dominant hand.  Patient can benefit from skilled OT services to decrease edema, scar tissue and increase range of motion in flexion and extension of digits as well as wrist, increase strength to return to prior level of function. PERFORMANCE DEFICITS: in functional skills including ADLs, IADLs, coordination, dexterity, edema, ROM, strength, pain, flexibility, and UE functional use,   and psychosocial skills including habits and routines and behaviors.   IMPAIRMENTS: are limiting patient from ADLs, IADLs, rest and sleep, work, play, leisure, and social participation.   COMORBIDITIES: has no other co-morbidities that affects occupational performance. Patient will benefit from skilled OT to address above impairments and improve overall function.  MODIFICATION OR ASSISTANCE TO COMPLETE EVALUATION: No modification of tasks or assist necessary to complete an evaluation.  OT OCCUPATIONAL PROFILE AND HISTORY: Problem focused assessment: Including review of records relating to presenting problem.  CLINICAL DECISION MAKING: LOW - limited treatment options, no task modification  necessary  REHAB POTENTIAL: Good  EVALUATION COMPLEXITY: Low  PLAN:  OT FREQUENCY: 3 x wk initially - decrease depending on progress  OT DURATION: 8 weeks  PLANNED INTERVENTIONS: ADL's therapeutic exercise, therapeutic activity, manual therapy, scar mobilization, passive range of motion, splinting, paraffin, fluidotherapy, contrast bath, patient/family education, and DME and/or AE instructions  CONSULTED AND AGREED WITH PLAN OF CARE: Patient   Oletta Cohn, OTR/L,CLT 07/19/2023, 8:35 AM

## 2023-07-21 ENCOUNTER — Ambulatory Visit: Payer: No Typology Code available for payment source | Admitting: Occupational Therapy

## 2023-07-21 DIAGNOSIS — M6281 Muscle weakness (generalized): Secondary | ICD-10-CM

## 2023-07-21 DIAGNOSIS — L905 Scar conditions and fibrosis of skin: Secondary | ICD-10-CM | POA: Diagnosis not present

## 2023-07-21 DIAGNOSIS — M25641 Stiffness of right hand, not elsewhere classified: Secondary | ICD-10-CM

## 2023-07-21 DIAGNOSIS — R600 Localized edema: Secondary | ICD-10-CM

## 2023-07-21 NOTE — Therapy (Signed)
OUTPATIENT OCCUPATIONAL THERAPY ORTHO  TREATMENT  Patient Name: Wanda Smith MRN: [id number] DOB:[birth year], 62 y.o., female   PCP: Merlinda Frederick NP REFERRING PROVIDER: Marney Doctor PA  END OF SESSION:  OT End of Session - 07/21/23 0915     Visit Number 20    Number of Visits 25    Date for OT Re-Evaluation 09/02/23    OT Start Time 0902    OT Stop Time 0940    OT Time Calculation (min) 38 min    Activity Tolerance Patient tolerated treatment well    Behavior During Therapy [facility] for tasks assessed/performed             Past Medical History:  Diagnosis Date   Anemia    Anxiety    Arthritis    knee (R)   Asthma    Carpal tunnel syndrome    Cellulitis of right hand 03/30/2023   Chronic kidney disease, stage 3a (HCC)    Dr Cherylann Ratel   COPD (chronic obstructive pulmonary disease) (HCC)    Depression    Diabetes mellitus without complication (HCC)    Dyspnea    GERD (gastroesophageal reflux disease)    occ   Hyperlipidemia    Hypertension    Hypothyroidism    Increased serum lipids    MRSA infection 03/30/2023   right hand   Osteoporosis    Severe sepsis (HCC) 03/30/2023   Type 2 diabetes mellitus with renal manifestations (HCC)    Wears contact lenses    Past Surgical History:  Procedure Laterality Date   ABDOMINAL HYSTERECTOMY  1998   CARPAL TUNNEL RELEASE Right 07/26/2018   Procedure: CARPAL TUNNEL RELEASE ENDOSCOPIC;  Surgeon: Christena Flake, MD;  Location: [facility];  Service: Orthopedics;  Laterality: Right;  diabetic - oral meds   CHOLECYSTECTOMY  2013   CLOSED REDUCTION METACARPAL WITH PERCUTANEOUS PINNING Right 07/05/2023   Procedure: MANIPULATION UNDER ANESTHESIA OF RIGHT INDEX AND LONG FINGERS WITH POSSIBLE CAPSULAR OF RIGHT INDEX AND LONG MCP JOINTS;  Surgeon: Christena Flake, MD;  Location: [facility];  Service: Orthopedics;  Laterality: Right;   COLONOSCOPY WITH ESOPHAGOGASTRODUODENOSCOPY (EGD)  03/03/2012   COLONOSCOPY WITH  ESOPHAGOGASTRODUODENOSCOPY (EGD)  02/23/2022   COLONOSCOPY WITH ESOPHAGOGASTRODUODENOSCOPY (EGD)  01/16/1991   COLONOSCOPY WITH PROPOFOL N/A 01/13/2018   Procedure: COLONOSCOPY WITH PROPOFOL;  Surgeon: Scot Jun, MD;  Location: [facility];  Service: Endoscopy;  Laterality: N/A;   HERNIA REPAIR  1966   I & D EXTREMITY Right 04/01/2023   Procedure: IRRIGATION AND DEBRIDEMENT EXTREMITY PREVENA PLACEMENT;  Surgeon: Christena Flake, MD;  Location: [facility];  Service: Orthopedics;  Laterality: Right;   I-131 THYROID ABLATION     INCISION AND DRAINAGE ABSCESS Right 03/30/2023   Procedure: INCISION AND DRAINAGE ABSCESS RIGHT HAND;  Surgeon: Christena Flake, MD;  Location: [facility];  Service: Orthopedics;  Laterality: Right;   INCISION AND DRAINAGE OF WOUND Right 04/04/2023   Procedure: IRRIGATION AND DEBRIDEMENT RIGHT HAND DORSAL AND PALMAR INCISIONS WITH DELAYED PRIMARY CLOSURE;  Surgeon: Christena Flake, MD;  Location: [facility];  Service: Orthopedics;  Laterality: Right;   ROTATOR CUFF REPAIR Left 2011   SHOULDER ARTHROSCOPY WITH SUBACROMIAL DECOMPRESSION, ROTATOR CUFF REPAIR AND BICEP TENDON REPAIR Right 10/21/2021   Procedure: SHOULDER ARTHROSCOPY WITH DEBRIDEMENT, DECOMPRESSION, BICEPS TENODESIS.;  Surgeon: Christena Flake, MD;  Location: [facility];  Service: Orthopedics;  Laterality: Right;   TUBAL LIGATION  1993   Patient Active Problem List   Diagnosis Date  Noted   MRSA infection 04/04/2023   Abscess of right hand 03/31/2023   Cellulitis of right hand 03/30/2023   Depression with anxiety 03/30/2023   HLD (hyperlipidemia) 03/30/2023   Type II diabetes mellitus with renal manifestations (HCC) 03/30/2023   Chronic kidney disease, stage 3a (HCC) 03/30/2023   Abnormal LFTs 03/30/2023   Severe sepsis (HCC) 03/30/2023   Hypothyroidism    Hypertension    COPD (chronic obstructive pulmonary disease) (HCC)     ONSET DATE: 03/30/23  REFERRING DIAG:Abscess of R hand -3 irrigation  surgeries/Capsulectomy 2nd and 3rd MC - release of collateral ligaments  THERAPY DIAG:  Scar condition and fibrosis of skin  Muscle weakness (generalized)  Localized edema  Stiffness of right hand, not elsewhere classified  Rationale for Evaluation and Treatment: Rehabilitation  SUBJECTIVE:   SUBJECTIVE STATEMENT: Still little tender where they took out my stitches- they were tight -but have been working those middle joint index and middle- better better - I can do oval now with 2nd and 3rd  pt accompanied by: self  PERTINENT HISTORY:  Ortho note 04/20/23 Wanda Smith is a 62 y.o. female who presents today for a skin check status post multiple irrigation and debridements involving right dorsal and volar hand wounds and possible suture removal. The patient was admitted to the hospital after suffering a dog scratch which eventually became more swollen and painful. The patient underwent a CT scan of the right hand which demonstrated a abscess formation along the volar aspect of the hand initially. She underwent 1 irrigation debridement however the patient continued to have swelling and pain prompting a repeat CT scan which demonstrated abscess formation along the dorsal aspect of the hand. The patient underwent a second irrigation debridement and a final third procedure performed by Dr. Joice Lofts on 04/04/2023. The patient was evaluated last week where skin inspection demonstrated excellent granulation without any significant erythema or purulent drainage. The patient then had a follow-up with infectious disease where repeat CRP came back at a normal level. The patient does still have a oral prescription for linezolid which she is scheduled to take until end this week. She denies any trauma or injury affecting the right hand since her last evaluation. She does report an occasional burning discomfort in the index finger. She is still taking occasional oxycodone. She reports a 0 out of 10 pain score  at today's visit. Stitches remove- and to finish antibiotics and refer to OT. ON 07/05/2023 - Pt had by Dr Joice Lofts   Attempted closed manipulation under anesthesia,  then had open capsulectomies with release of radial and ulnar collateral ligaments of both index and long finger MCP joints.- refer to OT to start ROM , splinting and tx 3 days later    PRECAUTIONS: None     WEIGHT BEARING RESTRICTIONS: No  PAIN:  Are you having pain? 3/10 at the most   FALLS: Has patient fallen in last 6 months? No  LIVING ENVIRONMENT: Lives with: lives with their family and lives with their spouse   PLOF: Work on [employer], gym 3 x wk , do own cooking and housework  PATIENT GOALS: Get my motion and strength back in the right hand so that I can carry and grip and hold objects  NEXT MD VISIT: 11th Nov OBJECTIVE:   HAND DOMINANCE: Right   UPPER EXTREMITY ROM:     Active ROM Right eval Left eval R  04/29/23 05/03/23 05/16/23 R 9/16/24R 06/02/23 R R 07/08/23 R 07/18/23  Shoulder  flexion           Shoulder abduction           Shoulder adduction           Shoulder extension           Shoulder internal rotation           Shoulder external rotation           Elbow flexion           Elbow extension           Wrist flexion 35 110 50 50 63 72 80 60 76  Wrist extension 48 85 60 55 65 70 70 50 67  Wrist ulnar deviation 24      35 30   Wrist radial deviation 20      25 20    Wrist pronation           Wrist supination           (Blank rows = not tested)  Active ROM Right eval Left eval R   04/29/23 R  05/12/23 R  05/16/23 R 05/23/23 R 06/02/23 R 06/09/23 R 07/08/23 R 07/13/23  In session  Thumb MCP (0-60) 70           Thumb IP (0-80) 35           Thumb Radial abd/add (0-55) 45    50  50 50     Thumb Palmar abd/add (0-45) 45    50  65 55     Thumb Opposition to Small Finger Opposition to 3rd, 4th and 5th     Opposition to all digits   Opposition to base of 5th      Index MCP (0-90) 30   50 60 60( 65  66 70 70 75  Index PIP (0-100) 45( ext -30   55 ( ext -20 60 ( ext -10) 60 ( blocked 70) 60 66 70 50 70  Index DIP (0-70)       50       Long MCP (0-90)  45   50 60 65 70 70 75 ( 80 in session)  Ext -50 flexion65 80  Long PIP (0-100)  90( ext -20   95 ( ext -15 95 ( ext -5)  100 100 100 90 80  Long DIP (0-70)              Ring MCP (0-90)  45   60 70 75 75 80 80( 90 in session Ext -45 flexion 70 90  Ring PIP (0-100)  100 (ext -10   100( ext 0  100 - full ext  100 100 100 100 95  Ring DIP (0-70)              Little MCP (0-90)  75   75 80  80 85 85( 90 in session) 65 90  Little PIP (0-100)  90 (ext 0   100 ( ext 0 100- full ext   100 100 100 10 95  Little DIP (0-70)              (Blank rows = not tested) In session  AROM increase to 80-80 degrees at [facility]  and PIP's -  2nd 65 degrees; 3rd 90 and 4th and 5th 100  05/23/23 GRIP R 14 lbs L 55 lbs, lat pinch R 4 lbs L 14 lbs and 3 point pinch R 5 lbs and L 10 lbs  05/30/23: Lat grip R 7 lbs , 14 L - 3 point R 6lbs , L 10 lbs   06/07/23 GRIP R 15 lbs L 55 lbs, lat pinch R 7 lbs L 14 lbs and 3 point pinch R 8 lbs and L 10 lbs  COORDINATION: Decreasing significantly because of stiffness and scar tissue in the second digit  EDEMA: R hand   COGNITION: Overall cognitive status: Within functional limits for tasks assessed    OBSERVATIONS:  Patient arrived with soft cast on.  Removed easily with no issues.  Xeroform on and applied 2 x 2 gauze under Tubigrip D over hand   TODAY'S TREATMENT:                                                                                                                              DATE:  07/21/23 Verbal order from Dr. Joice Lofts 07/08/23 - 8:33 -done a dorsal capsulectomy with release of radial and ulnar collateral ligaments to the right second and third metacarpal.  Did not had any tendon involvement.  Can continue with therapy.  Would need some type of static or dynamic splint.  Stitches came out earlier this week  -doing well - no issues Contrast done 8 min - decrease edema and stiffness prior to ROM at 2nd and 3rd MC  Coban wrap flexion to 2nd and 3rd - 3rd rotation   Cont MC block splint to increase MC flexion in splint to 80-90 flexion - wear most of the day and night time to maintain stretch on MC - can do PROM to PIP flexion in splint  Off for HEP about 5 x day  2 layers Moldskin inside dorsal hand-based custom MC block splint for cushioning over dorsal hand where stitches is again  3-5 times a day contrast followed by abduction and adduction of digits-- doing well Metacarpal flexion with IP extension - to about 80  degrees in session - PROM 90 PROM to PIP/DIP flexion- - hold 15 sec x 10 With PROM - keeping MC at 90 - initially tight but increase in session in composite flexion Composite fist in MC splint to 2cm  blue foam block Place and hold- able to touch palm with 3rd thru 5th  Cont this last 2 sessions to hold off on  stretch and PROM aggressively to 2nd [facility] - focus on PIP/DIP flexion of 2nd  and 3rd with great progress  Pt done since last time   Squeezing 3rd thru 5th into 2 cm -and wide part 4 cm  with 2nd - touching 2 cm with 2nd  10-12 reps keeping pain under 2/10   silicone sleeves to decrease edema in second and third digits in combination with Tubigrip Patient educated on use of splint as well as home exercises and precautions.    PATIENT EDUCATION: Education details: Findings of evaluation and home program Person educated: Patient Education method: Explanation, Demonstration, Tactile cues, Verbal cues, and Handouts Education comprehension:  verbalized understanding, returned demonstration, verbal cues required, tactile cues required, and needs further education   GOALS: Goals reviewed with patient? Yes  SHORT TERM GOALS: Target date: 4 wks  Patient to be independent in home program to decrease scar tissue and edema - increased extension and flexion of digits as well as  wrist Baseline: Severe stiffness in digits, i 3 days ago surgery stitches in place  this date- Goal status: INITIAL   LONG TERM GOALS: Target date: 8 wks Flexion of right digits improved for patient to touch palm to initiate use in ADLs with no increase symptoms Baseline: Surgery done 3 days ago -capsulectomy -MC flexion 65 to 70 degrees coming in.  PIP 5200 degrees.   Goal status: INITIAL   2.  Patient increase extension of right digits 2nd thru 4th to be able to don gloves, wash face and reach aroundcylinder objects without issues Baseline: 3 days ago patient had capsulectomy.  Extension of metacarpals -50-second and -40 degrees at third Goal status: INITIAL   3.  Scar tissue improved for patient to be able to make composite fist as well as increase extension to don gloves and put hand in pocket Baseline: 3 days ago had surgery.  Stitches in place.  Decreased flexion and extension with increased edema  goal status: INITIAL   4.  Right grip and prehension strength increased to more than 75% compared to the left for patient to hold weight to work out and cook Baseline: 3 days postop from capsulectomy with ligament release on second and third metacarpal no strength  tested Goal status: INITIAL   5.  R wrist AROM increase to WNL to push and pull door open without increase symptoms  Baseline:  wrist flxion 60, ext 50 Goal status: INITIAL  ASSESSMENT:  CLINICAL IMPRESSION: Patient presents at occupational therapy evaluation 3 days postop capsulectomy of right second and third metacarpal with release of collateral ligaments.  Patient arrived with soft cast in place.  Patient had earlier this year multiple irrigation and debridements involving right dorsal and volar hand wounds -with abscess and infection.  Patient was seen by therapy in September.  But limited by scar tissue and joint capsule tightness.  Pt arrive with hand base hand-based MC flexion splint- MC flexion to 80-90 to wear same  most of day and night - off 3-5 x day for ROM - flexion and extention - Tolerate HEP and ROM with contrast prior well - Pt showing increased MC flexion  and PIP flexion compared to prior to surgery and tolerate home exercises well.  Patient to  decrease edema and keep pain under 2/10 with A/PROM.  Stitches taken out  yesterday -hold off on PROM or stretch to 2nd [facility] - focus for 48 hrs on PIP/DIP flexion - Pt focusing since stitches out earlier this week on PIP and DIP flexion- with great progress-  Patient limited in functional use of right dominant hand.  Patient can benefit from skilled OT services to decrease edema, scar tissue and increase range of motion in flexion and extension of digits as well as wrist, increase strength to return to prior level of function. PERFORMANCE DEFICITS: in functional skills including ADLs, IADLs, coordination, dexterity, edema, ROM, strength, pain, flexibility, and UE functional use,   and psychosocial skills including habits and routines and behaviors.   IMPAIRMENTS: are limiting patient from ADLs, IADLs, rest and sleep, work, play, leisure, and social participation.   COMORBIDITIES: has no other co-morbidities that affects occupational performance. Patient  will benefit from skilled OT to address above impairments and improve overall function.  MODIFICATION OR ASSISTANCE TO COMPLETE EVALUATION: No modification of tasks or assist necessary to complete an evaluation.  OT OCCUPATIONAL PROFILE AND HISTORY: Problem focused assessment: Including review of records relating to presenting problem.  CLINICAL DECISION MAKING: LOW - limited treatment options, no task modification necessary  REHAB POTENTIAL: Good  EVALUATION COMPLEXITY: Low  PLAN:  OT FREQUENCY: 3 x wk initially - decrease depending on progress  OT DURATION: 8 weeks  PLANNED INTERVENTIONS: ADL's therapeutic exercise, therapeutic activity, manual therapy, scar mobilization, passive range of motion,  splinting, paraffin, fluidotherapy, contrast bath, patient/family education, and DME and/or AE instructions  CONSULTED AND AGREED WITH PLAN OF CARE: Patient   Oletta Cohn, OTR/L,CLT 07/21/2023, 9:46 AM

## 2023-07-22 ENCOUNTER — Ambulatory Visit: Payer: No Typology Code available for payment source | Admitting: Occupational Therapy

## 2023-07-22 DIAGNOSIS — M25631 Stiffness of right wrist, not elsewhere classified: Secondary | ICD-10-CM

## 2023-07-22 DIAGNOSIS — L905 Scar conditions and fibrosis of skin: Secondary | ICD-10-CM

## 2023-07-22 DIAGNOSIS — R600 Localized edema: Secondary | ICD-10-CM

## 2023-07-22 DIAGNOSIS — M6281 Muscle weakness (generalized): Secondary | ICD-10-CM

## 2023-07-22 DIAGNOSIS — M25641 Stiffness of right hand, not elsewhere classified: Secondary | ICD-10-CM

## 2023-07-22 NOTE — Therapy (Signed)
OUTPATIENT OCCUPATIONAL THERAPY ORTHO  TREATMENT  Patient Name: Wanda Smith MRN: [id number] DOB:09-25-1960, 62 y.o., female   PCP: Merlinda Frederick NP REFERRING PROVIDER: Marney Doctor PA  END OF SESSION:  OT End of Session - 07/22/23 1134     Visit Number 21    Number of Visits 25    Date for OT Re-Evaluation 09/02/23    OT Start Time 1117    OT Stop Time 1201    OT Time Calculation (min) 44 min    Activity Tolerance Patient tolerated treatment well    Behavior During Therapy [facility] for tasks assessed/performed             Past Medical History:  Diagnosis Date   Anemia    Anxiety    Arthritis    knee (R)   Asthma    Carpal tunnel syndrome    Cellulitis of right hand 03/30/2023   Chronic kidney disease, stage 3a (HCC)    Dr Cherylann Ratel   COPD (chronic obstructive pulmonary disease) (HCC)    Depression    Diabetes mellitus without complication (HCC)    Dyspnea    GERD (gastroesophageal reflux disease)    occ   Hyperlipidemia    Hypertension    Hypothyroidism    Increased serum lipids    MRSA infection 03/30/2023   right hand   Osteoporosis    Severe sepsis (HCC) 03/30/2023   Type 2 diabetes mellitus with renal manifestations (HCC)    Wears contact lenses    Past Surgical History:  Procedure Laterality Date   ABDOMINAL HYSTERECTOMY  1998   CARPAL TUNNEL RELEASE Right 07/26/2018   Procedure: CARPAL TUNNEL RELEASE ENDOSCOPIC;  Surgeon: Christena Flake, MD;  Location: [facility];  Service: Orthopedics;  Laterality: Right;  diabetic - oral meds   CHOLECYSTECTOMY  2013   CLOSED REDUCTION METACARPAL WITH PERCUTANEOUS PINNING Right 07/05/2023   Procedure: MANIPULATION UNDER ANESTHESIA OF RIGHT INDEX AND LONG FINGERS WITH POSSIBLE CAPSULAR OF RIGHT INDEX AND LONG MCP JOINTS;  Surgeon: Christena Flake, MD;  Location: [facility];  Service: Orthopedics;  Laterality: Right;   COLONOSCOPY WITH ESOPHAGOGASTRODUODENOSCOPY (EGD)  03/03/2012   COLONOSCOPY WITH  ESOPHAGOGASTRODUODENOSCOPY (EGD)  02/23/2022   COLONOSCOPY WITH ESOPHAGOGASTRODUODENOSCOPY (EGD)  01/16/1991   COLONOSCOPY WITH PROPOFOL N/A 01/13/2018   Procedure: COLONOSCOPY WITH PROPOFOL;  Surgeon: Scot Jun, MD;  Location: [facility];  Service: Endoscopy;  Laterality: N/A;   HERNIA REPAIR  1966   I & D EXTREMITY Right 04/01/2023   Procedure: IRRIGATION AND DEBRIDEMENT EXTREMITY PREVENA PLACEMENT;  Surgeon: Christena Flake, MD;  Location: [facility];  Service: Orthopedics;  Laterality: Right;   I-131 THYROID ABLATION     INCISION AND DRAINAGE ABSCESS Right 03/30/2023   Procedure: INCISION AND DRAINAGE ABSCESS RIGHT HAND;  Surgeon: Christena Flake, MD;  Location: [facility];  Service: Orthopedics;  Laterality: Right;   INCISION AND DRAINAGE OF WOUND Right 04/04/2023   Procedure: IRRIGATION AND DEBRIDEMENT RIGHT HAND DORSAL AND PALMAR INCISIONS WITH DELAYED PRIMARY CLOSURE;  Surgeon: Christena Flake, MD;  Location: [facility];  Service: Orthopedics;  Laterality: Right;   ROTATOR CUFF REPAIR Left 2011   SHOULDER ARTHROSCOPY WITH SUBACROMIAL DECOMPRESSION, ROTATOR CUFF REPAIR AND BICEP TENDON REPAIR Right 10/21/2021   Procedure: SHOULDER ARTHROSCOPY WITH DEBRIDEMENT, DECOMPRESSION, BICEPS TENODESIS.;  Surgeon: Christena Flake, MD;  Location: [facility];  Service: Orthopedics;  Laterality: Right;   TUBAL LIGATION  1993   Patient Active Problem List   Diagnosis Date  Noted   MRSA infection 04/04/2023   Abscess of right hand 03/31/2023   Cellulitis of right hand 03/30/2023   Depression with anxiety 03/30/2023   HLD (hyperlipidemia) 03/30/2023   Type II diabetes mellitus with renal manifestations (HCC) 03/30/2023   Chronic kidney disease, stage 3a (HCC) 03/30/2023   Abnormal LFTs 03/30/2023   Severe sepsis (HCC) 03/30/2023   Hypothyroidism    Hypertension    COPD (chronic obstructive pulmonary disease) (HCC)     ONSET DATE: 03/30/23  REFERRING DIAG:Abscess of R hand -3 irrigation  surgeries/Capsulectomy 2nd and 3rd MC - release of collateral ligaments  THERAPY DIAG:  Scar condition and fibrosis of skin  Muscle weakness (generalized)  Localized edema  Stiffness of right hand, not elsewhere classified  Stiffness of right wrist, not elsewhere classified  Rationale for Evaluation and Treatment: Rehabilitation  SUBJECTIVE:   SUBJECTIVE STATEMENT: Still little tender where they took out my stitches- they were tight -but have been working those middle joint index and middle- better better - I can do oval now with 2nd and 3rd  pt accompanied by: self  PERTINENT HISTORY:  Ortho note 04/20/23 Wanda Smith is a 61 y.o. female who presents today for a skin check status post multiple irrigation and debridements involving right dorsal and volar hand wounds and possible suture removal. The patient was admitted to the hospital after suffering a dog scratch which eventually became more swollen and painful. The patient underwent a CT scan of the right hand which demonstrated a abscess formation along the volar aspect of the hand initially. She underwent 1 irrigation debridement however the patient continued to have swelling and pain prompting a repeat CT scan which demonstrated abscess formation along the dorsal aspect of the hand. The patient underwent a second irrigation debridement and a final third procedure performed by Dr. Joice Lofts on 04/04/2023. The patient was evaluated last week where skin inspection demonstrated excellent granulation without any significant erythema or purulent drainage. The patient then had a follow-up with infectious disease where repeat CRP came back at a normal level. The patient does still have a oral prescription for linezolid which she is scheduled to take until end this week. She denies any trauma or injury affecting the right hand since her last evaluation. She does report an occasional burning discomfort in the index finger. She is still taking  occasional oxycodone. She reports a 0 out of 10 pain score at today's visit. Stitches remove- and to finish antibiotics and refer to OT. ON 07/05/2023 - Pt had by Dr Joice Lofts   Attempted closed manipulation under anesthesia,  then had open capsulectomies with release of radial and ulnar collateral ligaments of both index and long finger MCP joints.- refer to OT to start ROM , splinting and tx 3 days later    PRECAUTIONS: None     WEIGHT BEARING RESTRICTIONS: No  PAIN:  Are you having pain? 3/10 at the most   FALLS: Has patient fallen in last 6 months? No  LIVING ENVIRONMENT: Lives with: lives with their family and lives with their spouse   PLOF: Work on [employer], gym 3 x wk , do own cooking and housework  PATIENT GOALS: Get my motion and strength back in the right hand so that I can carry and grip and hold objects  NEXT MD VISIT: 11th Nov OBJECTIVE:   HAND DOMINANCE: Right   UPPER EXTREMITY ROM:     Active ROM Right eval Left eval R  04/29/23 05/03/23 05/16/23 R 9/16/24R  06/02/23 R R 07/08/23 R 07/18/23  Shoulder flexion           Shoulder abduction           Shoulder adduction           Shoulder extension           Shoulder internal rotation           Shoulder external rotation           Elbow flexion           Elbow extension           Wrist flexion 35 110 50 50 63 72 80 60 76  Wrist extension 48 85 60 55 65 70 70 50 67  Wrist ulnar deviation 24      35 30   Wrist radial deviation 20      25 20    Wrist pronation           Wrist supination           (Blank rows = not tested)  Active ROM Right eval Left eval R   04/29/23 R  05/12/23 R  05/16/23 R 05/23/23 R 06/02/23 R 06/09/23 R 07/08/23 R 07/13/23  In session R 07/22/23  Thumb MCP (0-60) 70            Thumb IP (0-80) 35            Thumb Radial abd/add (0-55) 45    50  50 50      Thumb Palmar abd/add (0-45) 45    50  65 55      Thumb Opposition to Small Finger Opposition to 3rd, 4th and 5th     Opposition to all  digits   Opposition to base of 5th       Index MCP (0-90) 30   50 60 60( 65 66 70 70 75 75  Index PIP (0-100) 45( ext -30   55 ( ext -20 60 ( ext -10) 60 ( blocked 70) 60 66 70 50 70 70  Index DIP (0-70)       50        Long MCP (0-90)  45   50 60 65 70 70 75 ( 80 in session)  Ext -50 flexion65 80 75  Long PIP (0-100)  90( ext -20   95 ( ext -15 95 ( ext -5)  100 100 100 90 80 95  Long DIP (0-70)               Ring MCP (0-90)  45   60 70 75 75 80 80( 90 in session Ext -45 flexion 70 90 90  Ring PIP (0-100)  100 (ext -10   100( ext 0  100 - full ext  100 100 100 100 95 95  Ring DIP (0-70)               Little MCP (0-90)  75   75 80  80 85 85( 90 in session) 65 90 85  Little PIP (0-100)  90 (ext 0   100 ( ext 0 100- full ext   100 100 100 10 95 100  Little DIP (0-70)               (Blank rows = not tested) I  05/23/23 GRIP R 14 lbs L 55 lbs, lat pinch R 4 lbs L 14 lbs and 3 point pinch R 5 lbs and L 10  lbs   05/30/23: Lat grip R 7 lbs , 14 L - 3 point R 6lbs , L 10 lbs   06/07/23 GRIP R 15 lbs L 55 lbs, lat pinch R 7 lbs L 14 lbs and 3 point pinch R 8 lbs and L 10 lbs 06/21/23 GRIP R 12 lbs L 48 lbs, lat pinch R 5 lbs L 14 lbs and 3 point pinch R 6 lbs and L 10 lbs  COORDINATION: Decreasing significantly because of stiffness and scar tissue in the second digit  EDEMA: R hand   COGNITION: Overall cognitive status: Within functional limits for tasks assessed    OBSERVATIONS:  Patient arrived with soft cast on.  Removed easily with no issues.  Xeroform on and applied 2 x 2 gauze under Tubigrip D over hand   TODAY'S TREATMENT:                                                                                                                              DATE:  07/22/23 Verbal order from Dr. Joice Lofts 07/08/23 - 8:33 -done a dorsal capsulectomy with release of radial and ulnar collateral ligaments to the right second and third metacarpal.  Did not had any tendon involvement.  Can continue with  therapy.  Would need some type of static or dynamic splint.  Stitches came out earlier this week -doing well - no issues FLuido done  8 min - decrease  stiffness for wrist and digits AROM in all planes    Cont MC block splint to increase MC flexion in splint to 80-90 flexion - wear most of the day and night time to maintain stretch on MC - can do PROM to PIP flexion in splint  Off for HEP about 5 x day  Initiate scar mobs today to dorsal scar - pt to do at home - cica scar pad to use night time  Decrease edema coming in - pt started using isotoner glove  2 layers Moldskin inside dorsal hand-based custom MC block splint for cushioning over dorsal hand where stitches is again  3-5 times a day contrast followed by abduction and adduction of digits-- doing well Metacarpal flexion with IP extension - to about 80  degrees in session - PROM 90 PROM to PIP/DIP flexion- - hold 15 sec x 10 With PROM - keeping MC at 90 - initially tight but increase in session in composite flexion Composite fist in MC splint to 2cm  blue foam block Place and hold- able to touch palm with 3rd thru 5th  Cont this last 2 sessions to hold off on  stretch and PROM aggressively to 2nd [facility] - focus on PIP/DIP flexion of 2nd  and 3rd with great progress  - and then into composite flexion  Traction to wrist gentle with PROM wrist extention and composite flexion -  8 x 30 sec  Prior to AROM and AAROM   Squeezing 3rd thru 5th into 2 cm -  and wide part 4 cm  with 2nd - touching 2 cm with 2nd  10-12 reps keeping pain under 2/10  Add teal med putty for lat pinch - 2x day 81f 12-15 reps- in short sausage pain free  silicone sleeves to decrease edema in second and third digits in combination with Tubigrip Patient educated on use of splint as well as home exercises and precautions.    PATIENT EDUCATION: Education details: Findings of evaluation and home program Person educated: Patient Education method: Explanation,  Demonstration, Tactile cues, Verbal cues, and Handouts Education comprehension: verbalized understanding, returned demonstration, verbal cues required, tactile cues required, and needs further education   GOALS: Goals reviewed with patient? Yes  SHORT TERM GOALS: Target date: 4 wks  Patient to be independent in home program to decrease scar tissue and edema - increased extension and flexion of digits as well as wrist Baseline: Severe stiffness in digits, i 3 days ago surgery stitches in place  this date- Goal status: INITIAL   LONG TERM GOALS: Target date: 8 wks Flexion of right digits improved for patient to touch palm to initiate use in ADLs with no increase symptoms Baseline: Surgery done 3 days ago -capsulectomy -MC flexion 65 to 70 degrees coming in.  PIP 5200 degrees.   Goal status: INITIAL   2.  Patient increase extension of right digits 2nd thru 4th to be able to don gloves, wash face and reach aroundcylinder objects without issues Baseline: 3 days ago patient had capsulectomy.  Extension of metacarpals -50-second and -40 degrees at third Goal status: INITIAL   3.  Scar tissue improved for patient to be able to make composite fist as well as increase extension to don gloves and put hand in pocket Baseline: 3 days ago had surgery.  Stitches in place.  Decreased flexion and extension with increased edema  goal status: INITIAL   4.  Right grip and prehension strength increased to more than 75% compared to the left for patient to hold weight to work out and cook Baseline: 3 days postop from capsulectomy with ligament release on second and third metacarpal no strength  tested Goal status: INITIAL   5.  R wrist AROM increase to WNL to push and pull door open without increase symptoms  Baseline:  wrist flxion 60, ext 50 Goal status: INITIAL  ASSESSMENT:  CLINICAL IMPRESSION: Patient presents at occupational therapy evaluation 3 days postop capsulectomy of right second and  third metacarpal with release of collateral ligaments.  Patient arrived with soft cast in place.  Patient had earlier this year multiple irrigation and debridements involving right dorsal and volar hand wounds -with abscess and infection.  Patient was seen by therapy in September.  But limited by scar tissue and joint capsule tightness.  Pt arrive with hand base hand-based MC flexion splint- MC flexion to 80-90 to wear same most of day and night - off 3-5 x day for ROM - flexion and extention - Tolerate HEP and ROM  well - Pt showing increased MC flexion  and PIP flexion compared to prior to surgery and tolerate home exercises well. This date show decrease edema using isotoner glove and keep pain under 2/10 with A/PROM.  Focus on PIP/DIP flexion  and then into composite- Pt focusing since stitches out earlier this week on PIP and DIP flexion- with great progress-  Patient limited in functional use of right dominant hand.  Patient can benefit from skilled OT services to decrease edema, scar tissue and increase  range of motion in flexion and extension of digits as well as wrist, increase strength to return to prior level of function. PERFORMANCE DEFICITS: in functional skills including ADLs, IADLs, coordination, dexterity, edema, ROM, strength, pain, flexibility, and UE functional use,   and psychosocial skills including habits and routines and behaviors.   IMPAIRMENTS: are limiting patient from ADLs, IADLs, rest and sleep, work, play, leisure, and social participation.   COMORBIDITIES: has no other co-morbidities that affects occupational performance. Patient will benefit from skilled OT to address above impairments and improve overall function.  MODIFICATION OR ASSISTANCE TO COMPLETE EVALUATION: No modification of tasks or assist necessary to complete an evaluation.  OT OCCUPATIONAL PROFILE AND HISTORY: Problem focused assessment: Including review of records relating to presenting problem.  CLINICAL  DECISION MAKING: LOW - limited treatment options, no task modification necessary  REHAB POTENTIAL: Good  EVALUATION COMPLEXITY: Low  PLAN:  OT FREQUENCY: 3 x wk initially - decrease depending on progress  OT DURATION: 8 weeks  PLANNED INTERVENTIONS: ADL's therapeutic exercise, therapeutic activity, manual therapy, scar mobilization, passive range of motion, splinting, paraffin, fluidotherapy, contrast bath, patient/family education, and DME and/or AE instructions  CONSULTED AND AGREED WITH PLAN OF CARE: Patient   Oletta Cohn, OTR/L,CLT 07/22/2023, 12:22 PM

## 2023-07-25 ENCOUNTER — Ambulatory Visit: Payer: No Typology Code available for payment source | Admitting: Occupational Therapy

## 2023-07-25 DIAGNOSIS — M25641 Stiffness of right hand, not elsewhere classified: Secondary | ICD-10-CM

## 2023-07-25 DIAGNOSIS — R600 Localized edema: Secondary | ICD-10-CM

## 2023-07-25 DIAGNOSIS — M6281 Muscle weakness (generalized): Secondary | ICD-10-CM

## 2023-07-25 DIAGNOSIS — L905 Scar conditions and fibrosis of skin: Secondary | ICD-10-CM

## 2023-07-25 DIAGNOSIS — M25631 Stiffness of right wrist, not elsewhere classified: Secondary | ICD-10-CM

## 2023-07-25 NOTE — Therapy (Signed)
OUTPATIENT OCCUPATIONAL THERAPY ORTHO  TREATMENT  Patient Name: Wanda Smith MRN: [id number] DOB:03/28/1961, 62 y.o., female   PCP: Merlinda Frederick NP REFERRING PROVIDER: Marney Doctor PA  END OF SESSION:  OT End of Session - 07/25/23 0817     Visit Number 22    Number of Visits 25    Date for OT Re-Evaluation 09/02/23    OT Start Time 0817    OT Stop Time 0900    OT Time Calculation (min) 43 min    Activity Tolerance Patient tolerated treatment well    Behavior During Therapy [facility] for tasks assessed/performed             Past Medical History:  Diagnosis Date   Anemia    Anxiety    Arthritis    knee (R)   Asthma    Carpal tunnel syndrome    Cellulitis of right hand 03/30/2023   Chronic kidney disease, stage 3a (HCC)    Dr Cherylann Ratel   COPD (chronic obstructive pulmonary disease) (HCC)    Depression    Diabetes mellitus without complication (HCC)    Dyspnea    GERD (gastroesophageal reflux disease)    occ   Hyperlipidemia    Hypertension    Hypothyroidism    Increased serum lipids    MRSA infection 03/30/2023   right hand   Osteoporosis    Severe sepsis (HCC) 03/30/2023   Type 2 diabetes mellitus with renal manifestations (HCC)    Wears contact lenses    Past Surgical History:  Procedure Laterality Date   ABDOMINAL HYSTERECTOMY  1998   CARPAL TUNNEL RELEASE Right 07/26/2018   Procedure: CARPAL TUNNEL RELEASE ENDOSCOPIC;  Surgeon: Christena Flake, MD;  Location: [facility];  Service: Orthopedics;  Laterality: Right;  diabetic - oral meds   CHOLECYSTECTOMY  2013   CLOSED REDUCTION METACARPAL WITH PERCUTANEOUS PINNING Right 07/05/2023   Procedure: MANIPULATION UNDER ANESTHESIA OF RIGHT INDEX AND LONG FINGERS WITH POSSIBLE CAPSULAR OF RIGHT INDEX AND LONG MCP JOINTS;  Surgeon: Christena Flake, MD;  Location: [facility];  Service: Orthopedics;  Laterality: Right;   COLONOSCOPY WITH ESOPHAGOGASTRODUODENOSCOPY (EGD)  03/03/2012   COLONOSCOPY WITH  ESOPHAGOGASTRODUODENOSCOPY (EGD)  02/23/2022   COLONOSCOPY WITH ESOPHAGOGASTRODUODENOSCOPY (EGD)  01/16/1991   COLONOSCOPY WITH PROPOFOL N/A 01/13/2018   Procedure: COLONOSCOPY WITH PROPOFOL;  Surgeon: Scot Jun, MD;  Location[facility];  Service: Endoscopy;  Laterality: N/A;   HERNIA REPAIR  1966   I & D EXTREMITY Right 04/01/2023   Procedure: IRRIGATION AND DEBRIDEMENT EXTREMITY PREVENA PLACEMENT;  Surgeon: Christena Flake, MD;  Location: [facility];  Service: Orthopedics;  Laterality: Right;   I-131 THYROID ABLATION     INCISION AND DRAINAGE ABSCESS Right 03/30/2023   Procedure: INCISION AND DRAINAGE ABSCESS RIGHT HAND;  Surgeon: Christena Flake, MD;  Location: [facility];  Service: Orthopedics;  Laterality: Right;   INCISION AND DRAINAGE OF WOUND Right 04/04/2023   Procedure: IRRIGATION AND DEBRIDEMENT RIGHT HAND DORSAL AND PALMAR INCISIONS WITH DELAYED PRIMARY CLOSURE;  Surgeon: Christena Flake, MD;  Location: [facility];  Service: Orthopedics;  Laterality: Right;   ROTATOR CUFF REPAIR Left 2011   SHOULDER ARTHROSCOPY WITH SUBACROMIAL DECOMPRESSION, ROTATOR CUFF REPAIR AND BICEP TENDON REPAIR Right 10/21/2021   Procedure: SHOULDER ARTHROSCOPY WITH DEBRIDEMENT, DECOMPRESSION, BICEPS TENODESIS.;  Surgeon: Christena Flake, MD;  Location: [facility];  Service: Orthopedics;  Laterality: Right;   TUBAL LIGATION  1993   Patient Active Problem List   Diagnosis Date  Noted   MRSA infection 04/04/2023   Abscess of right hand 03/31/2023   Cellulitis of right hand 03/30/2023   Depression with anxiety 03/30/2023   HLD (hyperlipidemia) 03/30/2023   Type II diabetes mellitus with renal manifestations (HCC) 03/30/2023   Chronic kidney disease, stage 3a (HCC) 03/30/2023   Abnormal LFTs 03/30/2023   Severe sepsis (HCC) 03/30/2023   Hypothyroidism    Hypertension    COPD (chronic obstructive pulmonary disease) (HCC)     ONSET DATE: 03/30/23  REFERRING DIAG:Abscess of R hand -3 irrigation  surgeries/Capsulectomy 2nd and 3rd MC - release of collateral ligaments  THERAPY DIAG:  Scar condition and fibrosis of skin  Localized edema  Muscle weakness (generalized)  Stiffness of right hand, not elsewhere classified  Stiffness of right wrist, not elsewhere classified  Rationale for Evaluation and Treatment: Rehabilitation  SUBJECTIVE:   SUBJECTIVE STATEMENT: Swelling coming down- can turn doorknob but still hard to open jars or bottles- putty doing okay - just trying not to over do -  pt accompanied by: self  PERTINENT HISTORY:  Ortho note 04/20/23 Wanda Smith is a 62 y.o. female who presents today for a skin check status post multiple irrigation and debridements involving right dorsal and volar hand wounds and possible suture removal. The patient was admitted to the hospital after suffering a dog scratch which eventually became more swollen and painful. The patient underwent a CT scan of the right hand which demonstrated a abscess formation along the volar aspect of the hand initially. She underwent 1 irrigation debridement however the patient continued to have swelling and pain prompting a repeat CT scan which demonstrated abscess formation along the dorsal aspect of the hand. The patient underwent a second irrigation debridement and a final third procedure performed by Dr. Joice Lofts on 04/04/2023. The patient was evaluated last week where skin inspection demonstrated excellent granulation without any significant erythema or purulent drainage. The patient then had a follow-up with infectious disease where repeat CRP came back at a normal level. The patient does still have a oral prescription for linezolid which she is scheduled to take until end this week. She denies any trauma or injury affecting the right hand since her last evaluation. She does report an occasional burning discomfort in the index finger. She is still taking occasional oxycodone. She reports a 0 out of 10 pain score  at today's visit. Stitches remove- and to finish antibiotics and refer to OT. ON 07/05/2023 - Pt had by Dr Joice Lofts   Attempted closed manipulation under anesthesia,  then had open capsulectomies with release of radial and ulnar collateral ligaments of both index and long finger MCP joints.- refer to OT to start ROM , splinting and tx 3 days later    PRECAUTIONS: None     WEIGHT BEARING RESTRICTIONS: No  PAIN:  Are you having pain? 3-4/10 after the stretches   FALLS: Has patient fallen in last 6 months? No  LIVING ENVIRONMENT: Lives with: lives with their family and lives with their spouse   PLOF: Work on [employer], gym 3 x wk , do own cooking and housework  PATIENT GOALS: Get my motion and strength back in the right hand so that I can carry and grip and hold objects  NEXT MD VISIT: 11th Nov OBJECTIVE:   HAND DOMINANCE: Right   UPPER EXTREMITY ROM:     Active ROM Right eval Left eval R  04/29/23 05/03/23 05/16/23 R 9/16/24R 06/02/23 R R 07/08/23 R 07/18/23  Shoulder flexion  Shoulder abduction           Shoulder adduction           Shoulder extension           Shoulder internal rotation           Shoulder external rotation           Elbow flexion           Elbow extension           Wrist flexion 35 110 50 50 63 72 80 60 76  Wrist extension 48 85 60 55 65 70 70 50 67  Wrist ulnar deviation 24      35 30   Wrist radial deviation 20      25 20    Wrist pronation           Wrist supination           (Blank rows = not tested)  Active ROM Right eval Left eval R   04/29/23 R  05/12/23 R  05/16/23 R 05/23/23 R 06/02/23 R 06/09/23 R 07/08/23 R 07/13/23  In session R 07/22/23  Thumb MCP (0-60) 70            Thumb IP (0-80) 35            Thumb Radial abd/add (0-55) 45    50  50 50      Thumb Palmar abd/add (0-45) 45    50  65 55      Thumb Opposition to Small Finger Opposition to 3rd, 4th and 5th     Opposition to all digits   Opposition to base of 5th       Index  MCP (0-90) 30   50 60 60( 65 66 70 70 75 75  Index PIP (0-100) 45( ext -30   55 ( ext -20 60 ( ext -10) 60 ( blocked 70) 60 66 70 50 70 70  Index DIP (0-70)       50        Long MCP (0-90)  45   50 60 65 70 70 75 ( 80 in session)  Ext -50 flexion65 80 75  Long PIP (0-100)  90( ext -20   95 ( ext -15 95 ( ext -5)  100 100 100 90 80 95  Long DIP (0-70)               Ring MCP (0-90)  45   60 70 75 75 80 80( 90 in session Ext -45 flexion 70 90 90  Ring PIP (0-100)  100 (ext -10   100( ext 0  100 - full ext  100 100 100 100 95 95  Ring DIP (0-70)               Little MCP (0-90)  75   75 80  80 85 85( 90 in session) 65 90 85  Little PIP (0-100)  90 (ext 0   100 ( ext 0 100- full ext   100 100 100 10 95 100  Little DIP (0-70)               (Blank rows = not tested) I  05/23/23 GRIP R 14 lbs L 55 lbs, lat pinch R 4 lbs L 14 lbs and 3 point pinch R 5 lbs and L 10 lbs   05/30/23: Lat grip R 7 lbs , 14 L - 3 point R 6lbs , L  10 lbs   06/07/23 GRIP R 15 lbs L 55 lbs, lat pinch R 7 lbs L 14 lbs and 3 point pinch R 8 lbs and L 10 lbs 06/21/23 GRIP R 12 lbs L 48 lbs, lat pinch R 5 lbs L 14 lbs and 3 point pinch R 6 lbs and L 10 lbs  COORDINATION: Decreasing significantly because of stiffness and scar tissue in the second digit  EDEMA: R hand   COGNITION: Overall cognitive status: Within functional limits for tasks assessed    OBSERVATIONS:  Patient arrived with soft cast on.  Removed easily with no issues.  Xeroform on and applied 2 x 2 gauze under Tubigrip D over hand   TODAY'S TREATMENT:                                                                                                                              DATE:  07/25/23 Verbal order from Dr. Joice Lofts 07/08/23 - 8:33 -done a dorsal capsulectomy with release of radial and ulnar collateral ligaments to the right second and third metacarpal.  Did not had any tendon involvement.  Can continue with therapy.  Would need some type of static or  dynamic splint.  FLuido done  8 min - decrease  stiffness for wrist and digits AROM in all planes    Cont MC block splint to increase MC flexion in splint to 80-90 flexion - wear most of the day and night time to maintain stretch on MC - can do PROM to PIP flexion in splint  Off for HEP about 5 x day  Cont scar mobsto dorsal scar - pt to do at home - cica scar pad to use night time  Decrease edema coming in - pt started using isotoner glove last week 2 layers Moldskin inside dorsal hand-based custom MC block splint for cushioning over dorsal hand where stitches is again  3-5 times a day contrast followed by abduction and adduction of digits-- doing well Metacarpal flexion with IP extension - to about 80  degrees in session - PROM 90 PROM to PIP/DIP flexion- - hold 15 sec x 10 With PROM - keeping MC at 90 - initially tight but increase in session in composite flexion Composite fist in MC splint  palm for 3rd thru 5th - to 2cm for 2nd to   blue foam block Place and hold- able to touch palm with 3rd thru 5th  focus on PIP/DIP flexion of 2nd  and 3rd with great progress to be able to do oval with thumb and do flexion stretch using thumb   Traction to wrist gentle with PROM wrist extention and composite flexion -  8 x 30 sec  Prior to AROM and AAROM   Squeezing into putty light blue easy- place and hold composite for 3rd thru 5th into putty  10-12 reps keeping pain under 2/10  cont teal med putty for lat pinch - 2x day  12-15 reps-  in short sausage pain free  silicone sleeves to decrease edema in second and third digits in combination with Tubigrip Patient educated on use of splint as well as home exercises and precautions.    PATIENT EDUCATION: Education details: Findings of evaluation and home program Person educated: Patient Education method: Explanation, Demonstration, Tactile cues, Verbal cues, and Handouts Education comprehension: verbalized understanding, returned demonstration,  verbal cues required, tactile cues required, and needs further education   GOALS: Goals reviewed with patient? Yes  SHORT TERM GOALS: Target date: 4 wks  Patient to be independent in home program to decrease scar tissue and edema - increased extension and flexion of digits as well as wrist Baseline: Severe stiffness in digits, i 3 days ago surgery stitches in place  this date- Goal status: INITIAL   LONG TERM GOALS: Target date: 8 wks Flexion of right digits improved for patient to touch palm to initiate use in ADLs with no increase symptoms Baseline: Surgery done 3 days ago -capsulectomy -MC flexion 65 to 70 degrees coming in.  PIP 5200 degrees.   Goal status: INITIAL   2.  Patient increase extension of right digits 2nd thru 4th to be able to don gloves, wash face and reach aroundcylinder objects without issues Baseline: 3 days ago patient had capsulectomy.  Extension of metacarpals -50-second and -40 degrees at third Goal status: INITIAL   3.  Scar tissue improved for patient to be able to make composite fist as well as increase extension to don gloves and put hand in pocket Baseline: 3 days ago had surgery.  Stitches in place.  Decreased flexion and extension with increased edema  goal status: INITIAL   4.  Right grip and prehension strength increased to more than 75% compared to the left for patient to hold weight to work out and cook Baseline: 3 days postop from capsulectomy with ligament release on second and third metacarpal no strength  tested Goal status: INITIAL   5.  R wrist AROM increase to WNL to push and pull door open without increase symptoms  Baseline:  wrist flxion 60, ext 50 Goal status: INITIAL  ASSESSMENT:  CLINICAL IMPRESSION: Patient presents at occupational therapy evaluation 3 days postop capsulectomy of right second and third metacarpal with release of collateral ligaments.  Patient arrived with soft cast in place.  Patient had earlier this year  multiple irrigation and debridements involving right dorsal and volar hand wounds -with abscess and infection.  Patient was seen by therapy in September.  But limited by scar tissue and joint capsule tightness.  Pt arrive with hand base hand-based MC flexion splint- MC flexion to 80-90 to wear same most of day and night - off 3-5 x day for ROM - flexion and extention - Tolerate HEP and ROM  well - Pt showing increased MC flexion  and PIP flexion compared to prior to surgery and tolerate home exercises well. Show decrease edema using isotoner glove and keep pain under 2/10 with A/PROM.  Focus on PIP/DIP flexion  and then into composite adding MC - focus on place and hold for 3rd thru 5th to palm - Add light blue putty for composite gripping - Patient limited in functional use of right dominant hand.  Patient can benefit from skilled OT services to decrease edema, scar tissue and increase range of motion in flexion and extension of digits as well as wrist, increase strength to return to prior level of function. PERFORMANCE DEFICITS: in functional skills including ADLs, IADLs, coordination,  dexterity, edema, ROM, strength, pain, flexibility, and UE functional use,   and psychosocial skills including habits and routines and behaviors.   IMPAIRMENTS: are limiting patient from ADLs, IADLs, rest and sleep, work, play, leisure, and social participation.   COMORBIDITIES: has no other co-morbidities that affects occupational performance. Patient will benefit from skilled OT to address above impairments and improve overall function.  MODIFICATION OR ASSISTANCE TO COMPLETE EVALUATION: No modification of tasks or assist necessary to complete an evaluation.  OT OCCUPATIONAL PROFILE AND HISTORY: Problem focused assessment: Including review of records relating to presenting problem.  CLINICAL DECISION MAKING: LOW - limited treatment options, no task modification necessary  REHAB POTENTIAL: Good  EVALUATION  COMPLEXITY: Low  PLAN:  OT FREQUENCY: 3 x wk initially - decrease depending on progress  OT DURATION: 8 weeks  PLANNED INTERVENTIONS: ADL's therapeutic exercise, therapeutic activity, manual therapy, scar mobilization, passive range of motion, splinting, paraffin, fluidotherapy, contrast bath, patient/family education, and DME and/or AE instructions  CONSULTED AND AGREED WITH PLAN OF CARE: Patient   Oletta Cohn, OTR/L,CLT 07/25/2023, 9:01 AM

## 2023-07-26 ENCOUNTER — Ambulatory Visit: Payer: No Typology Code available for payment source | Admitting: Occupational Therapy

## 2023-07-26 DIAGNOSIS — M25641 Stiffness of right hand, not elsewhere classified: Secondary | ICD-10-CM

## 2023-07-26 DIAGNOSIS — R600 Localized edema: Secondary | ICD-10-CM

## 2023-07-26 DIAGNOSIS — L905 Scar conditions and fibrosis of skin: Secondary | ICD-10-CM | POA: Diagnosis not present

## 2023-07-26 DIAGNOSIS — M25631 Stiffness of right wrist, not elsewhere classified: Secondary | ICD-10-CM

## 2023-07-26 DIAGNOSIS — M6281 Muscle weakness (generalized): Secondary | ICD-10-CM

## 2023-07-26 NOTE — Therapy (Signed)
OUTPATIENT OCCUPATIONAL THERAPY ORTHO  TREATMENT  Patient Name: Wanda Smith MRN: [id number] DOB:01/27/1961, 62 y.o., female   PCP: Wanda Smith REFERRING PROVIDER: Marney Doctor PA  END OF SESSION:  OT End of Session - 07/26/23 0824     Visit Number 23    Number of Visits 25    Date for OT Re-Evaluation 09/02/23    OT Start Time 0818    OT Stop Time 0858    OT Time Calculation (min) 40 min    Activity Tolerance Patient tolerated treatment well    Behavior During Therapy [facility] for tasks assessed/performed             Past Medical History:  Diagnosis Date   Anemia    Anxiety    Arthritis    knee (R)   Asthma    Carpal tunnel syndrome    Cellulitis of right hand 03/30/2023   Chronic kidney disease, stage 3a (HCC)    Wanda Smith   COPD (chronic obstructive pulmonary disease) (HCC)    Depression    Diabetes mellitus without complication (HCC)    Dyspnea    GERD (gastroesophageal reflux disease)    occ   Hyperlipidemia    Hypertension    Hypothyroidism    Increased serum lipids    MRSA infection 03/30/2023   right hand   Osteoporosis    Severe sepsis (HCC) 03/30/2023   Type 2 diabetes mellitus with renal manifestations (HCC)    Wears contact lenses    Past Surgical History:  Procedure Laterality Date   ABDOMINAL HYSTERECTOMY  1998   CARPAL TUNNEL RELEASE Right 07/26/2018   Procedure: CARPAL TUNNEL RELEASE ENDOSCOPIC;  Surgeon: Wanda Flake, MD;  Location: [facility];  Service: Orthopedics;  Laterality: Right;  diabetic - oral meds   CHOLECYSTECTOMY  2013   CLOSED REDUCTION METACARPAL WITH PERCUTANEOUS PINNING Right 07/05/2023   Procedure: MANIPULATION UNDER ANESTHESIA OF RIGHT INDEX AND LONG FINGERS WITH POSSIBLE CAPSULAR OF RIGHT INDEX AND LONG MCP JOINTS;  Surgeon: Wanda Flake, MD;  Location: [facility];  Service: Orthopedics;  Laterality: Right;   COLONOSCOPY WITH ESOPHAGOGASTRODUODENOSCOPY (EGD)  03/03/2012   COLONOSCOPY WITH  ESOPHAGOGASTRODUODENOSCOPY (EGD)  02/23/2022   COLONOSCOPY WITH ESOPHAGOGASTRODUODENOSCOPY (EGD)  01/16/1991   COLONOSCOPY WITH PROPOFOL N/A 01/13/2018   Procedure: COLONOSCOPY WITH PROPOFOL;  Surgeon: Wanda Jun, MD;  Location: [facility];  Service: Endoscopy;  Laterality: N/A;   HERNIA REPAIR  1966   I & D EXTREMITY Right 04/01/2023   Procedure: IRRIGATION AND DEBRIDEMENT EXTREMITY PREVENA PLACEMENT;  Surgeon: Wanda Flake, MD;  Location: [facility];  Service: Orthopedics;  Laterality: Right;   I-131 THYROID ABLATION     INCISION AND DRAINAGE ABSCESS Right 03/30/2023   Procedure: INCISION AND DRAINAGE ABSCESS RIGHT HAND;  Surgeon: Wanda Flake, MD;  Location: [facility];  Service: Orthopedics;  Laterality: Right;   INCISION AND DRAINAGE OF WOUND Right 04/04/2023   Procedure: IRRIGATION AND DEBRIDEMENT RIGHT HAND DORSAL AND PALMAR INCISIONS WITH DELAYED PRIMARY CLOSURE;  Surgeon: Wanda Flake, MD;  Location: [facility];  Service: Orthopedics;  Laterality: Right;   ROTATOR CUFF REPAIR Left 2011   SHOULDER ARTHROSCOPY WITH SUBACROMIAL DECOMPRESSION, ROTATOR CUFF REPAIR AND BICEP TENDON REPAIR Right 10/21/2021   Procedure: SHOULDER ARTHROSCOPY WITH DEBRIDEMENT, DECOMPRESSION, BICEPS TENODESIS.;  Surgeon: Wanda Flake, MD;  Location: [facility];  Service: Orthopedics;  Laterality: Right;   TUBAL LIGATION  1993   Patient Active Problem List   Diagnosis Date  Noted   MRSA infection 04/04/2023   Abscess of right hand 03/31/2023   Cellulitis of right hand 03/30/2023   Depression with anxiety 03/30/2023   HLD (hyperlipidemia) 03/30/2023   Type II diabetes mellitus with renal manifestations (HCC) 03/30/2023   Chronic kidney disease, stage 3a (HCC) 03/30/2023   Abnormal LFTs 03/30/2023   Severe sepsis (HCC) 03/30/2023   Hypothyroidism    Hypertension    COPD (chronic obstructive pulmonary disease) (HCC)     ONSET DATE: 03/30/23  REFERRING DIAG:Abscess of R hand -3 irrigation  surgeries/Capsulectomy 2nd and 3rd MC - release of collateral ligaments  THERAPY DIAG:  Scar condition and fibrosis of skin  Localized edema  Muscle weakness (generalized)  Stiffness of right hand, not elsewhere classified  Stiffness of right wrist, not elsewhere classified  Rationale for Evaluation and Treatment: Rehabilitation  SUBJECTIVE:   SUBJECTIVE STATEMENT: Swelling coming down-  putty doing okay - just trying not to over do -  pt accompanied by: self  PERTINENT HISTORY:  Ortho note 04/20/23 Wanda Smith is a 62 y.o. female who presents today for a skin check status post multiple irrigation and debridements involving right dorsal and volar hand wounds and possible suture removal. The patient was admitted to the hospital after suffering a dog scratch which eventually became more swollen and painful. The patient underwent a CT scan of the right hand which demonstrated a abscess formation along the volar aspect of the hand initially. She underwent 1 irrigation debridement however the patient continued to have swelling and pain prompting a repeat CT scan which demonstrated abscess formation along the dorsal aspect of the hand. The patient underwent a second irrigation debridement and a final third procedure performed by Wanda Smith on 04/04/2023. The patient was evaluated last week where skin inspection demonstrated excellent granulation without any significant erythema or purulent drainage. The patient then had a follow-up with infectious disease where repeat CRP came back at a normal level. The patient does still have a oral prescription for linezolid which she is scheduled to take until end this week. She denies any trauma or injury affecting the right hand since her last evaluation. She does report an occasional burning discomfort in the index finger. She is still taking occasional oxycodone. She reports a 0 out of 10 pain score at today's visit. Stitches remove- and to finish  antibiotics and refer to OT. ON 07/05/2023 - Pt had by Wanda Joice Smith   Attempted closed manipulation under anesthesia,  then had open capsulectomies with release of radial and ulnar collateral ligaments of both index and long finger MCP joints.- refer to OT to start ROM , splinting and tx 3 days later    PRECAUTIONS: None     WEIGHT BEARING RESTRICTIONS: No  PAIN:  Are you having pain? 3-4/10 after the stretches   FALLS: Has patient fallen in last 6 months? No  LIVING ENVIRONMENT: Lives with: lives with their family and lives with their spouse   PLOF: Work on [employer], gym 3 x wk , do own cooking and housework  PATIENT GOALS: Get my motion and strength back in the right hand so that I can carry and grip and hold objects  NEXT MD VISIT: 11th Nov OBJECTIVE:   HAND DOMINANCE: Right   UPPER EXTREMITY ROM:     Active ROM Right eval Left eval R  04/29/23 05/03/23 05/16/23 R 9/16/24R 06/02/23 R R 07/08/23 R 07/18/23  Shoulder flexion  Shoulder abduction           Shoulder adduction           Shoulder extension           Shoulder internal rotation           Shoulder external rotation           Elbow flexion           Elbow extension           Wrist flexion 35 110 50 50 63 72 80 60 76  Wrist extension 48 85 60 55 65 70 70 50 67  Wrist ulnar deviation 24      35 30   Wrist radial deviation 20      25 20    Wrist pronation           Wrist supination           (Blank rows = not tested)  Active ROM Right eval Left eval R   04/29/23 R  05/12/23 R  05/16/23 R 05/23/23 R 06/02/23 R 06/09/23 R 07/08/23 R 07/13/23  In session R 07/22/23  Thumb MCP (0-60) 70            Thumb IP (0-80) 35            Thumb Radial abd/add (0-55) 45    50  50 50      Thumb Palmar abd/add (0-45) 45    50  65 55      Thumb Opposition to Small Finger Opposition to 3rd, 4th and 5th     Opposition to all digits   Opposition to base of 5th       Index MCP (0-90) 30   50 60 60( 65 66 70 70 75 75  Index  PIP (0-100) 45( ext -30   55 ( ext -20 60 ( ext -10) 60 ( blocked 70) 60 66 70 50 70 70  Index DIP (0-70)       50        Long MCP (0-90)  45   50 60 65 70 70 75 ( 80 in session)  Ext -50 flexion65 80 75  Long PIP (0-100)  90( ext -20   95 ( ext -15 95 ( ext -5)  100 100 100 90 80 95  Long DIP (0-70)               Ring MCP (0-90)  45   60 70 75 75 80 80( 90 in session Ext -45 flexion 70 90 90  Ring PIP (0-100)  100 (ext -10   100( ext 0  100 - full ext  100 100 100 100 95 95  Ring DIP (0-70)               Little MCP (0-90)  75   75 80  80 85 85( 90 in session) 65 90 85  Little PIP (0-100)  90 (ext 0   100 ( ext 0 100- full ext   100 100 100 10 95 100  Little DIP (0-70)               (Blank rows = not tested) I  05/23/23 GRIP R 14 lbs L 55 lbs, lat pinch R 4 lbs L 14 lbs and 3 point pinch R 5 lbs and L 10 lbs   05/30/23: Lat grip R 7 lbs , 14 L - 3 point R 6lbs , L  10 lbs   06/07/23 GRIP R 15 lbs L 55 lbs, lat pinch R 7 lbs L 14 lbs and 3 point pinch R 8 lbs and L 10 lbs 06/21/23 GRIP R 12 lbs L 48 lbs, lat pinch R 5 lbs L 14 lbs and 3 point pinch R 6 lbs and L 10 lbs 06/25/23 GRIP R 15 lbs L 48 lbs, lat pinch R 6lbs L 14 lbs and 3 point pinch R 8 lbs and L 10 lbs  COORDINATION: Decreasing significantly because of stiffness and scar tissue in the second digit  EDEMA: R hand   COGNITION: Overall cognitive status: Within functional limits for tasks assessed    OBSERVATIONS:  Patient arrived with soft cast on.  Removed easily with no issues.  Xeroform on and applied 2 x 2 gauze under Tubigrip D over hand   TODAY'S TREATMENT:                                                                                                                              DATE:  07/25/23 Verbal order from Wanda Smith 07/08/23 - 8:33 -done a dorsal capsulectomy with release of radial and ulnar collateral ligaments to the right second and third metacarpal.  Did not had any tendon involvement.  Can continue with  therapy.  Would need some type of static or dynamic splint.  FLuido done  8 min - decrease  stiffness for wrist and digits AROM in all planes  Followed by grip and prehension strength assessment - see flowsheet  Cont MC block splint to increase MC flexion in splint to 80-90 flexion - wear most of the day and night time to maintain stretch on MC - can do PROM to PIP flexion in splint  Off for HEP about 5 x day  Cont scar mobs to dorsal scar - pt to do at home - cica scar pad to use night time  Decrease edema coming in - pt started using isotoner glove  2 layers Moldskin inside dorsal hand-based custom MC block splint for cushioning over dorsal hand where stitches is again  3-5 times a day contrast followed by abduction and adduction of digits-- doing well Metacarpal flexion with IP extension - to about 80  degrees in session - PROM 90 PROM to PIP/DIP flexion- - hold 15 sec x 10 With PROM - keeping MC at 90 - initially tight but increase in session in composite flexion Composite fist ito palm for 3rd thru 5th - to 2cm for 2nd to   blue foam block Place and hold- able to touch palm with 3rd thru 5th  focus on PIP/DIP flexion of 2nd  and 3rd with great progress to be able to do oval with thumb and do flexion stretch using thumb   Traction to wrist gentle with PROM wrist extention and composite flexion -  8 x 30 sec  Prior to AROM and AAROM   Squeezing into putty light blue  easy- place and hold composite for 3rd thru 5th into putty  10-12 reps keeping pain under 2/10  cont teal med putty for lat pinch - 2x day  12-15 reps- in short sausage pain free  silicone sleeves to decrease edema in second and third digits in combination with Tubigrip Patient educated on use of splint as well as home exercises and precautions.    PATIENT EDUCATION: Education details: Findings of evaluation and home program Person educated: Patient Education method: Explanation, Demonstration, Tactile cues, Verbal  cues, and Handouts Education comprehension: verbalized understanding, returned demonstration, verbal cues required, tactile cues required, and needs further education   GOALS: Goals reviewed with patient? Yes  SHORT TERM GOALS: Target date: 4 wks  Patient to be independent in home program to decrease scar tissue and edema - increased extension and flexion of digits as well as wrist Baseline: Severe stiffness in digits, i 3 days ago surgery stitches in place  this date- Goal status: INITIAL   LONG TERM GOALS: Target date: 8 wks Flexion of right digits improved for patient to touch palm to initiate use in ADLs with no increase symptoms Baseline: Surgery done 3 days ago -capsulectomy -MC flexion 65 to 70 degrees coming in.  PIP 5200 degrees.   Goal status: INITIAL   2.  Patient increase extension of right digits 2nd thru 4th to be able to don gloves, wash face and reach aroundcylinder objects without issues Baseline: 3 days ago patient had capsulectomy.  Extension of metacarpals -50-second and -40 degrees at third Goal status: INITIAL   3.  Scar tissue improved for patient to be able to make composite fist as well as increase extension to don gloves and put hand in pocket Baseline: 3 days ago had surgery.  Stitches in place.  Decreased flexion and extension with increased edema  goal status: INITIAL   4.  Right grip and prehension strength increased to more than 75% compared to the left for patient to hold weight to work out and cook Baseline: 3 days postop from capsulectomy with ligament release on second and third metacarpal no strength  tested Goal status: INITIAL   5.  R wrist AROM increase to WNL to push and pull door open without increase symptoms  Baseline:  wrist flxion 60, ext 50 Goal status: INITIAL  ASSESSMENT:  CLINICAL IMPRESSION: Patient presents at occupational therapy evaluation 3 days postop capsulectomy of right second and third metacarpal with release of  collateral ligaments.  Patient arrived with soft cast in place.  Patient had earlier this year multiple irrigation and debridements involving right dorsal and volar hand wounds -with abscess and infection.  Patient was seen by therapy in September.  But limited by scar tissue and joint capsule tightness.  Pt arrive with hand base hand-based MC flexion splint- MC flexion to 80-90 to wear same most of day and night - off 3-5 x day for ROM - flexion and extention - Tolerate HEP and ROM  well - Pt showing increased MC flexion  and PIP flexion compared to prior to surgery and tolerate home exercises well. Show decrease edema using isotoner glove and keep pain under 2/10 with A/PROM.  Focus on PIP/DIP flexion  and then into composite adding MC - focus on place and hold for 3rd thru 5th to palm -cont light blue putty for composite gripping -  good progress from last visit - Patient limited in functional use of right dominant hand.  Patient can benefit from skilled OT services  to decrease edema, scar tissue and increase range of motion in flexion and extension of digits as well as wrist, increase strength to return to prior level of function. PERFORMANCE DEFICITS: in functional skills including ADLs, IADLs, coordination, dexterity, edema, ROM, strength, pain, flexibility, and UE functional use,   and psychosocial skills including habits and routines and behaviors.   IMPAIRMENTS: are limiting patient from ADLs, IADLs, rest and sleep, work, play, leisure, and social participation.   COMORBIDITIES: has no other co-morbidities that affects occupational performance. Patient will benefit from skilled OT to address above impairments and improve overall function.  MODIFICATION OR ASSISTANCE TO COMPLETE EVALUATION: No modification of tasks or assist necessary to complete an evaluation.  OT OCCUPATIONAL PROFILE AND HISTORY: Problem focused assessment: Including review of records relating to presenting problem.  CLINICAL  DECISION MAKING: LOW - limited treatment options, no task modification necessary  REHAB POTENTIAL: Good  EVALUATION COMPLEXITY: Low  PLAN:  OT FREQUENCY: 3 x wk initially - decrease depending on progress  OT DURATION: 8 weeks  PLANNED INTERVENTIONS: ADL's therapeutic exercise, therapeutic activity, manual therapy, scar mobilization, passive range of motion, splinting, paraffin, fluidotherapy, contrast bath, patient/family education, and DME and/or AE instructions  CONSULTED AND AGREED WITH PLAN OF CARE: Patient   Oletta Cohn, OTR/L,CLT 07/26/2023, 5:21 PM

## 2023-07-28 ENCOUNTER — Encounter: Payer: No Typology Code available for payment source | Admitting: Occupational Therapy

## 2023-07-29 ENCOUNTER — Ambulatory Visit: Payer: No Typology Code available for payment source | Admitting: Occupational Therapy

## 2023-07-29 DIAGNOSIS — M6281 Muscle weakness (generalized): Secondary | ICD-10-CM

## 2023-07-29 DIAGNOSIS — M25641 Stiffness of right hand, not elsewhere classified: Secondary | ICD-10-CM

## 2023-07-29 DIAGNOSIS — R600 Localized edema: Secondary | ICD-10-CM

## 2023-07-29 DIAGNOSIS — L905 Scar conditions and fibrosis of skin: Secondary | ICD-10-CM

## 2023-07-29 DIAGNOSIS — M25631 Stiffness of right wrist, not elsewhere classified: Secondary | ICD-10-CM

## 2023-07-29 NOTE — Therapy (Signed)
OUTPATIENT OCCUPATIONAL THERAPY ORTHO  TREATMENT  Patient Name: Wanda Smith MRN: [id number] DOB:Jan 21, 1961, 62 y.o., female   PCP: Merlinda Frederick NP REFERRING PROVIDER: Marney Doctor PA  END OF SESSION:  OT End of Session - 07/29/23 1124     Visit Number 24    Number of Visits 25    Date for OT Re-Evaluation 09/02/23    OT Start Time 0820    OT Stop Time 0904    OT Time Calculation (min) 44 min    Activity Tolerance Patient tolerated treatment well    Behavior During Therapy [facility] for tasks assessed/performed             Past Medical History:  Diagnosis Date   Anemia    Anxiety    Arthritis    knee (R)   Asthma    Carpal tunnel syndrome    Cellulitis of right hand 03/30/2023   Chronic kidney disease, stage 3a (HCC)    Dr Cherylann Ratel   COPD (chronic obstructive pulmonary disease) (HCC)    Depression    Diabetes mellitus without complication (HCC)    Dyspnea    GERD (gastroesophageal reflux disease)    occ   Hyperlipidemia    Hypertension    Hypothyroidism    Increased serum lipids    MRSA infection 03/30/2023   right hand   Osteoporosis    Severe sepsis (HCC) 03/30/2023   Type 2 diabetes mellitus with renal manifestations (HCC)    Wears contact lenses    Past Surgical History:  Procedure Laterality Date   ABDOMINAL HYSTERECTOMY  1998   CARPAL TUNNEL RELEASE Right 07/26/2018   Procedure: CARPAL TUNNEL RELEASE ENDOSCOPIC;  Surgeon: Christena Flake, MD;  Location: [facility];  Service: Orthopedics;  Laterality: Right;  diabetic - oral meds   CHOLECYSTECTOMY  2013   CLOSED REDUCTION METACARPAL WITH PERCUTANEOUS PINNING Right 07/05/2023   Procedure: MANIPULATION UNDER ANESTHESIA OF RIGHT INDEX AND LONG FINGERS WITH POSSIBLE CAPSULAR OF RIGHT INDEX AND LONG MCP JOINTS;  Surgeon: Christena Flake, MD;  Location: [facility];  Service: Orthopedics;  Laterality: Right;   COLONOSCOPY WITH ESOPHAGOGASTRODUODENOSCOPY (EGD)  03/03/2012   COLONOSCOPY WITH  ESOPHAGOGASTRODUODENOSCOPY (EGD)  02/23/2022   COLONOSCOPY WITH ESOPHAGOGASTRODUODENOSCOPY (EGD)  01/16/1991   COLONOSCOPY WITH PROPOFOL N/A 01/13/2018   Procedure: COLONOSCOPY WITH PROPOFOL;  Surgeon: Scot Jun, MD;  Location: [facility];  Service: Endoscopy;  Laterality: N/A;   HERNIA REPAIR  1966   I & D EXTREMITY Right 04/01/2023   Procedure: IRRIGATION AND DEBRIDEMENT EXTREMITY PREVENA PLACEMENT;  Surgeon: Christena Flake, MD;  Location: [facility];  Service: Orthopedics;  Laterality: Right;   I-131 THYROID ABLATION     INCISION AND DRAINAGE ABSCESS Right 03/30/2023   Procedure: INCISION AND DRAINAGE ABSCESS RIGHT HAND;  Surgeon: Christena Flake, MD;  Location: [facility];  Service: Orthopedics;  Laterality: Right;   INCISION AND DRAINAGE OF WOUND Right 04/04/2023   Procedure: IRRIGATION AND DEBRIDEMENT RIGHT HAND DORSAL AND PALMAR INCISIONS WITH DELAYED PRIMARY CLOSURE;  Surgeon: Christena Flake, MD;  Location: [facility];  Service: Orthopedics;  Laterality: Right;   ROTATOR CUFF REPAIR Left 2011   SHOULDER ARTHROSCOPY WITH SUBACROMIAL DECOMPRESSION, ROTATOR CUFF REPAIR AND BICEP TENDON REPAIR Right 10/21/2021   Procedure: SHOULDER ARTHROSCOPY WITH DEBRIDEMENT, DECOMPRESSION, BICEPS TENODESIS.;  Surgeon: Christena Flake, MD;  Location: [facility];  Service: Orthopedics;  Laterality: Right;   TUBAL LIGATION  1993   Patient Active Problem List   Diagnosis Date  Noted   MRSA infection 04/04/2023   Abscess of right hand 03/31/2023   Cellulitis of right hand 03/30/2023   Depression with anxiety 03/30/2023   HLD (hyperlipidemia) 03/30/2023   Type II diabetes mellitus with renal manifestations (HCC) 03/30/2023   Chronic kidney disease, stage 3a (HCC) 03/30/2023   Abnormal LFTs 03/30/2023   Severe sepsis (HCC) 03/30/2023   Hypothyroidism    Hypertension    COPD (chronic obstructive pulmonary disease) (HCC)     ONSET DATE: 03/30/23  REFERRING DIAG:Abscess of R hand -3 irrigation  surgeries/Capsulectomy 2nd and 3rd MC - release of collateral ligaments  THERAPY DIAG:  Scar condition and fibrosis of skin  Localized edema  Muscle weakness (generalized)  Stiffness of right hand, not elsewhere classified  Stiffness of right wrist, not elsewhere classified  Rationale for Evaluation and Treatment: Rehabilitation  SUBJECTIVE:   SUBJECTIVE STATEMENT: Using my hand more.  Have some weakness with like holding the pot  in hand and lifting and pouring.  Still is not just the grip but my forearm. pt accompanied by: self  PERTINENT HISTORY:  Ortho note 04/20/23 Wanda Smith is a 62 y.o. female who presents today for a skin check status post multiple irrigation and debridements involving right dorsal and volar hand wounds and possible suture removal. The patient was admitted to the hospital after suffering a dog scratch which eventually became more swollen and painful. The patient underwent a CT scan of the right hand which demonstrated a abscess formation along the volar aspect of the hand initially. She underwent 1 irrigation debridement however the patient continued to have swelling and pain prompting a repeat CT scan which demonstrated abscess formation along the dorsal aspect of the hand. The patient underwent a second irrigation debridement and a final third procedure performed by Dr. Joice Lofts on 04/04/2023. The patient was evaluated last week where skin inspection demonstrated excellent granulation without any significant erythema or purulent drainage. The patient then had a follow-up with infectious disease where repeat CRP came back at a normal level. The patient does still have a oral prescription for linezolid which she is scheduled to take until end this week. She denies any trauma or injury affecting the right hand since her last evaluation. She does report an occasional burning discomfort in the index finger. She is still taking occasional oxycodone. She reports a 0 out  of 10 pain score at today's visit. Stitches remove- and to finish antibiotics and refer to OT. ON 07/05/2023 - Pt had by Dr Joice Lofts   Attempted closed manipulation under anesthesia,  then had open capsulectomies with release of radial and ulnar collateral ligaments of both index and long finger MCP joints.- refer to OT to start ROM , splinting and tx 3 days later    PRECAUTIONS: None     WEIGHT BEARING RESTRICTIONS: No  PAIN:  Are you having pain? 3-4/10 after the stretches   FALLS: Has patient fallen in last 6 months? No  LIVING ENVIRONMENT: Lives with: lives with their family and lives with their spouse   PLOF: Work on [employer], gym 3 x wk , do own cooking and housework  PATIENT GOALS: Get my motion and strength back in the right hand so that I can carry and grip and hold objects  NEXT MD VISIT: 11th Nov OBJECTIVE:   HAND DOMINANCE: Right   UPPER EXTREMITY ROM:     Active ROM Right eval Left eval R  04/29/23 05/03/23 05/16/23 R 9/16/24R 06/02/23 R R 07/08/23 R  07/18/23  Shoulder flexion           Shoulder abduction           Shoulder adduction           Shoulder extension           Shoulder internal rotation           Shoulder external rotation           Elbow flexion           Elbow extension           Wrist flexion 35 110 50 50 63 72 80 60 76  Wrist extension 48 85 60 55 65 70 70 50 67  Wrist ulnar deviation 24      35 30   Wrist radial deviation 20      25 20    Wrist pronation           Wrist supination           (Blank rows = not tested)  Active ROM Right eval Left eval R   04/29/23 R  05/12/23 R  05/16/23 R 05/23/23 R 06/02/23 R 06/09/23 R 07/08/23 R 07/13/23  In session R 07/22/23  Thumb MCP (0-60) 70            Thumb IP (0-80) 35            Thumb Radial abd/add (0-55) 45    50  50 50      Thumb Palmar abd/add (0-45) 45    50  65 55      Thumb Opposition to Small Finger Opposition to 3rd, 4th and 5th     Opposition to all digits   Opposition to base of  5th       Index MCP (0-90) 30   50 60 60( 65 66 70 70 75 75  Index PIP (0-100) 45( ext -30   55 ( ext -20 60 ( ext -10) 60 ( blocked 70) 60 66 70 50 70 70  Index DIP (0-70)       50        Long MCP (0-90)  45   50 60 65 70 70 75 ( 80 in session)  Ext -50 flexion65 80 75  Long PIP (0-100)  90( ext -20   95 ( ext -15 95 ( ext -5)  100 100 100 90 80 95  Long DIP (0-70)               Ring MCP (0-90)  45   60 70 75 75 80 80( 90 in session Ext -45 flexion 70 90 90  Ring PIP (0-100)  100 (ext -10   100( ext 0  100 - full ext  100 100 100 100 95 95  Ring DIP (0-70)               Little MCP (0-90)  75   75 80  80 85 85( 90 in session) 65 90 85  Little PIP (0-100)  90 (ext 0   100 ( ext 0 100- full ext   100 100 100 10 95 100  Little DIP (0-70)               (Blank rows = not tested) I  05/23/23 GRIP R 14 lbs L 55 lbs, lat pinch R 4 lbs L 14 lbs and 3 point pinch R 5 lbs and L 10 lbs   05/30/23: Lat  grip R 7 lbs , 14 L - 3 point R 6lbs , L 10 lbs   06/07/23 GRIP R 15 lbs L 55 lbs, lat pinch R 7 lbs L 14 lbs and 3 point pinch R 8 lbs and L 10 lbs 06/21/23 GRIP R 12 lbs L 48 lbs, lat pinch R 5 lbs L 14 lbs and 3 point pinch R 6 lbs and L 10 lbs 06/25/23 GRIP R 15 lbs L 48 lbs, lat pinch R 6lbs L 14 lbs and 3 point pinch R 8 lbs and L 10 lbs  COORDINATION: Decreasing significantly because of stiffness and scar tissue in the second digit  EDEMA: R hand   COGNITION: Overall cognitive status: Within functional limits for tasks assessed    OBSERVATIONS:  Patient arrived with soft cast on.  Removed easily with no issues.  Xeroform on and applied 2 x 2 gauze under Tubigrip D over hand   TODAY'S TREATMENT:                                                                                                                              DATE:  07/28/23 Verbal order from Dr. Joice Lofts 07/08/23 - 8:33 -done a dorsal capsulectomy with release of radial and ulnar collateral ligaments to the right second and third  metacarpal.  Did not had any tendon involvement.  Can continue with therapy.  Would need some type of static or dynamic splint. Right grip increased to 19 pounds after gentle passive range of motion for composite fist  FLuido done  8 min - decrease  stiffness for wrist and digits AROM in all planes    Cont MC block splint to increase MC flexion in splint to 80-90 flexion -at nighttime.  Patient can take it off over the weekend during the day and see if she can maintain progress.     Cont scar mobs to dorsal scar - pt to do at home - cica scar pad to use night time  Decrease edema coming in - pt started using isotoner glove  2 layers Moldskin inside dorsal hand-based custom MC block splint for cushioning over dorsal hand   3 times a day contrast followed by abduction and adduction of digits-- doing well Metacarpal flexion with IP extension - to about 90  degrees in session - PROM 90 PROM to PIP/DIP flexion- - hold 15 sec x 10 With PROM - keeping MC at 90 - initially tight but increase in session in composite flexion Composite fist ito palm for 3rd thru 5th -2 palm and a placed on hold.  Focusing on maintaining MC 90 Fisting into 2 cm pink foam block focusing on 3rd through 5th composite flexion within 90 flexion at MCP focus on PIP/DIP flexion of 2nd  and 3rd with great progress to be able to do oval with thumb and do flexion stretch using thumb   Traction to wrist gentle with PROM wrist extention and composite flexion -  8 x 30 sec  At table slides 20 reps for patient she was unable to do wall push-up. Prior to AROM and AAROM   Squeezing into putty light blue easy- place and hold composite for 3rd thru 5th into putty  10-12 reps keeping pain under 2/10  cont teal med putty for lat pinch - 2x day  12-15 reps- in short sausage pain free  This date at 3 pound weight for supination pronation support on thigh 12 reps. Followed by 3 pound weight for scapular squeezes, shoulder extension,  overhead punches and elbow curls as well as triceps 2 sets of 12 1 time a day.   PATIENT EDUCATION: Education details: Findings of evaluation and home program Person educated: Patient Education method: Explanation, Demonstration, Tactile cues, Verbal cues, and Handouts Education comprehension: verbalized understanding, returned demonstration, verbal cues required, tactile cues required, and needs further education   GOALS: Goals reviewed with patient? Yes  SHORT TERM GOALS: Target date: 4 wks  Patient to be independent in home program to decrease scar tissue and edema - increased extension and flexion of digits as well as wrist Baseline: Severe stiffness in digits, i 3 days ago surgery stitches in place  this date- Goal status: INITIAL   LONG TERM GOALS: Target date: 8 wks Flexion of right digits improved for patient to touch palm to initiate use in ADLs with no increase symptoms Baseline: Surgery done 3 days ago -capsulectomy -MC flexion 65 to 70 degrees coming in.  PIP 5200 degrees.   Goal status: INITIAL   2.  Patient increase extension of right digits 2nd thru 4th to be able to don gloves, wash face and reach aroundcylinder objects without issues Baseline: 3 days ago patient had capsulectomy.  Extension of metacarpals -50-second and -40 degrees at third Goal status: INITIAL   3.  Scar tissue improved for patient to be able to make composite fist as well as increase extension to don gloves and put hand in pocket Baseline: 3 days ago had surgery.  Stitches in place.  Decreased flexion and extension with increased edema  goal status: INITIAL   4.  Right grip and prehension strength increased to more than 75% compared to the left for patient to hold weight to work out and cook Baseline: 3 days postop from capsulectomy with ligament release on second and third metacarpal no strength  tested Goal status: INITIAL   5.  R wrist AROM increase to WNL to push and pull door open without  increase symptoms  Baseline:  wrist flxion 60, ext 50 Goal status: INITIAL  ASSESSMENT:  CLINICAL IMPRESSION: Patient presents at occupational therapy evaluation 3 days postop capsulectomy of right second and third metacarpal with release of collateral ligaments.  Patient arrived with soft cast in place.  Patient had earlier this year multiple irrigation and debridements involving right dorsal and volar hand wounds -with abscess and infection.  Patient was seen by therapy in September.  But limited by scar tissue and joint capsule tightness.   NOW pt with  great progress in [facility] and PIP flexion 3rh thru 5th - MC80-85 and PIP 100 - MC flexion 75 to 85 and PIP 70-80 degrees - tenderness mostly at 2nd [facility] - pt can stop wearing over the weekend daytime her  hand-based MC flexion splint- MC flexion to 80-90  but still to wear night time  Show decrease edema using isotoner glove and keep pain under 2/10 with A/PROM.  Focus on PIP/DIP flexion  and then  into composite adding MC - focus on place and hold for 3rd thru 5th to palm -cont light blue putty for composite gripping -  Grip increase to 19 lbs today - good progress - Patient limited in functional use of right dominant hand.  Patient can benefit from skilled OT services to decrease edema, scar tissue and increase range of motion in flexion and extension of digits as well as wrist, increase strength to return to prior level of function. PERFORMANCE DEFICITS: in functional skills including ADLs, IADLs, coordination, dexterity, edema, ROM, strength, pain, flexibility, and UE functional use,   and psychosocial skills including habits and routines and behaviors.   IMPAIRMENTS: are limiting patient from ADLs, IADLs, rest and sleep, work, play, leisure, and social participation.   COMORBIDITIES: has no other co-morbidities that affects occupational performance. Patient will benefit from skilled OT to address above impairments and improve overall  function.  MODIFICATION OR ASSISTANCE TO COMPLETE EVALUATION: No modification of tasks or assist necessary to complete an evaluation.  OT OCCUPATIONAL PROFILE AND HISTORY: Problem focused assessment: Including review of records relating to presenting problem.  CLINICAL DECISION MAKING: LOW - limited treatment options, no task modification necessary  REHAB POTENTIAL: Good  EVALUATION COMPLEXITY: Low  PLAN:  OT FREQUENCY: 3 x wk initially - decrease depending on progress  OT DURATION: 8 weeks  PLANNED INTERVENTIONS: ADL's therapeutic exercise, therapeutic activity, manual therapy, scar mobilization, passive range of motion, splinting, paraffin, fluidotherapy, contrast bath, patient/family education, and DME and/or AE instructions  CONSULTED AND AGREED WITH PLAN OF CARE: Patient   Oletta Cohn, OTR/L,CLT 07/29/2023, 11:26 AM

## 2023-08-01 ENCOUNTER — Ambulatory Visit: Payer: No Typology Code available for payment source | Admitting: Occupational Therapy

## 2023-08-01 DIAGNOSIS — L905 Scar conditions and fibrosis of skin: Secondary | ICD-10-CM

## 2023-08-01 DIAGNOSIS — R600 Localized edema: Secondary | ICD-10-CM

## 2023-08-01 DIAGNOSIS — M25631 Stiffness of right wrist, not elsewhere classified: Secondary | ICD-10-CM

## 2023-08-01 DIAGNOSIS — M6281 Muscle weakness (generalized): Secondary | ICD-10-CM

## 2023-08-01 DIAGNOSIS — M25641 Stiffness of right hand, not elsewhere classified: Secondary | ICD-10-CM

## 2023-08-01 NOTE — Therapy (Signed)
OUTPATIENT OCCUPATIONAL THERAPY ORTHO  TREATMENT  Patient Name: Wanda Smith MRN: [id number] DOB:[birth year], 62 y.o., female   PCP: Merlinda Frederick NP REFERRING PROVIDER: Marney Doctor PA  END OF SESSION:  OT End of Session - 08/01/23 0909     Visit Number 25    Number of Visits 38    Date for OT Re-Evaluation 09/26/23    OT Start Time 0900    OT Stop Time 0941    OT Time Calculation (min) 41 min    Activity Tolerance Patient tolerated treatment well    Behavior During Therapy [facility] for tasks assessed/performed             Past Medical History:  Diagnosis Date   Anemia    Anxiety    Arthritis    knee (R)   Asthma    Carpal tunnel syndrome    Cellulitis of right hand 03/30/2023   Chronic kidney disease, stage 3a (HCC)    Dr Cherylann Ratel   COPD (chronic obstructive pulmonary disease) (HCC)    Depression    Diabetes mellitus without complication (HCC)    Dyspnea    GERD (gastroesophageal reflux disease)    occ   Hyperlipidemia    Hypertension    Hypothyroidism    Increased serum lipids    MRSA infection 03/30/2023   right hand   Osteoporosis    Severe sepsis (HCC) 03/30/2023   Type 2 diabetes mellitus with renal manifestations (HCC)    Wears contact lenses    Past Surgical History:  Procedure Laterality Date   ABDOMINAL HYSTERECTOMY  1998   CARPAL TUNNEL RELEASE Right 07/26/2018   Procedure: CARPAL TUNNEL RELEASE ENDOSCOPIC;  Surgeon: Christena Flake, MD;  Location: [facility];  Service: Orthopedics;  Laterality: Right;  diabetic - oral meds   CHOLECYSTECTOMY  2013   CLOSED REDUCTION METACARPAL WITH PERCUTANEOUS PINNING Right 07/05/2023   Procedure: MANIPULATION UNDER ANESTHESIA OF RIGHT INDEX AND LONG FINGERS WITH POSSIBLE CAPSULAR OF RIGHT INDEX AND LONG MCP JOINTS;  Surgeon: Christena Flake, MD;  Location: [facility];  Service: Orthopedics;  Laterality: Right;   COLONOSCOPY WITH ESOPHAGOGASTRODUODENOSCOPY (EGD)  03/03/2012   COLONOSCOPY WITH  ESOPHAGOGASTRODUODENOSCOPY (EGD)  02/23/2022   COLONOSCOPY WITH ESOPHAGOGASTRODUODENOSCOPY (EGD)  01/16/1991   COLONOSCOPY WITH PROPOFOL N/A 01/13/2018   Procedure: COLONOSCOPY WITH PROPOFOL;  Surgeon: Scot Jun, MD;  Location: [facility];  Service: Endoscopy;  Laterality: N/A;   HERNIA REPAIR  1966   I & D EXTREMITY Right 04/01/2023   Procedure: IRRIGATION AND DEBRIDEMENT EXTREMITY PREVENA PLACEMENT;  Surgeon: Christena Flake, MD;  Location: [facility];  Service: Orthopedics;  Laterality: Right;   I-131 THYROID ABLATION     INCISION AND DRAINAGE ABSCESS Right 03/30/2023   Procedure: INCISION AND DRAINAGE ABSCESS RIGHT HAND;  Surgeon: Christena Flake, MD;  Location: [facility];  Service: Orthopedics;  Laterality: Right;   INCISION AND DRAINAGE OF WOUND Right 04/04/2023   Procedure: IRRIGATION AND DEBRIDEMENT RIGHT HAND DORSAL AND PALMAR INCISIONS WITH DELAYED PRIMARY CLOSURE;  Surgeon: Christena Flake, MD;  Location: [facility];  Service: Orthopedics;  Laterality: Right;   ROTATOR CUFF REPAIR Left 2011   SHOULDER ARTHROSCOPY WITH SUBACROMIAL DECOMPRESSION, ROTATOR CUFF REPAIR AND BICEP TENDON REPAIR Right 10/21/2021   Procedure: SHOULDER ARTHROSCOPY WITH DEBRIDEMENT, DECOMPRESSION, BICEPS TENODESIS.;  Surgeon: Christena Flake, MD;  Location: [facility];  Service: Orthopedics;  Laterality: Right;   TUBAL LIGATION  1993   Patient Active Problem List   Diagnosis Date  Noted   MRSA infection 04/04/2023   Abscess of right hand 03/31/2023   Cellulitis of right hand 03/30/2023   Depression with anxiety 03/30/2023   HLD (hyperlipidemia) 03/30/2023   Type II diabetes mellitus with renal manifestations (HCC) 03/30/2023   Chronic kidney disease, stage 3a (HCC) 03/30/2023   Abnormal LFTs 03/30/2023   Severe sepsis (HCC) 03/30/2023   Hypothyroidism    Hypertension    COPD (chronic obstructive pulmonary disease) (HCC)     ONSET DATE: 03/30/23  REFERRING DIAG:Abscess of R hand -3 irrigation  surgeries/Capsulectomy 2nd and 3rd MC - release of collateral ligaments  THERAPY DIAG:  Scar condition and fibrosis of skin  Localized edema  Muscle weakness (generalized)  Stiffness of right hand, not elsewhere classified  Stiffness of right wrist, not elsewhere classified  Rationale for Evaluation and Treatment: Rehabilitation  SUBJECTIVE:   SUBJECTIVE STATEMENT: Using my hand more.  Have some weakness with like holding the pot  in hand and lifting and pouring.  Still is not just the grip but my forearm. pt accompanied by: self  PERTINENT HISTORY:  Ortho note 04/20/23 Wanda Smith is a 62 y.o. female who presents today for a skin check status post multiple irrigation and debridements involving right dorsal and volar hand wounds and possible suture removal. The patient was admitted to the hospital after suffering a dog scratch which eventually became more swollen and painful. The patient underwent a CT scan of the right hand which demonstrated a abscess formation along the volar aspect of the hand initially. She underwent 1 irrigation debridement however the patient continued to have swelling and pain prompting a repeat CT scan which demonstrated abscess formation along the dorsal aspect of the hand. The patient underwent a second irrigation debridement and a final third procedure performed by Dr. Joice Lofts on 04/04/2023. The patient was evaluated last week where skin inspection demonstrated excellent granulation without any significant erythema or purulent drainage. The patient then had a follow-up with infectious disease where repeat CRP came back at a normal level. The patient does still have a oral prescription for linezolid which she is scheduled to take until end this week. She denies any trauma or injury affecting the right hand since her last evaluation. She does report an occasional burning discomfort in the index finger. She is still taking occasional oxycodone. She reports a 0 out  of 10 pain score at today's visit. Stitches remove- and to finish antibiotics and refer to OT. ON 07/05/2023 - Pt had by Dr Joice Lofts   Attempted closed manipulation under anesthesia,  then had open capsulectomies with release of radial and ulnar collateral ligaments of both index and long finger MCP joints.- refer to OT to start ROM , splinting and tx 3 days later    PRECAUTIONS: None     WEIGHT BEARING RESTRICTIONS: No  PAIN:  Are you having pain? 3-4/10 after the stretches   FALLS: Has patient fallen in last 6 months? No  LIVING ENVIRONMENT: Lives with: lives with their family and lives with their spouse   PLOF: Work on [employer], gym 3 x wk , do own cooking and housework  PATIENT GOALS: Get my motion and strength back in the right hand so that I can carry and grip and hold objects  NEXT MD VISIT: middle Dec OBJECTIVE:   HAND DOMINANCE: Right   UPPER EXTREMITY ROM:     Active ROM Right eval Left eval R  04/29/23 05/03/23 05/16/23 R 9/16/24R 06/02/23 R R 07/08/23 R  07/18/23  Shoulder flexion           Shoulder abduction           Shoulder adduction           Shoulder extension           Shoulder internal rotation           Shoulder external rotation           Elbow flexion           Elbow extension           Wrist flexion 35 110 50 50 63 72 80 60 76  Wrist extension 48 85 60 55 65 70 70 50 67  Wrist ulnar deviation 24      35 30   Wrist radial deviation 20      25 20    Wrist pronation           Wrist supination           (Blank rows = not tested)  Active ROM Right eval Left eval R   04/29/23 R  05/12/23 R  05/16/23 R 05/23/23 R 06/02/23 R 06/09/23 R 07/08/23 R 07/13/23  In session R 07/22/23  Thumb MCP (0-60) 70            Thumb IP (0-80) 35            Thumb Radial abd/add (0-55) 45    50  50 50      Thumb Palmar abd/add (0-45) 45    50  65 55      Thumb Opposition to Small Finger Opposition to 3rd, 4th and 5th     Opposition to all digits   Opposition to base of  5th       Index MCP (0-90) 30   50 60 60( 65 66 70 70 75 75  Index PIP (0-100) 45( ext -30   55 ( ext -20 60 ( ext -10) 60 ( blocked 70) 60 66 70 50 70 70  Index DIP (0-70)       50        Long MCP (0-90)  45   50 60 65 70 70 75 ( 80 in session)  Ext -50 flexion65 80 75  Long PIP (0-100)  90( ext -20   95 ( ext -15 95 ( ext -5)  100 100 100 90 80 95  Long DIP (0-70)               Ring MCP (0-90)  45   60 70 75 75 80 80( 90 in session Ext -45 flexion 70 90 90  Ring PIP (0-100)  100 (ext -10   100( ext 0  100 - full ext  100 100 100 100 95 95  Ring DIP (0-70)               Little MCP (0-90)  75   75 80  80 85 85( 90 in session) 65 90 85  Little PIP (0-100)  90 (ext 0   100 ( ext 0 100- full ext   100 100 100 10 95 100  Little DIP (0-70)               (Blank rows = not tested) I  05/23/23 GRIP R 14 lbs L 55 lbs, lat pinch R 4 lbs L 14 lbs and 3 point pinch R 5 lbs and L 10 lbs   05/30/23: Lat  grip R 7 lbs , 14 L - 3 point R 6lbs , L 10 lbs   06/07/23 GRIP R 15 lbs L 55 lbs, lat pinch R 7 lbs L 14 lbs and 3 point pinch R 8 lbs and L 10 lbs 06/21/23 GRIP R 12 lbs L 48 lbs, lat pinch R 5 lbs L 14 lbs and 3 point pinch R 6 lbs and L 10 lbs 06/25/23 GRIP R 15 lbs L 48 lbs, lat pinch R 6lbs L 14 lbs and 3 point pinch R 8 lbs and L 10 lbs  COORDINATION: Decreasing significantly because of stiffness and scar tissue in the second digit  EDEMA: R hand   COGNITION: Overall cognitive status: Within functional limits for tasks assessed    OBSERVATIONS:  Patient arrived with soft cast on.  Removed easily with no issues.  Xeroform on and applied 2 x 2 gauze under Tubigrip D over hand   TODAY'S TREATMENT:                                                                                                                              DATE:  08/01/23 Verbal order from Dr. Joice Lofts 07/08/23 - 8:33 -done a dorsal capsulectomy with release of radial and ulnar collateral ligaments to the right second and third  metacarpal.  Did not had any tendon involvement.  Can continue with therapy.  Would need some type of static or dynamic splint. Right grip increased to 19 pounds after gentle passive range of motion for composite fist  FLuido done  8 min - decrease  stiffness for wrist and digits AROM in all planes    Pt stop wearing daytime MC  block saplint = cont night ime MC block splint to increase MC flexion in splint to 80-90 flexion -at nighttime.   Cont scar mobs to dorsal scar - pt to do at home - cica scar pad to use night time Kinesiotape for daytime use over dorsal scar   Decrease edema coming in - pt started using isotoner glove  2 layers Moldskin inside dorsal hand-based custom MC block splint for cushioning over dorsal hand   3 times a day contrast followed by abduction and adduction of digits-- doing well Metacarpal flexion with IP extension - to about 90  degrees in session - PROM 90 PROM to PIP/DIP flexion- - hold 15 sec x 10 With PROM - keeping MC at 90 - initially tight but increase in session in composite flexion Composite fist ito palm for 3rd thru 5th -2 palm and a placed on hold.  Focusing on maintaining MC 90 Fisting into 2 cm pink foam block focusing on 3rd through 5th composite flexion within 90 flexion at MCP focus on PIP/DIP flexion of 2nd  and 3rd with great progress to be able to do oval with thumb and do flexion stretch using thumb   Traction to wrist gentle with PROM wrist extention and composite flexion -  8 x 30 sec  Cont at home with table slides 20 reps for patient she was unable to do wall push-up. Prior to AROM and AAROM   Squeezing into putty light blue easy- place and hold composite for 3rd thru 5th into putty  10-12 reps keeping pain under 2/10  cont teal med putty for lat pinch  and 3 point pinch - 2x day  12-15 reps- in short sausage pain free  This date review at 3 pound weight for supination pronation support on thigh 12 reps. Followed by 3 pound weight  for scapular squeezes, shoulder extension, overhead punches and elbow curls as well as triceps 2 sets of 12 1 time a day.   PATIENT EDUCATION: Education details: Findings of evaluation and home program Person educated: Patient Education method: Explanation, Demonstration, Tactile cues, Verbal cues, and Handouts Education comprehension: verbalized understanding, returned demonstration, verbal cues required, tactile cues required, and needs further education   GOALS: Goals reviewed with patient? Yes  SHORT TERM GOALS: Target date: 4 wks  Patient to be independent in home program to decrease scar tissue and edema - increased extension and flexion of digits as well as wrist Baseline: Severe stiffness in digits, i 3 days ago surgery stitches in place  this date- Goal status: MET   LONG TERM GOALS: Target date: 8 wks Flexion of right digits improved for patient to touch palm to initiate use in ADLs with no increase symptoms Baseline: Surgery done 3 days ago -capsulectomy -MC flexion 65 to 70 degrees coming in.  PIP 52 degrees.   NOW 3rd thru 5th touching palm Goal status: progressing    2.  Patient increase extension of right digits 2nd thru 4th to be able to don gloves, wash face and reach aroundcylinder objects without issues Baseline: 3 days ago patient had capsulectomy.  Extension of metacarpals -50-second and -40 degrees at third NOW improved greatly -still not able to lay hand flat or do wall pushup Goal status: MET   3.  Scar tissue improved for patient to be able to make composite fist as well as increase extension to don gloves and put hand in pocket Baseline: 3 days ago had surgery.  Stitches in place.  Decreased flexion and extension with increased edema  goal status:progressing    4.  Right grip and prehension strength increased to more than 75% compared to the left for patient to hold weight to work out and cook Baseline: 3 days postop from capsulectomy with ligament release  on second and third metacarpal no strength  tested Goal status: progressing   5.  R wrist AROM increase to WNL to push and pull door open without increase symptoms  Baseline:  wrist flxion 60, ext 50 Goal status: progressing   ASSESSMENT:  CLINICAL IMPRESSION: Patient presents at occupational therapy evaluation 3 days postop capsulectomy of right second and third metacarpal with release of collateral ligaments.  Patient arrived with soft cast in place.  Patient had earlier this year multiple irrigation and debridements involving right dorsal and volar hand wounds -with abscess and infection.  Patient was seen by therapy in September.  But limited by scar tissue and joint capsule tightness.   NOW pt with  great progress in [facility] and PIP flexion 3rh thru 5th - MC80-85 and PIP 100 - MC flexion 75 to 85 and PIP 70-80 degrees - tenderness mostly at 2nd [facility] -pt was able to maintain proress without wearing daytime  hand-based MC flexion splint-MC flexion to 80-90  but still to wear night time  Show decrease edema using isotoner glove and keep pain under 2/10 with A/PROM.  Focus on PIP/DIP flexion  and then into composite adding MC - focus on place and hold for 3rd thru 5th to palm -cont light blue putty for composite gripping -  Grip increase to 19 lbs , lat pinch 7lbs and 3 point was 8lbs  - good progress - Patient limited in functional use of right dominant hand.  Patient can benefit from skilled OT services to decrease edema, scar tissue and increase range of motion in flexion and extension of digits as well as wrist, increase strength to return to prior level of function. PERFORMANCE DEFICITS: in functional skills including ADLs, IADLs, coordination, dexterity, edema, ROM, strength, pain, flexibility, and UE functional use,   and psychosocial skills including habits and routines and behaviors.   IMPAIRMENTS: are limiting patient from ADLs, IADLs, rest and sleep, work, play, leisure, and social participation.    COMORBIDITIES: has no other co-morbidities that affects occupational performance. Patient will benefit from skilled OT to address above impairments and improve overall function.  MODIFICATION OR ASSISTANCE TO COMPLETE EVALUATION: No modification of tasks or assist necessary to complete an evaluation.  OT OCCUPATIONAL PROFILE AND HISTORY: Problem focused assessment: Including review of records relating to presenting problem.  CLINICAL DECISION MAKING: LOW - limited treatment options, no task modification necessary  REHAB POTENTIAL: Good  EVALUATION COMPLEXITY: Low  PLAN:  OT FREQUENCY: 3 x wk initially - decrease depending on progress  OT DURATION: 8 weeks  PLANNED INTERVENTIONS: ADL's therapeutic exercise, therapeutic activity, manual therapy, scar mobilization, passive range of motion, splinting, paraffin, fluidotherapy, contrast bath, patient/family education, and DME and/or AE instructions  CONSULTED AND AGREED WITH PLAN OF CARE: Patient   Oletta Cohn, OTR/L,CLT 08/01/2023, 1:28 PM

## 2023-08-02 ENCOUNTER — Ambulatory Visit: Payer: No Typology Code available for payment source | Admitting: Occupational Therapy

## 2023-08-02 DIAGNOSIS — M25631 Stiffness of right wrist, not elsewhere classified: Secondary | ICD-10-CM

## 2023-08-02 DIAGNOSIS — R600 Localized edema: Secondary | ICD-10-CM

## 2023-08-02 DIAGNOSIS — M25641 Stiffness of right hand, not elsewhere classified: Secondary | ICD-10-CM

## 2023-08-02 DIAGNOSIS — L905 Scar conditions and fibrosis of skin: Secondary | ICD-10-CM

## 2023-08-02 DIAGNOSIS — M6281 Muscle weakness (generalized): Secondary | ICD-10-CM

## 2023-08-02 NOTE — Therapy (Signed)
OUTPATIENT OCCUPATIONAL THERAPY ORTHO  TREATMENT  Patient Name: Wanda Smith MRN: [id number] DOB:May 28, 1961, 62 y.o., female   PCP: Merlinda Frederick NP REFERRING PROVIDER: Marney Doctor PA  END OF SESSION:  OT End of Session - 08/02/23 0836     Visit Number 26    Number of Visits 38    Date for OT Re-Evaluation 09/26/23    OT Start Time 0818    OT Stop Time 0858    OT Time Calculation (min) 40 min    Activity Tolerance Patient tolerated treatment well    Behavior During Therapy [facility] for tasks assessed/performed             Past Medical History:  Diagnosis Date   Anemia    Anxiety    Arthritis    knee (R)   Asthma    Carpal tunnel syndrome    Cellulitis of right hand 03/30/2023   Chronic kidney disease, stage 3a (HCC)    Dr Cherylann Ratel   COPD (chronic obstructive pulmonary disease) (HCC)    Depression    Diabetes mellitus without complication (HCC)    Dyspnea    GERD (gastroesophageal reflux disease)    occ   Hyperlipidemia    Hypertension    Hypothyroidism    Increased serum lipids    MRSA infection 03/30/2023   right hand   Osteoporosis    Severe sepsis (HCC) 03/30/2023   Type 2 diabetes mellitus with renal manifestations (HCC)    Wears contact lenses    Past Surgical History:  Procedure Laterality Date   ABDOMINAL HYSTERECTOMY  1998   CARPAL TUNNEL RELEASE Right 07/26/2018   Procedure: CARPAL TUNNEL RELEASE ENDOSCOPIC;  Surgeon: Christena Flake, MD;  Location: [facility];  Service: Orthopedics;  Laterality: Right;  diabetic - oral meds   CHOLECYSTECTOMY  2013   CLOSED REDUCTION METACARPAL WITH PERCUTANEOUS PINNING Right 07/05/2023   Procedure: MANIPULATION UNDER ANESTHESIA OF RIGHT INDEX AND LONG FINGERS WITH POSSIBLE CAPSULAR OF RIGHT INDEX AND LONG MCP JOINTS;  Surgeon: Christena Flake, MD;  Location: [facility];  Service: Orthopedics;  Laterality: Right;   COLONOSCOPY WITH ESOPHAGOGASTRODUODENOSCOPY (EGD)  03/03/2012   COLONOSCOPY WITH  ESOPHAGOGASTRODUODENOSCOPY (EGD)  02/23/2022   COLONOSCOPY WITH ESOPHAGOGASTRODUODENOSCOPY (EGD)  01/16/1991   COLONOSCOPY WITH PROPOFOL N/A 01/13/2018   Procedure: COLONOSCOPY WITH PROPOFOL;  Surgeon: Scot Jun, MD;  Location: [facility];  Service: Endoscopy;  Laterality: N/A;   HERNIA REPAIR  1966   I & D EXTREMITY Right 04/01/2023   Procedure: IRRIGATION AND DEBRIDEMENT EXTREMITY PREVENA PLACEMENT;  Surgeon: Christena Flake, MD;  Location: [facility];  Service: Orthopedics;  Laterality: Right;   I-131 THYROID ABLATION     INCISION AND DRAINAGE ABSCESS Right 03/30/2023   Procedure: INCISION AND DRAINAGE ABSCESS RIGHT HAND;  Surgeon: Christena Flake, MD;  Location: [facility];  Service: Orthopedics;  Laterality: Right;   INCISION AND DRAINAGE OF WOUND Right 04/04/2023   Procedure: IRRIGATION AND DEBRIDEMENT RIGHT HAND DORSAL AND PALMAR INCISIONS WITH DELAYED PRIMARY CLOSURE;  Surgeon: Christena Flake, MD;  Location: [facility];  Service: Orthopedics;  Laterality: Right;   ROTATOR CUFF REPAIR Left 2011   SHOULDER ARTHROSCOPY WITH SUBACROMIAL DECOMPRESSION, ROTATOR CUFF REPAIR AND BICEP TENDON REPAIR Right 10/21/2021   Procedure: SHOULDER ARTHROSCOPY WITH DEBRIDEMENT, DECOMPRESSION, BICEPS TENODESIS.;  Surgeon: Christena Flake, MD;  Location: [facility];  Service: Orthopedics;  Laterality: Right;   TUBAL LIGATION  1993   Patient Active Problem List   Diagnosis Date  Noted   MRSA infection 04/04/2023   Abscess of right hand 03/31/2023   Cellulitis of right hand 03/30/2023   Depression with anxiety 03/30/2023   HLD (hyperlipidemia) 03/30/2023   Type II diabetes mellitus with renal manifestations (HCC) 03/30/2023   Chronic kidney disease, stage 3a (HCC) 03/30/2023   Abnormal LFTs 03/30/2023   Severe sepsis (HCC) 03/30/2023   Hypothyroidism    Hypertension    COPD (chronic obstructive pulmonary disease) (HCC)     ONSET DATE: 03/30/23  REFERRING DIAG:Abscess of R hand -3 irrigation  surgeries/Capsulectomy 2nd and 3rd MC - release of collateral ligaments  THERAPY DIAG:  Scar condition and fibrosis of skin  Localized edema  Muscle weakness (generalized)  Stiffness of right hand, not elsewhere classified  Stiffness of right wrist, not elsewhere classified  Rationale for Evaluation and Treatment: Rehabilitation  SUBJECTIVE:   SUBJECTIVE STATEMENT: My fist is better- using it more - it was sore after yesterday's session- picking up heavy things like pots and pouring still hard and small things like small buttons  pt accompanied by: self  PERTINENT HISTORY:  Ortho note 04/20/23 Wanda Smith is a 62 y.o. female who presents today for a skin check status post multiple irrigation and debridements involving right dorsal and volar hand wounds and possible suture removal. The patient was admitted to the hospital after suffering a dog scratch which eventually became more swollen and painful. The patient underwent a CT scan of the right hand which demonstrated a abscess formation along the volar aspect of the hand initially. She underwent 1 irrigation debridement however the patient continued to have swelling and pain prompting a repeat CT scan which demonstrated abscess formation along the dorsal aspect of the hand. The patient underwent a second irrigation debridement and a final third procedure performed by Dr. Joice Lofts on 04/04/2023. The patient was evaluated last week where skin inspection demonstrated excellent granulation without any significant erythema or purulent drainage. The patient then had a follow-up with infectious disease where repeat CRP came back at a normal level. The patient does still have a oral prescription for linezolid which she is scheduled to take until end this week. She denies any trauma or injury affecting the right hand since her last evaluation. She does report an occasional burning discomfort in the index finger. She is still taking occasional  oxycodone. She reports a 0 out of 10 pain score at today's visit. Stitches remove- and to finish antibiotics and refer to OT. ON 07/05/2023 - Pt had by Dr Joice Lofts   Attempted closed manipulation under anesthesia,  then had open capsulectomies with release of radial and ulnar collateral ligaments of both index and long finger MCP joints.- refer to OT to start ROM , splinting and tx 3 days later    PRECAUTIONS: None     WEIGHT BEARING RESTRICTIONS: No  PAIN:  Are you having pain? 3-4/10 after the stretches   FALLS: Has patient fallen in last 6 months? No  LIVING ENVIRONMENT: Lives with: lives with their family and lives with their spouse   PLOF: Work on [employer], gym 3 x wk , do own cooking and housework  PATIENT GOALS: Get my motion and strength back in the right hand so that I can carry and grip and hold objects  NEXT MD VISIT: middle Dec OBJECTIVE:   HAND DOMINANCE: Right   UPPER EXTREMITY ROM:     Active ROM Right eval Left eval R  04/29/23 05/03/23 05/16/23 R 9/16/24R 06/02/23 R R 07/08/23  R 07/18/23 R 08/02/23  Shoulder flexion            Shoulder abduction            Shoulder adduction            Shoulder extension            Shoulder internal rotation            Shoulder external rotation            Elbow flexion            Elbow extension            Wrist flexion 35 110 50 50 63 72 80 60 76   Wrist extension 48 85 60 55 65 70 70 50 67 78   Wrist ulnar deviation 24      35 30    Wrist radial deviation 20      25 20     Wrist pronation            Wrist supination            (Blank rows = not tested)  Active ROM Right eval Left eval R   04/29/23 R  05/12/23 R  05/16/23 R 05/23/23 R 06/02/23 R 06/09/23 R 07/08/23 R 07/13/23  In session R 07/22/23 R 08/02/23  Thumb MCP (0-60) 70             Thumb IP (0-80) 35             Thumb Radial abd/add (0-55) 45    50  50 50       Thumb Palmar abd/add (0-45) 45    50  65 55       Thumb Opposition to Small Finger  Opposition to 3rd, 4th and 5th     Opposition to all digits   Opposition to base of 5th        Index MCP (0-90) 30   50 60 60( 65 66 70 70 75 75 75  Index PIP (0-100) 45( ext -30   55 ( ext -20 60 ( ext -10) 60 ( blocked 70) 60 66 70 50 70 70 70  Index DIP (0-70)       50         Long MCP (0-90)  45   50 60 65 70 70 75 ( 80 in session)  Ext -50 flexion65 80 75 80  Long PIP (0-100)  90( ext -20   95 ( ext -15 95 ( ext -5)  100 100 100 90 80 95 100  Long DIP (0-70)                Ring MCP (0-90)  45   60 70 75 75 80 80( 90 in session Ext -45 flexion 70 90 90 85  Ring PIP (0-100)  100 (ext -10   100( ext 0  100 - full ext  100 100 100 100 95 95 100  Ring DIP (0-70)                Little MCP (0-90)  75   75 80  80 85 85( 90 in session) 65 90 85 85  Little PIP (0-100)  90 (ext 0   100 ( ext 0 100- full ext   100 100 100 10 95 100 100  Little DIP (0-70)                (Blank rows =  not tested) I  05/23/23 GRIP R 14 lbs L 55 lbs, lat pinch R 4 lbs L 14 lbs and 3 point pinch R 5 lbs and L 10 lbs   05/30/23: Lat grip R 7 lbs , 14 L - 3 point R 6lbs , L 10 lbs   06/07/23 GRIP R 15 lbs L 55 lbs, lat pinch R 7 lbs L 14 lbs and 3 point pinch R 8 lbs and L 10 lbs 06/21/23 GRIP R 12 lbs L 48 lbs, lat pinch R 5 lbs L 14 lbs and 3 point pinch R 6 lbs and L 10 lbs 06/25/23 GRIP R 15 lbs L 48 lbs, lat pinch R 6lbs L 14 lbs and 3 point pinch R 8 lbs and L 10 lbs 08/02/23 GRIP R 19 lbs   COORDINATION: Decreasing significantly because of stiffness and scar tissue in the second digit  EDEMA: R hand   COGNITION: Overall cognitive status: Within functional limits for tasks assessed    OBSERVATIONS:  Patient arrived with soft cast on.  Removed easily with no issues.  Xeroform on and applied 2 x 2 gauze under Tubigrip D over hand   TODAY'S TREATMENT:                                                                                                                              DATE:  08/02/23 Verbal order  from Dr. Joice Lofts 07/08/23 - 8:33 -done a dorsal capsulectomy with release of radial and ulnar collateral ligaments to the right second and third metacarpal.  Did not had any tendon involvement.  Can continue with therapy.  Would need some type of static or dynamic splint.   FLuido done  8 min - decrease  stiffness for wrist and digits AROM in all planes    Pt stop wearing daytime MC  block splint last Friday -cont night ime MC block splint to increase MC flexion in splint to 80-90 flexion -at nighttime.   Cont scar mobs to dorsal scar - pt to do at home - cica scar pad to use night time   Decrease edema coming in - pt started using isotoner glove last week 2 layers Moldskin inside dorsal hand-based custom MC block splint for cushioning over dorsal hand   2  times a day contrast followed by abduction and adduction of digits-- doing well Metacarpal flexion with IP extension - to about 90  degrees in session - PROM 90 PROM to PIP/DIP flexion- - hold 15 sec x 10 With PROM - keeping MC at 90 - initially tight but increase in session in composite flexion Composite fist ito palm for 3rd thru 5th -2 palm and a placed on hold.  Focusing on maintaining MC 90 Focus on place and hold into palm for 3rd thru 5th   Traction to wrist gentle with PROM wrist extention and composite flexion -  8 x 30 sec  Cont at home with table slides  20 reps for patient she was unable to do wall push-up. Prior to AROM and AAROM   Squeezing into putty light blue easy- place and hold composite for 3rd thru 5th into putty  10-12 reps keeping pain under 2/10  cont teal med putty for lat pinch  and 3 point pinch - 2x day  12-15 reps- in short sausage pain free  Add precision pinch into teal putty retrieving 1 cm small objects- - 2-3 x day - 8-10 objects   cont 3 pound weight for supination pronation support on thigh 12 reps. Followed by 3 pound weight for scapular squeezes, shoulder extension, overhead punches and elbow  curls as well as triceps 2 sets of 12 1 time a day.   PATIENT EDUCATION: Education details: Findings of evaluation and home program Person educated: Patient Education method: Explanation, Demonstration, Tactile cues, Verbal cues, and Handouts Education comprehension: verbalized understanding, returned demonstration, verbal cues required, tactile cues required, and needs further education   GOALS: Goals reviewed with patient? Yes  SHORT TERM GOALS: Target date: 4 wks  Patient to be independent in home program to decrease scar tissue and edema - increased extension and flexion of digits as well as wrist Baseline: Severe stiffness in digits, i 3 days ago surgery stitches in place  this date- Goal status: MET   LONG TERM GOALS: Target date: 8 wks Flexion of right digits improved for patient to touch palm to initiate use in ADLs with no increase symptoms Baseline: Surgery done 3 days ago -capsulectomy -MC flexion 65 to 70 degrees coming in.  PIP 52 degrees.   NOW 3rd thru 5th touching palm Goal status: progressing    2.  Patient increase extension of right digits 2nd thru 4th to be able to don gloves, wash face and reach aroundcylinder objects without issues Baseline: 3 days ago patient had capsulectomy.  Extension of metacarpals -50-second and -40 degrees at third NOW improved greatly -still not able to lay hand flat or do wall pushup Goal status: MET   3.  Scar tissue improved for patient to be able to make composite fist as well as increase extension to don gloves and put hand in pocket Baseline: 3 days ago had surgery.  Stitches in place.  Decreased flexion and extension with increased edema  goal status:progressing    4.  Right grip and prehension strength increased to more than 75% compared to the left for patient to hold weight to work out and cook Baseline: 3 days postop from capsulectomy with ligament release on second and third metacarpal no strength  tested Goal status:  progressing   5.  R wrist AROM increase to WNL to push and pull door open without increase symptoms  Baseline:  wrist flxion 60, ext 50 Goal status: progressing   ASSESSMENT:  CLINICAL IMPRESSION: Patient presents at occupational therapy evaluation 3 days postop capsulectomy of right second and third metacarpal with release of collateral ligaments.  Patient arrived with soft cast in place.  Patient had earlier this year multiple irrigation and debridements involving right dorsal and volar hand wounds -with abscess and infection.  Patient was seen by therapy in September.  But limited by scar tissue and joint capsule tightness.   NOW pt with  great progress in [facility] and PIP flexion 3rh thru 5th - MC80-85 and PIP 100 -  2nd MC flexion 75 to 85 and PIP 70-80 degrees - tenderness mostly at 2nd [facility] -pt was able to maintain progress without wearing daytime  hand-based MC flexion splint-MC flexion to 80-90  but still to wear night time  Show decrease edema using isotoner glove and keep pain under 2/10 with A/PROM.  Focus on PIP/DIP flexion  and then into composite adding MC - focus on place and hold for 3rd thru 5th to palm -cont light blue putty for composite gripping -  Grip increase to 19 lbs , lat pinch 7lbs and 3 point was 8lbs  - good progress - Patient limited in functional use of right dominant hand.  Patient can benefit from skilled OT services to decrease edema, scar tissue and increase range of motion in flexion and extension of digits as well as wrist, increase strength to return to prior level of function. PERFORMANCE DEFICITS: in functional skills including ADLs, IADLs, coordination, dexterity, edema, ROM, strength, pain, flexibility, and UE functional use,   and psychosocial skills including habits and routines and behaviors.   IMPAIRMENTS: are limiting patient from ADLs, IADLs, rest and sleep, work, play, leisure, and social participation.   COMORBIDITIES: has no other co-morbidities that affects  occupational performance. Patient will benefit from skilled OT to address above impairments and improve overall function.  MODIFICATION OR ASSISTANCE TO COMPLETE EVALUATION: No modification of tasks or assist necessary to complete an evaluation.  OT OCCUPATIONAL PROFILE AND HISTORY: Problem focused assessment: Including review of records relating to presenting problem.  CLINICAL DECISION MAKING: LOW - limited treatment options, no task modification necessary  REHAB POTENTIAL: Good  EVALUATION COMPLEXITY: Low  PLAN:  OT FREQUENCY: 3 x wk initially - decrease depending on progress  OT DURATION: 8 weeks  PLANNED INTERVENTIONS: ADL's therapeutic exercise, therapeutic activity, manual therapy, scar mobilization, passive range of motion, splinting, paraffin, fluidotherapy, contrast bath, patient/family education, and DME and/or AE instructions  CONSULTED AND AGREED WITH PLAN OF CARE: Patient   Oletta Cohn, OTR/L,CLT 08/02/2023, 9:30 AM

## 2023-08-09 ENCOUNTER — Ambulatory Visit: Payer: No Typology Code available for payment source | Attending: Student | Admitting: Occupational Therapy

## 2023-08-09 DIAGNOSIS — M25641 Stiffness of right hand, not elsewhere classified: Secondary | ICD-10-CM | POA: Diagnosis present

## 2023-08-09 DIAGNOSIS — M6281 Muscle weakness (generalized): Secondary | ICD-10-CM | POA: Insufficient documentation

## 2023-08-09 DIAGNOSIS — M25631 Stiffness of right wrist, not elsewhere classified: Secondary | ICD-10-CM | POA: Insufficient documentation

## 2023-08-09 DIAGNOSIS — R600 Localized edema: Secondary | ICD-10-CM | POA: Diagnosis present

## 2023-08-09 DIAGNOSIS — L905 Scar conditions and fibrosis of skin: Secondary | ICD-10-CM | POA: Insufficient documentation

## 2023-08-11 ENCOUNTER — Ambulatory Visit: Payer: No Typology Code available for payment source | Admitting: Occupational Therapy

## 2023-08-11 DIAGNOSIS — M25641 Stiffness of right hand, not elsewhere classified: Secondary | ICD-10-CM

## 2023-08-11 DIAGNOSIS — M6281 Muscle weakness (generalized): Secondary | ICD-10-CM

## 2023-08-11 DIAGNOSIS — R600 Localized edema: Secondary | ICD-10-CM

## 2023-08-11 DIAGNOSIS — M25631 Stiffness of right wrist, not elsewhere classified: Secondary | ICD-10-CM

## 2023-08-11 DIAGNOSIS — L905 Scar conditions and fibrosis of skin: Secondary | ICD-10-CM | POA: Diagnosis not present

## 2023-08-11 NOTE — Therapy (Signed)
OUTPATIENT OCCUPATIONAL THERAPY ORTHO  TREATMENT  Patient Name: Wanda Smith MRN: [id number] DOB:Apr 30, 1961, 62 y.o., female   PCP: Merlinda Frederick NP REFERRING PROVIDER: Marney Doctor PA  END OF SESSION:  OT End of Session - 08/11/23 0722     Visit Number 27    Number of Visits 38    Date for OT Re-Evaluation 09/26/23    OT Start Time 0816    OT Stop Time 0900    OT Time Calculation (min) 44 min    Activity Tolerance Patient tolerated treatment well    Behavior During Therapy [facility] for tasks assessed/performed             Past Medical History:  Diagnosis Date   Anemia    Anxiety    Arthritis    knee (R)   Asthma    Carpal tunnel syndrome    Cellulitis of right hand 03/30/2023   Chronic kidney disease, stage 3a (HCC)    Dr Cherylann Ratel   COPD (chronic obstructive pulmonary disease) (HCC)    Depression    Diabetes mellitus without complication (HCC)    Dyspnea    GERD (gastroesophageal reflux disease)    occ   Hyperlipidemia    Hypertension    Hypothyroidism    Increased serum lipids    MRSA infection 03/30/2023   right hand   Osteoporosis    Severe sepsis (HCC) 03/30/2023   Type 2 diabetes mellitus with renal manifestations (HCC)    Wears contact lenses    Past Surgical History:  Procedure Laterality Date   ABDOMINAL HYSTERECTOMY  1998   CARPAL TUNNEL RELEASE Right 07/26/2018   Procedure: CARPAL TUNNEL RELEASE ENDOSCOPIC;  Surgeon: Christena Flake, MD;  Location: [facility];  Service: Orthopedics;  Laterality: Right;  diabetic - oral meds   CHOLECYSTECTOMY  2013   CLOSED REDUCTION METACARPAL WITH PERCUTANEOUS PINNING Right 07/05/2023   Procedure: MANIPULATION UNDER ANESTHESIA OF RIGHT INDEX AND LONG FINGERS WITH POSSIBLE CAPSULAR OF RIGHT INDEX AND LONG MCP JOINTS;  Surgeon: Christena Flake, MD;  Location: [facility];  Service: Orthopedics;  Laterality: Right;   COLONOSCOPY WITH ESOPHAGOGASTRODUODENOSCOPY (EGD)  03/03/2012   COLONOSCOPY WITH  ESOPHAGOGASTRODUODENOSCOPY (EGD)  02/23/2022   COLONOSCOPY WITH ESOPHAGOGASTRODUODENOSCOPY (EGD)  01/16/1991   COLONOSCOPY WITH PROPOFOL N/A 01/13/2018   Procedure: COLONOSCOPY WITH PROPOFOL;  Surgeon: Scot Jun, MD;  Location: [facility];  Service: Endoscopy;  Laterality: N/A;   HERNIA REPAIR  1966   I & D EXTREMITY Right 04/01/2023   Procedure: IRRIGATION AND DEBRIDEMENT EXTREMITY PREVENA PLACEMENT;  Surgeon: Christena Flake, MD;  Location: [facility];  Service: Orthopedics;  Laterality: Right;   I-131 THYROID ABLATION     INCISION AND DRAINAGE ABSCESS Right 03/30/2023   Procedure: INCISION AND DRAINAGE ABSCESS RIGHT HAND;  Surgeon: Christena Flake, MD;  Location: [facility];  Service: Orthopedics;  Laterality: Right;   INCISION AND DRAINAGE OF WOUND Right 04/04/2023   Procedure: IRRIGATION AND DEBRIDEMENT RIGHT HAND DORSAL AND PALMAR INCISIONS WITH DELAYED PRIMARY CLOSURE;  Surgeon: Christena Flake, MD;  Location: [facility];  Service: Orthopedics;  Laterality: Right;   ROTATOR CUFF REPAIR Left 2011   SHOULDER ARTHROSCOPY WITH SUBACROMIAL DECOMPRESSION, ROTATOR CUFF REPAIR AND BICEP TENDON REPAIR Right 10/21/2021   Procedure: SHOULDER ARTHROSCOPY WITH DEBRIDEMENT, DECOMPRESSION, BICEPS TENODESIS.;  Surgeon: Christena Flake, MD;  Location: [facility];  Service: Orthopedics;  Laterality: Right;   TUBAL LIGATION  1993   Patient Active Problem List   Diagnosis Date  Noted   MRSA infection 04/04/2023   Abscess of right hand 03/31/2023   Cellulitis of right hand 03/30/2023   Depression with anxiety 03/30/2023   HLD (hyperlipidemia) 03/30/2023   Type II diabetes mellitus with renal manifestations (HCC) 03/30/2023   Chronic kidney disease, stage 3a (HCC) 03/30/2023   Abnormal LFTs 03/30/2023   Severe sepsis (HCC) 03/30/2023   Hypothyroidism    Hypertension    COPD (chronic obstructive pulmonary disease) (HCC)     ONSET DATE: 03/30/23  REFERRING DIAG:Abscess of R hand -3 irrigation  surgeries/Capsulectomy 2nd and 3rd MC - release of collateral ligaments  THERAPY DIAG:  Scar condition and fibrosis of skin  Localized edema  Muscle weakness (generalized)  Stiffness of right hand, not elsewhere classified  Stiffness of right wrist, not elsewhere classified  Rationale for Evaluation and Treatment: Rehabilitation  SUBJECTIVE:   SUBJECTIVE STATEMENT: Pt reports she is doing well, just has some increased aching and stiffness in the hand with the colder weather.   pt accompanied by: self  PERTINENT HISTORY:  Ortho note 04/20/23 Wanda Smith is a 62 y.o. female who presents today for a skin check status post multiple irrigation and debridements involving right dorsal and volar hand wounds and possible suture removal. The patient was admitted to the hospital after suffering a dog scratch which eventually became more swollen and painful. The patient underwent a CT scan of the right hand which demonstrated a abscess formation along the volar aspect of the hand initially. She underwent 1 irrigation debridement however the patient continued to have swelling and pain prompting a repeat CT scan which demonstrated abscess formation along the dorsal aspect of the hand. The patient underwent a second irrigation debridement and a final third procedure performed by Dr. Joice Lofts on 04/04/2023. The patient was evaluated last week where skin inspection demonstrated excellent granulation without any significant erythema or purulent drainage. The patient then had a follow-up with infectious disease where repeat CRP came back at a normal level. The patient does still have a oral prescription for linezolid which she is scheduled to take until end this week. She denies any trauma or injury affecting the right hand since her last evaluation. She does report an occasional burning discomfort in the index finger. She is still taking occasional oxycodone. She reports a 0 out of 10 pain score at today's  visit. Stitches remove- and to finish antibiotics and refer to OT. ON 07/05/2023 - Pt had by Dr Joice Lofts   Attempted closed manipulation under anesthesia,  then had open capsulectomies with release of radial and ulnar collateral ligaments of both index and long finger MCP joints.- refer to OT to start ROM , splinting and tx 3 days later    PRECAUTIONS: None     WEIGHT BEARING RESTRICTIONS: No  PAIN:  Are you having pain? 3-4/10 after the stretches   FALLS: Has patient fallen in last 6 months? No  LIVING ENVIRONMENT: Lives with: lives with their family and lives with their spouse   PLOF: Work on [employer], gym 3 x wk , do own cooking and housework  PATIENT GOALS: Get my motion and strength back in the right hand so that I can carry and grip and hold objects  NEXT MD VISIT: middle Dec OBJECTIVE:   HAND DOMINANCE: Right   UPPER EXTREMITY ROM:     Active ROM Right eval Left eval R  04/29/23 05/03/23 05/16/23 R 9/16/24R 06/02/23 R R 07/08/23 R 07/18/23 R 08/02/23 R 08/09/2023  Shoulder flexion  Shoulder abduction             Shoulder adduction             Shoulder extension             Shoulder internal rotation             Shoulder external rotation             Elbow flexion             Elbow extension             Wrist flexion 35 110 50 50 63 72 80 60 76    Wrist extension 48 85 60 55 65 70 70 50 67 78  78  Wrist ulnar deviation 24      35 30     Wrist radial deviation 20      25 20      Wrist pronation             Wrist supination             (Blank rows = not tested)  Active ROM Right eval Left eval R   04/29/23 R  05/12/23 R  05/16/23 R 05/23/23 R 06/02/23 R 06/09/23 R 07/08/23 R 07/13/23  In session R 07/22/23 R 08/02/23 R 08/09/2023  Thumb MCP (0-60) 70              Thumb IP (0-80) 35              Thumb Radial abd/add (0-55) 45    50  50 50        Thumb Palmar abd/add (0-45) 45    50  65 55        Thumb Opposition to Small Finger Opposition to 3rd, 4th  and 5th     Opposition to all digits   Opposition to base of 5th         Index MCP (0-90) 30   50 60 60( 65 66 70 70 75 75 75 75  Index PIP (0-100) 45( ext -30   55 ( ext -20 60 ( ext -10) 60 ( blocked 70) 60 66 70 50 70 70 70 70  Index DIP (0-70)       50          Long MCP (0-90)  45   50 60 65 70 70 75 ( 80 in session)  Ext -50 flexion65 80 75 80   Long PIP (0-100)  90( ext -20   95 ( ext -15 95 ( ext -5)  100 100 100 90 80 95 100   Long DIP (0-70)                 Ring MCP (0-90)  45   60 70 75 75 80 80( 90 in session Ext -45 flexion 70 90 90 85   Ring PIP (0-100)  100 (ext -10   100( ext 0  100 - full ext  100 100 100 100 95 95 100   Ring DIP (0-70)                 Little MCP (0-90)  75   75 80  80 85 85( 90 in session) 65 90 85 85   Little PIP (0-100)  90 (ext 0   100 ( ext 0 100- full ext   100 100 100 10 95 100 100   Little DIP (0-70)                 (  Blank rows = not tested) I  05/23/23 GRIP R 14 lbs L 55 lbs, lat pinch R 4 lbs L 14 lbs and 3 point pinch R 5 lbs and L 10 lbs   05/30/23: Lat grip R 7 lbs , 14 L - 3 point R 6lbs , L 10 lbs   06/07/23 GRIP R 15 lbs L 55 lbs, lat pinch R 7 lbs L 14 lbs and 3 point pinch R 8 lbs and L 10 lbs 06/21/23 GRIP R 12 lbs L 48 lbs, lat pinch R 5 lbs L 14 lbs and 3 point pinch R 6 lbs and L 10 lbs 06/25/23 GRIP R 15 lbs L 48 lbs, lat pinch R 6lbs L 14 lbs and 3 point pinch R 8 lbs and L 10 lbs 08/02/23 GRIP R 19 lbs   COORDINATION: Decreasing significantly because of stiffness and scar tissue in the second digit  EDEMA: R hand   COGNITION: Overall cognitive status: Within functional limits for tasks assessed    TODAY'S TREATMENT:                                                                                                                              DATE:  08/11/23  Paraffin to right hand and wrist for 8 mins to decrease pain, stiffness and increase ROM.  Pt stop wearing daytime MC  block splint last Friday -cont night ime MC block  splint to increase MC flexion in splint to 80-90 flexion -at nighttime.   Manual Therapy:   Pt seen for scar massage, both scars to decrease adhesions and improve tissue mobilization for greater ROM issued new cica scar pad to use night time   Decreased edema noted, continues to utilize and wear isotoner glove for edema management and control  2 layers Moleskin inside dorsal hand-based custom MC block splint for cushioning over dorsal hand   2  times a day contrast followed by abduction and adduction of digits-- doing well Metacarpal flexion with IP extension - to about 90  degrees in session - PROM 90 PROM to PIP/DIP flexion- - hold 15 sec x 10 With PROM - keeping MC at 90 - initially tight but increased in session in composite flexion Composite fist it palm for 3rd thru 5th -2 palm and a placed on hold.  Focusing on maintaining MC 90 Focus on place and hold into palm for 3rd thru 5th   Traction to wrist gentle with PROM wrist extension and composite flexion  8 x 30 sec  Cont at home with table slides 20 reps for patient she was unable to do wall push-up. Prior to AROM and AAROM   Squeezing into putty light blue easy- place and hold composite for 3rd thru 5th into putty  10-12 reps keeping pain under 2/10  teal med putty for lat pinch  and 3 point pinch - 2x day  12-15 reps- pain free  precision pinch into teal putty  retrieving 1 cm small objects- - 2-3 x day - 8-10 objects   cont 3 pound weight for supination pronation support on thigh 12 reps. Followed by 3 pound weight for scapular squeezes, shoulder extension, overhead punches and elbow curls as well as triceps 2 sets of 12 1 time a day.   PATIENT EDUCATION: Education details: Findings of evaluation and home program Person educated: Patient Education method: Explanation, Demonstration, Tactile cues, Verbal cues, and Handouts Education comprehension: verbalized understanding, returned demonstration, verbal cues required,  tactile cues required, and needs further education   GOALS: Goals reviewed with patient? Yes  SHORT TERM GOALS: Target date: 4 wks  Patient to be independent in home program to decrease scar tissue and edema - increased extension and flexion of digits as well as wrist Baseline: Severe stiffness in digits, i 3 days ago surgery stitches in place  this date- Goal status: MET   LONG TERM GOALS: Target date: 8 wks Flexion of right digits improved for patient to touch palm to initiate use in ADLs with no increase symptoms Baseline: Surgery done 3 days ago -capsulectomy -MC flexion 65 to 70 degrees coming in.  PIP 52 degrees.   NOW 3rd thru 5th touching palm Goal status: progressing    2.  Patient increase extension of right digits 2nd thru 4th to be able to don gloves, wash face and reach aroundcylinder objects without issues Baseline: 3 days ago patient had capsulectomy.  Extension of metacarpals -50-second and -40 degrees at third NOW improved greatly -still not able to lay hand flat or do wall pushup Goal status: MET   3.  Scar tissue improved for patient to be able to make composite fist as well as increase extension to don gloves and put hand in pocket Baseline: 3 days ago had surgery.  Stitches in place.  Decreased flexion and extension with increased edema  goal status:progressing    4.  Right grip and prehension strength increased to more than 75% compared to the left for patient to hold weight to work out and cook Baseline: 3 days postop from capsulectomy with ligament release on second and third metacarpal no strength  tested Goal status: progressing   5.  R wrist AROM increase to WNL to push and pull door open without increase symptoms  Baseline:  wrist flxion 60, ext 50 Goal status: progressing   ASSESSMENT:  CLINICAL IMPRESSION: Patient presents at occupational therapy evaluation 3 days postop capsulectomy of right second and third metacarpal with release of collateral  ligaments.  Patient arrived with soft cast in place.  Patient had earlier this year multiple irrigation and debridements involving right dorsal and volar hand wounds -with abscess and infection.  Patient was seen by therapy in September.  But limited by scar tissue and joint capsule tightness.   NOW pt with  great progress in [facility] and PIP flexion 3rd thru 5th - MC80-85 and PIP 100 -  2nd MC flexion 75 to 85 and PIP 70-80 degrees - tenderness mostly at 2nd [facility] -pt was able to maintain progress without wearing daytime  hand-based MC flexion splint-MC flexion to 80-90  but still to wear night time  Show decrease edema using isotoner glove and keep pain under 2/10 with A/PROM.  Focus on PIP/DIP flexion  and then into composite adding MC - focus on place and hold for 3rd thru 5th to palm -cont light blue putty for composite gripping -  Grip increase to 19 lbs , lat pinch 7lbs and  3 point was 8lbs  - good progress - Patient limited in functional use of right dominant hand.  Continued progress in all areas, edema decreased with use of compression glove, ROM improving but still has tightness in digits especially with colder weather this week, improving with composite flexion 3rd thru 5th digits and index improving but still lagging in passive motion.  Reminded pt to monitor her repetitions of resistive putty to avoid increased pain in the hand. Patient can benefit from skilled OT services to decrease edema, scar tissue and increase range of motion in flexion and extension of digits as well as wrist, increase strength to return to prior level of function. PERFORMANCE DEFICITS: in functional skills including ADLs, IADLs, coordination, dexterity, edema, ROM, strength, pain, flexibility, and UE functional use,   and psychosocial skills including habits and routines and behaviors.   IMPAIRMENTS: are limiting patient from ADLs, IADLs, rest and sleep, work, play, leisure, and social participation.   COMORBIDITIES: has no other  co-morbidities that affects occupational performance. Patient will benefit from skilled OT to address above impairments and improve overall function.  MODIFICATION OR ASSISTANCE TO COMPLETE EVALUATION: No modification of tasks or assist necessary to complete an evaluation.  OT OCCUPATIONAL PROFILE AND HISTORY: Problem focused assessment: Including review of records relating to presenting problem.  CLINICAL DECISION MAKING: LOW - limited treatment options, no task modification necessary  REHAB POTENTIAL: Good  EVALUATION COMPLEXITY: Low  PLAN:  OT FREQUENCY: 3 x wk initially - decrease depending on progress  OT DURATION: 8 weeks  PLANNED INTERVENTIONS: ADL's therapeutic exercise, therapeutic activity, manual therapy, scar mobilization, passive range of motion, splinting, paraffin, fluidotherapy, contrast bath, patient/family education, and DME and/or AE instructions  CONSULTED AND AGREED WITH PLAN OF CARE: Patient   Daoud Lobue, OTR/L,CLT 08/11/2023, 7:23 AM

## 2023-08-11 NOTE — Therapy (Signed)
OUTPATIENT OCCUPATIONAL THERAPY ORTHO  TREATMENT  Patient Name: Wanda Smith MRN: [id number] DOB:Apr 28, 1961, 62 y.o., female   PCP: Merlinda Frederick NP REFERRING PROVIDER: Marney Doctor PA  END OF SESSION:  OT End of Session - 08/11/23 1613     Visit Number 27    Number of Visits 38    Date for OT Re-Evaluation 09/26/23    OT Start Time 0816    OT Stop Time 0900    OT Time Calculation (min) 44 min    Activity Tolerance Patient tolerated treatment well    Behavior During Therapy [facility] for tasks assessed/performed             Past Medical History:  Diagnosis Date   Anemia    Anxiety    Arthritis    knee (R)   Asthma    Carpal tunnel syndrome    Cellulitis of right hand 03/30/2023   Chronic kidney disease, stage 3a (HCC)    Dr Cherylann Ratel   COPD (chronic obstructive pulmonary disease) (HCC)    Depression    Diabetes mellitus without complication (HCC)    Dyspnea    GERD (gastroesophageal reflux disease)    occ   Hyperlipidemia    Hypertension    Hypothyroidism    Increased serum lipids    MRSA infection 03/30/2023   right hand   Osteoporosis    Severe sepsis (HCC) 03/30/2023   Type 2 diabetes mellitus with renal manifestations (HCC)    Wears contact lenses    Past Surgical History:  Procedure Laterality Date   ABDOMINAL HYSTERECTOMY  1998   CARPAL TUNNEL RELEASE Right 07/26/2018   Procedure: CARPAL TUNNEL RELEASE ENDOSCOPIC;  Surgeon: Christena Flake, MD;  Location: [facility];  Service: Orthopedics;  Laterality: Right;  diabetic - oral meds   CHOLECYSTECTOMY  2013   CLOSED REDUCTION METACARPAL WITH PERCUTANEOUS PINNING Right 07/05/2023   Procedure: MANIPULATION UNDER ANESTHESIA OF RIGHT INDEX AND LONG FINGERS WITH POSSIBLE CAPSULAR OF RIGHT INDEX AND LONG MCP JOINTS;  Surgeon: Christena Flake, MD;  Location: [facility];  Service: Orthopedics;  Laterality: Right;   COLONOSCOPY WITH ESOPHAGOGASTRODUODENOSCOPY (EGD)  03/03/2012   COLONOSCOPY WITH  ESOPHAGOGASTRODUODENOSCOPY (EGD)  02/23/2022   COLONOSCOPY WITH ESOPHAGOGASTRODUODENOSCOPY (EGD)  01/16/1991   COLONOSCOPY WITH PROPOFOL N/A 01/13/2018   Procedure: COLONOSCOPY WITH PROPOFOL;  Surgeon: Scot Jun, MD;  Location: [facility];  Service: Endoscopy;  Laterality: N/A;   HERNIA REPAIR  1966   I & D EXTREMITY Right 04/01/2023   Procedure: IRRIGATION AND DEBRIDEMENT EXTREMITY PREVENA PLACEMENT;  Surgeon: Christena Flake, MD;  Location: [facility];  Service: Orthopedics;  Laterality: Right;   I-131 THYROID ABLATION     INCISION AND DRAINAGE ABSCESS Right 03/30/2023   Procedure: INCISION AND DRAINAGE ABSCESS RIGHT HAND;  Surgeon: Christena Flake, MD;  Location: [facility];  Service: Orthopedics;  Laterality: Right;   INCISION AND DRAINAGE OF WOUND Right 04/04/2023   Procedure: IRRIGATION AND DEBRIDEMENT RIGHT HAND DORSAL AND PALMAR INCISIONS WITH DELAYED PRIMARY CLOSURE;  Surgeon: Christena Flake, MD;  Location: [facility];  Service: Orthopedics;  Laterality: Right;   ROTATOR CUFF REPAIR Left 2011   SHOULDER ARTHROSCOPY WITH SUBACROMIAL DECOMPRESSION, ROTATOR CUFF REPAIR AND BICEP TENDON REPAIR Right 10/21/2021   Procedure: SHOULDER ARTHROSCOPY WITH DEBRIDEMENT, DECOMPRESSION, BICEPS TENODESIS.;  Surgeon: Christena Flake, MD;  Location: [facility];  Service: Orthopedics;  Laterality: Right;   TUBAL LIGATION  1993   Patient Active Problem List   Diagnosis Date  Noted   MRSA infection 04/04/2023   Abscess of right hand 03/31/2023   Cellulitis of right hand 03/30/2023   Depression with anxiety 03/30/2023   HLD (hyperlipidemia) 03/30/2023   Type II diabetes mellitus with renal manifestations (HCC) 03/30/2023   Chronic kidney disease, stage 3a (HCC) 03/30/2023   Abnormal LFTs 03/30/2023   Severe sepsis (HCC) 03/30/2023   Hypothyroidism    Hypertension    COPD (chronic obstructive pulmonary disease) (HCC)     ONSET DATE: 03/30/23  REFERRING DIAG:Abscess of R hand -3 irrigation  surgeries/Capsulectomy 2nd and 3rd MC - release of collateral ligaments  THERAPY DIAG:  Scar condition and fibrosis of skin  Localized edema  Muscle weakness (generalized)  Stiffness of right hand, not elsewhere classified  Stiffness of right wrist, not elsewhere classified  Rationale for Evaluation and Treatment: Rehabilitation  SUBJECTIVE:   SUBJECTIVE STATEMENT: Stiff just with the cold weather -but my motion is so much better than it was.  My putty is getting easier  pt accompanied by: self  PERTINENT HISTORY:  Ortho note 04/20/23 Wanda Smith is a 61 y.o. female who presents today for a skin check status post multiple irrigation and debridements involving right dorsal and volar hand wounds and possible suture removal. The patient was admitted to the hospital after suffering a dog scratch which eventually became more swollen and painful. The patient underwent a CT scan of the right hand which demonstrated a abscess formation along the volar aspect of the hand initially. She underwent 1 irrigation debridement however the patient continued to have swelling and pain prompting a repeat CT scan which demonstrated abscess formation along the dorsal aspect of the hand. The patient underwent a second irrigation debridement and a final third procedure performed by Dr. Joice Lofts on 04/04/2023. The patient was evaluated last week where skin inspection demonstrated excellent granulation without any significant erythema or purulent drainage. The patient then had a follow-up with infectious disease where repeat CRP came back at a normal level. The patient does still have a oral prescription for linezolid which she is scheduled to take until end this week. She denies any trauma or injury affecting the right hand since her last evaluation. She does report an occasional burning discomfort in the index finger. She is still taking occasional oxycodone. She reports a 0 out of 10 pain score at today's visit.  Stitches remove- and to finish antibiotics and refer to OT. ON 07/05/2023 - Pt had by Dr Joice Lofts   Attempted closed manipulation under anesthesia,  then had open capsulectomies with release of radial and ulnar collateral ligaments of both index and long finger MCP joints.- refer to OT to start ROM , splinting and tx 3 days later    PRECAUTIONS: None     WEIGHT BEARING RESTRICTIONS: No  PAIN:  Are you having pain?  More stiffness than pain  FALLS: Has patient fallen in last 6 months? No  LIVING ENVIRONMENT: Lives with: lives with their family and lives with their spouse   PLOF: Work on [employer], gym 3 x wk , do own cooking and housework  PATIENT GOALS: Get my motion and strength back in the right hand so that I can carry and grip and hold objects  NEXT MD VISIT: middle Dec OBJECTIVE:   HAND DOMINANCE: Right   UPPER EXTREMITY ROM:     Active ROM Right eval Left eval R  04/29/23 05/03/23 05/16/23 R 9/16/24R 06/02/23 R R 07/08/23 R 07/18/23 R 08/02/23 R 08/09/2023  Shoulder  flexion             Shoulder abduction             Shoulder adduction             Shoulder extension             Shoulder internal rotation             Shoulder external rotation             Elbow flexion             Elbow extension             Wrist flexion 35 110 50 50 63 72 80 60 76    Wrist extension 48 85 60 55 65 70 70 50 67 78  78  Wrist ulnar deviation 24      35 30     Wrist radial deviation 20      25 20      Wrist pronation             Wrist supination             (Blank rows = not tested)  Active ROM Right eval Left eval R   04/29/23 R  05/12/23 R  05/16/23 R 05/23/23 R 06/02/23 R 06/09/23 R 07/08/23 R 07/13/23  In session R 07/22/23 R 08/02/23 R 08/09/2023  Thumb MCP (0-60) 70              Thumb IP (0-80) 35              Thumb Radial abd/add (0-55) 45    50  50 50        Thumb Palmar abd/add (0-45) 45    50  65 55        Thumb Opposition to Small Finger Opposition to 3rd, 4th and 5th      Opposition to all digits   Opposition to base of 5th         Index MCP (0-90) 30   50 60 60( 65 66 70 70 75 75 75 75  Index PIP (0-100) 45( ext -30   55 ( ext -20 60 ( ext -10) 60 ( blocked 70) 60 66 70 50 70 70 70 70  Index DIP (0-70)       50          Long MCP (0-90)  45   50 60 65 70 70 75 ( 80 in session)  Ext -50 flexion65 80 75 80   Long PIP (0-100)  90( ext -20   95 ( ext -15 95 ( ext -5)  100 100 100 90 80 95 100   Long DIP (0-70)                 Ring MCP (0-90)  45   60 70 75 75 80 80( 90 in session Ext -45 flexion 70 90 90 85   Ring PIP (0-100)  100 (ext -10   100( ext 0  100 - full ext  100 100 100 100 95 95 100   Ring DIP (0-70)                 Little MCP (0-90)  75   75 80  80 85 85( 90 in session) 65 90 85 85   Little PIP (0-100)  90 (ext 0   100 ( ext 0 100- full ext   100 100 100  10 95 100 100   Little DIP (0-70)                 (Blank rows = not tested) I  05/23/23 GRIP R 14 lbs L 55 lbs, lat pinch R 4 lbs L 14 lbs and 3 point pinch R 5 lbs and L 10 lbs   05/30/23: Lat grip R 7 lbs , 14 L - 3 point R 6lbs , L 10 lbs   06/07/23 GRIP R 15 lbs L 55 lbs, lat pinch R 7 lbs L 14 lbs and 3 point pinch R 8 lbs and L 10 lbs 06/21/23 GRIP R 12 lbs L 48 lbs, lat pinch R 5 lbs L 14 lbs and 3 point pinch R 6 lbs and L 10 lbs 06/25/23 GRIP R 15 lbs L 48 lbs, lat pinch R 6lbs L 14 lbs and 3 point pinch R 8 lbs and L 10 lbs 08/02/23 GRIP R 19 lbs   COORDINATION: Decreasing significantly because of stiffness and scar tissue in the second digit  EDEMA: R hand   COGNITION: Overall cognitive status: Within functional limits for tasks assessed    TODAY'S TREATMENT:                                                                                                                              DATE:  08/11/23  Patient arrive with MC flexion 3rd through 5th 80 to 85 degrees and PIP is 100 Second MC 70 and PIP 65. Incision 3rd through 5th increased to 85-90 degrees, and second to 80  degrees MCP and PIP 70 Done fluidotherapy with patient with active range of motion for digits in all range of motion  Fitted patient with Knuckle Bender splint with 3 bands on the radial side and ulnar side 5 bands During provide MC flexion stretch. Patient to wear 5 minutes after which she works on composite flexion placed on hold to bar and palm After removing it at the max can wear 15 minutes Work on composite placing hold flexion of digits   Squeezing into putty upgrade to green medium putty for composite gripping- place and hold composite for 3rd thru 5th into putty  As well as pulling with 3rd through 5th digits 10-12 reps keeping pain under 2/10  Also upgrade to green putty for lat pinch  and 3 point pinch - 2x day  12-15 reps- pain free  precision pinch into green putty retrieving 1 cm small objects- - 2-3 x day - 8-10 objects   cont 3 pound weight for supination pronation support on thigh 12 reps. Followed by 4 pound weight for scapular squeezes, shoulder extension, overhead punches and elbow curls as well as triceps 2 sets of 12 1 time a day.   PATIENT EDUCATION: Education details: Findings of evaluation and home program Person educated: Patient Education method: Explanation, Demonstration, Tactile cues, Verbal cues, and Handouts Education comprehension: verbalized understanding, returned demonstration, verbal  cues required, tactile cues required, and needs further education   GOALS: Goals reviewed with patient? Yes  SHORT TERM GOALS: Target date: 4 wks  Patient to be independent in home program to decrease scar tissue and edema - increased extension and flexion of digits as well as wrist Baseline: Severe stiffness in digits, i 3 days ago surgery stitches in place  this date- Goal status: MET   LONG TERM GOALS: Target date: 8 wks Flexion of right digits improved for patient to touch palm to initiate use in ADLs with no increase symptoms Baseline: Surgery done 3 days  ago -capsulectomy -MC flexion 65 to 70 degrees coming in.  PIP 52 degrees.   NOW 3rd thru 5th touching palm Goal status: progressing    2.  Patient increase extension of right digits 2nd thru 4th to be able to don gloves, wash face and reach aroundcylinder objects without issues Baseline: 3 days ago patient had capsulectomy.  Extension of metacarpals -50-second and -40 degrees at third NOW improved greatly -still not able to lay hand flat or do wall pushup Goal status: MET   3.  Scar tissue improved for patient to be able to make composite fist as well as increase extension to don gloves and put hand in pocket Baseline: 3 days ago had surgery.  Stitches in place.  Decreased flexion and extension with increased edema  goal status:progressing    4.  Right grip and prehension strength increased to more than 75% compared to the left for patient to hold weight to work out and cook Baseline: 3 days postop from capsulectomy with ligament release on second and third metacarpal no strength  tested Goal status: progressing   5.  R wrist AROM increase to WNL to push and pull door open without increase symptoms  Baseline:  wrist flxion 60, ext 50 Goal status: progressing   ASSESSMENT:  CLINICAL IMPRESSION: Patient presents at occupational therapy evaluation 3 days postop capsulectomy of right second and third metacarpal with release of collateral ligaments.  Patient arrived with soft cast in place.  Patient had earlier this year multiple irrigation and debridements involving right dorsal and volar hand wounds -with abscess and infection.  Patient was seen by therapy in September.  But limited by scar tissue and joint capsule tightness.   NOW patient progressing very well and 3rd-5th digits for composite fist.  Only limiting in last 5 to 10 degrees of MC flexion.  Second digit improving during session able to maintain progress better.  Able to upgrade patient's putty resistance.  Grip increased to 20  pounds. Patient limited in functional use of right dominant hand.  Continued progress in all areas, edema decreased with use of compression glove, ROM improving but still has tightness in digits especially with colder weather this week.  Reminded pt to monitor her repetitions of resistive putty to avoid increased pain in the hand.  Patient can discharge tonight wearing of MC block splint.  Patient can benefit from skilled OT services to decrease edema, scar tissue and increase range of motion in flexion and extension of digits as well as wrist, increase strength to return to prior level of function. PERFORMANCE DEFICITS: in functional skills including ADLs, IADLs, coordination, dexterity, edema, ROM, strength, pain, flexibility, and UE functional use,   and psychosocial skills including habits and routines and behaviors.   IMPAIRMENTS: are limiting patient from ADLs, IADLs, rest and sleep, work, play, leisure, and social participation.   COMORBIDITIES: has no other co-morbidities that  affects occupational performance. Patient will benefit from skilled OT to address above impairments and improve overall function.  MODIFICATION OR ASSISTANCE TO COMPLETE EVALUATION: No modification of tasks or assist necessary to complete an evaluation.  OT OCCUPATIONAL PROFILE AND HISTORY: Problem focused assessment: Including review of records relating to presenting problem.  CLINICAL DECISION MAKING: LOW - limited treatment options, no task modification necessary  REHAB POTENTIAL: Good  EVALUATION COMPLEXITY: Low  PLAN:  OT FREQUENCY: 3 x wk initially - decrease depending on progress  OT DURATION: 8 weeks  PLANNED INTERVENTIONS: ADL's therapeutic exercise, therapeutic activity, manual therapy, scar mobilization, passive range of motion, splinting, paraffin, fluidotherapy, contrast bath, patient/family education, and DME and/or AE instructions  CONSULTED AND AGREED WITH PLAN OF CARE: Patient   Oletta Cohn, OTR/L,CLT 08/11/2023, 4:15 PM    OUTPATIENT OCCUPATIONAL THERAPY ORTHO  TREATMENT  Patient Name: Wanda Smith MRN: [id number] DOB:[birth year], 62 y.o., female   PCP: Merlinda Frederick NP REFERRING PROVIDER: Marney Doctor PA  END OF SESSION:  OT End of Session - 08/11/23 1613     Visit Number 27    Number of Visits 38    Date for OT Re-Evaluation 09/26/23    OT Start Time 0816    OT Stop Time 0900    OT Time Calculation (min) 44 min    Activity Tolerance Patient tolerated treatment well    Behavior During Therapy [facility] for tasks assessed/performed             Past Medical History:  Diagnosis Date   Anemia    Anxiety    Arthritis    knee (R)   Asthma    Carpal tunnel syndrome    Cellulitis of right hand 03/30/2023   Chronic kidney disease, stage 3a (HCC)    Dr Cherylann Ratel   COPD (chronic obstructive pulmonary disease) (HCC)    Depression    Diabetes mellitus without complication (HCC)    Dyspnea    GERD (gastroesophageal reflux disease)    occ   Hyperlipidemia    Hypertension    Hypothyroidism    Increased serum lipids    MRSA infection 03/30/2023   right hand   Osteoporosis    Severe sepsis (HCC) 03/30/2023   Type 2 diabetes mellitus with renal manifestations (HCC)    Wears contact lenses    Past Surgical History:  Procedure Laterality Date   ABDOMINAL HYSTERECTOMY  1998   CARPAL TUNNEL RELEASE Right 07/26/2018   Procedure: CARPAL TUNNEL RELEASE ENDOSCOPIC;  Surgeon: Christena Flake, MD;  Location: [facility];  Service: Orthopedics;  Laterality: Right;  diabetic - oral meds   CHOLECYSTECTOMY  2013   CLOSED REDUCTION METACARPAL WITH PERCUTANEOUS PINNING Right 07/05/2023   Procedure: MANIPULATION UNDER ANESTHESIA OF RIGHT INDEX AND LONG FINGERS WITH POSSIBLE CAPSULAR OF RIGHT INDEX AND LONG MCP JOINTS;  Surgeon: Christena Flake, MD;  Location: [facility];  Service: Orthopedics;  Laterality: Right;   COLONOSCOPY WITH ESOPHAGOGASTRODUODENOSCOPY (EGD)   03/03/2012   COLONOSCOPY WITH ESOPHAGOGASTRODUODENOSCOPY (EGD)  02/23/2022   COLONOSCOPY WITH ESOPHAGOGASTRODUODENOSCOPY (EGD)  01/16/1991   COLONOSCOPY WITH PROPOFOL N/A 01/13/2018   Procedure: COLONOSCOPY WITH PROPOFOL;  Surgeon: Scot Jun, MD;  Location: [facility] END[facility]COPY;  Service: Endoscopy;  Laterality: N/A;   HERNIA REPAIR  1966   I & D EXTREMITY Right 04/01/2023   Procedure: IRRIGATION AND DEBRIDEMENT EXTREMITY PREVENA PLACEMENT;  Surgeon: Christena Flake, MD;  Location: [facility];  Service: Orthopedics;  Laterality: Right;   I-131 THYROID ABLATION  INCISION AND DRAINAGE ABSCESS Right 03/30/2023   Procedure: INCISION AND DRAINAGE ABSCESS RIGHT HAND;  Surgeon: Christena Flake, MD;  Location: [facility];  Service: Orthopedics;  Laterality: Right;   INCISION AND DRAINAGE OF WOUND Right 04/04/2023   Procedure: IRRIGATION AND DEBRIDEMENT RIGHT HAND DORSAL AND PALMAR INCISIONS WITH DELAYED PRIMARY CLOSURE;  Surgeon: Christena Flake, MD;  Location: [facility];  Service: Orthopedics;  Laterality: Right;   ROTATOR CUFF REPAIR Left 2011   SHOULDER ARTHROSCOPY WITH SUBACROMIAL DECOMPRESSION, ROTATOR CUFF REPAIR AND BICEP TENDON REPAIR Right 10/21/2021   Procedure: SHOULDER ARTHROSCOPY WITH DEBRIDEMENT, DECOMPRESSION, BICEPS TENODESIS.;  Surgeon: Christena Flake, MD;  Location: [facility];  Service: Orthopedics;  Laterality: Right;   TUBAL LIGATION  1993   Patient Active Problem List   Diagnosis Date Noted   MRSA infection 04/04/2023   Abscess of right hand 03/31/2023   Cellulitis of right hand 03/30/2023   Depression with anxiety 03/30/2023   HLD (hyperlipidemia) 03/30/2023   Type II diabetes mellitus with renal manifestations (HCC) 03/30/2023   Chronic kidney disease, stage 3a (HCC) 03/30/2023   Abnormal LFTs 03/30/2023   Severe sepsis (HCC) 03/30/2023   Hypothyroidism    Hypertension    COPD (chronic obstructive pulmonary disease) (HCC)     ONSET DATE: 03/30/23  REFERRING DIAG:Abscess of R  hand -3 irrigation surgeries/Capsulectomy 2nd and 3rd MC - release of collateral ligaments  THERAPY DIAG:  Scar condition and fibrosis of skin  Localized edema  Muscle weakness (generalized)  Stiffness of right hand, not elsewhere classified  Stiffness of right wrist, not elsewhere classified  Rationale for Evaluation and Treatment: Rehabilitation  SUBJECTIVE:   SUBJECTIVE STATEMENT: Pt reports she is doing well, just has some increased aching and stiffness in the hand with the colder weather.   pt accompanied by: self  PERTINENT HISTORY:  Ortho note 04/20/23 CALAN FENNEMA is a 62 y.o. female who presents today for a skin check status post multiple irrigation and debridements involving right dorsal and volar hand wounds and possible suture removal. The patient was admitted to the hospital after suffering a dog scratch which eventually became more swollen and painful. The patient underwent a CT scan of the right hand which demonstrated a abscess formation along the volar aspect of the hand initially. She underwent 1 irrigation debridement however the patient continued to have swelling and pain prompting a repeat CT scan which demonstrated abscess formation along the dorsal aspect of the hand. The patient underwent a second irrigation debridement and a final third procedure performed by Dr. Joice Lofts on 04/04/2023. The patient was evaluated last week where skin inspection demonstrated excellent granulation without any significant erythema or purulent drainage. The patient then had a follow-up with infectious disease where repeat CRP came back at a normal level. The patient does still have a oral prescription for linezolid which she is scheduled to take until end this week. She denies any trauma or injury affecting the right hand since her last evaluation. She does report an occasional burning discomfort in the index finger. She is still taking occasional oxycodone. She reports a 0 out of 10 pain  score at today's visit. Stitches remove- and to finish antibiotics and refer to OT. ON 07/05/2023 - Pt had by Dr Joice Lofts   Attempted closed manipulation under anesthesia,  then had open capsulectomies with release of radial and ulnar collateral ligaments of both index and long finger MCP joints.- refer to OT to start ROM , splinting and tx 3  days later    PRECAUTIONS: None     WEIGHT BEARING RESTRICTIONS: No  PAIN:  Are you having pain? 3-4/10 after the stretches   FALLS: Has patient fallen in last 6 months? No  LIVING ENVIRONMENT: Lives with: lives with their family and lives with their spouse   PLOF: Work on [employer], gym 3 x wk , do own cooking and housework  PATIENT GOALS: Get my motion and strength back in the right hand so that I can carry and grip and hold objects  NEXT MD VISIT: middle Dec OBJECTIVE:   HAND DOMINANCE: Right   UPPER EXTREMITY ROM:     Active ROM Right eval Left eval R  04/29/23 05/03/23 05/16/23 R 9/16/24R 06/02/23 R R 07/08/23 R 07/18/23 R 08/02/23 R 08/09/2023  Shoulder flexion             Shoulder abduction             Shoulder adduction             Shoulder extension             Shoulder internal rotation             Shoulder external rotation             Elbow flexion             Elbow extension             Wrist flexion 35 110 50 50 63 72 80 60 76    Wrist extension 48 85 60 55 65 70 70 50 67 78  78  Wrist ulnar deviation 24      35 30     Wrist radial deviation 20      25 20      Wrist pronation             Wrist supination             (Blank rows = not tested)  Active ROM Right eval Left eval R   04/29/23 R  05/12/23 R  05/16/23 R 05/23/23 R 06/02/23 R 06/09/23 R 07/08/23 R 07/13/23  In session R 07/22/23 R 08/02/23 R 08/09/2023  Thumb MCP (0-60) 70              Thumb IP (0-80) 35              Thumb Radial abd/add (0-55) 45    50  50 50        Thumb Palmar abd/add (0-45) 45    50  65 55        Thumb Opposition to Small Finger  Opposition to 3rd, 4th and 5th     Opposition to all digits   Opposition to base of 5th         Index MCP (0-90) 30   50 60 60( 65 66 70 70 75 75 75 75  Index PIP (0-100) 45( ext -30   55 ( ext -20 60 ( ext -10) 60 ( blocked 70) 60 66 70 50 70 70 70 70  Index DIP (0-70)       50          Long MCP (0-90)  45   50 60 65 70 70 75 ( 80 in session)  Ext -50 flexion65 80 75 80   Long PIP (0-100)  90( ext -20   95 ( ext -15 95 ( ext -5)  100 100 100 90 80 95  100   Long DIP (0-70)                 Ring MCP (0-90)  45   60 70 75 75 80 80( 90 in session Ext -45 flexion 70 90 90 85   Ring PIP (0-100)  100 (ext -10   100( ext 0  100 - full ext  100 100 100 100 95 95 100   Ring DIP (0-70)                 Little MCP (0-90)  75   75 80  80 85 85( 90 in session) 65 90 85 85   Little PIP (0-100)  90 (ext 0   100 ( ext 0 100- full ext   100 100 100 10 95 100 100   Little DIP (0-70)                 (Blank rows = not tested) I  05/23/23 GRIP R 14 lbs L 55 lbs, lat pinch R 4 lbs L 14 lbs and 3 point pinch R 5 lbs and L 10 lbs   05/30/23: Lat grip R 7 lbs , 14 L - 3 point R 6lbs , L 10 lbs   06/07/23 GRIP R 15 lbs L 55 lbs, lat pinch R 7 lbs L 14 lbs and 3 point pinch R 8 lbs and L 10 lbs 06/21/23 GRIP R 12 lbs L 48 lbs, lat pinch R 5 lbs L 14 lbs and 3 point pinch R 6 lbs and L 10 lbs 06/25/23 GRIP R 15 lbs L 48 lbs, lat pinch R 6lbs L 14 lbs and 3 point pinch R 8 lbs and L 10 lbs 08/02/23 GRIP R 19 lbs   COORDINATION: Decreasing significantly because of stiffness and scar tissue in the second digit  EDEMA: R hand   COGNITION: Overall cognitive status: Within functional limits for tasks assessed    TODAY'S TREATMENT:                                                                                                                              DATE:  08/11/23  Paraffin to right hand and wrist for 8 mins to decrease pain, stiffness and increase ROM.  Pt stop wearing daytime MC  block splint last Friday  -cont night ime MC block splint to increase MC flexion in splint to 80-90 flexion -at nighttime.   Manual Therapy:   Pt seen for scar massage, both scars to decrease adhesions and improve tissue mobilization for greater ROM issued new cica scar pad to use night time   Decreased edema noted, continues to utilize and wear isotoner glove for edema management and control  2 layers Moleskin inside dorsal hand-based custom MC block splint for cushioning over dorsal hand   2  times a day contrast followed by abduction and adduction of digits-- doing well Metacarpal flexion with IP extension -  to about 90  degrees in session - PROM 90 PROM to PIP/DIP flexion- - hold 15 sec x 10 With PROM - keeping MC at 90 - initially tight but increased in session in composite flexion Composite fist it palm for 3rd thru 5th -2 palm and a placed on hold.  Focusing on maintaining MC 90 Focus on place and hold into palm for 3rd thru 5th   Traction to wrist gentle with PROM wrist extension and composite flexion  8 x 30 sec  Cont at home with table slides 20 reps for patient she was unable to do wall push-up. Prior to AROM and AAROM   Squeezing into putty light blue easy- place and hold composite for 3rd thru 5th into putty  10-12 reps keeping pain under 2/10  teal med putty for lat pinch  and 3 point pinch - 2x day  12-15 reps- pain free  precision pinch into teal putty retrieving 1 cm small objects- - 2-3 x day - 8-10 objects   cont 3 pound weight for supination pronation support on thigh 12 reps. Followed by 3 pound weight for scapular squeezes, shoulder extension, overhead punches and elbow curls as well as triceps 2 sets of 12 1 time a day.   PATIENT EDUCATION: Education details: Findings of evaluation and home program Person educated: Patient Education method: Explanation, Demonstration, Tactile cues, Verbal cues, and Handouts Education comprehension: verbalized understanding, returned demonstration,  verbal cues required, tactile cues required, and needs further education   GOALS: Goals reviewed with patient? Yes  SHORT TERM GOALS: Target date: 4 wks  Patient to be independent in home program to decrease scar tissue and edema - increased extension and flexion of digits as well as wrist Baseline: Severe stiffness in digits, i 3 days ago surgery stitches in place  this date- Goal status: MET   LONG TERM GOALS: Target date: 8 wks Flexion of right digits improved for patient to touch palm to initiate use in ADLs with no increase symptoms Baseline: Surgery done 3 days ago -capsulectomy -MC flexion 65 to 70 degrees coming in.  PIP 52 degrees.   NOW 3rd thru 5th touching palm Goal status: progressing    2.  Patient increase extension of right digits 2nd thru 4th to be able to don gloves, wash face and reach aroundcylinder objects without issues Baseline: 3 days ago patient had capsulectomy.  Extension of metacarpals -50-second and -40 degrees at third NOW improved greatly -still not able to lay hand flat or do wall pushup Goal status: MET   3.  Scar tissue improved for patient to be able to make composite fist as well as increase extension to don gloves and put hand in pocket Baseline: 3 days ago had surgery.  Stitches in place.  Decreased flexion and extension with increased edema  goal status:progressing    4.  Right grip and prehension strength increased to more than 75% compared to the left for patient to hold weight to work out and cook Baseline: 3 days postop from capsulectomy with ligament release on second and third metacarpal no strength  tested Goal status: progressing   5.  R wrist AROM increase to WNL to push and pull door open without increase symptoms  Baseline:  wrist flxion 60, ext 50 Goal status: progressing   ASSESSMENT:  CLINICAL IMPRESSION: Patient presents at occupational therapy evaluation 3 days postop capsulectomy of right second and third metacarpal with  release of collateral ligaments.  Patient arrived with soft cast  in place.  Patient had earlier this year multiple irrigation and debridements involving right dorsal and volar hand wounds -with abscess and infection.  Patient was seen by therapy in September.  But limited by scar tissue and joint capsule tightness.   NOW pt with  great progress in [facility] and PIP flexion 3rd thru 5th - MC80-85 and PIP 100 -  2nd MC flexion 75 to 85 and PIP 70-80 degrees - tenderness mostly at 2nd [facility] -pt was able to maintain progress without wearing daytime  hand-based MC flexion splint-MC flexion to 80-90  but still to wear night time  Show decrease edema using isotoner glove and keep pain under 2/10 with A/PROM.  Focus on PIP/DIP flexion  and then into composite adding MC - focus on place and hold for 3rd thru 5th to palm -cont light blue putty for composite gripping -  Grip increase to 19 lbs , lat pinch 7lbs and 3 point was 8lbs  - good progress - Patient limited in functional use of right dominant hand.  Continued progress in all areas, edema decreased with use of compression glove, ROM improving but still has tightness in digits especially with colder weather this week, improving with composite flexion 3rd thru 5th digits and index improving but still lagging in passive motion.  Reminded pt to monitor her repetitions of resistive putty to avoid increased pain in the hand. Patient can benefit from skilled OT services to decrease edema, scar tissue and increase range of motion in flexion and extension of digits as well as wrist, increase strength to return to prior level of function. PERFORMANCE DEFICITS: in functional skills including ADLs, IADLs, coordination, dexterity, edema, ROM, strength, pain, flexibility, and UE functional use,   and psychosocial skills including habits and routines and behaviors.   IMPAIRMENTS: are limiting patient from ADLs, IADLs, rest and sleep, work, play, leisure, and social participation.    COMORBIDITIES: has no other co-morbidities that affects occupational performance. Patient will benefit from skilled OT to address above impairments and improve overall function.  MODIFICATION OR ASSISTANCE TO COMPLETE EVALUATION: No modification of tasks or assist necessary to complete an evaluation.  OT OCCUPATIONAL PROFILE AND HISTORY: Problem focused assessment: Including review of records relating to presenting problem.  CLINICAL DECISION MAKING: LOW - limited treatment options, no task modification necessary  REHAB POTENTIAL: Good  EVALUATION COMPLEXITY: Low  PLAN:  OT FREQUENCY: 3 x wk initially - decrease depending on progress  OT DURATION: 8 weeks  PLANNED INTERVENTIONS: ADL's therapeutic exercise, therapeutic activity, manual therapy, scar mobilization, passive range of motion, splinting, paraffin, fluidotherapy, contrast bath, patient/family education, and DME and/or AE instructions  CONSULTED AND AGREED WITH PLAN OF CARE: Patient   Oletta Cohn, OTR/L,CLT 08/11/2023, 4:15 PM

## 2023-08-12 ENCOUNTER — Encounter: Payer: No Typology Code available for payment source | Admitting: Occupational Therapy

## 2023-08-15 ENCOUNTER — Encounter: Payer: No Typology Code available for payment source | Admitting: Occupational Therapy

## 2023-08-16 ENCOUNTER — Ambulatory Visit: Payer: No Typology Code available for payment source | Admitting: Occupational Therapy

## 2023-08-16 DIAGNOSIS — M6281 Muscle weakness (generalized): Secondary | ICD-10-CM

## 2023-08-16 DIAGNOSIS — R600 Localized edema: Secondary | ICD-10-CM

## 2023-08-16 DIAGNOSIS — L905 Scar conditions and fibrosis of skin: Secondary | ICD-10-CM | POA: Diagnosis not present

## 2023-08-16 DIAGNOSIS — M25641 Stiffness of right hand, not elsewhere classified: Secondary | ICD-10-CM

## 2023-08-16 DIAGNOSIS — M25631 Stiffness of right wrist, not elsewhere classified: Secondary | ICD-10-CM

## 2023-08-16 NOTE — Therapy (Signed)
OUTPATIENT OCCUPATIONAL THERAPY ORTHO  TREATMENT  Patient Name: Wanda Smith MRN: [id number] DOB:[birth year], 62 y.o., female   PCP: Merlinda Frederick NP REFERRING PROVIDER: Marney Doctor PA  END OF SESSION:  OT End of Session - 08/16/23 0832     Visit Number 28    Number of Visits 38    Date for OT Re-Evaluation 09/26/23    OT Start Time 0816    OT Stop Time 0901    OT Time Calculation (min) 45 min    Activity Tolerance Patient tolerated treatment well    Behavior During Therapy [facility] for tasks assessed/performed             Past Medical History:  Diagnosis Date   Anemia    Anxiety    Arthritis    knee (R)   Asthma    Carpal tunnel syndrome    Cellulitis of right hand 03/30/2023   Chronic kidney disease, stage 3a (HCC)    Dr Cherylann Ratel   COPD (chronic obstructive pulmonary disease) (HCC)    Depression    Diabetes mellitus without complication (HCC)    Dyspnea    GERD (gastroesophageal reflux disease)    occ   Hyperlipidemia    Hypertension    Hypothyroidism    Increased serum lipids    MRSA infection 03/30/2023   right hand   Osteoporosis    Severe sepsis (HCC) 03/30/2023   Type 2 diabetes mellitus with renal manifestations (HCC)    Wears contact lenses    Past Surgical History:  Procedure Laterality Date   ABDOMINAL HYSTERECTOMY  1998   CARPAL TUNNEL RELEASE Right 07/26/2018   Procedure: CARPAL TUNNEL RELEASE ENDOSCOPIC;  Surgeon: Christena Flake, MD;  Location: [facility];  Service: Orthopedics;  Laterality: Right;  diabetic - oral meds   CHOLECYSTECTOMY  2013   CLOSED REDUCTION METACARPAL WITH PERCUTANEOUS PINNING Right 07/05/2023   Procedure: MANIPULATION UNDER ANESTHESIA OF RIGHT INDEX AND LONG FINGERS WITH POSSIBLE CAPSULAR OF RIGHT INDEX AND LONG MCP JOINTS;  Surgeon: Christena Flake, MD;  Location: [facility];  Service: Orthopedics;  Laterality: Right;   COLONOSCOPY WITH ESOPHAGOGASTRODUODENOSCOPY (EGD)  03/03/2012   COLONOSCOPY WITH  ESOPHAGOGASTRODUODENOSCOPY (EGD)  02/23/2022   COLONOSCOPY WITH ESOPHAGOGASTRODUODENOSCOPY (EGD)  01/16/1991   COLONOSCOPY WITH PROPOFOL N/A 01/13/2018   Procedure: COLONOSCOPY WITH PROPOFOL;  Surgeon: Scot Jun, MD;  Location: [facility] END[facility];  Service: Endoscopy;  Laterality: N/A;   HERNIA REPAIR  1966   I & D EXTREMITY Right 04/01/2023   Procedure: IRRIGATION AND DEBRIDEMENT EXTREMITY PREVENA PLACEMENT;  Surgeon: Christena Flake, MD;  Location: [facility];  Service: Orthopedics;  Laterality: Right;   I-131 THYROID ABLATION     INCISION AND DRAINAGE ABSCESS Right 03/30/2023   Procedure: INCISION AND DRAINAGE ABSCESS RIGHT HAND;  Surgeon: Christena Flake, MD;  Location: [facility];  Service: Orthopedics;  Laterality: Right;   INCISION AND DRAINAGE OF WOUND Right 04/04/2023   Procedure: IRRIGATION AND DEBRIDEMENT RIGHT HAND DORSAL AND PALMAR INCISIONS WITH DELAYED PRIMARY CLOSURE;  Surgeon: Christena Flake, MD;  Location: [facility];  Service: Orthopedics;  Laterality: Right;   ROTATOR CUFF REPAIR Left 2011   SHOULDER ARTHROSCOPY WITH SUBACROMIAL DECOMPRESSION, ROTATOR CUFF REPAIR AND BICEP TENDON REPAIR Right 10/21/2021   Procedure: SHOULDER ARTHROSCOPY WITH DEBRIDEMENT, DECOMPRESSION, BICEPS TENODESIS.;  Surgeon: Christena Flake, MD;  Location: [facility];  Service: Orthopedics;  Laterality: Right;   TUBAL LIGATION  1993   Patient Active Problem List   Diagnosis Date  Noted   MRSA infection 04/04/2023   Abscess of right hand 03/31/2023   Cellulitis of right hand 03/30/2023   Depression with anxiety 03/30/2023   HLD (hyperlipidemia) 03/30/2023   Type II diabetes mellitus with renal manifestations (HCC) 03/30/2023   Chronic kidney disease, stage 3a (HCC) 03/30/2023   Abnormal LFTs 03/30/2023   Severe sepsis (HCC) 03/30/2023   Hypothyroidism    Hypertension    COPD (chronic obstructive pulmonary disease) (HCC)     ONSET DATE: 03/30/23  REFERRING DIAG:Abscess of R hand -3 irrigation  surgeries/Capsulectomy 2nd and 3rd MC - release of collateral ligaments  THERAPY DIAG:  Scar condition and fibrosis of skin  Localized edema  Muscle weakness (generalized)  Stiffness of right hand, not elsewhere classified  Stiffness of right wrist, not elsewhere classified  Rationale for Evaluation and Treatment: Rehabilitation  SUBJECTIVE:   SUBJECTIVE STATEMENT: [place] just came [place] yesterday from [place].  And with this wet weather my hand just feels stiff. Tight  pt accompanied by: self  PERTINENT HISTORY:  Ortho note 04/20/23 [name] is a 62 y.o. female who presents today for a skin check status post multiple irrigation and debridements involving right dorsal and volar hand wounds and possible suture removal. The patient was admitted to the hospital after suffering a dog scratch which eventually became more swollen and painful. The patient underwent a CT scan of the right hand which demonstrated a abscess formation along the volar aspect of the hand initially. She underwent 1 irrigation debridement however the patient continued to have swelling and pain prompting a repeat CT scan which demonstrated abscess formation along the dorsal aspect of the hand. The patient underwent a second irrigation debridement and a final third procedure performed by Dr. Joice Lofts on 04/04/2023. The patient was evaluated last week where skin inspection demonstrated excellent granulation without any significant erythema or purulent drainage. The patient then had a follow-up with infectious disease where repeat CRP came back at a normal level. The patient does still have a oral prescription for linezolid which she is scheduled to take until end this week. She denies any trauma or injury affecting the right hand since her last evaluation. She does report an occasional burning discomfort in the index finger. She is still taking occasional oxycodone. She reports a 0 out of 10 pain score at today's visit.  Stitches remove- and to finish antibiotics and refer to OT. ON 07/05/2023 - Pt had by Dr Joice Lofts   Attempted closed manipulation under anesthesia,  then had open capsulectomies with release of radial and ulnar collateral ligaments of both index and long finger MCP joints.- refer to OT to start ROM , splinting and tx 3 days later    PRECAUTIONS: None     WEIGHT BEARING RESTRICTIONS: No  PAIN:  Are you having pain?  More stiffness than pain  FALLS: Has patient fallen in last 6 months? No  LIVING ENVIRONMENT: Lives with: lives with their family and lives with their spouse   PLOF: Work on [employer], gym 3 x wk , do own cooking and housework  PATIENT GOALS: Get my motion and strength back in the right hand so that I can carry and grip and hold objects  NEXT MD VISIT: middle Dec OBJECTIVE:   HAND DOMINANCE: Right   UPPER EXTREMITY ROM:     Active ROM Right eval Left eval R  04/29/23 05/03/23 05/16/23 R 9/16/24R 06/02/23 R R 07/08/23 R 07/18/23 R 08/02/23 R 08/09/2023  Shoulder flexion  Shoulder abduction             Shoulder adduction             Shoulder extension             Shoulder internal rotation             Shoulder external rotation             Elbow flexion             Elbow extension             Wrist flexion 35 110 50 50 63 72 80 60 76    Wrist extension 48 85 60 55 65 70 70 50 67 78  78  Wrist ulnar deviation 24      35 30     Wrist radial deviation 20      25 20      Wrist pronation             Wrist supination             (Blank rows = not tested)  Active ROM Right eval Left eval R   04/29/23 R  05/12/23 R  05/16/23 R 05/23/23 R 06/02/23 R 06/09/23 R 07/08/23 R 07/13/23  In session R 07/22/23 R 08/02/23 R 08/09/2023  Thumb MCP (0-60) 70              Thumb IP (0-80) 35              Thumb Radial abd/add (0-55) 45    50  50 50        Thumb Palmar abd/add (0-45) 45    50  65 55        Thumb Opposition to Small Finger Opposition to 3rd, 4th and 5th      Opposition to all digits   Opposition to base of 5th         Index MCP (0-90) 30   50 60 60( 65 66 70 70 75 75 75 75  Index PIP (0-100) 45( ext -30   55 ( ext -20 60 ( ext -10) 60 ( blocked 70) 60 66 70 50 70 70 70 70  Index DIP (0-70)       50          Long MCP (0-90)  45   50 60 65 70 70 75 ( 80 in session)  Ext -50 flexion65 80 75 80   Long PIP (0-100)  90( ext -20   95 ( ext -15 95 ( ext -5)  100 100 100 90 80 95 100   Long DIP (0-70)                 Ring MCP (0-90)  45   60 70 75 75 80 80( 90 in session Ext -45 flexion 70 90 90 85   Ring PIP (0-100)  100 (ext -10   100( ext 0  100 - full ext  100 100 100 100 95 95 100   Ring DIP (0-70)                 Little MCP (0-90)  75   75 80  80 85 85( 90 in session) 65 90 85 85   Little PIP (0-100)  90 (ext 0   100 ( ext 0 100- full ext   100 100 100 10 95 100 100   Little DIP (0-70)                 (  Blank rows = not tested) I  05/23/23 GRIP R 14 lbs L 55 lbs, lat pinch R 4 lbs L 14 lbs and 3 point pinch R 5 lbs and L 10 lbs   05/30/23: Lat grip R 7 lbs , 14 L - 3 point R 6lbs , L 10 lbs   06/07/23 GRIP R 15 lbs L 55 lbs, lat pinch R 7 lbs L 14 lbs and 3 point pinch R 8 lbs and L 10 lbs 06/21/23 GRIP R 12 lbs L 48 lbs, lat pinch R 5 lbs L 14 lbs and 3 point pinch R 6 lbs and L 10 lbs 06/25/23 GRIP R 15 lbs L 48 lbs, lat pinch R 6lbs L 14 lbs and 3 point pinch R 8 lbs and L 10 lbs 08/02/23 GRIP R 19 lbs  08/16/23 Grip R 25 lbs, 48 lbs, L, Lat pinch R 7 lbs and L 14 lbs   COORDINATION: Decreasing significantly because of stiffness and scar tissue in the second digit  EDEMA: R hand   COGNITION: Overall cognitive status: Within functional limits for tasks assessed    TODAY'S TREATMENT:                                                                                                                              DATE:  08/11/23  Patient arrive with [facility] flexion 3rd through 5th 80 to 85 degrees and PIP is 100 Second MC 70 and PIP 65. Flexion   MC 3rd through 5th increased to 85-90 degrees, and second to 80 degrees MCP and PIP 75 Done fluidotherapy with patient with active range of motion for digits in all range of motion  Pt to cont with Knuckle Bender splint with 3 bands on the radial side and ulnar side 5 bands During provide MC flexion stretch. Patient to wear 5 minutes after which she works on composite flexion placed on hold to bar and palm After removing it at the max can wear 15 minutes Work on composite placing hold flexion of digits    Also upgrade to green putty for lat pinch  and 3 point pinch - 2x day  12-15 reps- pain free  precision pinch into green putty retrieving 1 cm small objects- - 2-3 x day - 8-10 objects   cont 3 pound weight for supination pronation support on thigh 12 reps. Followed by 4 pound weight for scapular squeezes, shoulder extension, overhead punches and elbow curls as well as triceps 2 sets of 12 1 time a day.   PATIENT EDUCATION: Education details: Findings of evaluation and home program Person educated: Patient Education method: Explanation, Demonstration, Tactile cues, Verbal cues, and Handouts Education comprehension: verbalized understanding, returned demonstration, verbal cues required, tactile cues required, and needs further education   GOALS: Goals reviewed with patient? Yes  SHORT TERM GOALS: Target date: 4 wks  Patient to be independent in home program to decrease scar tissue and edema - increased  extension and flexion of digits as well as wrist Baseline: Severe stiffness in digits, i 3 days ago surgery stitches in place  this date- Goal status: MET   LONG TERM GOALS: Target date: 8 wks Flexion of right digits improved for patient to touch palm to initiate use in ADLs with no increase symptoms Baseline: Surgery done 3 days ago -capsulectomy -MC flexion 65 to 70 degrees coming in.  PIP 52 degrees.   NOW 3rd thru 5th touching palm Goal status: progressing    2.  Patient  increase extension of right digits 2nd thru 4th to be able to don gloves, wash face and reach aroundcylinder objects without issues Baseline: 3 days ago patient had capsulectomy.  Extension of metacarpals -50-second and -40 degrees at third NOW improved greatly -still not able to lay hand flat or do wall pushup Goal status: MET   3.  Scar tissue improved for patient to be able to make composite fist as well as increase extension to don gloves and put hand in pocket Baseline: 3 days ago had surgery.  Stitches in place.  Decreased flexion and extension with increased edema  goal status:progressing    4.  Right grip and prehension strength increased to more than 75% compared to the left for patient to hold weight to work out and cook Baseline: 3 days postop from capsulectomy with ligament release on second and third metacarpal no strength  tested Goal status: progressing   5.  R wrist AROM increase to WNL to push and pull door open without increase symptoms  Baseline:  wrist flxion 60, ext 50 Goal status: progressing   ASSESSMENT:  CLINICAL IMPRESSION: Patient presents at occupational therapy evaluation 3 days postop capsulectomy of right second and third metacarpal with release of collateral ligaments.  Patient arrived with soft cast in place.  Patient had earlier this year multiple irrigation and debridements involving right dorsal and volar hand wounds -with abscess and infection.  Patient was seen by therapy in September.  But limited by scar tissue and joint capsule tightness.   NOW patient progressing very well and 3rd-5th digits for composite fist.  Only limiting in last 5 to 10 degrees of MC flexion.  Second digit improving during session able to maintain progress better.  Able to upgrade patient's putty resistance.  Grip increased to 25 pounds. Patient limited in functional use of right dominant hand.  Continued progress in all areas, edema decreased with use of compression glove, ROM  improving but still has tightness in digits especially with colder weather this week.  Reminded pt to monitor her repetitions of resistive putty to avoid increased pain in the hand.  Patient can discharge tonight wearing of MC block splint.  Patient can benefit from skilled OT services to decrease edema, scar tissue and increase range of motion in flexion and extension of digits as well as wrist, increase strength to return to prior level of function. PERFORMANCE DEFICITS: in functional skills including ADLs, IADLs, coordination, dexterity, edema, ROM, strength, pain, flexibility, and UE functional use,   and psychosocial skills including habits and routines and behaviors.   IMPAIRMENTS: are limiting patient from ADLs, IADLs, rest and sleep, work, play, leisure, and social participation.   COMORBIDITIES: has no other co-morbidities that affects occupational performance. Patient will benefit from skilled OT to address above impairments and improve overall function.  MODIFICATION OR ASSISTANCE TO COMPLETE EVALUATION: No modification of tasks or assist necessary to complete an evaluation.  OT OCCUPATIONAL PROFILE AND HISTORY:  Problem focused assessment: Including review of records relating to presenting problem.  CLINICAL DECISION MAKING: LOW - limited treatment options, no task modification necessary  REHAB POTENTIAL: Good  EVALUATION COMPLEXITY: Low  PLAN:  OT FREQUENCY: 3 x wk initially - decrease depending on progress  OT DURATION: 8 weeks  PLANNED INTERVENTIONS: ADL's therapeutic exercise, therapeutic activity, manual therapy, scar mobilization, passive range of motion, splinting, paraffin, fluidotherapy, contrast bath, patient/family education, and DME and/or AE instructions  CONSULTED AND AGREED WITH PLAN OF CARE: Patient   Oletta Cohn, OTR/L,CLT 08/16/2023, 1:07 PM    OUTPATIENT OCCUPATIONAL THERAPY ORTHO  TREATMENT  Patient Name: KENSLEY CASCELLA MRN:  [id number] DOB:11/04/1960, 62 y.o., female   PCP: Merlinda Frederick NP REFERRING PROVIDER: Marney Doctor PA  END OF SESSION:  OT End of Session - 08/16/23 0832     Visit Number 28    Number of Visits 38    Date for OT Re-Evaluation 09/26/23    OT Start Time 0816    OT Stop Time 0901    OT Time Calculation (min) 45 min    Activity Tolerance Patient tolerated treatment well    Behavior During Therapy [facility] for tasks assessed/performed             Past Medical History:  Diagnosis Date   Anemia    Anxiety    Arthritis    knee (R)   Asthma    Carpal tunnel syndrome    Cellulitis of right hand 03/30/2023   Chronic kidney disease, stage 3a (HCC)    Dr Cherylann Ratel   COPD (chronic obstructive pulmonary disease) (HCC)    Depression    Diabetes mellitus without complication (HCC)    Dyspnea    GERD (gastroesophageal reflux disease)    occ   Hyperlipidemia    Hypertension    Hypothyroidism    Increased serum lipids    MRSA infection 03/30/2023   right hand   Osteoporosis    Severe sepsis (HCC) 03/30/2023   Type 2 diabetes mellitus with renal manifestations (HCC)    Wears contact lenses    Past Surgical History:  Procedure Laterality Date   ABDOMINAL HYSTERECTOMY  1998   CARPAL TUNNEL RELEASE Right 07/26/2018   Procedure: CARPAL TUNNEL RELEASE ENDOSCOPIC;  Surgeon: Christena Flake, MD;  Location: [facility];  Service: Orthopedics;  Laterality: Right;  diabetic - oral meds   CHOLECYSTECTOMY  2013   CLOSED REDUCTION METACARPAL WITH PERCUTANEOUS PINNING Right 07/05/2023   Procedure: MANIPULATION UNDER ANESTHESIA OF RIGHT INDEX AND LONG FINGERS WITH POSSIBLE CAPSULAR OF RIGHT INDEX AND LONG MCP JOINTS;  Surgeon: Christena Flake, MD;  Location: [facility];  Service: Orthopedics;  Laterality: Right;   COLONOSCOPY WITH ESOPHAGOGASTRODUODENOSCOPY (EGD)  03/03/2012   COLONOSCOPY WITH ESOPHAGOGASTRODUODENOSCOPY (EGD)  02/23/2022   COLONOSCOPY WITH ESOPHAGOGASTRODUODENOSCOPY (EGD)  01/16/1991    COLONOSCOPY WITH PROPOFOL N/A 01/13/2018   Procedure: COLONOSCOPY WITH PROPOFOL;  Surgeon: Scot Jun, MD;  Location: [facility];  Service: Endoscopy;  Laterality: N/A;   HERNIA REPAIR  1966   I & D EXTREMITY Right 04/01/2023   Procedure: IRRIGATION AND DEBRIDEMENT EXTREMITY PREVENA PLACEMENT;  Surgeon: Christena Flake, MD;  Location: [facility];  Service: Orthopedics;  Laterality: Right;   I-131 THYROID ABLATION     INCISION AND DRAINAGE ABSCESS Right 03/30/2023   Procedure: INCISION AND DRAINAGE ABSCESS RIGHT HAND;  Surgeon: Christena Flake, MD;  Location: [facility];  Service: Orthopedics;  Laterality: Right;   INCISION AND DRAINAGE OF WOUND  Right 04/04/2023   Procedure: IRRIGATION AND DEBRIDEMENT RIGHT HAND DORSAL AND PALMAR INCISIONS WITH DELAYED PRIMARY CLOSURE;  Surgeon: Christena Flake, MD;  Location: [facility];  Service: Orthopedics;  Laterality: Right;   ROTATOR CUFF REPAIR Left 2011   SHOULDER ARTHROSCOPY WITH SUBACROMIAL DECOMPRESSION, ROTATOR CUFF REPAIR AND BICEP TENDON REPAIR Right 10/21/2021   Procedure: SHOULDER ARTHROSCOPY WITH DEBRIDEMENT, DECOMPRESSION, BICEPS TENODESIS.;  Surgeon: Christena Flake, MD;  Location: [facility];  Service: Orthopedics;  Laterality: Right;   TUBAL LIGATION  1993   Patient Active Problem List   Diagnosis Date Noted   MRSA infection 04/04/2023   Abscess of right hand 03/31/2023   Cellulitis of right hand 03/30/2023   Depression with anxiety 03/30/2023   HLD (hyperlipidemia) 03/30/2023   Type II diabetes mellitus with renal manifestations (HCC) 03/30/2023   Chronic kidney disease, stage 3a (HCC) 03/30/2023   Abnormal LFTs 03/30/2023   Severe sepsis (HCC) 03/30/2023   Hypothyroidism    Hypertension    COPD (chronic obstructive pulmonary disease) (HCC)     ONSET DATE: 03/30/23  REFERRING DIAG:Abscess of R hand -3 irrigation surgeries/Capsulectomy 2nd and 3rd MC - release of collateral ligaments  THERAPY DIAG:  Scar condition and fibrosis of  skin  Localized edema  Muscle weakness (generalized)  Stiffness of right hand, not elsewhere classified  Stiffness of right wrist, not elsewhere classified  Rationale for Evaluation and Treatment: Rehabilitation  SUBJECTIVE:   SUBJECTIVE STATEMENT: Pt reports she is doing well, just has some increased aching and stiffness in the hand with the colder weather.   pt accompanied by: self  PERTINENT HISTORY:  Ortho note 04/20/23 Wanda Smith is a 62 y.o. female who presents today for a skin check status post multiple irrigation and debridements involving right dorsal and volar hand wounds and possible suture removal. The patient was admitted to the hospital after suffering a dog scratch which eventually became more swollen and painful. The patient underwent a CT scan of the right hand which demonstrated a abscess formation along the volar aspect of the hand initially. She underwent 1 irrigation debridement however the patient continued to have swelling and pain prompting a repeat CT scan which demonstrated abscess formation along the dorsal aspect of the hand. The patient underwent a second irrigation debridement and a final third procedure performed by Dr. Joice Lofts on 04/04/2023. The patient was evaluated last week where skin inspection demonstrated excellent granulation without any significant erythema or purulent drainage. The patient then had a follow-up with infectious disease where repeat CRP came back at a normal level. The patient does still have a oral prescription for linezolid which she is scheduled to take until end this week. She denies any trauma or injury affecting the right hand since her last evaluation. She does report an occasional burning discomfort in the index finger. She is still taking occasional oxycodone. She reports a 0 out of 10 pain score at today's visit. Stitches remove- and to finish antibiotics and refer to OT. ON 07/05/2023 - Pt had by Dr Joice Lofts   Attempted closed  manipulation under anesthesia,  then had open capsulectomies with release of radial and ulnar collateral ligaments of both index and long finger MCP joints.- refer to OT to start ROM , splinting and tx 3 days later    PRECAUTIONS: None     WEIGHT BEARING RESTRICTIONS: No  PAIN:  Are you having pain? 3-4/10 after the stretches   FALLS: Has patient fallen in last 6 months? No  LIVING ENVIRONMENT: Lives with: lives with their family and lives with their spouse   PLOF: Work on [employer], gym 3 x wk , do own cooking and housework  PATIENT GOALS: Get my motion and strength back in the right hand so that I can carry and grip and hold objects  NEXT MD VISIT: middle Dec OBJECTIVE:   HAND DOMINANCE: Right   UPPER EXTREMITY ROM:     Active ROM Right eval Left eval R  04/29/23 05/03/23 05/16/23 R 9/16/24R 06/02/23 R R 07/08/23 R 07/18/23 R 08/02/23 R 08/09/2023  Shoulder flexion             Shoulder abduction             Shoulder adduction             Shoulder extension             Shoulder internal rotation             Shoulder external rotation             Elbow flexion             Elbow extension             Wrist flexion 35 110 50 50 63 72 80 60 76    Wrist extension 48 85 60 55 65 70 70 50 67 78  78  Wrist ulnar deviation 24      35 30     Wrist radial deviation 20      25 20      Wrist pronation             Wrist supination             (Blank rows = not tested)  Active ROM Right eval Left eval R   04/29/23 R  05/12/23 R  05/16/23 R 05/23/23 R 06/02/23 R 06/09/23 R 07/08/23 R 07/13/23  In session R 07/22/23 R 08/02/23 R 08/09/2023  Thumb MCP (0-60) 70              Thumb IP (0-80) 35              Thumb Radial abd/add (0-55) 45    50  50 50        Thumb Palmar abd/add (0-45) 45    50  65 55        Thumb Opposition to Small Finger Opposition to 3rd, 4th and 5th     Opposition to all digits   Opposition to base of 5th         Index MCP (0-90) 30   50 60 60( 65 66 70 70 75  75 75 75  Index PIP (0-100) 45( ext -30   55 ( ext -20 60 ( ext -10) 60 ( blocked 70) 60 66 70 50 70 70 70 70  Index DIP (0-70)       50          Long MCP (0-90)  45   50 60 65 70 70 75 ( 80 in session)  Ext -50 flexion65 80 75 80   Long PIP (0-100)  90( ext -20   95 ( ext -15 95 ( ext -5)  100 100 100 90 80 95 100   Long DIP (0-70)                 Ring MCP (0-90)  45   60 70 75 75 80 80( 90 in session  Ext -45 flexion 70 90 90 85   Ring PIP (0-100)  100 (ext -10   100( ext 0  100 - full ext  100 100 100 100 95 95 100   Ring DIP (0-70)                 Little MCP (0-90)  75   75 80  80 85 85( 90 in session) 65 90 85 85   Little PIP (0-100)  90 (ext 0   100 ( ext 0 100- full ext   100 100 100 10 95 100 100   Little DIP (0-70)                 (Blank rows = not tested) I  05/23/23 GRIP R 14 lbs L 55 lbs, lat pinch R 4 lbs L 14 lbs and 3 point pinch R 5 lbs and L 10 lbs   05/30/23: Lat grip R 7 lbs , 14 L - 3 point R 6lbs , L 10 lbs   06/07/23 GRIP R 15 lbs L 55 lbs, lat pinch R 7 lbs L 14 lbs and 3 point pinch R 8 lbs and L 10 lbs 06/21/23 GRIP R 12 lbs L 48 lbs, lat pinch R 5 lbs L 14 lbs and 3 point pinch R 6 lbs and L 10 lbs 06/25/23 GRIP R 15 lbs L 48 lbs, lat pinch R 6lbs L 14 lbs and 3 point pinch R 8 lbs and L 10 lbs 08/02/23 GRIP R 19 lbs   COORDINATION: Decreasing significantly because of stiffness and scar tissue in the second digit  EDEMA: R hand   COGNITION: Overall cognitive status: Within functional limits for tasks assessed    TODAY'S TREATMENT:                                                                                                                              DATE:  08/11/23  Paraffin to right hand and wrist for 8 mins to decrease pain, stiffness and increase ROM.  Pt stop wearing daytime MC  block splint last Friday -cont night ime MC block splint to increase MC flexion in splint to 80-90 flexion -at nighttime.   Manual Therapy:   Pt seen for scar massage,  both scars to decrease adhesions and improve tissue mobilization for greater ROM issued new cica scar pad to use night time   Decreased edema noted, continues to utilize and wear isotoner glove for edema management and control  2 layers Moleskin inside dorsal hand-based custom MC block splint for cushioning over dorsal hand   2  times a day contrast followed by abduction and adduction of digits-- doing well Metacarpal flexion with IP extension - to about 90  degrees in session - PROM 90 PROM to PIP/DIP flexion- - hold 15 sec x 10 With PROM - keeping MC at 90 - initially tight but increased in session in composite flexion Composite  fist it palm for 3rd thru 5th -2 palm and a placed on hold.  Focusing on maintaining MC 90 Focus on place and hold into palm for 3rd thru 5th   Traction to wrist gentle with PROM wrist extension and composite flexion  8 x 30 sec  Cont at home with table slides 20 reps for patient she was unable to do wall push-up. Prior to AROM and AAROM   Squeezing into putty light blue easy- place and hold composite for 3rd thru 5th into putty  10-12 reps keeping pain under 2/10  teal med putty for lat pinch  and 3 point pinch - 2x day  12-15 reps- pain free  precision pinch into teal putty retrieving 1 cm small objects- - 2-3 x day - 8-10 objects   cont 3 pound weight for supination pronation support on thigh 12 reps. Followed by 3 pound weight for scapular squeezes, shoulder extension, overhead punches and elbow curls as well as triceps 2 sets of 12 1 time a day.   PATIENT EDUCATION: Education details: Findings of evaluation and home program Person educated: Patient Education method: Explanation, Demonstration, Tactile cues, Verbal cues, and Handouts Education comprehension: verbalized understanding, returned demonstration, verbal cues required, tactile cues required, and needs further education   GOALS: Goals reviewed with patient? Yes  SHORT TERM GOALS: Target  date: 4 wks  Patient to be independent in home program to decrease scar tissue and edema - increased extension and flexion of digits as well as wrist Baseline: Severe stiffness in digits, i 3 days ago surgery stitches in place  this date- Goal status: MET   LONG TERM GOALS: Target date: 8 wks Flexion of right digits improved for patient to touch palm to initiate use in ADLs with no increase symptoms Baseline: Surgery done 3 days ago -capsulectomy -MC flexion 65 to 70 degrees coming in.  PIP 52 degrees.   NOW 3rd thru 5th touching palm Goal status: progressing    2.  Patient increase extension of right digits 2nd thru 4th to be able to don gloves, wash face and reach aroundcylinder objects without issues Baseline: 3 days ago patient had capsulectomy.  Extension of metacarpals -50-second and -40 degrees at third NOW improved greatly -still not able to lay hand flat or do wall pushup Goal status: MET   3.  Scar tissue improved for patient to be able to make composite fist as well as increase extension to don gloves and put hand in pocket Baseline: 3 days ago had surgery.  Stitches in place.  Decreased flexion and extension with increased edema  goal status:progressing    4.  Right grip and prehension strength increased to more than 75% compared to the left for patient to hold weight to work out and cook Baseline: 3 days postop from capsulectomy with ligament release on second and third metacarpal no strength  tested Goal status: progressing   5.  R wrist AROM increase to WNL to push and pull door open without increase symptoms  Baseline:  wrist flxion 60, ext 50 Goal status: progressing   ASSESSMENT:  CLINICAL IMPRESSION: Patient presents at occupational therapy evaluation 3 days postop capsulectomy of right second and third metacarpal with release of collateral ligaments.  Patient arrived with soft cast in place.  Patient had earlier this year multiple irrigation and debridements  involving right dorsal and volar hand wounds -with abscess and infection.  Patient was seen by therapy in September.  But limited by scar tissue and  joint capsule tightness.   NOW pt with  great progress in [facility] and PIP flexion 3rd thru 5th - MC80-85 and PIP 100 -  2nd MC flexion 75 to 85 and PIP 70-80 degrees - tenderness mostly at 2nd [facility] [place] -pt was able to maintain progress without wearing daytime  hand-based MC flexion splint-MC flexion to 80-90  but still to wear night time  Show decrease edema using isotoner glove and keep pain under 2/10 with A/PROM.  Focus on PIP/DIP flexion  and then into composite adding MC - focus on place and hold for 3rd thru 5th to palm -cont light blue putty for composite gripping -  Grip increase to 19 lbs , lat pinch 7lbs and 3 point was 8lbs  - good progress - Patient limited in functional use of right dominant hand.  Continued progress in all areas, edema decreased with use of compression glove, ROM improving but still has tightness in digits especially with colder weather this week, improving with composite flexion 3rd thru 5th digits and index improving but still lagging in passive motion.  Reminded pt to monitor her repetitions of resistive putty to avoid increased pain in the hand. Patient can benefit from skilled OT services to decrease edema, scar tissue and increase range of motion in flexion and extension of digits as well as wrist, increase strength to return to prior level of function. PERFORMANCE DEFICITS: in functional skills including ADLs, IADLs, coordination, dexterity, edema, ROM, strength, pain, flexibility, and UE functional use,   and psychosocial skills including habits and routines and behaviors.   IMPAIRMENTS: are limiting patient from ADLs, IADLs, rest and sleep, work, play, leisure, and social participation.   COMORBIDITIES: has no other co-morbidities that affects occupational performance. Patient will benefit from skilled OT to address above impairments  and improve overall function.  MODIFICATION OR ASSISTANCE TO COMPLETE EVALUATION: No modification of tasks or assist necessary to complete an evaluation.  OT OCCUPATIONAL PROFILE AND HISTORY: Problem focused assessment: Including review of records relating to presenting problem.  CLINICAL DECISION MAKING: LOW - limited treatment options, no task modification necessary  REHAB POTENTIAL: Good  EVALUATION COMPLEXITY: Low  PLAN:  OT FREQUENCY: 3 x wk initially - decrease depending on progress  OT DURATION: 8 weeks  PLANNED INTERVENTIONS: ADL's therapeutic exercise, therapeutic activity, manual therapy, scar mobilization, passive range of motion, splinting, paraffin, fluidotherapy, contrast bath, patient/family education, and DME and/or AE instructions  CONSULTED AND AGREED WITH PLAN OF CARE: Patient   Oletta Cohn, OTR/L,CLT 08/16/2023, 1:07 PM

## 2023-08-18 ENCOUNTER — Ambulatory Visit: Payer: No Typology Code available for payment source | Admitting: Occupational Therapy

## 2023-08-22 ENCOUNTER — Encounter: Payer: Self-pay | Admitting: Occupational Therapy

## 2023-08-22 ENCOUNTER — Ambulatory Visit: Payer: No Typology Code available for payment source | Admitting: Occupational Therapy

## 2023-08-22 DIAGNOSIS — L905 Scar conditions and fibrosis of skin: Secondary | ICD-10-CM

## 2023-08-22 DIAGNOSIS — M25631 Stiffness of right wrist, not elsewhere classified: Secondary | ICD-10-CM

## 2023-08-22 DIAGNOSIS — R600 Localized edema: Secondary | ICD-10-CM

## 2023-08-22 DIAGNOSIS — M25641 Stiffness of right hand, not elsewhere classified: Secondary | ICD-10-CM

## 2023-08-22 DIAGNOSIS — M6281 Muscle weakness (generalized): Secondary | ICD-10-CM

## 2023-08-24 NOTE — Therapy (Signed)
OUTPATIENT OCCUPATIONAL THERAPY ORTHO  TREATMENT  Patient Name: Wanda Smith MRN: [id number] DOB:12/07/1960, 62 y.o., female   PCP: Merlinda Frederick NP REFERRING PROVIDER: Marney Doctor PA  END OF SESSION:  OT End of Session - 08/24/23 1735     Visit Number 29    Number of Visits 38    Date for OT Re-Evaluation 09/26/23    OT Start Time 0816    OT Stop Time 0901    OT Time Calculation (min) 45 min    Activity Tolerance Patient tolerated treatment well    Behavior During Therapy [facility] for tasks assessed/performed             Past Medical History:  Diagnosis Date   Anemia    Anxiety    Arthritis    knee (R)   Asthma    Carpal tunnel syndrome    Cellulitis of right hand 03/30/2023   Chronic kidney disease, stage 3a (HCC)    Dr Cherylann Ratel   COPD (chronic obstructive pulmonary disease) (HCC)    Depression    Diabetes mellitus without complication (HCC)    Dyspnea    GERD (gastroesophageal reflux disease)    occ   Hyperlipidemia    Hypertension    Hypothyroidism    Increased serum lipids    MRSA infection 03/30/2023   right hand   Osteoporosis    Severe sepsis (HCC) 03/30/2023   Type 2 diabetes mellitus with renal manifestations (HCC)    Wears contact lenses    Past Surgical History:  Procedure Laterality Date   ABDOMINAL HYSTERECTOMY  1998   CARPAL TUNNEL RELEASE Right 07/26/2018   Procedure: CARPAL TUNNEL RELEASE ENDOSCOPIC;  Surgeon: Christena Flake, MD;  Location: [facility];  Service: Orthopedics;  Laterality: Right;  diabetic - oral meds   CHOLECYSTECTOMY  2013   CLOSED REDUCTION METACARPAL WITH PERCUTANEOUS PINNING Right 07/05/2023   Procedure: MANIPULATION UNDER ANESTHESIA OF RIGHT INDEX AND LONG FINGERS WITH POSSIBLE CAPSULAR OF RIGHT INDEX AND LONG MCP JOINTS;  Surgeon: Christena Flake, MD;  Location: [facility];  Service: Orthopedics;  Laterality: Right;   COLONOSCOPY WITH ESOPHAGOGASTRODUODENOSCOPY (EGD)  03/03/2012   COLONOSCOPY WITH  ESOPHAGOGASTRODUODENOSCOPY (EGD)  02/23/2022   COLONOSCOPY WITH ESOPHAGOGASTRODUODENOSCOPY (EGD)  01/16/1991   COLONOSCOPY WITH PROPOFOL N/A 01/13/2018   Procedure: COLONOSCOPY WITH PROPOFOL;  Surgeon: Scot Jun, MD;  Location: [facility];  Service: Endoscopy;  Laterality: N/A;   HERNIA REPAIR  1966   I & D EXTREMITY Right 04/01/2023   Procedure: IRRIGATION AND DEBRIDEMENT EXTREMITY PREVENA PLACEMENT;  Surgeon: Christena Flake, MD;  Location: [facility];  Service: Orthopedics;  Laterality: Right;   I-131 THYROID ABLATION     INCISION AND DRAINAGE ABSCESS Right 03/30/2023   Procedure: INCISION AND DRAINAGE ABSCESS RIGHT HAND;  Surgeon: Christena Flake, MD;  Location: [facility];  Service: Orthopedics;  Laterality: Right;   INCISION AND DRAINAGE OF WOUND Right 04/04/2023   Procedure: IRRIGATION AND DEBRIDEMENT RIGHT HAND DORSAL AND PALMAR INCISIONS WITH DELAYED PRIMARY CLOSURE;  Surgeon: Christena Flake, MD;  Location: [facility];  Service: Orthopedics;  Laterality: Right;   ROTATOR CUFF REPAIR Left 2011   SHOULDER ARTHROSCOPY WITH SUBACROMIAL DECOMPRESSION, ROTATOR CUFF REPAIR AND BICEP TENDON REPAIR Right 10/21/2021   Procedure: SHOULDER ARTHROSCOPY WITH DEBRIDEMENT, DECOMPRESSION, BICEPS TENODESIS.;  Surgeon: Christena Flake, MD;  Location: [facility];  Service: Orthopedics;  Laterality: Right;   TUBAL LIGATION  1993   Patient Active Problem List   Diagnosis Date  Noted   MRSA infection 04/04/2023   Abscess of right hand 03/31/2023   Cellulitis of right hand 03/30/2023   Depression with anxiety 03/30/2023   HLD (hyperlipidemia) 03/30/2023   Type II diabetes mellitus with renal manifestations (HCC) 03/30/2023   Chronic kidney disease, stage 3a (HCC) 03/30/2023   Abnormal LFTs 03/30/2023   Severe sepsis (HCC) 03/30/2023   Hypothyroidism    Hypertension    COPD (chronic obstructive pulmonary disease) (HCC)     ONSET DATE: 03/30/23  REFERRING DIAG:Abscess of R hand -3 irrigation  surgeries/Capsulectomy 2nd and 3rd MC - release of collateral ligaments  THERAPY DIAG:  Scar condition and fibrosis of skin  Localized edema  Muscle weakness (generalized)  Stiffness of right hand, not elsewhere classified  Stiffness of right wrist, not elsewhere classified  Rationale for Evaluation and Treatment: Rehabilitation  SUBJECTIVE:   SUBJECTIVE STATEMENT: Pt reports she had to miss her last appt due to work commitments. Pt reports she followed up with Dr. Joice Lofts last week and he wants her to continue with OT and will refer her to another MD for consultation to see if there is anything ideas to help improve and address the tightness on the top of the hand.   Pt reports she is open to ideas but does not want another surgical intervention.   No pain today, just stiffness in the index finger.  She reports a person was slinging their bag over their shoulder and hit her hand this weekend and it has been more swollen.    pt accompanied by: self  PERTINENT HISTORY:  Ortho note 04/20/23 Wanda Smith is a 62 y.o. female who presents today for a skin check status post multiple irrigation and debridements involving right dorsal and volar hand wounds and possible suture removal. The patient was admitted to the hospital after suffering a dog scratch which eventually became more swollen and painful. The patient underwent a CT scan of the right hand which demonstrated a abscess formation along the volar aspect of the hand initially. She underwent 1 irrigation debridement however the patient continued to have swelling and pain prompting a repeat CT scan which demonstrated abscess formation along the dorsal aspect of the hand. The patient underwent a second irrigation debridement and a final third procedure performed by Dr. Joice Lofts on 04/04/2023. The patient was evaluated last week where skin inspection demonstrated excellent granulation without any significant erythema or purulent drainage. The  patient then had a follow-up with infectious disease where repeat CRP came back at a normal level. The patient does still have a oral prescription for linezolid which she is scheduled to take until end this week. She denies any trauma or injury affecting the right hand since her last evaluation. She does report an occasional burning discomfort in the index finger. She is still taking occasional oxycodone. She reports a 0 out of 10 pain score at today's visit. Stitches remove- and to finish antibiotics and refer to OT. ON 07/05/2023 - Pt had by Dr Joice Lofts   Attempted closed manipulation under anesthesia,  then had open capsulectomies with release of radial and ulnar collateral ligaments of both index and long finger MCP joints.- refer to OT to start ROM , splinting and tx 3 days later    PRECAUTIONS: None     WEIGHT BEARING RESTRICTIONS: No  PAIN:  Are you having pain?  More stiffness than pain  FALLS: Has patient fallen in last 6 months? No  LIVING ENVIRONMENT: Lives with: lives with their  family and lives with their spouse   PLOF: Work on [employer], gym 3 x wk , do own cooking and housework  PATIENT GOALS: Get my motion and strength back in the right hand so that I can carry and grip and hold objects  NEXT MD VISIT: middle Dec OBJECTIVE:   HAND DOMINANCE: Right   UPPER EXTREMITY ROM:     Active ROM Right eval Left eval R  04/29/23 05/03/23 05/16/23 R 9/16/24R 06/02/23 R R 07/08/23 R 07/18/23 R 08/02/23 R 08/09/2023  Shoulder flexion             Shoulder abduction             Shoulder adduction             Shoulder extension             Shoulder internal rotation             Shoulder external rotation             Elbow flexion             Elbow extension             Wrist flexion 35 110 50 50 63 72 80 60 76    Wrist extension 48 85 60 55 65 70 70 50 67 78  78  Wrist ulnar deviation 24      35 30     Wrist radial deviation 20      25 20      Wrist pronation             Wrist  supination             (Blank rows = not tested)  Active ROM Right eval Left eval R   04/29/23 R  05/12/23 R  05/16/23 R 05/23/23 R 06/02/23 R 06/09/23 R 07/08/23 R 07/13/23  In session R 07/22/23 R 08/02/23 R 08/09/2023  Thumb MCP (0-60) 70              Thumb IP (0-80) 35              Thumb Radial abd/add (0-55) 45    50  50 50        Thumb Palmar abd/add (0-45) 45    50  65 55        Thumb Opposition to Small Finger Opposition to 3rd, 4th and 5th     Opposition to all digits   Opposition to base of 5th         Index MCP (0-90) 30   50 60 60( 65 66 70 70 75 75 75 75  Index PIP (0-100) 45( ext -30   55 ( ext -20 60 ( ext -10) 60 ( blocked 70) 60 66 70 50 70 70 70 70  Index DIP (0-70)       50          Long MCP (0-90)  45   50 60 65 70 70 75 ( 80 in session)  Ext -50 flexion65 80 75 80   Long PIP (0-100)  90( ext -20   95 ( ext -15 95 ( ext -5)  100 100 100 90 80 95 100   Long DIP (0-70)                 Ring MCP (0-90)  45   60 70 75 75 80 80( 90 in session Ext -45 flexion 70 90 90 85  Ring PIP (0-100)  100 (ext -10   100( ext 0  100 - full ext  100 100 100 100 95 95 100   Ring DIP (0-70)                 Little MCP (0-90)  75   75 80  80 85 85( 90 in session) 65 90 85 85   Little PIP (0-100)  90 (ext 0   100 ( ext 0 100- full ext   100 100 100 10 95 100 100   Little DIP (0-70)                 (Blank rows = not tested) I  05/23/23 GRIP R 14 lbs L 55 lbs, lat pinch R 4 lbs L 14 lbs and 3 point pinch R 5 lbs and L 10 lbs   05/30/23: Lat grip R 7 lbs , 14 L - 3 point R 6lbs , L 10 lbs   06/07/23 GRIP R 15 lbs L 55 lbs, lat pinch R 7 lbs L 14 lbs and 3 point pinch R 8 lbs and L 10 lbs 06/21/23 GRIP R 12 lbs L 48 lbs, lat pinch R 5 lbs L 14 lbs and 3 point pinch R 6 lbs and L 10 lbs 06/25/23 GRIP R 15 lbs L 48 lbs, lat pinch R 6lbs L 14 lbs and 3 point pinch R 8 lbs and L 10 lbs 08/02/23 GRIP R 19 lbs  08/16/23 Grip R 25 lbs, 48 lbs, L, Lat pinch R 7 lbs and L 14 lbs    COORDINATION: Decreasing significantly because of stiffness and scar tissue in the second digit  EDEMA: R hand   COGNITION: Overall cognitive status: Within functional limits for tasks assessed    TODAY'S TREATMENT:                                                                                                                              DATE:  08/22/23  Fluidotherapy: Fluidotherapy to hand and wrist to decrease pain, increase tissue mobility and increase ROM. Patient performing active range of motion for digits and hand while in fluidotherapy for 12 mins.    Manual Therapy:   Following fluidotherapy, pt seen for manual techniques for carpal and metacarpal spreads, scar massage to decrease adhesions, increase tissue mobility and promote greater ROM.     Therapeutic Exercises: Therapist providing PROM to digits with prolonged stretching working towards composite fisting, pt with tightness in all digits but more in index than other digits.   Pt to cont with Knuckle Bender splint with 3 bands on the radial side and ulnar side 5 bands.  She attempted to add a 6th band which felt too aggressive, discussed when adding bands to decrease overall time in knuckle bender and work her way up to 15 mins.   During provide MC flexion stretch. Patient to wear 5 minutes after which she works  on composite flexion placed on hold to bar and palm After removing it at the max can wear 15 minutes Work on composite placing hold flexion of digits green putty for lat pinch  and 3 point pinch - 2x day  12-15 reps- pain free  precision pinch into green putty retrieving 1 cm small objects 2-3 x day   3 pound weight for supination pronation support on thigh 12 reps. 4 pound weight for scapular squeezes, shoulder extension, overhead punches and elbow curls as well as triceps 2 sets of 12 1 time a day.   PATIENT EDUCATION: Education details: Findings of evaluation and home program Person educated:  Patient Education method: Explanation, Demonstration, Tactile cues, Verbal cues, and Handouts Education comprehension: verbalized understanding, returned demonstration, verbal cues required, tactile cues required, and needs further education   GOALS: Goals reviewed with patient? Yes  SHORT TERM GOALS: Target date: 4 wks  Patient to be independent in home program to decrease scar tissue and edema - increased extension and flexion of digits as well as wrist Baseline: Severe stiffness in digits, i 3 days ago surgery stitches in place  this date- Goal status: MET   LONG TERM GOALS: Target date: 8 wks Flexion of right digits improved for patient to touch palm to initiate use in ADLs with no increase symptoms Baseline: Surgery done 3 days ago -capsulectomy -MC flexion 65 to 70 degrees coming in.  PIP 52 degrees.   NOW 3rd thru 5th touching palm Goal status: progressing    2.  Patient increase extension of right digits 2nd thru 4th to be able to don gloves, wash face and reach aroundcylinder objects without issues Baseline: 3 days ago patient had capsulectomy.  Extension of metacarpals -50-second and -40 degrees at third NOW improved greatly -still not able to lay hand flat or do wall pushup Goal status: MET   3.  Scar tissue improved for patient to be able to make composite fist as well as increase extension to don gloves and put hand in pocket Baseline: 3 days ago had surgery.  Stitches in place.  Decreased flexion and extension with increased edema  goal status:progressing    4.  Right grip and prehension strength increased to more than 75% compared to the left for patient to hold weight to work out and cook Baseline: 3 days postop from capsulectomy with ligament release on second and third metacarpal no strength  tested Goal status: progressing   5.  R wrist AROM increase to WNL to push and pull door open without increase symptoms  Baseline:  wrist flxion 60, ext 50 Goal status:  progressing   ASSESSMENT:  CLINICAL IMPRESSION: Patient presents at occupational therapy evaluation 3 days postop capsulectomy of right second and third metacarpal with release of collateral ligaments.  Patient arrived with soft cast in place.  Patient had earlier this year multiple irrigation and debridements involving right dorsal and volar hand wounds -with abscess and infection.  Patient was seen by therapy in September.  But limited by scar tissue and joint capsule tightness.  Patient continues to progress well and 3rd-5th digits for composite fist.  Only limiting in last 5 to 10 degrees of MC flexion.  Second digit improving during session able to maintain progress better.  Able to upgrade patient's putty resistance.  Grip increased to 25 pounds. Patient limited in functional use of right dominant hand.  Continued progress in all areas, edema decreased with use of compression glove, ROM improving but still has  tightness in digits especially with colder weather this week.  Reminded pt to monitor her repetitions of resistive putty to avoid increased pain in the hand.  Patient can discharge tonight wearing of MC block splint.  Patient can benefit from skilled OT services to decrease edema, scar tissue and increase range of motion in flexion and extension of digits as well as wrist, increase strength to return to prior level of function. PERFORMANCE DEFICITS: in functional skills including ADLs, IADLs, coordination, dexterity, edema, ROM, strength, pain, flexibility, and UE functional use,   and psychosocial skills including habits and routines and behaviors.   IMPAIRMENTS: are limiting patient from ADLs, IADLs, rest and sleep, work, play, leisure, and social participation.   COMORBIDITIES: has no other co-morbidities that affects occupational performance. Patient will benefit from skilled OT to address above impairments and improve overall function.  MODIFICATION OR ASSISTANCE TO COMPLETE EVALUATION:  No modification of tasks or assist necessary to complete an evaluation.  OT OCCUPATIONAL PROFILE AND HISTORY: Problem focused assessment: Including review of records relating to presenting problem.  CLINICAL DECISION MAKING: LOW - limited treatment options, no task modification necessary  REHAB POTENTIAL: Good  EVALUATION COMPLEXITY: Low  PLAN:  OT FREQUENCY: 3 x wk initially - decrease depending on progress  OT DURATION: 8 weeks  PLANNED INTERVENTIONS: ADL's therapeutic exercise, therapeutic activity, manual therapy, scar mobilization, passive range of motion, splinting, paraffin, fluidotherapy, contrast bath, patient/family education, and DME and/or AE instructions  CONSULTED AND AGREED WITH PLAN OF CARE: Patient   Derrek Gu, OTR/L,CLT 08/24/2023, 5:36 PM    OUTPATIENT OCCUPATIONAL THERAPY ORTHO  TREATMENT  Patient Name: Wanda Smith MRN: [id number] DOB:[birth year], 62 y.o., female   PCP: Merlinda Frederick NP REFERRING PROVIDER: Marney Doctor PA  END OF SESSION:  OT End of Session - 08/24/23 1735     Visit Number 29    Number of Visits 38    Date for OT Re-Evaluation 09/26/23    OT Start Time 0816    OT Stop Time 0901    OT Time Calculation (min) 45 min    Activity Tolerance Patient tolerated treatment well    Behavior During Therapy [facility] for tasks assessed/performed             Past Medical History:  Diagnosis Date   Anemia    Anxiety    Arthritis    knee (R)   Asthma    Carpal tunnel syndrome    Cellulitis of right hand 03/30/2023   Chronic kidney disease, stage 3a (HCC)    Dr Cherylann Ratel   COPD (chronic obstructive pulmonary disease) (HCC)    Depression    Diabetes mellitus without complication (HCC)    Dyspnea    GERD (gastroesophageal reflux disease)    occ   Hyperlipidemia    Hypertension    Hypothyroidism    Increased serum lipids    MRSA infection 03/30/2023   right hand   Osteoporosis    Severe sepsis (HCC) 03/30/2023   Type 2 diabetes  mellitus with renal manifestations (HCC)    Wears contact lenses    Past Surgical History:  Procedure Laterality Date   ABDOMINAL HYSTERECTOMY  1998   CARPAL TUNNEL RELEASE Right 07/26/2018   Procedure: CARPAL TUNNEL RELEASE ENDOSCOPIC;  Surgeon: Christena Flake, MD;  Location: [facility];  Service: Orthopedics;  Laterality: Right;  diabetic - oral meds   CHOLECYSTECTOMY  2013   CLOSED REDUCTION METACARPAL WITH PERCUTANEOUS PINNING Right 07/05/2023   Procedure: MANIPULATION UNDER ANESTHESIA OF RIGHT  INDEX AND LONG FINGERS WITH POSSIBLE CAPSULAR OF RIGHT INDEX AND LONG MCP JOINTS;  Surgeon: Christena Flake, MD;  Location: [facility];  Service: Orthopedics;  Laterality: Right;   COLONOSCOPY WITH ESOPHAGOGASTRODUODENOSCOPY (EGD)  03/03/2012   COLONOSCOPY WITH ESOPHAGOGASTRODUODENOSCOPY (EGD)  02/23/2022   COLONOSCOPY WITH ESOPHAGOGASTRODUODENOSCOPY (EGD)  01/16/1991   COLONOSCOPY WITH PROPOFOL N/A 01/13/2018   Procedure: COLONOSCOPY WITH PROPOFOL;  Surgeon: Scot Jun, MD;  Location: [facility];  Service: Endoscopy;  Laterality: N/A;   HERNIA REPAIR  1966   I & D EXTREMITY Right 04/01/2023   Procedure: IRRIGATION AND DEBRIDEMENT EXTREMITY PREVENA PLACEMENT;  Surgeon: Christena Flake, MD;  Location: [facility];  Service: Orthopedics;  Laterality: Right;   I-131 THYROID ABLATION     INCISION AND DRAINAGE ABSCESS Right 03/30/2023   Procedure: INCISION AND DRAINAGE ABSCESS RIGHT HAND;  Surgeon: Christena Flake, MD;  Location: [facility];  Service: Orthopedics;  Laterality: Right;   INCISION AND DRAINAGE OF WOUND Right 04/04/2023   Procedure: IRRIGATION AND DEBRIDEMENT RIGHT HAND DORSAL AND PALMAR INCISIONS WITH DELAYED PRIMARY CLOSURE;  Surgeon: Christena Flake, MD;  Location: [facility];  Service: Orthopedics;  Laterality: Right;   ROTATOR CUFF REPAIR Left 2011   SHOULDER ARTHROSCOPY WITH SUBACROMIAL DECOMPRESSION, ROTATOR CUFF REPAIR AND BICEP TENDON REPAIR Right 10/21/2021   Procedure:  SHOULDER ARTHROSCOPY WITH DEBRIDEMENT, DECOMPRESSION, BICEPS TENODESIS.;  Surgeon: Christena Flake, MD;  Location: [facility];  Service: Orthopedics;  Laterality: Right;   TUBAL LIGATION  1993   Patient Active Problem List   Diagnosis Date Noted   MRSA infection 04/04/2023   Abscess of right hand 03/31/2023   Cellulitis of right hand 03/30/2023   Depression with anxiety 03/30/2023   HLD (hyperlipidemia) 03/30/2023   Type II diabetes mellitus with renal manifestations (HCC) 03/30/2023   Chronic kidney disease, stage 3a (HCC) 03/30/2023   Abnormal LFTs 03/30/2023   Severe sepsis (HCC) 03/30/2023   Hypothyroidism    Hypertension    COPD (chronic obstructive pulmonary disease) (HCC)     ONSET DATE: 03/30/23  REFERRING DIAG:Abscess of R hand -3 irrigation surgeries/Capsulectomy 2nd and 3rd MC - release of collateral ligaments  THERAPY DIAG:  Scar condition and fibrosis of skin  Localized edema  Muscle weakness (generalized)  Stiffness of right hand, not elsewhere classified  Stiffness of right wrist, not elsewhere classified  Rationale for Evaluation and Treatment: Rehabilitation  SUBJECTIVE:   SUBJECTIVE STATEMENT: Pt reports she is doing well, just has some increased aching and stiffness in the hand with the colder weather.   pt accompanied by: self  PERTINENT HISTORY:  Ortho note 04/20/23 Wanda Smith is a 62 y.o. female who presents today for a skin check status post multiple irrigation and debridements involving right dorsal and volar hand wounds and possible suture removal. The patient was admitted to the hospital after suffering a dog scratch which eventually became more swollen and painful. The patient underwent a CT scan of the right hand which demonstrated a abscess formation along the volar aspect of the hand initially. She underwent 1 irrigation debridement however the patient continued to have swelling and pain prompting a repeat CT scan which demonstrated abscess  formation along the dorsal aspect of the hand. The patient underwent a second irrigation debridement and a final third procedure performed by Dr. Joice Lofts on 04/04/2023. The patient was evaluated last week where skin inspection demonstrated excellent granulation without any significant erythema or purulent drainage. The patient then had a follow-up with  infectious disease where repeat CRP came back at a normal level. The patient does still have a oral prescription for linezolid which she is scheduled to take until end this week. She denies any trauma or injury affecting the right hand since her last evaluation. She does report an occasional burning discomfort in the index finger. She is still taking occasional oxycodone. She reports a 0 out of 10 pain score at today's visit. Stitches remove- and to finish antibiotics and refer to OT. ON 07/05/2023 - Pt had by Dr Joice Lofts   Attempted closed manipulation under anesthesia,  then had open capsulectomies with release of radial and ulnar collateral ligaments of both index and long finger MCP joints.- refer to OT to start ROM , splinting and tx 3 days later    PRECAUTIONS: None     WEIGHT BEARING RESTRICTIONS: No  PAIN:  Are you having pain? 3-4/10 after the stretches   FALLS: Has patient fallen in last 6 months? No  LIVING ENVIRONMENT: Lives with: lives with their family and lives with their spouse   PLOF: Work on [employer], gym 3 x wk , do own cooking and housework  PATIENT GOALS: Get my motion and strength back in the right hand so that I can carry and grip and hold objects  NEXT MD VISIT: middle Dec OBJECTIVE:   HAND DOMINANCE: Right   UPPER EXTREMITY ROM:     Active ROM Right eval Left eval R  04/29/23 05/03/23 05/16/23 R 9/16/24R 06/02/23 R R 07/08/23 R 07/18/23 R 08/02/23 R 08/09/2023  Shoulder flexion             Shoulder abduction             Shoulder adduction             Shoulder extension             Shoulder internal rotation              Shoulder external rotation             Elbow flexion             Elbow extension             Wrist flexion 35 110 50 50 63 72 80 60 76    Wrist extension 48 85 60 55 65 70 70 50 67 78  78  Wrist ulnar deviation 24      35 30     Wrist radial deviation 20      25 20      Wrist pronation             Wrist supination             (Blank rows = not tested)  Active ROM Right eval Left eval R   04/29/23 R  05/12/23 R  05/16/23 R 05/23/23 R 06/02/23 R 06/09/23 R 07/08/23 R 07/13/23  In session R 07/22/23 R 08/02/23 R 08/09/2023  Thumb MCP (0-60) 70              Thumb IP (0-80) 35              Thumb Radial abd/add (0-55) 45    50  50 50        Thumb Palmar abd/add (0-45) 45    50  65 55        Thumb Opposition to Small Finger Opposition to 3rd, 4th and 5th     Opposition to all digits  Opposition to base of 5th         Index MCP (0-90) 30   50 60 60( 65 66 70 70 75 75 75 75  Index PIP (0-100) 45( ext -30   55 ( ext -20 60 ( ext -10) 60 ( blocked 70) 60 66 70 50 70 70 70 70  Index DIP (0-70)       50          Long MCP (0-90)  45   50 60 65 70 70 75 ( 80 in session)  Ext -50 flexion65 80 75 80   Long PIP (0-100)  90( ext -20   95 ( ext -15 95 ( ext -5)  100 100 100 90 80 95 100   Long DIP (0-70)                 Ring MCP (0-90)  45   60 70 75 75 80 80( 90 in session Ext -45 flexion 70 90 90 85   Ring PIP (0-100)  100 (ext -10   100( ext 0  100 - full ext  100 100 100 100 95 95 100   Ring DIP (0-70)                 Little MCP (0-90)  75   75 80  80 85 85( 90 in session) 65 90 85 85   Little PIP (0-100)  90 (ext 0   100 ( ext 0 100- full ext   100 100 100 10 95 100 100   Little DIP (0-70)                 (Blank rows = not tested) I  05/23/23 GRIP R 14 lbs L 55 lbs, lat pinch R 4 lbs L 14 lbs and 3 point pinch R 5 lbs and L 10 lbs   05/30/23: Lat grip R 7 lbs , 14 L - 3 point R 6lbs , L 10 lbs   06/07/23 GRIP R 15 lbs L 55 lbs, lat pinch R 7 lbs L 14 lbs and 3 point pinch R 8 lbs and L  10 lbs 06/21/23 GRIP R 12 lbs L 48 lbs, lat pinch R 5 lbs L 14 lbs and 3 point pinch R 6 lbs and L 10 lbs 06/25/23 GRIP R 15 lbs L 48 lbs, lat pinch R 6lbs L 14 lbs and 3 point pinch R 8 lbs and L 10 lbs 08/02/23 GRIP R 19 lbs   COORDINATION: Decreasing significantly because of stiffness and scar tissue in the second digit  EDEMA: R hand   COGNITION: Overall cognitive status: Within functional limits for tasks assessed    TODAY'S TREATMENT:                                                                                                                              DATE:  08/11/23  Paraffin to right  hand and wrist for 8 mins to decrease pain, stiffness and increase ROM.  Pt stop wearing daytime MC  block splint last Friday -cont night ime MC block splint to increase MC flexion in splint to 80-90 flexion -at nighttime.   Manual Therapy:   Pt seen for scar massage, both scars to decrease adhesions and improve tissue mobilization for greater ROM issued new cica scar pad to use night time   Decreased edema noted, continues to utilize and wear isotoner glove for edema management and control  2 layers Moleskin inside dorsal hand-based custom MC block splint for cushioning over dorsal hand   2  times a day contrast followed by abduction and adduction of digits-- doing well Metacarpal flexion with IP extension - to about 90  degrees in session - PROM 90 PROM to PIP/DIP flexion- - hold 15 sec x 10 With PROM - keeping MC at 90 - initially tight but increased in session in composite flexion Composite fist it palm for 3rd thru 5th -2 palm and a placed on hold.  Focusing on maintaining MC 90 Focus on place and hold into palm for 3rd thru 5th   Traction to wrist gentle with PROM wrist extension and composite flexion  8 x 30 sec  Cont at home with table slides 20 reps for patient she was unable to do wall push-up. Prior to AROM and AAROM   Squeezing into putty light blue easy- place and hold  composite for 3rd thru 5th into putty  10-12 reps keeping pain under 2/10  teal med putty for lat pinch  and 3 point pinch - 2x day  12-15 reps- pain free  precision pinch into teal putty retrieving 1 cm small objects- - 2-3 x day - 8-10 objects   cont 3 pound weight for supination pronation support on thigh 12 reps. Followed by 3 pound weight for scapular squeezes, shoulder extension, overhead punches and elbow curls as well as triceps 2 sets of 12 1 time a day.   PATIENT EDUCATION: Education details: Findings of evaluation and home program Person educated: Patient Education method: Explanation, Demonstration, Tactile cues, Verbal cues, and Handouts Education comprehension: verbalized understanding, returned demonstration, verbal cues required, tactile cues required, and needs further education   GOALS: Goals reviewed with patient? Yes  SHORT TERM GOALS: Target date: 4 wks  Patient to be independent in home program to decrease scar tissue and edema - increased extension and flexion of digits as well as wrist Baseline: Severe stiffness in digits, i 3 days ago surgery stitches in place  this date- Goal status: MET   LONG TERM GOALS: Target date: 8 wks Flexion of right digits improved for patient to touch palm to initiate use in ADLs with no increase symptoms Baseline: Surgery done 3 days ago -capsulectomy -MC flexion 65 to 70 degrees coming in.  PIP 52 degrees.   NOW 3rd thru 5th touching palm Goal status: progressing    2.  Patient increase extension of right digits 2nd thru 4th to be able to don gloves, wash face and reach aroundcylinder objects without issues Baseline: 3 days ago patient had capsulectomy.  Extension of metacarpals -50-second and -40 degrees at third NOW improved greatly -still not able to lay hand flat or do wall pushup Goal status: MET   3.  Scar tissue improved for patient to be able to make composite fist as well as increase extension to don gloves and put  hand in pocket Baseline: 3 days ago had  surgery.  Stitches in place.  Decreased flexion and extension with increased edema  goal status:progressing    4.  Right grip and prehension strength increased to more than 75% compared to the left for patient to hold weight to work out and cook Baseline: 3 days postop from capsulectomy with ligament release on second and third metacarpal no strength  tested Goal status: progressing   5.  R wrist AROM increase to WNL to push and pull door open without increase symptoms  Baseline:  wrist flxion 60, ext 50 Goal status: progressing   ASSESSMENT:  CLINICAL IMPRESSION: Patient presents at occupational therapy evaluation 3 days postop capsulectomy of right second and third metacarpal with release of collateral ligaments.  Patient arrived with soft cast in place.  Patient had earlier this year multiple irrigation and debridements involving right dorsal and volar hand wounds -with abscess and infection.  Patient was seen by therapy in September.  But limited by scar tissue and joint capsule tightness.  Pt withgreat progress in [facility] and PIP flexion 3rd thru 5th - MC80-85 and PIP 100 -  2nd MC flexion 75 to 85 and PIP 70-80 degrees - tenderness mostly at 2nd [facility] -pt was able to maintain progress without wearing daytime  hand-based MC flexion splint-MC flexion to 80-90  but still to wear night time  Show decrease edema using isotoner glove and keep pain under 2/10 with A/PROM.  Focus on PIP/DIP flexion  and then into composite adding MC - focus on place and hold for 3rd thru 5th to palm -cont light blue putty for composite gripping. Patient limited in functional use of right dominant hand.  Continued progress in all areas, edema decreased with use of compression glove, ROM improving but still has tightness in digits, improving with composite flexion 3rd thru 5th digits and index improving but still lagging in passive motion.  Pt saw MD last week and he would like for her to  continue with OT to work on improving ROM and decrease stiffness, he is also going to refer her to another MD to see if there are any other options to improve her hand function.  She will continue with her current home program and use of knucklebender.  Patient can benefit from skilled OT services to decrease edema, scar tissue and increase range of motion in flexion and extension of digits as well as wrist, increase strength to return to prior level of function. PERFORMANCE DEFICITS: in functional skills including ADLs, IADLs, coordination, dexterity, edema, ROM, strength, pain, flexibility, and UE functional use,   and psychosocial skills including habits and routines and behaviors.   IMPAIRMENTS: are limiting patient from ADLs, IADLs, rest and sleep, work, play, leisure, and social participation.   COMORBIDITIES: has no other co-morbidities that affects occupational performance. Patient will benefit from skilled OT to address above impairments and improve overall function.  MODIFICATION OR ASSISTANCE TO COMPLETE EVALUATION: No modification of tasks or assist necessary to complete an evaluation.  OT OCCUPATIONAL PROFILE AND HISTORY: Problem focused assessment: Including review of records relating to presenting problem.  CLINICAL DECISION MAKING: LOW - limited treatment options, no task modification necessary  REHAB POTENTIAL: Good  EVALUATION COMPLEXITY: Low  PLAN:  OT FREQUENCY: 3 x wk initially - decrease depending on progress  OT DURATION: 8 weeks  PLANNED INTERVENTIONS: ADL's therapeutic exercise, therapeutic activity, manual therapy, scar mobilization, passive range of motion, splinting, paraffin, fluidotherapy, contrast bath, patient/family education, and DME and/or AE instructions  CONSULTED AND AGREED WITH  PLAN OF CARE: Patient   Derrek Gu, OTR/L,CLT 08/24/2023, 5:36 PM

## 2023-08-25 ENCOUNTER — Ambulatory Visit: Payer: No Typology Code available for payment source | Admitting: Occupational Therapy

## 2023-08-25 ENCOUNTER — Encounter: Payer: Self-pay | Admitting: Occupational Therapy

## 2023-08-25 DIAGNOSIS — M25631 Stiffness of right wrist, not elsewhere classified: Secondary | ICD-10-CM

## 2023-08-25 DIAGNOSIS — M25641 Stiffness of right hand, not elsewhere classified: Secondary | ICD-10-CM

## 2023-08-25 DIAGNOSIS — L905 Scar conditions and fibrosis of skin: Secondary | ICD-10-CM

## 2023-08-25 DIAGNOSIS — M6281 Muscle weakness (generalized): Secondary | ICD-10-CM

## 2023-08-25 DIAGNOSIS — R600 Localized edema: Secondary | ICD-10-CM

## 2023-08-25 NOTE — Therapy (Unsigned)
OUTPATIENT OCCUPATIONAL THERAPY ORTHO  TREATMENT/PROGRESS UPDATE Reporting period from to 08/25/2023  Patient Name: Wanda Smith MRN: [id number] DOB:September 25, 1960, 62 y.o., female   PCP: Merlinda Frederick NP REFERRING PROVIDER: Marney Doctor PA  END OF SESSION:  OT End of Session - 08/25/23 0833     Visit Number 30    Number of Visits 38    Date for OT Re-Evaluation 09/26/23    OT Start Time 0817    Activity Tolerance Patient tolerated treatment well    Behavior During Therapy [facility] for tasks assessed/performed             Past Medical History:  Diagnosis Date   Anemia    Anxiety    Arthritis    knee (R)   Asthma    Carpal tunnel syndrome    Cellulitis of right hand 03/30/2023   Chronic kidney disease, stage 3a (HCC)    Dr Cherylann Ratel   COPD (chronic obstructive pulmonary disease) (HCC)    Depression    Diabetes mellitus without complication (HCC)    Dyspnea    GERD (gastroesophageal reflux disease)    occ   Hyperlipidemia    Hypertension    Hypothyroidism    Increased serum lipids    MRSA infection 03/30/2023   right hand   Osteoporosis    Severe sepsis (HCC) 03/30/2023   Type 2 diabetes mellitus with renal manifestations (HCC)    Wears contact lenses    Past Surgical History:  Procedure Laterality Date   ABDOMINAL HYSTERECTOMY  1998   CARPAL TUNNEL RELEASE Right 07/26/2018   Procedure: CARPAL TUNNEL RELEASE ENDOSCOPIC;  Surgeon: Christena Flake, MD;  Location: [facility];  Service: Orthopedics;  Laterality: Right;  diabetic - oral meds   CHOLECYSTECTOMY  2013   CLOSED REDUCTION METACARPAL WITH PERCUTANEOUS PINNING Right 07/05/2023   Procedure: MANIPULATION UNDER ANESTHESIA OF RIGHT INDEX AND LONG FINGERS WITH POSSIBLE CAPSULAR OF RIGHT INDEX AND LONG MCP JOINTS;  Surgeon: Christena Flake, MD;  Location: [facility];  Service: Orthopedics;  Laterality: Right;   COLONOSCOPY WITH ESOPHAGOGASTRODUODENOSCOPY (EGD)  03/03/2012   COLONOSCOPY WITH  ESOPHAGOGASTRODUODENOSCOPY (EGD)  02/23/2022   COLONOSCOPY WITH ESOPHAGOGASTRODUODENOSCOPY (EGD)  01/16/1991   COLONOSCOPY WITH PROPOFOL N/A 01/13/2018   Procedure: COLONOSCOPY WITH PROPOFOL;  Surgeon: Scot Jun, MD;  Location: [facility];  Service: Endoscopy;  Laterality: N/A;   HERNIA REPAIR  1966   I & D EXTREMITY Right 04/01/2023   Procedure: IRRIGATION AND DEBRIDEMENT EXTREMITY PREVENA PLACEMENT;  Surgeon: Christena Flake, MD;  Location: [facility];  Service: Orthopedics;  Laterality: Right;   I-131 THYROID ABLATION     INCISION AND DRAINAGE ABSCESS Right 03/30/2023   Procedure: INCISION AND DRAINAGE ABSCESS RIGHT HAND;  Surgeon: Christena Flake, MD;  Location: [facility];  Service: Orthopedics;  Laterality: Right;   INCISION AND DRAINAGE OF WOUND Right 04/04/2023   Procedure: IRRIGATION AND DEBRIDEMENT RIGHT HAND DORSAL AND PALMAR INCISIONS WITH DELAYED PRIMARY CLOSURE;  Surgeon: Christena Flake, MD;  Location: [facility];  Service: Orthopedics;  Laterality: Right;   ROTATOR CUFF REPAIR Left 2011   SHOULDER ARTHROSCOPY WITH SUBACROMIAL DECOMPRESSION, ROTATOR CUFF REPAIR AND BICEP TENDON REPAIR Right 10/21/2021   Procedure: SHOULDER ARTHROSCOPY WITH DEBRIDEMENT, DECOMPRESSION, BICEPS TENODESIS.;  Surgeon: Christena Flake, MD;  Location: [facility];  Service: Orthopedics;  Laterality: Right;   TUBAL LIGATION  1993   Patient Active Problem List   Diagnosis Date Noted   MRSA infection 04/04/2023   Abscess of  right hand 03/31/2023   Cellulitis of right hand 03/30/2023   Depression with anxiety 03/30/2023   HLD (hyperlipidemia) 03/30/2023   Type II diabetes mellitus with renal manifestations (HCC) 03/30/2023   Chronic kidney disease, stage 3a (HCC) 03/30/2023   Abnormal LFTs 03/30/2023   Severe sepsis (HCC) 03/30/2023   Hypothyroidism    Hypertension    COPD (chronic obstructive pulmonary disease) (HCC)     ONSET DATE: 03/30/23  REFERRING DIAG:Abscess of R hand -3 irrigation  surgeries/Capsulectomy 2nd and 3rd MC - release of collateral ligaments  THERAPY DIAG:  Scar condition and fibrosis of skin  Localized edema  Muscle weakness (generalized)  Stiffness of right hand, not elsewhere classified  Stiffness of right wrist, not elsewhere classified  Rationale for Evaluation and Treatment: Rehabilitation  SUBJECTIVE:   SUBJECTIVE STATEMENT:    pt accompanied by: self  PERTINENT HISTORY:  Ortho note 04/20/23 Wanda Smith is a 62 y.o. female who presents today for a skin check status post multiple irrigation and debridements involving right dorsal and volar hand wounds and possible suture removal. The patient was admitted to the hospital after suffering a dog scratch which eventually became more swollen and painful. The patient underwent a CT scan of the right hand which demonstrated a abscess formation along the volar aspect of the hand initially. She underwent 1 irrigation debridement however the patient continued to have swelling and pain prompting a repeat CT scan which demonstrated abscess formation along the dorsal aspect of the hand. The patient underwent a second irrigation debridement and a final third procedure performed by Dr. Joice Lofts on 04/04/2023. The patient was evaluated last week where skin inspection demonstrated excellent granulation without any significant erythema or purulent drainage. The patient then had a follow-up with infectious disease where repeat CRP came back at a normal level. The patient does still have a oral prescription for linezolid which she is scheduled to take until end this week. She denies any trauma or injury affecting the right hand since her last evaluation. She does report an occasional burning discomfort in the index finger. She is still taking occasional oxycodone. She reports a 0 out of 10 pain score at today's visit. Stitches remove- and to finish antibiotics and refer to OT. ON 07/05/2023 - Pt had by Dr Joice Lofts   Attempted  closed manipulation under anesthesia,  then had open capsulectomies with release of radial and ulnar collateral ligaments of both index and long finger MCP joints.- refer to OT to start ROM , splinting and tx 3 days later    PRECAUTIONS: None     WEIGHT BEARING RESTRICTIONS: No  PAIN:  Are you having pain?  More stiffness than pain  FALLS: Has patient fallen in last 6 months? No  LIVING ENVIRONMENT: Lives with: lives with their family and lives with their spouse   PLOF: Work on [employer], gym 3 x wk , do own cooking and housework  PATIENT GOALS: Get my motion and strength back in the right hand so that I can carry and grip and hold objects  NEXT MD VISIT: middle Dec OBJECTIVE:   HAND DOMINANCE: Right   UPPER EXTREMITY ROM:     Active ROM Right eval Left eval R  04/29/23 05/03/23 05/16/23 R 9/16/24R 06/02/23 R R 07/08/23 R 07/18/23 R 08/02/23 R 08/09/2023  Shoulder flexion             Shoulder abduction             Shoulder adduction  Shoulder extension             Shoulder internal rotation             Shoulder external rotation             Elbow flexion             Elbow extension             Wrist flexion 35 110 50 50 63 72 80 60 76    Wrist extension 48 85 60 55 65 70 70 50 67 78  78  Wrist ulnar deviation 24      35 30     Wrist radial deviation 20      25 20      Wrist pronation             Wrist supination             (Blank rows = not tested)  Active ROM Right eval Left eval R   04/29/23 R  05/12/23 R  05/16/23 R 05/23/23 R 06/02/23 R 06/09/23 R 07/08/23 R 07/13/23  In session R 07/22/23 R 08/02/23 R 08/09/2023  Thumb MCP (0-60) 70              Thumb IP (0-80) 35              Thumb Radial abd/add (0-55) 45    50  50 50        Thumb Palmar abd/add (0-45) 45    50  65 55        Thumb Opposition to Small Finger Opposition to 3rd, 4th and 5th     Opposition to all digits   Opposition to base of 5th         Index MCP (0-90) 30   50 60 60( 65 66 70  70 75 75 75 75  Index PIP (0-100) 45( ext -30   55 ( ext -20 60 ( ext -10) 60 ( blocked 70) 60 66 70 50 70 70 70 70  Index DIP (0-70)       50          Long MCP (0-90)  45   50 60 65 70 70 75 ( 80 in session)  Ext -50 flexion65 80 75 80   Long PIP (0-100)  90( ext -20   95 ( ext -15 95 ( ext -5)  100 100 100 90 80 95 100   Long DIP (0-70)                 Ring MCP (0-90)  45   60 70 75 75 80 80( 90 in session Ext -45 flexion 70 90 90 85   Ring PIP (0-100)  100 (ext -10   100( ext 0  100 - full ext  100 100 100 100 95 95 100   Ring DIP (0-70)                 Little MCP (0-90)  75   75 80  80 85 85( 90 in session) 65 90 85 85   Little PIP (0-100)  90 (ext 0   100 ( ext 0 100- full ext   100 100 100 10 95 100 100   Little DIP (0-70)                 (Blank rows = not tested) I  05/23/23 GRIP R 14 lbs L 55 lbs, lat  pinch R 4 lbs L 14 lbs and 3 point pinch R 5 lbs and L 10 lbs   05/30/23: Lat grip R 7 lbs , 14 L - 3 point R 6lbs , L 10 lbs   06/07/23 GRIP R 15 lbs L 55 lbs, lat pinch R 7 lbs L 14 lbs and 3 point pinch R 8 lbs and L 10 lbs 06/21/23 GRIP R 12 lbs L 48 lbs, lat pinch R 5 lbs L 14 lbs and 3 point pinch R 6 lbs and L 10 lbs 06/25/23 GRIP R 15 lbs L 48 lbs, lat pinch R 6lbs L 14 lbs and 3 point pinch R 8 lbs and L 10 lbs 08/02/23 GRIP R 19 lbs  08/16/23 Grip R 25 lbs, 48 lbs, L, Lat pinch R 7 lbs and L 14 lbs   COORDINATION: Decreasing significantly because of stiffness and scar tissue in the second digit  EDEMA: R hand   COGNITION: Overall cognitive status: Within functional limits for tasks assessed    TODAY'S TREATMENT:                                                                                                                              DATE:  08/25/23  Fluidotherapy: Fluidotherapy to hand and wrist to decrease pain, increase tissue mobility and increase ROM. Patient performing active range of motion for digits and hand while in fluidotherapy for 12 mins.    Manual  Therapy:   Following fluidotherapy, pt seen for manual techniques for carpal and metacarpal spreads, scar massage to decrease adhesions, increase tissue mobility and promote greater ROM.     Therapeutic Exercises: Therapist providing PROM to digits with prolonged stretching working towards composite fisting, pt with tightness in all digits but more in index than other digits.   Pt to cont with Knuckle Bender splint with 3 bands on the radial side and ulnar side 5 bands.  She attempted to add a 6th band which felt too aggressive, discussed when adding bands to decrease overall time in knuckle bender and work her way up to 15 mins.   During provide MC flexion stretch. Patient to wear 5 minutes after which she works on composite flexion placed on hold to bar and palm After removing it at the max can wear 15 minutes Work on composite placing hold flexion of digits green putty for lat pinch  and 3 point pinch - 2x day  12-15 reps- pain free  precision pinch into green putty retrieving 1 cm small objects 2-3 x day   3 pound weight for supination pronation support on thigh 12 reps. 4 pound weight for scapular squeezes, shoulder extension, overhead punches and elbow curls as well as triceps 2 sets of 12 1 time a day.   PATIENT EDUCATION: Education details: Findings of evaluation and home program Person educated: Patient Education method: Explanation, Demonstration, Tactile cues, Verbal cues, and Handouts Education comprehension: verbalized understanding, returned demonstration, verbal  cues required, tactile cues required, and needs further education   GOALS: Goals reviewed with patient? Yes  SHORT TERM GOALS: Target date: 4 wks  Patient to be independent in home program to decrease scar tissue and edema - increased extension and flexion of digits as well as wrist Baseline: Severe stiffness in digits, i 3 days ago surgery stitches in place  this date- Goal status: MET   LONG TERM GOALS:  Target date: 8 wks Flexion of right digits improved for patient to touch palm to initiate use in ADLs with no increase symptoms Baseline: Surgery done 3 days ago -capsulectomy -MC flexion 65 to 70 degrees coming in.  PIP 52 degrees.   NOW 3rd thru 5th touching palm Goal status: progressing    2.  Patient increase extension of right digits 2nd thru 4th to be able to don gloves, wash face and reach aroundcylinder objects without issues Baseline: 3 days ago patient had capsulectomy.  Extension of metacarpals -50-second and -40 degrees at third NOW improved greatly -still not able to lay hand flat or do wall pushup Goal status: MET   3.  Scar tissue improved for patient to be able to make composite fist as well as increase extension to don gloves and put hand in pocket Baseline: 3 days ago had surgery.  Stitches in place.  Decreased flexion and extension with increased edema  goal status:progressing    4.  Right grip and prehension strength increased to more than 75% compared to the left for patient to hold weight to work out and cook Baseline: 3 days postop from capsulectomy with ligament release on second and third metacarpal no strength  tested Goal status: progressing   5.  R wrist AROM increase to WNL to push and pull door open without increase symptoms  Baseline:  wrist flxion 60, ext 50 Goal status: progressing   ASSESSMENT:  CLINICAL IMPRESSION: Patient presents at occupational therapy evaluation 3 days postop capsulectomy of right second and third metacarpal with release of collateral ligaments.  Patient arrived with soft cast in place.  Patient had earlier this year multiple irrigation and debridements involving right dorsal and volar hand wounds -with abscess and infection.  Patient was seen by therapy in September.  But limited by scar tissue and joint capsule tightness.  Patient continues to progress well and 3rd-5th digits for composite fist.  Only limiting in last 5 to 10 degrees  of MC flexion.  Second digit improving during session able to maintain progress better.  Able to upgrade patient's putty resistance.  Grip increased to 25 pounds. Patient limited in functional use of right dominant hand.  Continued progress in all areas, edema decreased with use of compression glove, ROM improving but still has tightness in digits especially with colder weather this week.  Reminded pt to monitor her repetitions of resistive putty to avoid increased pain in the hand.  Patient can discharge tonight wearing of MC block splint.  Patient can benefit from skilled OT services to decrease edema, scar tissue and increase range of motion in flexion and extension of digits as well as wrist, increase strength to return to prior level of function. PERFORMANCE DEFICITS: in functional skills including ADLs, IADLs, coordination, dexterity, edema, ROM, strength, pain, flexibility, and UE functional use,   and psychosocial skills including habits and routines and behaviors.   IMPAIRMENTS: are limiting patient from ADLs, IADLs, rest and sleep, work, play, leisure, and social participation.   COMORBIDITIES: has no other co-morbidities that affects  occupational performance. Patient will benefit from skilled OT to address above impairments and improve overall function.  MODIFICATION OR ASSISTANCE TO COMPLETE EVALUATION: No modification of tasks or assist necessary to complete an evaluation.  OT OCCUPATIONAL PROFILE AND HISTORY: Problem focused assessment: Including review of records relating to presenting problem.  CLINICAL DECISION MAKING: LOW - limited treatment options, no task modification necessary  REHAB POTENTIAL: Good  EVALUATION COMPLEXITY: Low  PLAN:  OT FREQUENCY: 3 x wk initially - decrease depending on progress  OT DURATION: 8 weeks  PLANNED INTERVENTIONS: ADL's therapeutic exercise, therapeutic activity, manual therapy, scar mobilization, passive range of motion, splinting, paraffin,  fluidotherapy, contrast bath, patient/family education, and DME and/or AE instructions  CONSULTED AND AGREED WITH PLAN OF CARE: Patient   Derrek Gu, OTR/L,CLT 08/25/2023, 8:33 AM    OUTPATIENT OCCUPATIONAL THERAPY ORTHO  TREATMENT  Patient Name: Wanda Smith MRN: [id number] DOB:17-Oct-1960, 62 y.o., female   PCP: Merlinda Frederick NP REFERRING PROVIDER: Marney Doctor PA  END OF SESSION:  OT End of Session - 08/25/23 0833     Visit Number 30    Number of Visits 38    Date for OT Re-Evaluation 09/26/23    OT Start Time 0817    Activity Tolerance Patient tolerated treatment well    Behavior During Therapy [facility] for tasks assessed/performed             Past Medical History:  Diagnosis Date   Anemia    Anxiety    Arthritis    knee (R)   Asthma    Carpal tunnel syndrome    Cellulitis of right hand 03/30/2023   Chronic kidney disease, stage 3a (HCC)    Dr Cherylann Ratel   COPD (chronic obstructive pulmonary disease) (HCC)    Depression    Diabetes mellitus without complication (HCC)    Dyspnea    GERD (gastroesophageal reflux disease)    occ   Hyperlipidemia    Hypertension    Hypothyroidism    Increased serum lipids    MRSA infection 03/30/2023   right hand   Osteoporosis    Severe sepsis (HCC) 03/30/2023   Type 2 diabetes mellitus with renal manifestations (HCC)    Wears contact lenses    Past Surgical History:  Procedure Laterality Date   ABDOMINAL HYSTERECTOMY  1998   CARPAL TUNNEL RELEASE Right 07/26/2018   Procedure: CARPAL TUNNEL RELEASE ENDOSCOPIC;  Surgeon: Christena Flake, MD;  Location: [facility];  Service: Orthopedics;  Laterality: Right;  diabetic - oral meds   CHOLECYSTECTOMY  2013   CLOSED REDUCTION METACARPAL WITH PERCUTANEOUS PINNING Right 07/05/2023   Procedure: MANIPULATION UNDER ANESTHESIA OF RIGHT INDEX AND LONG FINGERS WITH POSSIBLE CAPSULAR OF RIGHT INDEX AND LONG MCP JOINTS;  Surgeon: Christena Flake, MD;  Location: [facility];  Service:  Orthopedics;  Laterality: Right;   COLONOSCOPY WITH ESOPHAGOGASTRODUODENOSCOPY (EGD)  03/03/2012   COLONOSCOPY WITH ESOPHAGOGASTRODUODENOSCOPY (EGD)  02/23/2022   COLONOSCOPY WITH ESOPHAGOGASTRODUODENOSCOPY (EGD)  01/16/1991   COLONOSCOPY WITH PROPOFOL N/A 01/13/2018   Procedure: COLONOSCOPY WITH PROPOFOL;  Surgeon: Scot Jun, MD;  Location: [facility] END[facility]COPY;  Service: Endoscopy;  Laterality: N/A;   HERNIA REPAIR  1966   I & D EXTREMITY Right 04/01/2023   Procedure: IRRIGATION AND DEBRIDEMENT EXTREMITY PREVENA PLACEMENT;  Surgeon: Christena Flake, MD;  Location: [facility];  Service: Orthopedics;  Laterality: Right;   I-131 THYROID ABLATION     INCISION AND DRAINAGE ABSCESS Right 03/30/2023   Procedure: INCISION AND DRAINAGE ABSCESS RIGHT HAND;  Surgeon: Christena Flake, MD;  Location: [facility];  Service: Orthopedics;  Laterality: Right;   INCISION AND DRAINAGE OF WOUND Right 04/04/2023   Procedure: IRRIGATION AND DEBRIDEMENT RIGHT HAND DORSAL AND PALMAR INCISIONS WITH DELAYED PRIMARY CLOSURE;  Surgeon: Christena Flake, MD;  Location: [facility];  Service: Orthopedics;  Laterality: Right;   ROTATOR CUFF REPAIR Left 2011   SHOULDER ARTHROSCOPY WITH SUBACROMIAL DECOMPRESSION, ROTATOR CUFF REPAIR AND BICEP TENDON REPAIR Right 10/21/2021   Procedure: SHOULDER ARTHROSCOPY WITH DEBRIDEMENT, DECOMPRESSION, BICEPS TENODESIS.;  Surgeon: Christena Flake, MD;  Location: [facility];  Service: Orthopedics;  Laterality: Right;   TUBAL LIGATION  1993   Patient Active Problem List   Diagnosis Date Noted   MRSA infection 04/04/2023   Abscess of right hand 03/31/2023   Cellulitis of right hand 03/30/2023   Depression with anxiety 03/30/2023   HLD (hyperlipidemia) 03/30/2023   Type II diabetes mellitus with renal manifestations (HCC) 03/30/2023   Chronic kidney disease, stage 3a (HCC) 03/30/2023   Abnormal LFTs 03/30/2023   Severe sepsis (HCC) 03/30/2023   Hypothyroidism    Hypertension    COPD (chronic  obstructive pulmonary disease) (HCC)     ONSET DATE: 03/30/23  REFERRING DIAG:Abscess of R hand -3 irrigation surgeries/Capsulectomy 2nd and 3rd MC - release of collateral ligaments  THERAPY DIAG:  Scar condition and fibrosis of skin  Localized edema  Muscle weakness (generalized)  Stiffness of right hand, not elsewhere classified  Stiffness of right wrist, not elsewhere classified  Rationale for Evaluation and Treatment: Rehabilitation  SUBJECTIVE:   SUBJECTIVE STATEMENT: Pt reports she is doing well, just has some increased aching and stiffness in the hand with the colder weather.   pt accompanied by: self  PERTINENT HISTORY:  Ortho note 04/20/23 Wanda Smith is a 62 y.o. female who presents today for a skin check status post multiple irrigation and debridements involving right dorsal and volar hand wounds and possible suture removal. The patient was admitted to the hospital after suffering a dog scratch which eventually became more swollen and painful. The patient underwent a CT scan of the right hand which demonstrated a abscess formation along the volar aspect of the hand initially. She underwent 1 irrigation debridement however the patient continued to have swelling and pain prompting a repeat CT scan which demonstrated abscess formation along the dorsal aspect of the hand. The patient underwent a second irrigation debridement and a final third procedure performed by Dr. Joice Lofts on 04/04/2023. The patient was evaluated last week where skin inspection demonstrated excellent granulation without any significant erythema or purulent drainage. The patient then had a follow-up with infectious disease where repeat CRP came back at a normal level. The patient does still have a oral prescription for linezolid which she is scheduled to take until end this week. She denies any trauma or injury affecting the right hand since her last evaluation. She does report an occasional burning discomfort  in the index finger. She is still taking occasional oxycodone. She reports a 0 out of 10 pain score at today's visit. Stitches remove- and to finish antibiotics and refer to OT. ON 07/05/2023 - Pt had by Dr Joice Lofts   Attempted closed manipulation under anesthesia,  then had open capsulectomies with release of radial and ulnar collateral ligaments of both index and long finger MCP joints.- refer to OT to start ROM , splinting and tx 3 days later    PRECAUTIONS: None     WEIGHT BEARING RESTRICTIONS: No  PAIN:  Are you having pain? 3-4/10 after the stretches   FALLS: Has patient fallen in last 6 months? No  LIVING ENVIRONMENT: Lives with: lives with their family and lives with their spouse   PLOF: Work on [employer], gym 3 x wk , do own cooking and housework  PATIENT GOALS: Get my motion and strength back in the right hand so that I can carry and grip and hold objects  NEXT MD VISIT: middle Dec OBJECTIVE:   HAND DOMINANCE: Right   UPPER EXTREMITY ROM:     Active ROM Right eval Left eval R  04/29/23 05/03/23 05/16/23 R 9/16/24R 06/02/23 R R 07/08/23 R 07/18/23 R 08/02/23 R 08/09/2023  Shoulder flexion             Shoulder abduction             Shoulder adduction             Shoulder extension             Shoulder internal rotation             Shoulder external rotation             Elbow flexion             Elbow extension             Wrist flexion 35 110 50 50 63 72 80 60 76    Wrist extension 48 85 60 55 65 70 70 50 67 78  78  Wrist ulnar deviation 24      35 30     Wrist radial deviation 20      25 20      Wrist pronation             Wrist supination             (Blank rows = not tested)  Active ROM Right eval Left eval R   04/29/23 R  05/12/23 R  05/16/23 R 05/23/23 R 06/02/23 R 06/09/23 R 07/08/23 R 07/13/23  In session R 07/22/23 R 08/02/23 R 08/09/2023 R 08/25/23  Thumb MCP (0-60) 70               Thumb IP (0-80) 35               Thumb Radial abd/add (0-55) 45     50  50 50         Thumb Palmar abd/add (0-45) 45    50  65 55         Thumb Opposition to Small Finger Opposition to 3rd, 4th and 5th     Opposition to all digits   Opposition to base of 5th          Index MCP (0-90) 30   50 60 60( 65 66 70 70 75 75 75 75   Index PIP (0-100) 45( ext -30   55 ( ext -20 60 ( ext -10) 60 ( blocked 70) 60 66 70 50 70 70 70 70   Index DIP (0-70)       50           Long MCP (0-90)  45   50 60 65 70 70 75 ( 80 in session)  Ext -50 flexion65 80 75 80    Long PIP (0-100)  90( ext -20   95 ( ext -15 95 ( ext -5)  100 100 100 90 80 95 100    Long  DIP (0-70)                  Ring MCP (0-90)  45   60 70 75 75 80 80( 90 in session Ext -45 flexion 70 90 90 85    Ring PIP (0-100)  100 (ext -10   100( ext 0  100 - full ext  100 100 100 100 95 95 100    Ring DIP (0-70)                  Little MCP (0-90)  75   75 80  80 85 85( 90 in session) 65 90 85 85    Little PIP (0-100)  90 (ext 0   100 ( ext 0 100- full ext   100 100 100 10 95 100 100    Little DIP (0-70)                  (Blank rows = not tested) I  05/23/23 GRIP R 14 lbs L 55 lbs, lat pinch R 4 lbs L 14 lbs and 3 point pinch R 5 lbs and L 10 lbs   05/30/23: Lat grip R 7 lbs , 14 L - 3 point R 6lbs , L 10 lbs   06/07/23 GRIP R 15 lbs L 55 lbs, lat pinch R 7 lbs L 14 lbs and 3 point pinch R 8 lbs and L 10 lbs 06/21/23 GRIP R 12 lbs L 48 lbs, lat pinch R 5 lbs L 14 lbs and 3 point pinch R 6 lbs and L 10 lbs 06/25/23 GRIP R 15 lbs L 48 lbs, lat pinch R 6lbs L 14 lbs and 3 point pinch R 8 lbs and L 10 lbs 08/02/23 GRIP R 19 lbs   COORDINATION: Decreasing significantly because of stiffness and scar tissue in the second digit  EDEMA: R hand   COGNITION: Overall cognitive status: Within functional limits for tasks assessed    TODAY'S TREATMENT:                                                                                                                              DATE:  08/11/23  Paraffin to right hand and  wrist for 8 mins to decrease pain, stiffness and increase ROM.  Pt stop wearing daytime MC  block splint last Friday -cont night ime MC block splint to increase MC flexion in splint to 80-90 flexion -at nighttime.   Manual Therapy:   Pt seen for scar massage, both scars to decrease adhesions and improve tissue mobilization for greater ROM issued new cica scar pad to use night time   Decreased edema noted, continues to utilize and wear isotoner glove for edema management and control  2 layers Moleskin inside dorsal hand-based custom MC block splint for cushioning over dorsal hand   2  times a day contrast followed by abduction and adduction of digits-- doing well Metacarpal flexion with  IP extension - to about 90  degrees in session - PROM 90 PROM to PIP/DIP flexion- - hold 15 sec x 10 With PROM - keeping MC at 90 - initially tight but increased in session in composite flexion Composite fist it palm for 3rd thru 5th -2 palm and a placed on hold.  Focusing on maintaining MC 90 Focus on place and hold into palm for 3rd thru 5th   Traction to wrist gentle with PROM wrist extension and composite flexion  8 x 30 sec  Cont at home with table slides 20 reps for patient she was unable to do wall push-up. Prior to AROM and AAROM   Squeezing into putty light blue easy- place and hold composite for 3rd thru 5th into putty  10-12 reps keeping pain under 2/10  teal med putty for lat pinch  and 3 point pinch - 2x day  12-15 reps- pain free  precision pinch into teal putty retrieving 1 cm small objects- - 2-3 x day - 8-10 objects   cont 3 pound weight for supination pronation support on thigh 12 reps. Followed by 3 pound weight for scapular squeezes, shoulder extension, overhead punches and elbow curls as well as triceps 2 sets of 12 1 time a day.   PATIENT EDUCATION: Education details: Findings of evaluation and home program Person educated: Patient Education method: Explanation, Demonstration,  Tactile cues, Verbal cues, and Handouts Education comprehension: verbalized understanding, returned demonstration, verbal cues required, tactile cues required, and needs further education   GOALS: Goals reviewed with patient? Yes  SHORT TERM GOALS: Target date: 4 wks  Patient to be independent in home program to decrease scar tissue and edema - increased extension and flexion of digits as well as wrist Baseline: Severe stiffness in digits, i 3 days ago surgery stitches in place  this date- Goal status: MET   LONG TERM GOALS: Target date: 8 wks Flexion of right digits improved for patient to touch palm to initiate use in ADLs with no increase symptoms Baseline: Surgery done 3 days ago -capsulectomy -MC flexion 65 to 70 degrees coming in.  PIP 52 degrees.   NOW 3rd thru 5th touching palm Goal status: progressing    2.  Patient increase extension of right digits 2nd thru 4th to be able to don gloves, wash face and reach aroundcylinder objects without issues Baseline: 3 days ago patient had capsulectomy.  Extension of metacarpals -50-second and -40 degrees at third NOW improved greatly -still not able to lay hand flat or do wall pushup Goal status: MET   3.  Scar tissue improved for patient to be able to make composite fist as well as increase extension to don gloves and put hand in pocket Baseline: 3 days ago had surgery.  Stitches in place.  Decreased flexion and extension with increased edema  goal status:progressing    4.  Right grip and prehension strength increased to more than 75% compared to the left for patient to hold weight to work out and cook Baseline: 3 days postop from capsulectomy with ligament release on second and third metacarpal no strength  tested Goal status: progressing   5.  R wrist AROM increase to WNL to push and pull door open without increase symptoms  Baseline:  wrist flxion 60, ext 50 Goal status: progressing   ASSESSMENT:  CLINICAL IMPRESSION: Patient  presents at occupational therapy evaluation 3 days postop capsulectomy of right second and third metacarpal with release of collateral ligaments.  Patient arrived  with soft cast in place.  Patient had earlier this year multiple irrigation and debridements involving right dorsal and volar hand wounds -with abscess and infection.  Patient was seen by therapy in September.  But limited by scar tissue and joint capsule tightness.  Pt withgreat progress in [facility] and PIP flexion 3rd thru 5th - MC80-85 and PIP 100 -  2nd MC flexion 75 to 85 and PIP 70-80 degrees - tenderness mostly at 2nd [facility] -pt was able to maintain progress without wearing daytime  hand-based MC flexion splint-MC flexion to 80-90  but still to wear night time  Show decrease edema using isotoner glove and keep pain under 2/10 with A/PROM.  Focus on PIP/DIP flexion  and then into composite adding MC - focus on place and hold for 3rd thru 5th to palm -cont light blue putty for composite gripping. Patient limited in functional use of right dominant hand.  Continued progress in all areas, edema decreased with use of compression glove, ROM improving but still has tightness in digits, improving with composite flexion 3rd thru 5th digits and index improving but still lagging in passive motion.  Pt saw MD last week and he would like for her to continue with OT to work on improving ROM and decrease stiffness, he is also going to refer her to another MD to see if there are any other options to improve her hand function.  She will continue with her current home program and use of knucklebender.  Patient can benefit from skilled OT services to decrease edema, scar tissue and increase range of motion in flexion and extension of digits as well as wrist, increase strength to return to prior level of function. PERFORMANCE DEFICITS: in functional skills including ADLs, IADLs, coordination, dexterity, edema, ROM, strength, pain, flexibility, and UE functional use,   and  psychosocial skills including habits and routines and behaviors.   IMPAIRMENTS: are limiting patient from ADLs, IADLs, rest and sleep, work, play, leisure, and social participation.   COMORBIDITIES: has no other co-morbidities that affects occupational performance. Patient will benefit from skilled OT to address above impairments and improve overall function.  MODIFICATION OR ASSISTANCE TO COMPLETE EVALUATION: No modification of tasks or assist necessary to complete an evaluation.  OT OCCUPATIONAL PROFILE AND HISTORY: Problem focused assessment: Including review of records relating to presenting problem.  CLINICAL DECISION MAKING: LOW - limited treatment options, no task modification necessary  REHAB POTENTIAL: Good  EVALUATION COMPLEXITY: Low  PLAN:  OT FREQUENCY: 3 x wk initially - decrease depending on progress  OT DURATION: 8 weeks  PLANNED INTERVENTIONS: ADL's therapeutic exercise, therapeutic activity, manual therapy, scar mobilization, passive range of motion, splinting, paraffin, fluidotherapy, contrast bath, patient/family education, and DME and/or AE instructions  CONSULTED AND AGREED WITH PLAN OF CARE: Patient   Chasyn Cinque, OTR/L,CLT 08/25/2023, 8:33 AM

## 2023-08-29 ENCOUNTER — Encounter: Payer: No Typology Code available for payment source | Admitting: Occupational Therapy

## 2023-08-30 ENCOUNTER — Encounter: Payer: No Typology Code available for payment source | Admitting: Occupational Therapy

## 2023-08-30 ENCOUNTER — Ambulatory Visit: Payer: No Typology Code available for payment source | Admitting: Occupational Therapy

## 2023-08-30 ENCOUNTER — Encounter: Payer: Self-pay | Admitting: Occupational Therapy

## 2023-08-30 DIAGNOSIS — M25631 Stiffness of right wrist, not elsewhere classified: Secondary | ICD-10-CM

## 2023-08-30 DIAGNOSIS — M6281 Muscle weakness (generalized): Secondary | ICD-10-CM

## 2023-08-30 DIAGNOSIS — R600 Localized edema: Secondary | ICD-10-CM

## 2023-08-30 DIAGNOSIS — M25641 Stiffness of right hand, not elsewhere classified: Secondary | ICD-10-CM

## 2023-08-30 DIAGNOSIS — L905 Scar conditions and fibrosis of skin: Secondary | ICD-10-CM

## 2023-08-30 NOTE — Therapy (Signed)
OUTPATIENT OCCUPATIONAL THERAPY ORTHO TREATMENT Patient Name: Wanda Smith MRN: [id number] DOB:[birth year], 62 y.o., female  PCP: Wanda Frederick NP REFERRING PROVIDER: Marney Doctor PA  END OF SESSION:  OT End of Session - 08/30/23 0825     Visit Number 31    Number of Visits 38    Date for OT Re-Evaluation 09/26/23    OT Start Time 0816    Activity Tolerance Patient tolerated treatment well    Behavior During Therapy Wanda Smith for tasks assessed/performed              Past Medical History:  Diagnosis Date   Anemia    Anxiety    Arthritis    knee (R)   Asthma    Carpal tunnel syndrome    Cellulitis of right hand 03/30/2023   Chronic kidney disease, stage 3a (HCC)    Dr Wanda Smith   COPD (chronic obstructive pulmonary disease) (HCC)    Depression    Diabetes mellitus without complication (HCC)    Dyspnea    GERD (gastroesophageal reflux disease)    occ   Hyperlipidemia    Hypertension    Hypothyroidism    Increased serum lipids    MRSA infection 03/30/2023   right hand   Osteoporosis    Severe sepsis (HCC) 03/30/2023   Type 2 diabetes mellitus with renal manifestations (HCC)    Wears contact lenses    Past Surgical History:  Procedure Laterality Date   ABDOMINAL HYSTERECTOMY  1998   CARPAL TUNNEL RELEASE Right 07/26/2018   Procedure: CARPAL TUNNEL RELEASE ENDOSCOPIC;  Surgeon: Wanda Flake, MD;  Location: [facility];  Service: Orthopedics;  Laterality: Right;  diabetic - oral meds   CHOLECYSTECTOMY  2013   CLOSED REDUCTION METACARPAL WITH PERCUTANEOUS PINNING Right 07/05/2023   Procedure: MANIPULATION UNDER ANESTHESIA OF RIGHT INDEX AND LONG FINGERS WITH POSSIBLE CAPSULAR OF RIGHT INDEX AND LONG MCP JOINTS;  Surgeon: Wanda Flake, MD;  Location: [facility];  Service: Orthopedics;  Laterality: Right;   COLONOSCOPY WITH ESOPHAGOGASTRODUODENOSCOPY (EGD)  03/03/2012   COLONOSCOPY WITH ESOPHAGOGASTRODUODENOSCOPY (EGD)  02/23/2022   COLONOSCOPY WITH  ESOPHAGOGASTRODUODENOSCOPY (EGD)  01/16/1991   COLONOSCOPY WITH PROPOFOL N/A 01/13/2018   Procedure: COLONOSCOPY WITH PROPOFOL;  Surgeon: Wanda Jun, MD;  Location: [facility];  Service: Endoscopy;  Laterality: N/A;   HERNIA REPAIR  1966   I & D EXTREMITY Right 04/01/2023   Procedure: IRRIGATION AND DEBRIDEMENT EXTREMITY PREVENA PLACEMENT;  Surgeon: Wanda Flake, MD;  Location: [facility];  Service: Orthopedics;  Laterality: Right;   I-131 THYROID ABLATION     INCISION AND DRAINAGE ABSCESS Right 03/30/2023   Procedure: INCISION AND DRAINAGE ABSCESS RIGHT HAND;  Surgeon: Wanda Flake, MD;  Location: [facility];  Service: Orthopedics;  Laterality: Right;   INCISION AND DRAINAGE OF WOUND Right 04/04/2023   Procedure: IRRIGATION AND DEBRIDEMENT RIGHT HAND DORSAL AND PALMAR INCISIONS WITH DELAYED PRIMARY CLOSURE;  Surgeon: Wanda Flake, MD;  Location: [facility];  Service: Orthopedics;  Laterality: Right;   ROTATOR CUFF REPAIR Left 2011   SHOULDER ARTHROSCOPY WITH SUBACROMIAL DECOMPRESSION, ROTATOR CUFF REPAIR AND BICEP TENDON REPAIR Right 10/21/2021   Procedure: SHOULDER ARTHROSCOPY WITH DEBRIDEMENT, DECOMPRESSION, BICEPS TENODESIS.;  Surgeon: Wanda Flake, MD;  Location: [facility];  Service: Orthopedics;  Laterality: Right;   TUBAL LIGATION  1993   Patient Active Problem List   Diagnosis Date Noted   MRSA infection 04/04/2023   Abscess of right hand 03/31/2023   Cellulitis of right  hand 03/30/2023   Depression with anxiety 03/30/2023   HLD (hyperlipidemia) 03/30/2023   Type II diabetes mellitus with renal manifestations (HCC) 03/30/2023   Chronic kidney disease, stage 3a (HCC) 03/30/2023   Abnormal LFTs 03/30/2023   Severe sepsis (HCC) 03/30/2023   Hypothyroidism    Hypertension    COPD (chronic obstructive pulmonary disease) (HCC)     ONSET DATE: 03/30/23  REFERRING DIAG:Abscess of R hand -3 irrigation surgeries/Capsulectomy 2nd and 3rd MC - release of collateral  ligaments  THERAPY DIAG:  Localized edema  Scar condition and fibrosis of skin  Muscle weakness (generalized)  Stiffness of right hand, not elsewhere classified  Stiffness of right wrist, not elsewhere classified  Rationale for Evaluation and Treatment: Rehabilitation  SUBJECTIVE:   SUBJECTIVE STATEMENT:  Pt reports she is doing well, still doing her exercises and using knuckle bender, she feels like she progress in minimal with movement in her index finger for flexion.  pt accompanied by: self  PERTINENT HISTORY:  Ortho note 04/20/23 Wanda Smith is a 62 y.o. female who presents today for a skin check status post multiple irrigation and debridements involving right dorsal and volar hand wounds and possible suture removal. The patient was admitted to the hospital after suffering a dog scratch which eventually became more swollen and painful. The patient underwent a CT scan of the right hand which demonstrated a abscess formation along the volar aspect of the hand initially. She underwent 1 irrigation debridement however the patient continued to have swelling and pain prompting a repeat CT scan which demonstrated abscess formation along the dorsal aspect of the hand. The patient underwent a second irrigation debridement and a final third procedure performed by Dr. Joice Smith on 04/04/2023. The patient was evaluated last week where skin inspection demonstrated excellent granulation without any significant erythema or purulent drainage. The patient then had a follow-up with infectious disease where repeat CRP came back at a normal level. The patient does still have a oral prescription for linezolid which she is scheduled to take until end this week. She denies any trauma or injury affecting the right hand since her last evaluation. She does report an occasional burning discomfort in the index finger. She is still taking occasional oxycodone. She reports a 0 out of 10 pain score at today's visit.  Stitches remove- and to finish antibiotics and refer to OT. ON 07/05/2023 - Pt had by Dr Wanda Smith   Attempted closed manipulation under anesthesia,  then had open capsulectomies with release of radial and ulnar collateral ligaments of both index and long finger MCP joints.- refer to OT to start ROM , splinting and tx 3 days later    PRECAUTIONS: None  WEIGHT BEARING RESTRICTIONS: No  PAIN:  Are you having pain?  More stiffness than pain  FALLS: Has patient fallen in last 6 months? No  LIVING ENVIRONMENT: Lives with: lives with their family and lives with their spouse  PLOF: Work on [employer], gym 3 x wk , do own cooking and housework  PATIENT GOALS: Get my motion and strength back in the right hand so that I can carry and grip and hold objects  NEXT MD VISIT: middle Dec OBJECTIVE:   HAND DOMINANCE: Right  UPPER EXTREMITY ROM:     Active ROM Right eval Left eval R  04/29/23 05/03/23 05/16/23 R 9/16/24R 06/02/23 R R 07/08/23 R 07/18/23 R 08/02/23 R 08/09/2023 R 08/25/23  Shoulder flexion  Shoulder abduction              Shoulder adduction              Shoulder extension              Shoulder internal rotation              Shoulder external rotation              Elbow flexion              Elbow extension              Wrist flexion 35 110 50 50 63 72 80 60 76   70  Wrist extension 48 85 60 55 65 70 70 50 67 78  78 60  Wrist ulnar deviation 24      35 30    30   Wrist radial deviation 20      25 20    22   Wrist pronation              Wrist supination              (Blank rows = not tested)  Active ROM Right eval Left eval R   04/29/23 R  05/12/23 R  05/16/23 R 05/23/23 R 06/02/23 R 06/09/23 R 07/08/23 R 07/13/23  In session R 07/22/23 R 08/02/23 R 08/09/2023 R 08/25/23  Thumb MCP (0-60) 70               Thumb IP (0-80) 35               Thumb Radial abd/add (0-55) 45    50  50 50         Thumb Palmar abd/add (0-45) 45    50  65 55         Thumb Opposition to  Small Finger Opposition to 3rd, 4th and 5th     Opposition to all digits   Opposition to base of 5th          Index MCP (0-90) 30   50 60 60( 65 66 70 70 75 75 75 75 75  Index PIP (0-100) 45( ext -30   55 ( ext -20 60 ( ext -10) 60 ( blocked 70) 60 66 70 50 70 70 70 70 80  Index DIP (0-70)       50           Long MCP (0-90)  45   50 60 65 70 70 75 ( 80 in session)  Ext -50 flexion65 80 75 80  80  Long PIP (0-100)  90( ext -20   95 ( ext -15 95 ( ext -5)  100 100 100 90 80 95 100  95  Long DIP (0-70)                  Ring MCP (0-90)  45   60 70 75 75 80 80( 90 in session Ext -45 flexion 70 90 90 85  85  Ring PIP (0-100)  100 (ext -10   100( ext 0  100 - full ext  100 100 100 100 95 95 100  100  Ring DIP (0-70)                  Little MCP (0-90)  75   75 80  80 85 85( 90 in session) 65 90 85 85  85  Little PIP (0-100)  90 (ext 0   100 ( ext 0 100- full ext   100 100 100 10 95 100 100  100  Little DIP (0-70)                  (Blank rows = not tested) I  05/23/23 GRIP R 14 lbs L 55 lbs, lat pinch R 4 lbs L 14 lbs and 3 point pinch R 5 lbs and L 10 lbs   05/30/23: Lat grip R 7 lbs , 14 L - 3 point R 6lbs , L 10 lbs   06/07/23 GRIP R 15 lbs L 55 lbs, lat pinch R 7 lbs L 14 lbs and 3 point pinch R 8 lbs and L 10 lbs 06/21/23 GRIP R 12 lbs L 48 lbs, lat pinch R 5 lbs L 14 lbs and 3 point pinch R 6 lbs and L 10 lbs 06/25/23 GRIP R 15 lbs L 48 lbs, lat pinch R 6lbs L 14 lbs and 3 point pinch R 8 lbs and L 10 lbs 08/02/23 GRIP R 19 lbs  08/16/23 Grip R 25 lbs, 48 lbs, L, Lat pinch R 7 lbs and L 14 lbs  08/25/23 Grip R 26 lbs, 48 lbs, L, Lat pinch R 8 lbs and L 14 lbs, 3 point R 8 lb  COORDINATION: Decreasing significantly because of stiffness and scar tissue in the second digit  EDEMA: R hand   COGNITION: Overall cognitive status: Within functional limits for tasks assessed  TODAY'S TREATMENT:                                                                                                                               DATE:  08/30/23  Fluidotherapy: Fluidotherapy to hand and wrist to decrease pain, increase tissue mobility and increase ROM. Patient performing active range of motion for digits and hand while in fluidotherapy for 13 mins.    Manual Therapy:   Following fluidotherapy, pt seen for manual techniques for carpal and metacarpal spreads, extensive scar massage to decrease adhesions, increase tissue mobility and promote greater ROM.  Focused on spreads and motion between index and middle finger  Therapeutic Exercises: Therapist providing PROM to digits with prolonged stretching working towards composite fisting, pt with tightness in all digits but more in index than other digits.   Pt to cont with Knuckle Bender splint with 3 bands on the radial side and ulnar side 5 bands.  Pt added 6th band and wear for shorter periods of time as she is able to tolerate.  Patient to wear 5 minutes after which she works on composite flexion placed on hold to bar and palm After removing it at the max can wear 15 minutes Continue to Work on composite placing hold flexion of digits green putty for lat pinch  and 3 point pinch - 2x day  12-15 reps- pain free , 2 sets precision pinch into green putty retrieving 1 cm small  objects 2-3 x day   Coban applied to hand in composite fist position with therapist adjusting angle and pressure manually with the end of the coban to provide greater stretch into flexion, performed repeated trials with varying levels of resistance.    Pt to continue with 3 pound weight for supination pronation support on thigh 12 reps. 4 pound weight for scapular squeezes, shoulder extension, overhead punches and elbow curls as well as triceps 2 sets of 12 1 time a day.   PATIENT EDUCATION: Education details: Findings of evaluation and home program Person educated: Patient Education method: Explanation, Demonstration, Tactile cues, Verbal cues, and Handouts Education  comprehension: verbalized understanding, returned demonstration, verbal cues required, tactile cues required, and needs further education   GOALS: Goals reviewed with patient? Yes  SHORT TERM GOALS: Target date: 4 wks  Patient to be independent in home program to decrease scar tissue and edema - increased extension and flexion of digits as well as wrist Baseline: Severe stiffness in digits, i 3 days ago surgery stitches in place  this date- Goal status: MET   LONG TERM GOALS: Target date: 8 wks Flexion of right digits improved for patient to touch palm to initiate use in ADLs with no increase symptoms Baseline: Surgery done 3 days ago -capsulectomy -MC flexion 65 to 70 degrees coming in.  PIP 52 degrees.   NOW 3rd thru 5th touching palm Goal status: progressing    2.  Patient increase extension of right digits 2nd thru 4th to be able to don gloves, wash face and reach aroundcylinder objects without issues Baseline: 3 days ago patient had capsulectomy.  Extension of metacarpals -50-second and -40 degrees at third NOW improved greatly -still not able to lay hand flat or do wall pushup Goal status: MET   3.  Scar tissue improved for patient to be able to make composite fist as well as increase extension to don gloves and put hand in pocket Baseline: 3 days ago had surgery.  Stitches in place.  Decreased flexion and extension with increased edema  goal status:progressing    4.  Right grip and prehension strength increased to more than 75% compared to the left for patient to hold weight to work out and cook Baseline: 3 days postop from capsulectomy with ligament release on second and third metacarpal no strength  tested Goal status: progressing   5.  R wrist AROM increase to WNL to push and pull door open without increase symptoms  Baseline:  wrist flxion 60, ext 50 Goal status: progressing   ASSESSMENT:  CLINICAL IMPRESSION: Patient presents at occupational therapy evaluation 3 days  postop capsulectomy of right second and third metacarpal with release of collateral ligaments.  Patient arrived with soft cast in place.  Patient had earlier this year multiple irrigation and debridements involving right dorsal and volar hand wounds -with abscess and infection.  Patient was seen by therapy in September.  Pt remains limited by scar tissue and joint capsule tightness.  Patient continues to progress well and 3rd-5th digits for composite fist. Second digit improving during session able to maintain progress better. Patient remains limited in functional use of right dominant hand.  ROM improving but still has tightness in digits especially with colder weather.  Pt to monitor her repetitions of resistive putty to avoid increased pain in the hand. Pt continues to make progress, slight increase in grip to 26 pounds, pinch up by 1#, she has been working to increase MP flexion with use of  knuckle bender and increasing resistance and time worn slowly. Good toleration of use of adjustable tension with coban working towards composite fist.  Pt to continue with home program on her own for the next week and return for reassessment.   Patient continues benefit from skilled OT services to decrease edema, scar tissue and increase range of motion in flexion and extension of digits as well as wrist, increase strength to return to prior level of function.  PERFORMANCE DEFICITS: in functional skills including ADLs, IADLs, coordination, dexterity, edema, ROM, strength, pain, flexibility, and UE functional use,   and psychosocial skills including habits and routines and behaviors.   IMPAIRMENTS: are limiting patient from ADLs, IADLs, rest and sleep, work, play, leisure, and social participation.   COMORBIDITIES: has no other co-morbidities that affects occupational performance. Patient will benefit from skilled OT to address above impairments and improve overall function.  MODIFICATION OR ASSISTANCE TO COMPLETE  EVALUATION: No modification of tasks or assist necessary to complete an evaluation.  OT OCCUPATIONAL PROFILE AND HISTORY: Problem focused assessment: Including review of records relating to presenting problem.  CLINICAL DECISION MAKING: LOW - limited treatment options, no task modification necessary  REHAB POTENTIAL: Good  EVALUATION COMPLEXITY: Low  PLAN:  OT FREQUENCY: 3 x wk initially - decrease depending on progress  OT DURATION: 8 weeks  PLANNED INTERVENTIONS: ADL's therapeutic exercise, therapeutic activity, manual therapy, scar mobilization, passive range of motion, splinting, paraffin, fluidotherapy, contrast bath, patient/family education, and DME and/or AE instructions  CONSULTED AND AGREED WITH PLAN OF CARE: Patient   Baeleigh Devincent, OTR/L,CLT 08/30/2023, 8:26 AM

## 2023-09-06 ENCOUNTER — Ambulatory Visit: Payer: No Typology Code available for payment source | Admitting: Occupational Therapy

## 2023-09-06 ENCOUNTER — Encounter: Payer: Self-pay | Admitting: Occupational Therapy

## 2023-09-06 DIAGNOSIS — R600 Localized edema: Secondary | ICD-10-CM

## 2023-09-06 DIAGNOSIS — L905 Scar conditions and fibrosis of skin: Secondary | ICD-10-CM

## 2023-09-06 DIAGNOSIS — M25641 Stiffness of right hand, not elsewhere classified: Secondary | ICD-10-CM

## 2023-09-06 DIAGNOSIS — M6281 Muscle weakness (generalized): Secondary | ICD-10-CM

## 2023-09-06 DIAGNOSIS — M25631 Stiffness of right wrist, not elsewhere classified: Secondary | ICD-10-CM

## 2023-09-06 NOTE — Therapy (Signed)
 OUTPATIENT OCCUPATIONAL THERAPY ORTHO TREATMENT Patient Name: Wanda Smith MRN: [id number] DOB:[birth year], 62 y.o., female  PCP: Marikay NP REFERRING PROVIDER: Kip PA  END OF SESSION:  OT End of Session - 09/06/23 0822     Visit Number 32    Number of Visits 38    Date for OT Re-Evaluation 09/26/23    OT Start Time 0815    OT Stop Time 0900    OT Time Calculation (min) 45 min    Activity Tolerance Patient tolerated treatment well    Behavior During Therapy [facility] for tasks assessed/performed              Past Medical History:  Diagnosis Date   Anemia    Anxiety    Arthritis    knee (R)   Asthma    Carpal tunnel syndrome    Cellulitis of right hand 03/30/2023   Chronic kidney disease, stage 3a (HCC)    Dr Marcelino   COPD (chronic obstructive pulmonary disease) (HCC)    Depression    Diabetes mellitus without complication (HCC)    Dyspnea    GERD (gastroesophageal reflux disease)    occ   Hyperlipidemia    Hypertension    Hypothyroidism    Increased serum lipids    MRSA infection 03/30/2023   right hand   Osteoporosis    Severe sepsis (HCC) 03/30/2023   Type 2 diabetes mellitus with renal manifestations (HCC)    Wears contact lenses    Past Surgical History:  Procedure Laterality Date   ABDOMINAL HYSTERECTOMY  1998   CARPAL TUNNEL RELEASE Right 07/26/2018   Procedure: CARPAL TUNNEL RELEASE ENDOSCOPIC;  Surgeon: Edie Norleen PARAS, MD;  Location: [facility];  Service: Orthopedics;  Laterality: Right;  diabetic - oral meds   CHOLECYSTECTOMY  2013   CLOSED REDUCTION METACARPAL WITH PERCUTANEOUS PINNING Right 07/05/2023   Procedure: MANIPULATION UNDER ANESTHESIA OF RIGHT INDEX AND LONG FINGERS WITH POSSIBLE CAPSULAR OF RIGHT INDEX AND LONG MCP JOINTS;  Surgeon: Edie Norleen PARAS, MD;  Location: [facility];  Service: Orthopedics;  Laterality: Right;   COLONOSCOPY WITH ESOPHAGOGASTRODUODENOSCOPY (EGD)  03/03/2012   COLONOSCOPY WITH  ESOPHAGOGASTRODUODENOSCOPY (EGD)  02/23/2022   COLONOSCOPY WITH ESOPHAGOGASTRODUODENOSCOPY (EGD)  01/16/1991   COLONOSCOPY WITH PROPOFOL  N/A 01/13/2018   Procedure: COLONOSCOPY WITH PROPOFOL ;  Surgeon: Viktoria Lamar DASEN, MD;  Location: [facility];  Service: Endoscopy;  Laterality: N/A;   HERNIA REPAIR  1966   I & D EXTREMITY Right 04/01/2023   Procedure: IRRIGATION AND DEBRIDEMENT EXTREMITY PREVENA PLACEMENT;  Surgeon: Edie Norleen PARAS, MD;  Location: [facility];  Service: Orthopedics;  Laterality: Right;   I-131 THYROID  ABLATION     INCISION AND DRAINAGE ABSCESS Right 03/30/2023   Procedure: INCISION AND DRAINAGE ABSCESS RIGHT HAND;  Surgeon: Edie Norleen PARAS, MD;  Location: [facility];  Service: Orthopedics;  Laterality: Right;   INCISION AND DRAINAGE OF WOUND Right 04/04/2023   Procedure: IRRIGATION AND DEBRIDEMENT RIGHT HAND DORSAL AND PALMAR INCISIONS WITH DELAYED PRIMARY CLOSURE;  Surgeon: Edie Norleen PARAS, MD;  Location: [facility];  Service: Orthopedics;  Laterality: Right;   ROTATOR CUFF REPAIR Left 2011   SHOULDER ARTHROSCOPY WITH SUBACROMIAL DECOMPRESSION, ROTATOR CUFF REPAIR AND BICEP TENDON REPAIR Right 10/21/2021   Procedure: SHOULDER ARTHROSCOPY WITH DEBRIDEMENT, DECOMPRESSION, BICEPS TENODESIS.;  Surgeon: Edie Norleen PARAS, MD;  Location: [facility];  Service: Orthopedics;  Laterality: Right;   TUBAL LIGATION  1993   Patient Active Problem List   Diagnosis Date Noted  MRSA infection 04/04/2023   Abscess of right hand 03/31/2023   Cellulitis of right hand 03/30/2023   Depression with anxiety 03/30/2023   HLD (hyperlipidemia) 03/30/2023   Type II diabetes mellitus with renal manifestations (HCC) 03/30/2023   Chronic kidney disease, stage 3a (HCC) 03/30/2023   Abnormal LFTs 03/30/2023   Severe sepsis (HCC) 03/30/2023   Hypothyroidism    Hypertension    COPD (chronic obstructive pulmonary disease) (HCC)     ONSET DATE: 03/30/23  REFERRING DIAG:Abscess of R hand -3 irrigation  surgeries/Capsulectomy 2nd and 3rd MC - release of collateral ligaments  THERAPY DIAG:  Localized edema  Muscle weakness (generalized)  Stiffness of right wrist, not elsewhere classified  Scar condition and fibrosis of skin  Stiffness of right hand, not elsewhere classified  Rationale for Evaluation and Treatment: Rehabilitation  SUBJECTIVE:   SUBJECTIVE STATEMENT:  Pt reports her right hand is starting to tingle some and just feels a little weird, I hope that is a sign that it's healing.  pt accompanied by: self  PERTINENT HISTORY:  Ortho note 04/20/23 Wanda Smith is a 62 y.o. female who presents today for a skin check status post multiple irrigation and debridements involving right dorsal and volar hand wounds and possible suture removal. The patient was admitted to the hospital after suffering a dog scratch which eventually became more swollen and painful. The patient underwent a CT scan of the right hand which demonstrated a abscess formation along the volar aspect of the hand initially. She underwent 1 irrigation debridement however the patient continued to have swelling and pain prompting a repeat CT scan which demonstrated abscess formation along the dorsal aspect of the hand. The patient underwent a second irrigation debridement and a final third procedure performed by Dr. Edie on 04/04/2023. The patient was evaluated last week where skin inspection demonstrated excellent granulation without any significant erythema or purulent drainage. The patient then had a follow-up with infectious disease where repeat CRP came back at a normal level. The patient does still have a oral prescription for linezolid  which she is scheduled to take until end this week. She denies any trauma or injury affecting the right hand since her last evaluation. She does report an occasional burning discomfort in the index finger. She is still taking occasional oxycodone . She reports a 0 out of 10 pain  score at today's visit. Stitches remove- and to finish antibiotics and refer to OT. ON 07/05/2023 - Pt had by Dr Edie   Attempted closed manipulation under anesthesia,  then had open capsulectomies with release of radial and ulnar collateral ligaments of both index and long finger MCP joints.- refer to OT to start ROM , splinting and tx 3 days later    PRECAUTIONS: None  WEIGHT BEARING RESTRICTIONS: No  PAIN:  Are you having pain?  More stiffness than pain  FALLS: Has patient fallen in last 6 months? No  LIVING ENVIRONMENT: Lives with: lives with their family and lives with their spouse  PLOF: Work on animator, gym 3 x wk , do own cooking and housework  PATIENT GOALS: Get my motion and strength back in the right hand so that I can carry and grip and hold objects  NEXT MD VISIT: middle Dec OBJECTIVE:   HAND DOMINANCE: Right  UPPER EXTREMITY ROM:     Active ROM Right eval Left eval R  04/29/23 05/03/23 05/16/23 R 9/16/24R 06/02/23 R R 07/08/23 R 07/18/23 R 08/02/23 R 08/09/2023 R 08/25/23  Shoulder flexion  Shoulder abduction              Shoulder adduction              Shoulder extension              Shoulder internal rotation              Shoulder external rotation              Elbow flexion              Elbow extension              Wrist flexion 35 110 50 50 63 72 80 60 76   70  Wrist extension 48 85 60 55 65 70 70 50 67 78  78 60  Wrist ulnar deviation 24      35 30    30   Wrist radial deviation 20      25 20    22   Wrist pronation              Wrist supination              (Blank rows = not tested)  Active ROM Right eval Left eval R   04/29/23 R  05/12/23 R  05/16/23 R 05/23/23 R 06/02/23 R 06/09/23 R 07/08/23 R 07/13/23  In session R 07/22/23 R 08/02/23 R 08/09/2023 R 08/25/23   Thumb MCP (0-60) 70                Thumb IP (0-80) 35                Thumb Radial abd/add (0-55) 45    50  50 50          Thumb Palmar abd/add (0-45) 45    50  65 55           Thumb Opposition to Small Finger Opposition to 3rd, 4th and 5th     Opposition to all digits   Opposition to base of 5th           Index MCP (0-90) 30   50 60 60( 65 66 70 70 75 75 75 75 75 80   Index PIP (0-100) 45( ext -30   55 ( ext -20 60 ( ext -10) 60 ( blocked 70) 60 66 70 50 70 70 70 70 80 80   Index DIP (0-70)       50            Long MCP (0-90)  45   50 60 65 70 70 75 ( 80 in session)  Ext -50 flexion65 80 75 80  80   Long PIP (0-100)  90( ext -20   95 ( ext -15 95 ( ext -5)  100 100 100 90 80 95 100  95   Long DIP (0-70)                   Ring MCP (0-90)  45   60 70 75 75 80 80( 90 in session Ext -45 flexion 70 90 90 85  85   Ring PIP (0-100)  100 (ext -10   100( ext 0  100 - full ext  100 100 100 100 95 95 100  100   Ring DIP (0-70)                   Little MCP (0-90)  75   75 80  80  85 85( 90 in session) 65 90 85 85  85   Little PIP (0-100)  90 (ext 0   100 ( ext 0 100- full ext   100 100 100 10 95 100 100  100   Little DIP (0-70)                   (Blank rows = not tested) I  05/23/23 GRIP R 14 lbs L 55 lbs, lat pinch R 4 lbs L 14 lbs and 3 point pinch R 5 lbs and L 10 lbs   05/30/23: Lat grip R 7 lbs , 14 L - 3 point R 6lbs , L 10 lbs   06/07/23 GRIP R 15 lbs L 55 lbs, lat pinch R 7 lbs L 14 lbs and 3 point pinch R 8 lbs and L 10 lbs 06/21/23 GRIP R 12 lbs L 48 lbs, lat pinch R 5 lbs L 14 lbs and 3 point pinch R 6 lbs and L 10 lbs 06/25/23 GRIP R 15 lbs L 48 lbs, lat pinch R 6lbs L 14 lbs and 3 point pinch R 8 lbs and L 10 lbs 08/02/23 GRIP R 19 lbs  08/16/23 Grip R 25 lbs, 48 lbs, L, Lat pinch R 7 lbs and L 14 lbs  08/25/23 Grip R 26 lbs, 48 lbs, L, Lat pinch R 8 lbs and L 14 lbs, 3 point R 8 lb  COORDINATION: Decreasing significantly because of stiffness and scar tissue in the second digit  EDEMA: R hand   COGNITION: Overall cognitive status: Within functional limits for tasks assessed  TODAY'S TREATMENT:                                                                                                                               DATE:  09/06/23  Fluidotherapy: Fluidotherapy to hand and wrist to decrease pain, increase tissue mobility and increase ROM. Patient performing active range of motion for digits and hand while in fluidotherapy for 10 mins.    Manual Therapy:   Following fluidotherapy, pt seen for manual techniques for carpal and metacarpal spreads, extensive scar massage to decrease adhesions, increase tissue mobility and promote greater ROM.  Focused on spreads and motion between index and middle finger.  Placed small brown foam between index and middle finger while using mini massager to scar dorsal and volar surfaces of the hand.  Prolonged stretching for finger ABD with increasing space passively with foam.  Decreased tightness and increased range noted at the end of session.    Therapeutic Exercises: Therapist providing PROM to digits with prolonged stretching working towards composite fisting, pt with continued tightness in all digits but more in index than other digits.   Pt to cont with Knuckle Bender splint with 3 bands on the radial side and ulnar side 6 bands. Pt has worked up to 8-10 mins of toleration at home.    Composite fisting with place and  hold flexion of digits Green putty for lat pinch  and 3 point pinch 2x day  12-15 reps- pain free , increase this week to 3 sets Precision pinch into green putty retrieving 1 cm small objects 2-3 x day   Added web cando for resistive finger flexion at DIP, PIP and MP.  Performed with fingers using close grip and then spread grip for 10 reps for 1 set in each position.    Coban applied to hand in composite fist position with therapist adjusting angle and pressure manually with the end of the coban to provide greater stretch into flexion, performed repeated trials with varying levels of resistance.  Pt reported this helped last session and she could tell she had great ROM and decreased tightness  after last session without any increased pain.  Pt to continue with 2 pound weight (she has 2# at home) for supination pronation support on thigh 12 reps, adjusted grip this time to increase lever arm to weight for supination end range since she does not have a 3# at home and 4# is too much currently. Continue with 4 pound weight for scapular squeezes, shoulder extension, overhead punches and elbow curls as well as triceps 2 sets of 12 1 time a day.   PATIENT EDUCATION: Education details: Findings of evaluation and home program Person educated: Patient Education method: Explanation, Demonstration, Tactile cues, Verbal cues, and Handouts Education comprehension: verbalized understanding, returned demonstration, verbal cues required, tactile cues required, and needs further education   GOALS: Goals reviewed with patient? Yes  SHORT TERM GOALS: Target date: 4 wks  Patient to be independent in home program to decrease scar tissue and edema - increased extension and flexion of digits as well as wrist Baseline: Severe stiffness in digits, i 3 days ago surgery stitches in place  this date- Goal status: MET   LONG TERM GOALS: Target date: 8 wks Flexion of right digits improved for patient to touch palm to initiate use in ADLs with no increase symptoms Baseline: Surgery done 3 days ago -capsulectomy -MC flexion 65 to 70 degrees coming in.  PIP 52 degrees.   NOW 3rd thru 5th touching palm Goal status: progressing    2.  Patient increase extension of right digits 2nd thru 4th to be able to don gloves, wash face and reach aroundcylinder objects without issues Baseline: 3 days ago patient had capsulectomy.  Extension of metacarpals -50-second and -40 degrees at third NOW improved greatly -still not able to lay hand flat or do wall pushup Goal status: MET   3.  Scar tissue improved for patient to be able to make composite fist as well as increase extension to don gloves and put hand in  pocket Baseline: 3 days ago had surgery.  Stitches in place.  Decreased flexion and extension with increased edema  goal status:progressing    4.  Right grip and prehension strength increased to more than 75% compared to the left for patient to hold weight to work out and cook Baseline: 3 days postop from capsulectomy with ligament release on second and third metacarpal no strength  tested Goal status: progressing   5.  R wrist AROM increase to WNL to push and pull door open without increase symptoms  Baseline:  wrist flxion 60, ext 50 Goal status: progressing   ASSESSMENT:  CLINICAL IMPRESSION: Patient presents at occupational therapy evaluation 3 days postop capsulectomy of right second and third metacarpal with release of collateral ligaments.  Patient arrived with soft  cast in place.  Patient had earlier this year multiple irrigation and debridements involving right dorsal and volar hand wounds -with abscess and infection.  Patient was seen by therapy in September.  Pt remains limited by scar tissue and joint capsule tightness.  Patient continues to progress well and 3rd-5th digits for composite fist. Second digit improving during session able to maintain progress better. Patient remains limited in functional use of right dominant hand.  ROM improving but still has tightness in digits especially with colder weather.  Pt to monitor her repetitions of resistive putty to avoid increased pain in the hand. Pt continues to make progress, slight increase in grip to 26 pounds, pinch up by 1#, she has been working to increase MP flexion with use of knuckle bender and increasing resistance and time worn slowly, progressed to 6 bands on ulnar side for up to 8-10 mins at a time.   Good toleration of use of adjustable tension with coban working towards composite fist, this worked well last session and pt reported increased motion and decreased stiffness after session with no increased pain.  Added 3 sets of  putty exercises this date, Red resistive web can do with gripping, close and modified and use of foam piece to facilitate finger ABD between index and MF.  Patient continues benefit from skilled OT services to decrease edema, scar tissue and increase range of motion in flexion and extension of digits as well as wrist, increase strength to return to prior level of function.  PERFORMANCE DEFICITS: in functional skills including ADLs, IADLs, coordination, dexterity, edema, ROM, strength, pain, flexibility, and UE functional use,   and psychosocial skills including habits and routines and behaviors.   IMPAIRMENTS: are limiting patient from ADLs, IADLs, rest and sleep, work, play, leisure, and social participation.   COMORBIDITIES: has no other co-morbidities that affects occupational performance. Patient will benefit from skilled OT to address above impairments and improve overall function.  MODIFICATION OR ASSISTANCE TO COMPLETE EVALUATION: No modification of tasks or assist necessary to complete an evaluation.  OT OCCUPATIONAL PROFILE AND HISTORY: Problem focused assessment: Including review of records relating to presenting problem.  CLINICAL DECISION MAKING: LOW - limited treatment options, no task modification necessary  REHAB POTENTIAL: Good  EVALUATION COMPLEXITY: Low  PLAN:  OT FREQUENCY: 3 x wk initially - decrease depending on progress  OT DURATION: 8 weeks  PLANNED INTERVENTIONS: ADL's therapeutic exercise, therapeutic activity, manual therapy, scar mobilization, passive range of motion, splinting, paraffin, fluidotherapy, contrast bath, patient/family education, and DME and/or AE instructions  CONSULTED AND AGREED WITH PLAN OF CARE: Patient   Tyson Masin, OTR/L,CLT 09/06/2023, 2:12 PM

## 2023-09-16 ENCOUNTER — Ambulatory Visit: Payer: No Typology Code available for payment source | Attending: Student | Admitting: Occupational Therapy

## 2023-09-16 DIAGNOSIS — M6281 Muscle weakness (generalized): Secondary | ICD-10-CM | POA: Diagnosis present

## 2023-09-16 DIAGNOSIS — L905 Scar conditions and fibrosis of skin: Secondary | ICD-10-CM | POA: Diagnosis present

## 2023-09-16 DIAGNOSIS — M25641 Stiffness of right hand, not elsewhere classified: Secondary | ICD-10-CM | POA: Insufficient documentation

## 2023-09-16 NOTE — Therapy (Signed)
 OUTPATIENT OCCUPATIONAL THERAPY ORTHO TREATMENT Patient Name: Wanda Smith MRN: [id number] DOB:04/05/1961, 63 y.o., female  PCP: Marikay NP REFERRING PROVIDER: Kip PA  END OF SESSION:  OT End of Session - 09/16/23 0951     Visit Number 33    Number of Visits 38    Date for OT Re-Evaluation 09/26/23    OT Start Time 0816    OT Stop Time 0900    OT Time Calculation (min) 44 min    Activity Tolerance Patient tolerated treatment well    Behavior During Therapy [facility] for tasks assessed/performed              Past Medical History:  Diagnosis Date   Anemia    Anxiety    Arthritis    knee (R)   Asthma    Carpal tunnel syndrome    Cellulitis of right hand 03/30/2023   Chronic kidney disease, stage 3a (HCC)    Dr Marcelino   COPD (chronic obstructive pulmonary disease) (HCC)    Depression    Diabetes mellitus without complication (HCC)    Dyspnea    GERD (gastroesophageal reflux disease)    occ   Hyperlipidemia    Hypertension    Hypothyroidism    Increased serum lipids    MRSA infection 03/30/2023   right hand   Osteoporosis    Severe sepsis (HCC) 03/30/2023   Type 2 diabetes mellitus with renal manifestations (HCC)    Wears contact lenses    Past Surgical History:  Procedure Laterality Date   ABDOMINAL HYSTERECTOMY  1998   CARPAL TUNNEL RELEASE Right 07/26/2018   Procedure: CARPAL TUNNEL RELEASE ENDOSCOPIC;  Surgeon: Edie Norleen PARAS, MD;  Location: [facility];  Service: Orthopedics;  Laterality: Right;  diabetic - oral meds   CHOLECYSTECTOMY  2013   CLOSED REDUCTION METACARPAL WITH PERCUTANEOUS PINNING Right 07/05/2023   Procedure: MANIPULATION UNDER ANESTHESIA OF RIGHT INDEX AND LONG FINGERS WITH POSSIBLE CAPSULAR OF RIGHT INDEX AND LONG MCP JOINTS;  Surgeon: Edie Norleen PARAS, MD;  Location: [facility];  Service: Orthopedics;  Laterality: Right;   COLONOSCOPY WITH ESOPHAGOGASTRODUODENOSCOPY (EGD)  03/03/2012   COLONOSCOPY WITH  ESOPHAGOGASTRODUODENOSCOPY (EGD)  02/23/2022   COLONOSCOPY WITH ESOPHAGOGASTRODUODENOSCOPY (EGD)  01/16/1991   COLONOSCOPY WITH PROPOFOL  N/A 01/13/2018   Procedure: COLONOSCOPY WITH PROPOFOL ;  Surgeon: Viktoria Lamar DASEN, MD;  Location: [facility];  Service: Endoscopy;  Laterality: N/A;   HERNIA REPAIR  1966   I & D EXTREMITY Right 04/01/2023   Procedure: IRRIGATION AND DEBRIDEMENT EXTREMITY PREVENA PLACEMENT;  Surgeon: Edie Norleen PARAS, MD;  Location: [facility];  Service: Orthopedics;  Laterality: Right;   I-131 THYROID  ABLATION     INCISION AND DRAINAGE ABSCESS Right 03/30/2023   Procedure: INCISION AND DRAINAGE ABSCESS RIGHT HAND;  Surgeon: Edie Norleen PARAS, MD;  Location: [facility];  Service: Orthopedics;  Laterality: Right;   INCISION AND DRAINAGE OF WOUND Right 04/04/2023   Procedure: IRRIGATION AND DEBRIDEMENT RIGHT HAND DORSAL AND PALMAR INCISIONS WITH DELAYED PRIMARY CLOSURE;  Surgeon: Edie Norleen PARAS, MD;  Location: [facility];  Service: Orthopedics;  Laterality: Right;   ROTATOR CUFF REPAIR Left 2011   SHOULDER ARTHROSCOPY WITH SUBACROMIAL DECOMPRESSION, ROTATOR CUFF REPAIR AND BICEP TENDON REPAIR Right 10/21/2021   Procedure: SHOULDER ARTHROSCOPY WITH DEBRIDEMENT, DECOMPRESSION, BICEPS TENODESIS.;  Surgeon: Edie Norleen PARAS, MD;  Location: [facility];  Service: Orthopedics;  Laterality: Right;   TUBAL LIGATION  1993   Patient Active Problem List   Diagnosis Date Noted  MRSA infection 04/04/2023   Abscess of right hand 03/31/2023   Cellulitis of right hand 03/30/2023   Depression with anxiety 03/30/2023   HLD (hyperlipidemia) 03/30/2023   Type II diabetes mellitus with renal manifestations (HCC) 03/30/2023   Chronic kidney disease, stage 3a (HCC) 03/30/2023   Abnormal LFTs 03/30/2023   Severe sepsis (HCC) 03/30/2023   Hypothyroidism    Hypertension    COPD (chronic obstructive pulmonary disease) (HCC)     ONSET DATE: 03/30/23  REFERRING DIAG:Abscess of R hand -3 irrigation  surgeries/Capsulectomy 2nd and 3rd MC - release of collateral ligaments  THERAPY DIAG:  Muscle weakness (generalized)  Scar condition and fibrosis of skin  Stiffness of right hand, not elsewhere classified  Rationale for Evaluation and Treatment: Rehabilitation  SUBJECTIVE:   SUBJECTIVE STATEMENT:  Pt reports her right hand is starting to tingle some and just feels a little weird, I hope that is a sign that it's healing.  pt accompanied by: self  PERTINENT HISTORY:  Ortho note 04/20/23 Wanda Smith is a 63 y.o. female who presents today for a skin check status post multiple irrigation and debridements involving right dorsal and volar hand wounds and possible suture removal. The patient was admitted to the hospital after suffering a dog scratch which eventually became more swollen and painful. The patient underwent a CT scan of the right hand which demonstrated a abscess formation along the volar aspect of the hand initially. She underwent 1 irrigation debridement however the patient continued to have swelling and pain prompting a repeat CT scan which demonstrated abscess formation along the dorsal aspect of the hand. The patient underwent a second irrigation debridement and a final third procedure performed by Dr. Edie on 04/04/2023. The patient was evaluated last week where skin inspection demonstrated excellent granulation without any significant erythema or purulent drainage. The patient then had a follow-up with infectious disease where repeat CRP came back at a normal level. The patient does still have a oral prescription for linezolid  which she is scheduled to take until end this week. She denies any trauma or injury affecting the right hand since her last evaluation. She does report an occasional burning discomfort in the index finger. She is still taking occasional oxycodone . She reports a 0 out of 10 pain score at today's visit. Stitches remove- and to finish antibiotics and refer  to OT. ON 07/05/2023 - Pt had by Dr Edie   Attempted closed manipulation under anesthesia,  then had open capsulectomies with release of radial and ulnar collateral ligaments of both index and long finger MCP joints.- refer to OT to start ROM , splinting and tx 3 days later    PRECAUTIONS: None  WEIGHT BEARING RESTRICTIONS: No  PAIN:  Are you having pain?  More stiffness than pain  FALLS: Has patient fallen in last 6 months? No  LIVING ENVIRONMENT: Lives with: lives with their family and lives with their spouse  PLOF: Work on animator, gym 3 x wk , do own cooking and housework  PATIENT GOALS: Get my motion and strength back in the right hand so that I can carry and grip and hold objects  NEXT MD VISIT: middle Dec OBJECTIVE:   HAND DOMINANCE: Right  UPPER EXTREMITY ROM:     Active ROM Right eval Left eval R  04/29/23 05/03/23 05/16/23 R 9/16/24R 06/02/23 R R 07/08/23 R 07/18/23 R 08/02/23 R 08/09/2023 R 08/25/23 R 09/16/23  Shoulder flexion  Shoulder abduction               Shoulder adduction               Shoulder extension               Shoulder internal rotation               Shoulder external rotation               Elbow flexion               Elbow extension               Wrist flexion 35 110 50 50 63 72 80 60 76   70 80  Wrist extension 48 85 60 55 65 70 70 50 67 78  78 60 70  Wrist ulnar deviation 24      35 30    30  30   Wrist radial deviation 20      25 20    22 25   Wrist pronation               Wrist supination               (Blank rows = not tested)  Active ROM Right eval Left eval R   04/29/23 R  05/12/23 R  05/16/23 R 05/23/23 R 06/02/23 R 06/09/23 R 07/08/23 R 07/13/23  In session R 07/22/23 R 08/02/23 R 08/09/2023 R 08/25/23  R 09/16/23 Coming in/ after paraffin  Thumb MCP (0-60) 70                 Thumb IP (0-80) 35                 Thumb Radial abd/add (0-55) 45    50  50 50           Thumb Palmar abd/add (0-45) 45    50  65 55            Thumb Opposition to Small Finger Opposition to 3rd, 4th and 5th     Opposition to all digits   Opposition to base of 5th            Index MCP (0-90) 30   50 60 60( 65 66 70 70 75 75 75 75 75 80  70/80  Index PIP (0-100) 45( ext -30   55 ( ext -20 60 ( ext -10) 60 ( blocked 70) 60 66 70 50 70 70 70 70 80 80  70/75  Index DIP (0-70)       50             Long MCP (0-90)  45   50 60 65 70 70 75 ( 80 in session)  Ext -50 flexion65 80 75 80  80  80  Long PIP (0-100)  90( ext -20   95 ( ext -15 95 ( ext -5)  100 100 100 90 80 95 100  95  100  Long DIP (0-70)                    Ring MCP (0-90)  45   60 70 75 75 80 80( 90 in session Ext -45 flexion 70 90 90 85  85  80/85  Ring PIP (0-100)  100 (ext -10   100( ext 0  100 - full ext  100 100 100 100 95 95 100  100  100  Ring DIP (0-70)                    Little MCP (0-90)  75   75 80  80 85 85( 90 in session) 65 90 85 85  85  80/90  Little PIP (0-100)  90 (ext 0   100 ( ext 0 100- full ext   100 100 100 10 95 100 100  100  100  Little DIP (0-70)                    (Blank rows = not tested) I  05/23/23 GRIP R 14 lbs L 55 lbs, lat pinch R 4 lbs L 14 lbs and 3 point pinch R 5 lbs and L 10 lbs   05/30/23: Lat grip R 7 lbs , 14 L - 3 point R 6lbs , L 10 lbs   06/07/23 GRIP R 15 lbs L 55 lbs, lat pinch R 7 lbs L 14 lbs and 3 point pinch R 8 lbs and L 10 lbs 06/21/23 GRIP R 12 lbs L 48 lbs, lat pinch R 5 lbs L 14 lbs and 3 point pinch R 6 lbs and L 10 lbs 06/25/23 GRIP R 15 lbs L 48 lbs, lat pinch R 6lbs L 14 lbs and 3 point pinch R 8 lbs and L 10 lbs 08/02/23 GRIP R 19 lbs  08/16/23 Grip R 25 lbs, 48 lbs, L, Lat pinch R 7 lbs and L 14 lbs  08/25/23 Grip R 26 lbs, 48 lbs, L, Lat pinch R 8 lbs and L 14 lbs, 3 point R 8 lb 09/16/23 GRIP R 24 ( 26 after paraffin)  lbs L 55 lbs, lat pinch R 10 lbs L 14 lbs and 3 point pinch R 9 lbs and L 12 lbs  COORDINATION: Decreasing significantly because of stiffness and scar tissue in the second digit  EDEMA: R hand    COGNITION: Overall cognitive status: Within functional limits for tasks assessed  TODAY'S TREATMENT:                                                                                                                              DATE:  09/16/23  Therapeutic Exercises: Assess patient's active range of motion in right digits and wrist coming in today.   Patient reports having a harder time with the colder weather increased stiffness in left hand.  Especially third and second digits.  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 Grip decreased by 2 pounds but prehension increased. Discussed with patient doing some range of motion home exercises at least 3 times a day while working on the computer to maintain flexibility or improved. See flowsheet for range of motion.  Done paraffin with hand and fist  for 8 minutes patient do have a paraffin at home to use.  After which she did increase by 5 degrees to 10 degrees at second MCP and PIP as well as fourth of fifth metacarpal. Done some gentle passive range of motion and flexion stretches to metacarpals as well as a composite flexion stretch on thigh that patient can simulate every 2 hours while working on the computer.     Reinforced with patient to use her Green putty for lat pinch  and 3 point pinch end of the day 2 sets of 12-15 reps- pain free ,  Precision pinch into green putty retrieving 1 cm small objects 2-3 x day  Also reinforced to use her green putty.  Can use the teal putty for gripping to work on light composite flexion.  Patient wrist active range of motion within normal limits as well as strength 5/5 patient can stop doing wrist exercises   PATIENT EDUCATION: Education details: Findings of evaluation and home program Person educated: Patient Education method: Explanation, Demonstration,  Tactile cues, Verbal cues, and Handouts Education comprehension: verbalized understanding, returned demonstration, verbal cues required, tactile cues required, and needs further education   GOALS: Goals reviewed with patient? Yes  SHORT TERM GOALS: Target date: 4 wks  Patient to be independent in home program to decrease scar tissue and edema - increased extension and flexion of digits as well as wrist Baseline: Severe stiffness in digits, i 3 days ago surgery stitches in place  this date- Goal status: MET   LONG TERM GOALS: Target date: 8 wks Flexion of right digits improved for patient to touch palm to initiate use in ADLs with no increase symptoms Baseline: Surgery done 3 days ago -capsulectomy -MC flexion 65 to 70 degrees coming in.  PIP 52 degrees.   NOW 3rd thru 5th touching palm Goal status: progressing    2.  Patient increase extension of right digits 2nd thru 4th to be able to don gloves, wash face and reach aroundcylinder objects without issues Baseline: 3 days ago patient had capsulectomy.  Extension of metacarpals -50-second and -40 degrees at third NOW improved greatly -still not able to lay hand flat or do wall pushup Goal status: MET   3.  Scar tissue improved for patient to be able to make composite fist as well as increase extension to don gloves and put hand in pocket Baseline: 3 days ago had surgery.  Stitches in place.  Decreased flexion and extension with increased edema  goal status:progressing    4.  Right grip and prehension strength increased to more than 75% compared to the left for patient to hold weight to work out and cook Baseline: 3 days postop from capsulectomy with ligament release on second and third metacarpal no strength  tested Goal status: progressing   5.  R wrist AROM increase to WNL to push and pull door open without increase symptoms  Baseline:  wrist flxion 60, ext 50 Goal status: progressing   ASSESSMENT:  CLINICAL IMPRESSION: Patient  being treated by OT for postop capsulectomy of right second and third metacarpal with release of collateral ligaments. Patient had earlier this year multiple irrigation and debridements involving right dorsal and volar hand wounds -with abscess and infection.  Patient was seen by therapy in September.  Pt remains limited by scar tissue and joint capsule tightness.  Patient is right-hand dominant.  Patient made some good progress but having a harder time maintaining her range of motion going into the winter  months in the colder weather.  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  Reviewed with patient some changes in range of motion to focus on flexion 3 times a day to maintain her progress.  Grip is decreased because of decreased composite flexion.  Prehension strength improving.  Patient did lose some range of motion since a month ago.  Patient continues to have volar scar tissue limiting her second and third digit extension.  Will reassess next week if able to maintain her progress with her work schedule as well as the colder weather.  Patient has a second opinion at orthopedist next week.  Patient continues benefit from skilled OT services  decrease  scar tissue and increase range of motion in flexion and extension of digits as well as wrist, increase strength to return to prior level of function.  PERFORMANCE DEFICITS: in functional skills including ADLs, IADLs, coordination, dexterity, edema, ROM, strength, pain, flexibility, and UE functional use,   and psychosocial skills including habits and routines and behaviors.   IMPAIRMENTS: are limiting patient from ADLs, IADLs, rest and sleep, work, play, leisure, and social participation.   COMORBIDITIES: has no other co-morbidities that affects occupational performance. Patient will benefit from skilled OT to address above impairments and improve overall function.  MODIFICATION OR ASSISTANCE TO COMPLETE EVALUATION: No  modification of tasks or assist necessary to complete an evaluation.  OT OCCUPATIONAL PROFILE AND HISTORY: Problem focused assessment: Including review of records relating to presenting problem.  CLINICAL DECISION MAKING: LOW - limited treatment options, no task modification necessary  REHAB POTENTIAL: Good  EVALUATION COMPLEXITY: Low  PLAN:  OT FREQUENCY: 3 x wk initially - decrease depending on progress  OT DURATION: 8 weeks  PLANNED INTERVENTIONS: ADL's therapeutic exercise, therapeutic activity, manual therapy, scar mobilization, passive range of motion, splinting, paraffin, fluidotherapy, contrast bath, patient/family education, and DME and/or AE instructions  CONSULTED AND AGREED WITH PLAN OF CARE: Patient   Ancel Peters, OTR/L,CLT 09/16/2023, 9:52 AM

## 2023-09-22 ENCOUNTER — Ambulatory Visit: Payer: No Typology Code available for payment source | Admitting: Occupational Therapy

## 2023-09-22 DIAGNOSIS — M25641 Stiffness of right hand, not elsewhere classified: Secondary | ICD-10-CM

## 2023-09-22 DIAGNOSIS — M6281 Muscle weakness (generalized): Secondary | ICD-10-CM | POA: Diagnosis not present

## 2023-09-22 DIAGNOSIS — L905 Scar conditions and fibrosis of skin: Secondary | ICD-10-CM

## 2023-09-22 NOTE — Therapy (Signed)
OUTPATIENT OCCUPATIONAL THERAPY ORTHO TREATMENT Patient Name: Wanda Smith MRN: [id number] DOB:Apr 15, 1961, 63 y.o., female  PCP: Wanda Frederick NP REFERRING PROVIDER: Marney Doctor Smith  END OF SESSION:  OT End of Session - 09/22/23 0957     Visit Number 34    Number of Visits 38    Date for OT Re-Evaluation 09/26/23    OT Start Time 0817    OT Stop Time 0900    OT Time Calculation (min) 43 min    Activity Tolerance Patient tolerated treatment well    Behavior During Therapy [facility] for tasks assessed/performed              Past Medical History:  Diagnosis Date   Anemia    Anxiety    Arthritis    knee (R)   Asthma    Carpal tunnel syndrome    Cellulitis of right hand 03/30/2023   Chronic kidney disease, stage 3a (HCC)    Dr Wanda Smith   COPD (chronic obstructive pulmonary disease) (HCC)    Depression    Diabetes mellitus without complication (HCC)    Dyspnea    GERD (gastroesophageal reflux disease)    occ   Hyperlipidemia    Hypertension    Hypothyroidism    Increased serum lipids    MRSA infection 03/30/2023   right hand   Osteoporosis    Severe sepsis (HCC) 03/30/2023   Type 2 diabetes mellitus with renal manifestations (HCC)    Wears contact lenses    Past Surgical History:  Procedure Laterality Date   ABDOMINAL HYSTERECTOMY  1998   CARPAL TUNNEL RELEASE Right 07/26/2018   Procedure: CARPAL TUNNEL RELEASE ENDOSCOPIC;  Surgeon: Wanda Flake, MD;  Location: [facility];  Service: Orthopedics;  Laterality: Right;  diabetic - oral meds   CHOLECYSTECTOMY  2013   CLOSED REDUCTION METACARPAL WITH PERCUTANEOUS PINNING Right 07/05/2023   Procedure: MANIPULATION UNDER ANESTHESIA OF RIGHT INDEX AND LONG FINGERS WITH POSSIBLE CAPSULAR OF RIGHT INDEX AND LONG MCP JOINTS;  Surgeon: Wanda Flake, MD;  Location: [facility];  Service: Orthopedics;  Laterality: Right;   COLONOSCOPY WITH ESOPHAGOGASTRODUODENOSCOPY (EGD)  03/03/2012   COLONOSCOPY WITH  ESOPHAGOGASTRODUODENOSCOPY (EGD)  02/23/2022   COLONOSCOPY WITH ESOPHAGOGASTRODUODENOSCOPY (EGD)  01/16/1991   COLONOSCOPY WITH PROPOFOL N/A 01/13/2018   Procedure: COLONOSCOPY WITH PROPOFOL;  Surgeon: Wanda Jun, MD;  Location: [facility];  Service: Endoscopy;  Laterality: N/A;   HERNIA REPAIR  1966   I & D EXTREMITY Right 04/01/2023   Procedure: IRRIGATION AND DEBRIDEMENT EXTREMITY PREVENA PLACEMENT;  Surgeon: Wanda Flake, MD;  Location: [facility];  Service: Orthopedics;  Laterality: Right;   I-131 THYROID ABLATION     INCISION AND DRAINAGE ABSCESS Right 03/30/2023   Procedure: INCISION AND DRAINAGE ABSCESS RIGHT HAND;  Surgeon: Wanda Flake, MD;  Location: [facility];  Service: Orthopedics;  Laterality: Right;   INCISION AND DRAINAGE OF WOUND Right 04/04/2023   Procedure: IRRIGATION AND DEBRIDEMENT RIGHT HAND DORSAL AND PALMAR INCISIONS WITH DELAYED PRIMARY CLOSURE;  Surgeon: Wanda Flake, MD;  Location: [facility];  Service: Orthopedics;  Laterality: Right;   ROTATOR CUFF REPAIR Left 2011   SHOULDER ARTHROSCOPY WITH SUBACROMIAL DECOMPRESSION, ROTATOR CUFF REPAIR AND BICEP TENDON REPAIR Right 10/21/2021   Procedure: SHOULDER ARTHROSCOPY WITH DEBRIDEMENT, DECOMPRESSION, BICEPS TENODESIS.;  Surgeon: Wanda Flake, MD;  Location: [facility];  Service: Orthopedics;  Laterality: Right;   TUBAL LIGATION  1993   Patient Active Problem List   Diagnosis Date Noted  MRSA infection 04/04/2023   Abscess of right hand 03/31/2023   Cellulitis of right hand 03/30/2023   Depression with anxiety 03/30/2023   HLD (hyperlipidemia) 03/30/2023   Type II diabetes mellitus with renal manifestations (HCC) 03/30/2023   Chronic kidney disease, stage 3a (HCC) 03/30/2023   Abnormal LFTs 03/30/2023   Severe sepsis (HCC) 03/30/2023   Hypothyroidism    Hypertension    COPD (chronic obstructive pulmonary disease) (HCC)     ONSET DATE: 03/30/23  REFERRING DIAG:Abscess of R hand -3 irrigation  surgeries/Capsulectomy 2nd and 3rd MC - release of collateral ligaments  THERAPY DIAG:  Muscle weakness (generalized)  Scar condition and fibrosis of skin  Stiffness of right hand, not elsewhere classified  Rationale for Evaluation and Treatment: Rehabilitation  SUBJECTIVE:   SUBJECTIVE STATEMENT: I have seen the doctor for second opinion.  He is more shoulder guy.  He did give me a shot at my index and middle finger.  It may be does not feel as stiff when I try and bend it.  I told him I do not want any more surgeries.  I am going to visit my mom this coming week so we will have more time during the day then to work my hand.  pt accompanied by: self  PERTINENT HISTORY:  Ortho note 04/20/23 Wanda Smith is a 63 y.o. female who presents today for a skin check status post multiple irrigation and debridements involving right dorsal and volar hand wounds and possible suture removal. The patient was admitted to the hospital after suffering a dog scratch which eventually became more swollen and painful. The patient underwent a CT scan of the right hand which demonstrated a abscess formation along the volar aspect of the hand initially. She underwent 1 irrigation debridement however the patient continued to have swelling and pain prompting a repeat CT scan which demonstrated abscess formation along the dorsal aspect of the hand. The patient underwent a second irrigation debridement and a final third procedure performed by Dr. Joice Smith on 04/04/2023. The patient was evaluated last week where skin inspection demonstrated excellent granulation without any significant erythema or purulent drainage. The patient then had a follow-up with infectious disease where repeat CRP came back at a normal level. The patient does still have a oral prescription for linezolid which she is scheduled to take until end this week. She denies any trauma or injury affecting the right hand since her last evaluation. She does  report an occasional burning discomfort in the index finger. She is still taking occasional oxycodone. She reports a 0 out of 10 pain score at today's visit. Stitches remove- and to finish antibiotics and refer to OT. ON 07/05/2023 - Pt had by Dr Wanda Smith   Attempted closed manipulation under anesthesia,  then had open capsulectomies with release of radial and ulnar collateral ligaments of both index and long finger MCP joints.- refer to OT to start ROM , splinting and tx 3 days later    PRECAUTIONS: None  WEIGHT BEARING RESTRICTIONS: No  PAIN:  Are you having pain?  More stiffness than pain  FALLS: Has patient fallen in last 6 months? No  LIVING ENVIRONMENT: Lives with: lives with their family and lives with their spouse  PLOF: Work on [employer], gym 3 x wk , do own cooking and housework  PATIENT GOALS: Get my motion and strength back in the right hand so that I can carry and grip and hold objects  NEXT MD VISIT: middle Dec OBJECTIVE:  HAND DOMINANCE: Right  UPPER EXTREMITY ROM:     Active ROM Right eval Left eval R  04/29/23 05/03/23 05/16/23 R 9/16/24R 06/02/23 R R 07/08/23 R 07/18/23 R 08/02/23 R 08/09/2023 R 08/25/23 R 09/16/23  Shoulder flexion               Shoulder abduction               Shoulder adduction               Shoulder extension               Shoulder internal rotation               Shoulder external rotation               Elbow flexion               Elbow extension               Wrist flexion 35 110 50 50 63 72 80 60 76   70 80  Wrist extension 48 85 60 55 65 70 70 50 67 78  78 60 70  Wrist ulnar deviation 24      35 30    30  30   Wrist radial deviation 20      25 20    22 25   Wrist pronation               Wrist supination               (Blank rows = not tested)  Active ROM Right eval Left eval R   04/29/23 R  05/12/23 R  05/16/23 R 05/23/23 R 06/02/23 R 06/09/23 R 07/08/23 R 07/13/23  In session R 07/22/23 R 08/02/23 R 08/09/2023 R 08/25/23   R 09/16/23 Coming in/ after paraffin R 09/22/23 R coming in and after heat and ROM   Thumb MCP (0-60) 70                  Thumb IP (0-80) 35                  Thumb Radial abd/add (0-55) 45    50  50 50            Thumb Palmar abd/add (0-45) 45    50  65 55            Thumb Opposition to Small Finger Opposition to 3rd, 4th and 5th     Opposition to all digits   Opposition to base of 5th             Index MCP (0-90) 30   50 60 60( 65 66 70 70 75 75 75 75 75 80  70/80 75/80  Index PIP (0-100) 45( ext -30   55 ( ext -20 60 ( ext -10) 60 ( blocked 70) 60 66 70 50 70 70 70 70 80 80  70/75   Index DIP (0-70)       50              Long MCP (0-90)  45   50 60 65 70 70 75 ( 80 in session)  Ext -50 flexion65 80 75 80  80  80 80/85  Long PIP (0-100)  90( ext -20   95 ( ext -15 95 ( ext -5)  100 100 100 90 80 95 100  95  100   Long DIP (0-70)  Ring MCP (0-90)  45   60 70 75 75 80 80( 90 in session Ext -45 flexion 70 90 90 85  85  80/85 80/85  Ring PIP (0-100)  100 (ext -10   100( ext 0  100 - full ext  100 100 100 100 95 95 100  100  100   Ring DIP (0-70)                     Little MCP (0-90)  75   75 80  80 85 85( 90 in session) 65 90 85 85  85  80/90 85/90  Little PIP (0-100)  90 (ext 0   100 ( ext 0 100- full ext   100 100 100 10 95 100 100  100  100   Little DIP (0-70)                     (Blank rows = not tested) I  05/23/23 GRIP R 14 lbs L 55 lbs, lat pinch R 4 lbs L 14 lbs and 3 point pinch R 5 lbs and L 10 lbs   05/30/23: Lat grip R 7 lbs , 14 L - 3 point R 6lbs , L 10 lbs   06/07/23 GRIP R 15 lbs L 55 lbs, lat pinch R 7 lbs L 14 lbs and 3 point pinch R 8 lbs and L 10 lbs 06/21/23 GRIP R 12 lbs L 48 lbs, lat pinch R 5 lbs L 14 lbs and 3 point pinch R 6 lbs and L 10 lbs 06/25/23 GRIP R 15 lbs L 48 lbs, lat pinch R 6lbs L 14 lbs and 3 point pinch R 8 lbs and L 10 lbs 08/02/23 GRIP R 19 lbs  08/16/23 Grip R 25 lbs, 48 lbs, L, Lat pinch R 7 lbs and L 14 lbs  08/25/23 Grip R 26  lbs, 48 lbs, L, Lat pinch R 8 lbs and L 14 lbs, 3 point R 8 lb 09/16/23 GRIP R 24 ( 26 after paraffin)  lbs L 55 lbs, lat pinch R 10 lbs L 14 lbs and 3 point pinch R 9 lbs and L 12 lbs  COORDINATION: Decreasing significantly because of stiffness and scar tissue in the second digit  EDEMA: R hand   COGNITION: Overall cognitive status: Within functional limits for tasks assessed  TODAY'S TREATMENT:                                                                                                                              DATE:  09/22/23  Therapeutic Exercises: Assess patient's active range of motion in right digits coming in this morning  She had a cortisone shot earlier this week in second and third A1 pulleys   patient reports having a harder time with the colder weather increased stiffness in left hand.  Especially third and second digits.  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   Done paraffin with hand and fist for 8 minutes  - patient do have a paraffin at home - this date her knuckle Bender splint 2 x 5 minutes and increased the tension to 6 bands on the fifth digit and 4 bands on the radial side to increase MC flexion 3 times a day while working after some paraffin  Show some improvement after the second 5 minutes with knuckle Bender    After which she did increase by 5 degrees to 10 degrees at second  thru 5th MCP  Followed by placing hold in composite flexion. Patient to use her paraffin and her knuckle Bender to get that motion to not irritate and overuse the flexors.    Reinforced with patient to use her Green putty for lat pinch  and 3 point pinch end of the day 2 sets of 12-15 reps- pain free ,  Precision pinch into green putty retrieving 1 cm small objects 2-3 x day  Also reinforced to  use her green putty.  Can use the teal putty for gripping to work on light composite flexion.    PATIENT EDUCATION: Education details: Findings of evaluation and home program Person educated: Patient Education method: Explanation, Demonstration, Tactile cues, Verbal cues, and Handouts Education comprehension: verbalized understanding, returned demonstration, verbal cues required, tactile cues required, and needs further education   GOALS: Goals reviewed with patient? Yes  SHORT TERM GOALS: Target date: 4 wks  Patient to be independent in home program to decrease scar tissue and edema - increased extension and flexion of digits as well as wrist Baseline: Severe stiffness in digits, i 3 days ago surgery stitches in place  this date- Goal status: MET   LONG TERM GOALS: Target date: 8 wks Flexion of right digits improved for patient to touch palm to initiate use in ADLs with no increase symptoms Baseline: Surgery done 3 days ago -capsulectomy -MC flexion 65 to 70 degrees coming in.  PIP 52 degrees.   NOW 3rd thru 5th touching palm Goal status: progressing    2.  Patient increase extension of right digits 2nd thru 4th to be able to don gloves, wash face and reach aroundcylinder objects without issues Baseline: 3 days ago patient had capsulectomy.  Extension of metacarpals -50-second and -40 degrees at third NOW improved greatly -still not able to lay hand flat or do wall pushup Goal status: MET   3.  Scar tissue improved for patient to be able to make composite fist as well as increase extension to don gloves and put hand in pocket Baseline: 3 days ago had surgery.  Stitches in place.  Decreased flexion and extension with increased edema  goal status:progressing    4.  Right grip and prehension strength increased to more than 75% compared to the left for patient to hold weight to work out and cook Baseline: 3 days postop from capsulectomy with ligament release on second and third  metacarpal no strength  tested Goal status: progressing   5.  R wrist AROM increase to WNL to push and pull door open without increase symptoms  Baseline:  wrist flxion 60, ext 50 Goal status: progressing   ASSESSMENT:  CLINICAL IMPRESSION: Patient being treated by OT for postop capsulectomy of right second and third metacarpal  with release of collateral ligaments. Patient had earlier this year multiple irrigation and debridements involving right dorsal and volar hand wounds -with abscess and infection.  Patient was seen by therapy in September.  Pt remains limited by scar tissue and joint capsule tightness.  Patient is right-hand dominant.  Patient made some good progress but having a harder time maintaining her range of motion going into the winter months in the colder weather.  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  Patient seen another orthopedic for second opinion.  Had a cortisone shot in the second and third A1 pulley.  Appear to have less edema and stiffness.  Reviewed again with patient some changes in range of motion to focus on flexion 3 times a day to maintain her progress.  Patient to use her knuckle Bender after the paraffin bath 2 x 5 minutes-did increase tension to 4 and 6 rubber bands-then followed by placing hold and putty.  Patient will be off next week so we will have more time to work on motion.  Patient maintaining second MC extension at -30.  Patient continues benefit from skilled OT services  decrease  scar tissue and increase range of motion in flexion and extension of digits as well as wrist, increase strength to return to prior level of function.  PERFORMANCE DEFICITS: in functional skills including ADLs, IADLs, coordination, dexterity, edema, ROM, strength, pain, flexibility, and UE functional use,   and psychosocial skills including habits and routines and behaviors.   IMPAIRMENTS: are limiting patient from ADLs, IADLs, rest and  sleep, work, play, leisure, and social participation.   COMORBIDITIES: has no other co-morbidities that affects occupational performance. Patient will benefit from skilled OT to address above impairments and improve overall function.  MODIFICATION OR ASSISTANCE TO COMPLETE EVALUATION: No modification of tasks or assist necessary to complete an evaluation.  OT OCCUPATIONAL PROFILE AND HISTORY: Problem focused assessment: Including review of records relating to presenting problem.  CLINICAL DECISION MAKING: LOW - limited treatment options, no task modification necessary  REHAB POTENTIAL: Good  EVALUATION COMPLEXITY: Low  PLAN:  OT FREQUENCY: 3 x wk initially - decrease depending on progress  OT DURATION: 8 weeks  PLANNED INTERVENTIONS: ADL's therapeutic exercise, therapeutic activity, manual therapy, scar mobilization, passive range of motion, splinting, paraffin, fluidotherapy, contrast bath, patient/family education, and DME and/or AE instructions  CONSULTED AND AGREED WITH PLAN OF CARE: Patient   Oletta Cohn, OTR/L,CLT 09/22/2023, 9:58 AM

## 2023-10-03 ENCOUNTER — Encounter: Payer: No Typology Code available for payment source | Admitting: Occupational Therapy

## 2023-10-04 ENCOUNTER — Ambulatory Visit: Payer: No Typology Code available for payment source | Admitting: Occupational Therapy

## 2023-10-04 DIAGNOSIS — M25641 Stiffness of right hand, not elsewhere classified: Secondary | ICD-10-CM

## 2023-10-04 DIAGNOSIS — M6281 Muscle weakness (generalized): Secondary | ICD-10-CM | POA: Diagnosis not present

## 2023-10-04 DIAGNOSIS — L905 Scar conditions and fibrosis of skin: Secondary | ICD-10-CM

## 2023-10-04 NOTE — Therapy (Signed)
OUTPATIENT OCCUPATIONAL THERAPY ORTHO TREATMENT Patient Name: Wanda Smith MRN: [id number] DOB:[birth year], 63 y.o., female  PCP: Wanda Frederick NP REFERRING PROVIDER: Marney Doctor PA  END OF SESSION:  OT End of Session - 10/04/23 0812     Visit Number 35    Number of Visits 40   Date for OT Re-Evaluation 12/27/23    OT Start Time 0815    Activity Tolerance Patient tolerated treatment well    Behavior During Therapy [facility] for tasks assessed/performed              Past Medical History:  Diagnosis Date   Anemia    Anxiety    Arthritis    knee (R)   Asthma    Carpal tunnel syndrome    Cellulitis of right hand 03/30/2023   Chronic kidney disease, stage 3a (HCC)    Wanda Smith   COPD (chronic obstructive pulmonary disease) (HCC)    Depression    Diabetes mellitus without complication (HCC)    Dyspnea    GERD (gastroesophageal reflux disease)    occ   Hyperlipidemia    Hypertension    Hypothyroidism    Increased serum lipids    MRSA infection 03/30/2023   right hand   Osteoporosis    Severe sepsis (HCC) 03/30/2023   Type 2 diabetes mellitus with renal manifestations (HCC)    Wears contact lenses    Past Surgical History:  Procedure Laterality Date   ABDOMINAL HYSTERECTOMY  1998   CARPAL TUNNEL RELEASE Right 07/26/2018   Procedure: CARPAL TUNNEL RELEASE ENDOSCOPIC;  Surgeon: Wanda Flake, MD;  Location: [facility];  Service: Orthopedics;  Laterality: Right;  diabetic - oral meds   CHOLECYSTECTOMY  2013   CLOSED REDUCTION METACARPAL WITH PERCUTANEOUS PINNING Right 07/05/2023   Procedure: MANIPULATION UNDER ANESTHESIA OF RIGHT INDEX AND LONG FINGERS WITH POSSIBLE CAPSULAR OF RIGHT INDEX AND LONG MCP JOINTS;  Surgeon: Wanda Flake, MD;  Location: [facility];  Service: Orthopedics;  Laterality: Right;   COLONOSCOPY WITH ESOPHAGOGASTRODUODENOSCOPY (EGD)  03/03/2012   COLONOSCOPY WITH ESOPHAGOGASTRODUODENOSCOPY (EGD)  02/23/2022   COLONOSCOPY WITH  ESOPHAGOGASTRODUODENOSCOPY (EGD)  01/16/1991   COLONOSCOPY WITH PROPOFOL N/A 01/13/2018   Procedure: COLONOSCOPY WITH PROPOFOL;  Surgeon: Wanda Jun, MD;  Location[facility];  Service: Endoscopy;  Laterality: N/A;   HERNIA REPAIR  1966   I & D EXTREMITY Right 04/01/2023   Procedure: IRRIGATION AND DEBRIDEMENT EXTREMITY PREVENA PLACEMENT;  Surgeon: Wanda Flake, MD;  Location: [facility];  Service: Orthopedics;  Laterality: Right;   I-131 THYROID ABLATION     INCISION AND DRAINAGE ABSCESS Right 03/30/2023   Procedure: INCISION AND DRAINAGE ABSCESS RIGHT HAND;  Surgeon: Wanda Flake, MD;  Location: [facility];  Service: Orthopedics;  Laterality: Right;   INCISION AND DRAINAGE OF WOUND Right 04/04/2023   Procedure: IRRIGATION AND DEBRIDEMENT RIGHT HAND DORSAL AND PALMAR INCISIONS WITH DELAYED PRIMARY CLOSURE;  Surgeon: Wanda Flake, MD;  Location: [facility];  Service: Orthopedics;  Laterality: Right;   ROTATOR CUFF REPAIR Left 2011   SHOULDER ARTHROSCOPY WITH SUBACROMIAL DECOMPRESSION, ROTATOR CUFF REPAIR AND BICEP TENDON REPAIR Right 10/21/2021   Procedure: SHOULDER ARTHROSCOPY WITH DEBRIDEMENT, DECOMPRESSION, BICEPS TENODESIS.;  Surgeon: Wanda Flake, MD;  Location: [facility];  Service: Orthopedics;  Laterality: Right;   TUBAL LIGATION  1993   Patient Active Problem List   Diagnosis Date Noted   MRSA infection 04/04/2023   Abscess of right hand 03/31/2023   Cellulitis of right hand  03/30/2023   Depression with anxiety 03/30/2023   HLD (hyperlipidemia) 03/30/2023   Type II diabetes mellitus with renal manifestations (HCC) 03/30/2023   Chronic kidney disease, stage 3a (HCC) 03/30/2023   Abnormal LFTs 03/30/2023   Severe sepsis (HCC) 03/30/2023   Hypothyroidism    Hypertension    COPD (chronic obstructive pulmonary disease) (HCC)     ONSET DATE: 03/30/23  REFERRING DIAG:Abscess of R hand -3 irrigation surgeries/Capsulectomy 2nd and 3rd MC - release of collateral  ligaments  THERAPY DIAG:  Muscle weakness (generalized)  Scar condition and fibrosis of skin  Stiffness of right hand, not elsewhere classified  Rationale for Evaluation and Treatment: Rehabilitation  SUBJECTIVE:   SUBJECTIVE STATEMENT: I have seen the Smith for second opinion.  He is more shoulder guy.  He did give me a shot at my index and middle finger.  It may be does not feel as stiff when I try and bend it.  I told him I do not want any more surgeries.  I am going to visit my mom this coming week so we will have more time during the day then to work my hand.  pt accompanied by: self  PERTINENT HISTORY:  Ortho note 04/20/23 Wanda Smith is a 63 y.o. female who presents today for a skin check status post multiple irrigation and debridements involving right dorsal and volar hand wounds and possible suture removal. The patient was admitted to the hospital after suffering a dog scratch which eventually became more swollen and painful. The patient underwent a CT scan of the right hand which demonstrated a abscess formation along the volar aspect of the hand initially. She underwent 1 irrigation debridement however the patient continued to have swelling and pain prompting a repeat CT scan which demonstrated abscess formation along the dorsal aspect of the hand. The patient underwent a second irrigation debridement and a final third procedure performed by Wanda. Joice Smith on 04/04/2023. The patient was evaluated last week where skin inspection demonstrated excellent granulation without any significant erythema or purulent drainage. The patient then had a follow-up with infectious disease where repeat CRP came back at a normal level. The patient does still have a oral prescription for linezolid which she is scheduled to take until end this week. She denies any trauma or injury affecting the right hand since her last evaluation. She does report an occasional burning discomfort in the index finger. She  is still taking occasional oxycodone. She reports a 0 out of 10 pain score at today's visit. Stitches remove- and to finish antibiotics and refer to OT. ON 07/05/2023 - Pt had by Wanda Wanda Smith   Attempted closed manipulation under anesthesia,  then had open capsulectomies with release of radial and ulnar collateral ligaments of both index and long finger MCP joints.- refer to OT to start ROM , splinting and tx 3 days later    PRECAUTIONS: None  WEIGHT BEARING RESTRICTIONS: No  PAIN:  Are you having pain?  More stiffness than pain  FALLS: Has patient fallen in last 6 months? No  LIVING ENVIRONMENT: Lives with: lives with their family and lives with their spouse  PLOF: Work on [employer], gym 3 x wk , do own cooking and housework  PATIENT GOALS: Get my motion and strength back in the right hand so that I can carry and grip and hold objects  NEXT MD VISIT: middle Dec OBJECTIVE:   HAND DOMINANCE: Right  UPPER EXTREMITY ROM:     Active ROM Right eval  Left eval R  04/29/23 05/03/23 05/16/23 R 9/16/24R 06/02/23 R R 07/08/23 R 07/18/23 R 08/02/23 R 08/09/2023 R 08/25/23 R 09/16/23  Shoulder flexion               Shoulder abduction               Shoulder adduction               Shoulder extension               Shoulder internal rotation               Shoulder external rotation               Elbow flexion               Elbow extension               Wrist flexion 35 110 50 50 63 72 80 60 76   70 80  Wrist extension 48 85 60 55 65 70 70 50 67 78  78 60 70  Wrist ulnar deviation 24      35 30    30  30   Wrist radial deviation 20      25 20    22 25   Wrist pronation               Wrist supination               (Blank rows = not tested)  Active ROM Right eval Left eval R   04/29/23 R  05/12/23 R  05/16/23 R 05/23/23 R 06/02/23 R 06/09/23 R 07/08/23 R 07/13/23  In session R 07/22/23 R 08/02/23 R 08/09/2023 R 08/25/23  R 09/16/23 Coming in/ after paraffin R 09/22/23 R coming in and  after heat and ROM  R 10/03/24 SOC / after paraffin  Thumb MCP (0-60) 70                   Thumb IP (0-80) 35                   Thumb Radial abd/add (0-55) 45    50  50 50             Thumb Palmar abd/add (0-45) 45    50  65 55             Thumb Opposition to Small Finger Opposition to 3rd, 4th and 5th     Opposition to all digits   Opposition to base of 5th              Index MCP (0-90) 30   50 60 60( 65 66 70 70 75 75 75 75 75 80  70/80 75/80 75/80   Index PIP (0-100) 45( ext -30   55 ( ext -20 60 ( ext -10) 60 ( blocked 70) 60 66 70 50 70 70 70 70 80 80  70/75  70  Index DIP (0-70)       50               Long MCP (0-90)  45   50 60 65 70 70 75 ( 80 in session)  Ext -50 flexion65 80 75 80  80  80 80/85 80/85  Long PIP (0-100)  90( ext -20   95 ( ext -15 95 ( ext -5)  100 100 100 90 80 95 100  95  100    Long DIP (0-70)  Ring MCP (0-90)  45   60 70 75 75 80 80( 90 in session Ext -45 flexion 70 90 90 85  85  80/85 80/85 85/85   Ring PIP (0-100)  100 (ext -10   100( ext 0  100 - full ext  100 100 100 100 95 95 100  100  100    Ring DIP (0-70)                      Little MCP (0-90)  75   75 80  80 85 85( 90 in session) 65 90 85 85  85  80/90 85/90 85/90   Little PIP (0-100)  90 (ext 0   100 ( ext 0 100- full ext   100 100 100 10 95 100 100  100  100    Little DIP (0-70)                      (Blank rows = not tested) I  05/23/23 GRIP R 14 lbs L 55 lbs, lat pinch R 4 lbs L 14 lbs and 3 point pinch R 5 lbs and L 10 lbs   05/30/23: Lat grip R 7 lbs , 14 L - 3 point R 6lbs , L 10 lbs   06/07/23 GRIP R 15 lbs L 55 lbs, lat pinch R 7 lbs L 14 lbs and 3 point pinch R 8 lbs and L 10 lbs 06/21/23 GRIP R 12 lbs L 48 lbs, lat pinch R 5 lbs L 14 lbs and 3 point pinch R 6 lbs and L 10 lbs 06/25/23 GRIP R 15 lbs L 48 lbs, lat pinch R 6lbs L 14 lbs and 3 point pinch R 8 lbs and L 10 lbs 08/02/23 GRIP R 19 lbs  08/16/23 Grip R 25 lbs, 48 lbs, L, Lat pinch R 7 lbs and L 14 lbs  08/25/23 Grip  R 26 lbs, 48 lbs, L, Lat pinch R 8 lbs and L 14 lbs, 3 point R 8 lb 10/04/23 GRIP R 30  lbs L 55 lbs, lat pinch R 9 lbs L 14 lbs and 3 point pinch R 10 lbs and L 12 lbs   COORDINATION: Decreasing significantly because of stiffness and scar tissue in the second digit  EDEMA: R hand   COGNITION: Overall cognitive status: Within functional limits for tasks assessed  TODAY'S TREATMENT:                                                                                                                              DATE:  10/04/23  Therapeutic Exercises: AROM same than prior to vacation -coming in - maintain flexion and extention of digits  Cont to have decrease edema and pain at 2nd volar MC  Grip increase to 30 lbs and 3 point pinch to 10 lbs  Pt was on vacation for week .  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   Done paraffin with hand and fist for 8 minutes  - patient do have a paraffin at home - this date her knuckle Bender splint 1 x 5 minutes and increased the tension to 6 bands on the fifth digit and 4 bands on the radial side to increase MC flexion 3 times a day while working after some paraffin  Show some improvement after 5 minutes with knuckle Bender Pt arrive with 4 bands on ulnar side    After which she did increase by 5 degrees second  thru 5th MCP  Followed by placing hold in composite flexion. Patient to use her paraffin and her knuckle Bender to get that motion to not irritate and overuse the flexors.    Reinforced with patient to use her Green putty for lat pinch  and 3 point pinch end of the day 2 sets of 12-15 reps- pain free ,  Precision pinch into green putty retrieving 1 cm small objects 2-3 x day  Also reinforced to use her green putty.     PATIENT EDUCATION: Education details:  Findings of evaluation and home program Person educated: Patient Education method: Explanation, Demonstration, Tactile cues, Verbal cues, and Handouts Education comprehension: verbalized understanding, returned demonstration, verbal cues required, tactile cues required, and needs further education   GOALS: Goals reviewed with patient? Yes  SHORT TERM GOALS: Target date: 4 wks  Patient to be independent in home program to decrease scar tissue and edema - increased extension and flexion of digits as well as wrist Baseline: Severe stiffness in digits, i 3 days ago surgery stitches in place  this date- Goal status: MET   LONG TERM GOALS: Target date: 8 wks Flexion of right digits improved for patient to touch palm to initiate use in ADLs with no increase symptoms Baseline: Surgery done 3 days ago -capsulectomy -MC flexion 65 to 70 degrees coming in.  PIP 52 degrees.   NOW 3rd thru 5th touching palm Goal status: progressing    2.  Patient increase extension of right digits 2nd thru 4th to be able to don gloves, wash face and reach aroundcylinder objects without issues Baseline: 3 days ago patient had capsulectomy.  Extension of metacarpals -50-second and -40 degrees at third NOW improved greatly -still not able to lay hand flat or do wall pushup Goal status: MET   3.  Scar tissue improved for patient to be able to make composite fist as well as increase extension to don gloves and put hand in pocket Baseline:   Stitches in place.  Decreased flexion and extension with increased edema - NOW 2nd MC ext -30 and 75-85 MC flexion- 70- at 2nd PIP goal status:progressing    4.  Right grip and prehension strength increased to more than 75% compared to the left for patient to hold weight to work out and cook Baseline: 3 days postop from capsulectomy with ligament release on second and third metacarpal NOW grip 30 lbs, lat 9lbs and 3 point 10 lbs  Goal status: progressing   5.  R wrist AROM  increase to WNL to push and pull door open without increase symptoms  Baseline:  wrist flxion 60, ext 50 Goal status:MET  ASSESSMENT:  CLINICAL IMPRESSION: Patient being treated by OT for postop capsulectomy of right  second and third metacarpal with release of collateral ligaments. Patient had earlier 2024 multiple irrigation and debridements involving right dorsal and volar hand wounds -with abscess and infection.  Patient was seen by therapy in September.  Pt remains limited by scar tissue and joint capsule tightness.  Patient is right-hand dominant.  Patient made some good progress but having a harder time maintaining her range of motion going into the winter months in the colder weather.  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  Patient seen another orthopedic for second opinion.  Had a cortisone shot in the second and third A1 pulley.  Appear to have less edema and stiffness.  Reviewed again with patient some changes in range of motion to focus on flexion 3 times a day to maintain her progress.  Patient to use her knuckle Bender after the paraffin bath 2 x 5 minutes-did increase tension to 4 and 6 rubber bands again - had less on today-then followed by placing hold and putty. Pt decrease to every 2-3 wks over the next 3 months to monitor if cont to progress and maintain ROM in R dominant hand.  Patient continues benefit from skilled OT services  decrease  scar tissue and increase range of motion in flexion and extension of digits as well as wrist, increase strength to return to prior level of function.  PERFORMANCE DEFICITS: in functional skills including ADLs, IADLs, coordination, dexterity, edema, ROM, strength, pain, flexibility, and UE functional use,   and psychosocial skills including habits and routines and behaviors.   IMPAIRMENTS: are limiting patient from ADLs, IADLs, rest and sleep, work, play, leisure, and social participation.   COMORBIDITIES:  has no other co-morbidities that affects occupational performance. Patient will benefit from skilled OT to address above impairments and improve overall function.  MODIFICATION OR ASSISTANCE TO COMPLETE EVALUATION: No modification of tasks or assist necessary to complete an evaluation.  OT OCCUPATIONAL PROFILE AND HISTORY: Problem focused assessment: Including review of records relating to presenting problem.  CLINICAL DECISION MAKING: LOW - limited treatment options, no task modification necessary  REHAB POTENTIAL: Good  EVALUATION COMPLEXITY: Low  PLAN:  OT FREQUENCY: 3-5 visits  OT DURATION: 12 wks   PLANNED INTERVENTIONS: ADL's therapeutic exercise, therapeutic activity, manual therapy, scar mobilization, passive range of motion, splinting, paraffin, fluidotherapy, contrast bath, patient/family education, and DME and/or AE instructions  CONSULTED AND AGREED WITH PLAN OF CARE: Patient   Oletta Cohn, OTR/L,CLT 10/04/2023, 9:05 AM

## 2023-10-13 ENCOUNTER — Other Ambulatory Visit: Payer: Self-pay | Admitting: Physician Assistant

## 2023-10-13 DIAGNOSIS — Z1231 Encounter for screening mammogram for malignant neoplasm of breast: Secondary | ICD-10-CM

## 2023-10-25 ENCOUNTER — Ambulatory Visit: Payer: No Typology Code available for payment source | Attending: Student | Admitting: Occupational Therapy

## 2023-10-25 DIAGNOSIS — M6281 Muscle weakness (generalized): Secondary | ICD-10-CM | POA: Insufficient documentation

## 2023-10-25 DIAGNOSIS — M25641 Stiffness of right hand, not elsewhere classified: Secondary | ICD-10-CM | POA: Insufficient documentation

## 2023-10-25 NOTE — Therapy (Signed)
 OUTPATIENT OCCUPATIONAL THERAPY ORTHO TREATMENT Patient Name: Wanda Smith MRN: [id number] DOB:05/19/1961, 63 y.o., female  PCP: Merlinda Frederick NP REFERRING PROVIDER: Marney Doctor PA  END OF SESSION:  OT End of Session - 10/04/23 0812     Visit Number 35    Number of Visits 40   Date for OT Re-Evaluation 12/27/23    OT Start Time 0815    Activity Tolerance Patient tolerated treatment well    Behavior During Therapy [facility] for tasks assessed/performed              Past Medical History:  Diagnosis Date   Anemia    Anxiety    Arthritis    knee (R)   Asthma    Carpal tunnel syndrome    Cellulitis of right hand 03/30/2023   Chronic kidney disease, stage 3a (HCC)    Dr Cherylann Ratel   COPD (chronic obstructive pulmonary disease) (HCC)    Depression    Diabetes mellitus without complication (HCC)    Dyspnea    GERD (gastroesophageal reflux disease)    occ   Hyperlipidemia    Hypertension    Hypothyroidism    Increased serum lipids    MRSA infection 03/30/2023   right hand   Osteoporosis    Severe sepsis (HCC) 03/30/2023   Type 2 diabetes mellitus with renal manifestations (HCC)    Wears contact lenses    Past Surgical History:  Procedure Laterality Date   ABDOMINAL HYSTERECTOMY  1998   CARPAL TUNNEL RELEASE Right 07/26/2018   Procedure: CARPAL TUNNEL RELEASE ENDOSCOPIC;  Surgeon: Christena Flake, MD;  Location: [facility];  Service: Orthopedics;  Laterality: Right;  diabetic - oral meds   CHOLECYSTECTOMY  2013   CLOSED REDUCTION METACARPAL WITH PERCUTANEOUS PINNING Right 07/05/2023   Procedure: MANIPULATION UNDER ANESTHESIA OF RIGHT INDEX AND LONG FINGERS WITH POSSIBLE CAPSULAR OF RIGHT INDEX AND LONG MCP JOINTS;  Surgeon: Christena Flake, MD;  Location: [facility];  Service: Orthopedics;  Laterality: Right;   COLONOSCOPY WITH ESOPHAGOGASTRODUODENOSCOPY (EGD)  03/03/2012   COLONOSCOPY WITH ESOPHAGOGASTRODUODENOSCOPY (EGD)  02/23/2022   COLONOSCOPY WITH  ESOPHAGOGASTRODUODENOSCOPY (EGD)  01/16/1991   COLONOSCOPY WITH PROPOFOL N/A 01/13/2018   Procedure: COLONOSCOPY WITH PROPOFOL;  Surgeon: Scot Jun, MD;  Location: [facility];  Service: Endoscopy;  Laterality: N/A;   HERNIA REPAIR  1966   I & D EXTREMITY Right 04/01/2023   Procedure: IRRIGATION AND DEBRIDEMENT EXTREMITY PREVENA PLACEMENT;  Surgeon: Christena Flake, MD;  Location: [facility];  Service: Orthopedics;  Laterality: Right;   I-131 THYROID ABLATION     INCISION AND DRAINAGE ABSCESS Right 03/30/2023   Procedure: INCISION AND DRAINAGE ABSCESS RIGHT HAND;  Surgeon: Christena Flake, MD;  Location: [facility];  Service: Orthopedics;  Laterality: Right;   INCISION AND DRAINAGE OF WOUND Right 04/04/2023   Procedure: IRRIGATION AND DEBRIDEMENT RIGHT HAND DORSAL AND PALMAR INCISIONS WITH DELAYED PRIMARY CLOSURE;  Surgeon: Christena Flake, MD;  Location: [facility];  Service: Orthopedics;  Laterality: Right;   ROTATOR CUFF REPAIR Left 2011   SHOULDER ARTHROSCOPY WITH SUBACROMIAL DECOMPRESSION, ROTATOR CUFF REPAIR AND BICEP TENDON REPAIR Right 10/21/2021   Procedure: SHOULDER ARTHROSCOPY WITH DEBRIDEMENT, DECOMPRESSION, BICEPS TENODESIS.;  Surgeon: Christena Flake, MD;  Location: [facility];  Service: Orthopedics;  Laterality: Right;   TUBAL LIGATION  1993   Patient Active Problem List   Diagnosis Date Noted   MRSA infection 04/04/2023   Abscess of right hand 03/31/2023   Cellulitis of right hand  03/30/2023   Depression with anxiety 03/30/2023   HLD (hyperlipidemia) 03/30/2023   Type II diabetes mellitus with renal manifestations (HCC) 03/30/2023   Chronic kidney disease, stage 3a (HCC) 03/30/2023   Abnormal LFTs 03/30/2023   Severe sepsis (HCC) 03/30/2023   Hypothyroidism    Hypertension    COPD (chronic obstructive pulmonary disease) (HCC)     ONSET DATE: 03/30/23  REFERRING DIAG:Abscess of R hand -3 irrigation surgeries/Capsulectomy 2nd and 3rd MC - release of collateral  ligaments  THERAPY DIAG:  Muscle weakness (generalized)  Stiffness of right hand, not elsewhere classified  Rationale for Evaluation and Treatment: Rehabilitation  SUBJECTIVE:   SUBJECTIVE STATEMENT: I am using the paraffin in the morning before I start working.  And at nighttime.  The knuckle bender splint I use about 3 times during the day.  Doing okay.  This week with a cold weather just stiff again.  But I can do most everything except opening certain containers or jars.  pt accompanied by: self  PERTINENT HISTORY:  Ortho note 04/20/23 Wanda Smith is a 63 y.o. female who presents today for a skin check status post multiple irrigation and debridements involving right dorsal and volar hand wounds and possible suture removal. The patient was admitted to the hospital after suffering a dog scratch which eventually became more swollen and painful. The patient underwent a CT scan of the right hand which demonstrated a abscess formation along the volar aspect of the hand initially. She underwent 1 irrigation debridement however the patient continued to have swelling and pain prompting a repeat CT scan which demonstrated abscess formation along the dorsal aspect of the hand. The patient underwent a second irrigation debridement and a final third procedure performed by Dr. Joice Lofts on 04/04/2023. The patient was evaluated last week where skin inspection demonstrated excellent granulation without any significant erythema or purulent drainage. The patient then had a follow-up with infectious disease where repeat CRP came back at a normal level. The patient does still have a oral prescription for linezolid which she is scheduled to take until end this week. She denies any trauma or injury affecting the right hand since her last evaluation. She does report an occasional burning discomfort in the index finger. She is still taking occasional oxycodone. She reports a 0 out of 10 pain score at today's visit.  Stitches remove- and to finish antibiotics and refer to OT. ON 07/05/2023 - Pt had by Dr Joice Lofts   Attempted closed manipulation under anesthesia,  then had open capsulectomies with release of radial and ulnar collateral ligaments of both index and long finger MCP joints.- refer to OT to start ROM , splinting and tx 3 days later    PRECAUTIONS: None  WEIGHT BEARING RESTRICTIONS: No  PAIN:  Are you having pain?  More stiffness than pain  FALLS: Has patient fallen in last 6 months? No  LIVING ENVIRONMENT: Lives with: lives with their family and lives with their spouse  PLOF: Work on [employer], gym 3 x wk , do own cooking and housework  PATIENT GOALS: Get my motion and strength back in the right hand so that I can carry and grip and hold objects  NEXT MD VISIT: middle Dec OBJECTIVE:   HAND DOMINANCE: Right  UPPER EXTREMITY ROM:     Active ROM Right eval Left eval R  04/29/23 05/03/23 05/16/23 R 9/16/24R 06/02/23 R R 07/08/23 R 07/18/23 R 08/02/23 R 08/09/2023 R 08/25/23 R 09/16/23 R 10/25/23  Shoulder flexion  Shoulder abduction                Shoulder adduction                Shoulder extension                Shoulder internal rotation                Shoulder external rotation                Elbow flexion                Elbow extension                Wrist flexion 35 110 50 50 63 72 80 60 76   70 80 95  Wrist extension 48 85 60 55 65 70 70 50 67 78  78 60 70 70  Wrist ulnar deviation 24      35 30    30  30 30   Wrist radial deviation 20      25 20    22 25 25   Wrist pronation                Wrist supination                (Blank rows = not tested)  Active ROM Right eval Left eval R   04/29/23 R  05/12/23 R  05/16/23 R 05/23/23 R 06/02/23 R 06/09/23 R 07/08/23 R 07/13/23  In session R 07/22/23 R 08/02/23 R 08/09/2023 R 08/25/23  R 09/16/23 Coming in/ after paraffin R 09/22/23 R coming in and after heat and ROM  R 10/03/24 SOC / after paraffin  R 10/25/23 SOC /after paraffin  Thumb MCP (0-60) 70                    Thumb IP (0-80) 35                    Thumb Radial abd/add (0-55) 45    50  50 50              Thumb Palmar abd/add (0-45) 45    50  65 55              Thumb Opposition to Small Finger Opposition to 3rd, 4th and 5th     Opposition to all digits   Opposition to base of 5th               Index MCP (0-90) 30   50 60 60( 65 66 70 70 75 75 75 75 75 80  70/80 75/80 75/80  75/80 ext -30  Index PIP (0-100) 45( ext -30   55 ( ext -20 60 ( ext -10) 60 ( blocked 70) 60 66 70 50 70 70 70 70 80 80  70/75  70 70/80  Index DIP (0-70)       50                Long MCP (0-90)  45   50 60 65 70 70 75 ( 80 in session)  Ext -50 flexion65 80 75 80  80  80 80/85 80/85 85  Long PIP (0-100)  90( ext -20   95 ( ext -15 95 ( ext -5)  100 100 100 90 80 95 100  95  100   100  Long DIP (0-70)  Ring MCP (0-90)  45   60 70 75 75 80 80( 90 in session Ext -45 flexion 70 90 90 85  85  80/85 80/85 85/85  85  Ring PIP (0-100)  100 (ext -10   100( ext 0  100 - full ext  100 100 100 100 95 95 100  100  100   100  Ring DIP (0-70)                       Little MCP (0-90)  75   75 80  80 85 85( 90 in session) 65 90 85 85  85  80/90 85/90 85/90  90  Little PIP (0-100)  90 (ext 0   100 ( ext 0 100- full ext   100 100 100 10 95 100 100  100  100   100  Little DIP (0-70)                       (Blank rows = not tested) I  05/23/23 GRIP R 14 lbs L 55 lbs, lat pinch R 4 lbs L 14 lbs and 3 point pinch R 5 lbs and L 10 lbs   05/30/23: Lat grip R 7 lbs , 14 L - 3 point R 6lbs , L 10 lbs   06/07/23 GRIP R 15 lbs L 55 lbs, lat pinch R 7 lbs L 14 lbs and 3 point pinch R 8 lbs and L 10 lbs 06/21/23 GRIP R 12 lbs L 48 lbs, lat pinch R 5 lbs L 14 lbs and 3 point pinch R 6 lbs and L 10 lbs 06/25/23 GRIP R 15 lbs L 48 lbs, lat pinch R 6lbs L 14 lbs and 3 point pinch R 8 lbs and L 10 lbs 08/02/23 GRIP R 19 lbs  08/16/23 Grip R 25 lbs, 48 lbs, L, Lat pinch R 7 lbs  and L 14 lbs  08/25/23 Grip R 26 lbs, 48 lbs, L, Lat pinch R 8 lbs and L 14 lbs, 3 point R 8 lb 10/04/23 GRIP R 30  lbs L 55 lbs, lat pinch R 9 lbs L 14 lbs and 3 point pinch R 10 lbs and L 12 lbs 10/25/23 GRIP R 34  lbs L 55 lbs, lat pinch R 9 lbs L 14 lbs and 3 point pinch R 10 lbs and L 12 lbs   COORDINATION: Decreasing significantly because of stiffness and scar tissue in the second digit  EDEMA: R hand   COGNITION: Overall cognitive status: Within functional limits for tasks assessed  TODAY'S TREATMENT:                                                                                                                              DATE:  10/25/23  Therapeutic Exercises: Patient arrived after doing home program for about 3 weeks.  She reported using her paraffin in the morning in the  evening.  Knuckle Bender splint for MC flexion in combination with passive range of motion about 3 times during the day while working on the computer.   Cont to have decrease edema and pain at 2nd volar MC  Grip increase to 34 lbs  Patient show progress in 3rd-5th digit flexion.  Was able to maintain second digit flexion.  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   Done paraffin with Coban flexion wrap to second and third digits for 8 minutes  - patient do have a paraffin at home -  Followed by some soft tissue mobilization using Graston tool for brushing over volar second digit and palm.  As well as joint mobs to second digit with light traction.  Prior to passive range of motion for second MC and PIP flexion with extension in between  after which she did increase by 5 to 10 degrees second digit Followed by placing hold in composite flexion.  Reviewed with her doing Green putty for gripping flexion  composite.  Needs verbal cueing for fifth and second digits  Also remind her to do  lat pinch -she was doing 3 point pinch  2 sets of 12-15 reps- pain free ,      PATIENT EDUCATION: Education details: Findings of evaluation and home program Person educated: Patient Education method: Explanation, Demonstration, Tactile cues, Verbal cues, and Handouts Education comprehension: verbalized understanding, returned demonstration, verbal cues required, tactile cues required, and needs further education   GOALS: Goals reviewed with patient? Yes  SHORT TERM GOALS: Target date: 4 wks  Patient to be independent in home program to decrease scar tissue and edema - increased extension and flexion of digits as well as wrist Baseline: Severe stiffness in digits, i 3 days ago surgery stitches in place  this date- Goal status: MET   LONG TERM GOALS: Target date: 8 wks Flexion of right digits improved for patient to touch palm to initiate use in ADLs with no increase symptoms Baseline: Surgery done 3 days ago -capsulectomy -MC flexion 65 to 70 degrees coming in.  PIP 52 degrees.   NOW 3rd thru 5th touching palm Goal status: progressing    2.  Patient increase extension of right digits 2nd thru 4th to be able to don gloves, wash face and reach aroundcylinder objects without issues Baseline: 3 days ago patient had capsulectomy.  Extension of metacarpals -50-second and -40 degrees at third NOW improved greatly -still not able to lay hand flat or do wall pushup Goal status: MET   3.  Scar tissue improved for patient to be able to make composite fist as well as increase extension to don gloves and put hand in pocket Baseline:   Stitches in place.  Decreased flexion and extension with increased edema - NOW 2nd MC ext -30 and 75-85 MC flexion- 70- at 2nd PIP goal status:progressing    4.  Right grip and prehension strength increased to more than 75% compared to the left for patient to hold weight to work  out and cook Baseline: 3 days postop from capsulectomy with ligament release on second and third metacarpal NOW grip 30 lbs, lat 9lbs and 3 point 10 lbs  Goal status: progressing   5.  R wrist AROM increase to WNL to  push and pull door open without increase symptoms  Baseline:  wrist flxion 60, ext 50 Goal status:MET  ASSESSMENT:  CLINICAL IMPRESSION: Patient being treated by OT for postop capsulectomy of right second and third metacarpal with release of collateral ligaments. Patient had earlier 2024 multiple irrigation and debridements involving right dorsal and volar hand wounds -with abscess and infection.  Patient was seen by therapy in September.  Pt remains limited by scar tissue and joint capsule tightness.  Patient is right-hand dominant.  Patient had a  harder time maintaining her range of motion going into the winter months in the colder weather.  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  Patient return after doing her home exercises for 3 weeks focusing on flexion 3 times a day to maintain her progress.  Patient using her knuckle Bender after the paraffin bath 2 x 5 minutes-tension to 4 and 6 rubber bands again -then followed by placing hold and  green putty.  Patient made progress in grip strength as well as flexion of 3rd through 5th digits.  Was able to maintain second digit.  Patient to continue with home program for about 4 to 5 weeks and follow-up 1 more time prior to next visit with surgeon.  To monitor if cont to progress and maintain ROM in R dominant hand.  Patient continues benefit from skilled OT services  decrease  scar tissue and increase range of motion in flexion and extension of digits as well as wrist, increase strength to return to prior level of function.  PERFORMANCE DEFICITS: in functional skills including ADLs, IADLs, coordination, dexterity, edema, ROM, strength, pain, flexibility, and UE functional use,   and psychosocial  skills including habits and routines and behaviors.   IMPAIRMENTS: are limiting patient from ADLs, IADLs, rest and sleep, work, play, leisure, and social participation.   COMORBIDITIES: has no other co-morbidities that affects occupational performance. Patient will benefit from skilled OT to address above impairments and improve overall function.  MODIFICATION OR ASSISTANCE TO COMPLETE EVALUATION: No modification of tasks or assist necessary to complete an evaluation.  OT OCCUPATIONAL PROFILE AND HISTORY: Problem focused assessment: Including review of records relating to presenting problem.  CLINICAL DECISION MAKING: LOW - limited treatment options, no task modification necessary  REHAB POTENTIAL: Good  EVALUATION COMPLEXITY: Low  PLAN:  OT FREQUENCY: 3-5 visits  OT DURATION: 12 wks   PLANNED INTERVENTIONS: ADL's therapeutic exercise, therapeutic activity, manual therapy, scar mobilization, passive range of motion, splinting, paraffin, fluidotherapy, contrast bath, patient/family education, and DME and/or AE instructions  CONSULTED AND AGREED WITH PLAN OF CARE: Patient   Oletta Cohn, OTR/L,CLT 10/25/2023, 9:55 AM

## 2023-11-11 IMAGING — MG DIGITAL DIAGNOSTIC BILAT W/ TOMO W/ CAD
6 of 10 series · 6 of 30 positions shown · non-contrast
Comparison: Previous exam(s).

CLINICAL DATA: Follow-up probably benign right breast mass. The
patient had recent right shoulder surgery can not raise her right
arm.

EXAM:
DIGITAL DIAGNOSTIC BILATERAL MAMMOGRAM WITH TOMOSYNTHESIS AND CAD;
ULTRASOUND RIGHT BREAST LIMITED
TECHNIQUE: Bilateral digital diagnostic mammography and breast tomosynthesis
was performed. The images were evaluated with computer-aided
detection.; Targeted ultrasound examination of the right breast was
performed

[L CC synth-2D]
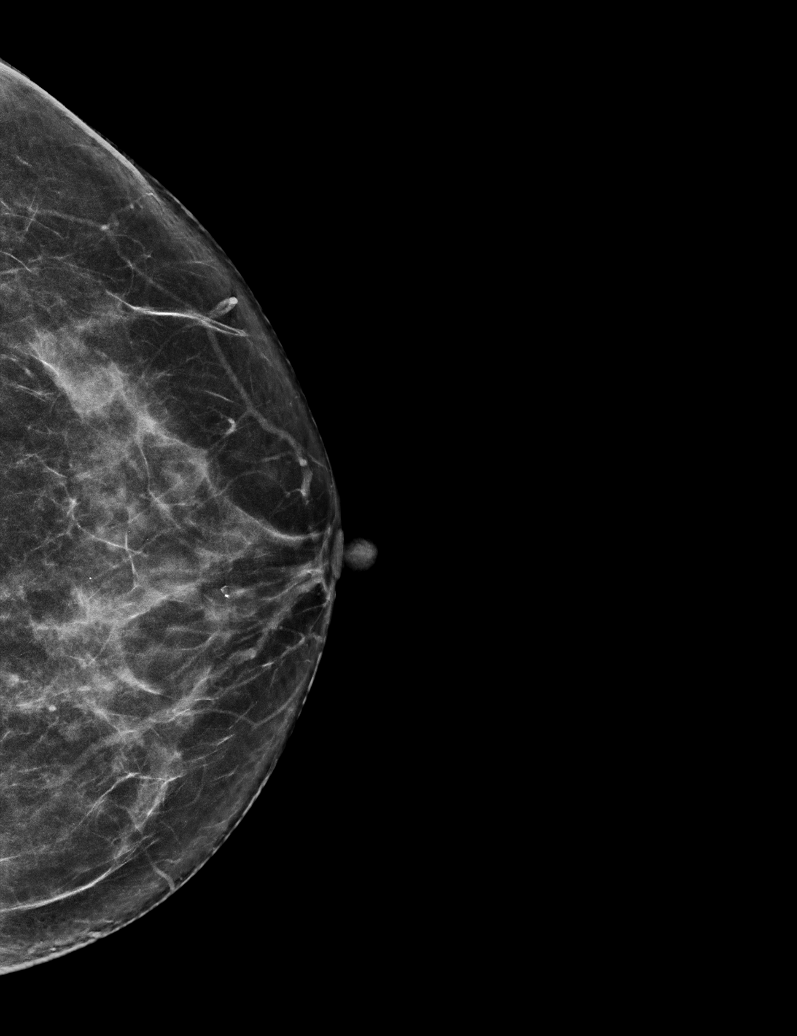

[L MLO synth-2D]
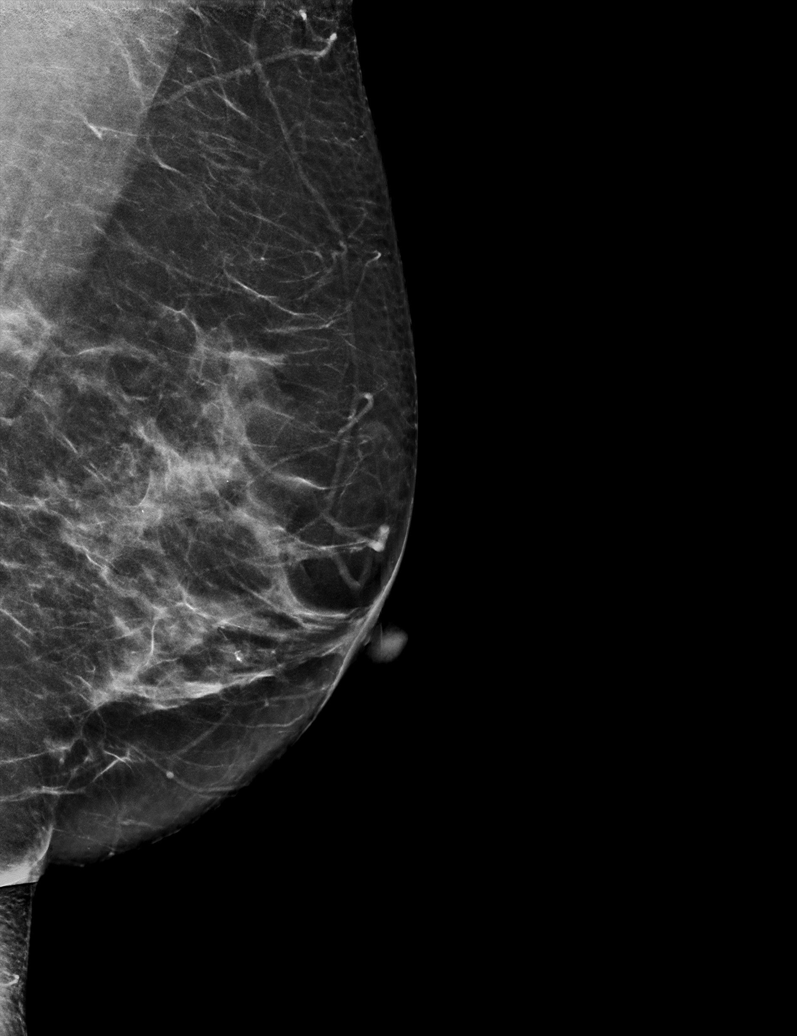

[R MLO synth-2D]
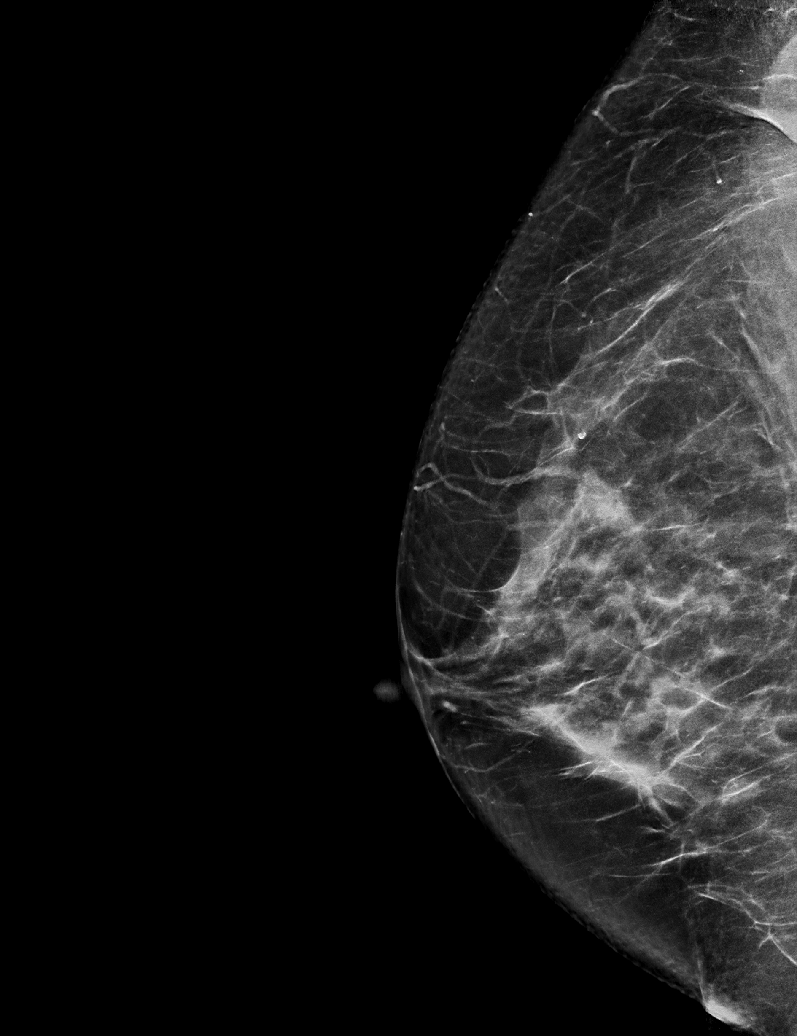

[R XCCL synth-2D]
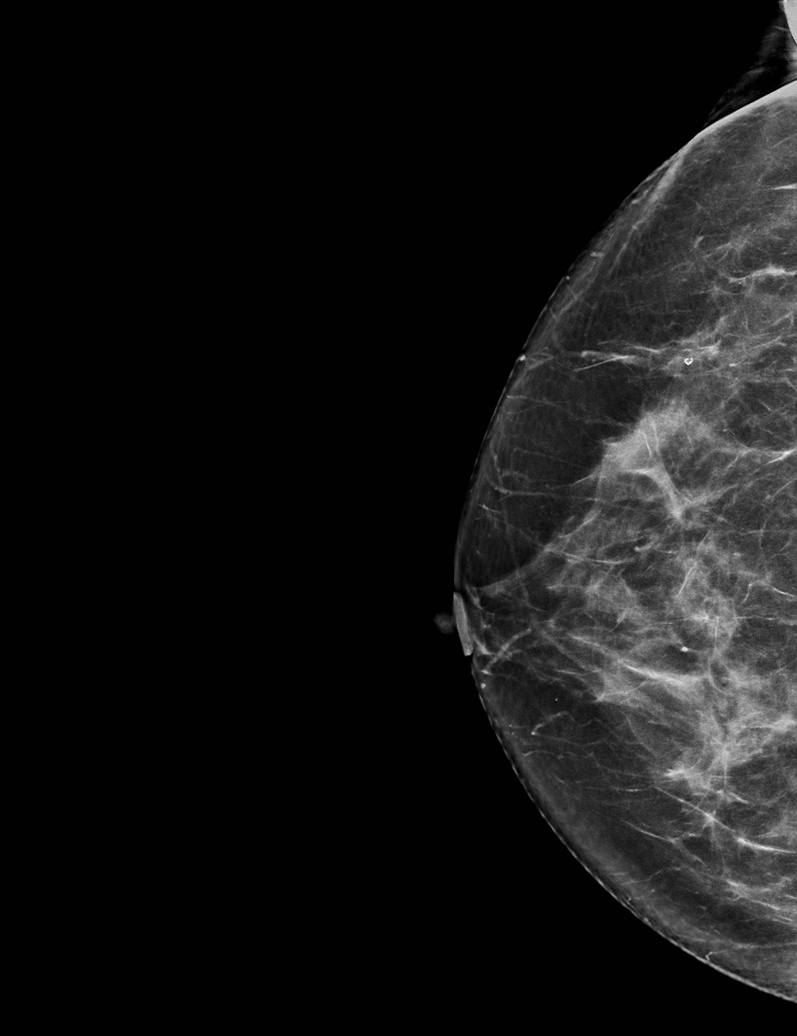

[R CC synth-2D]
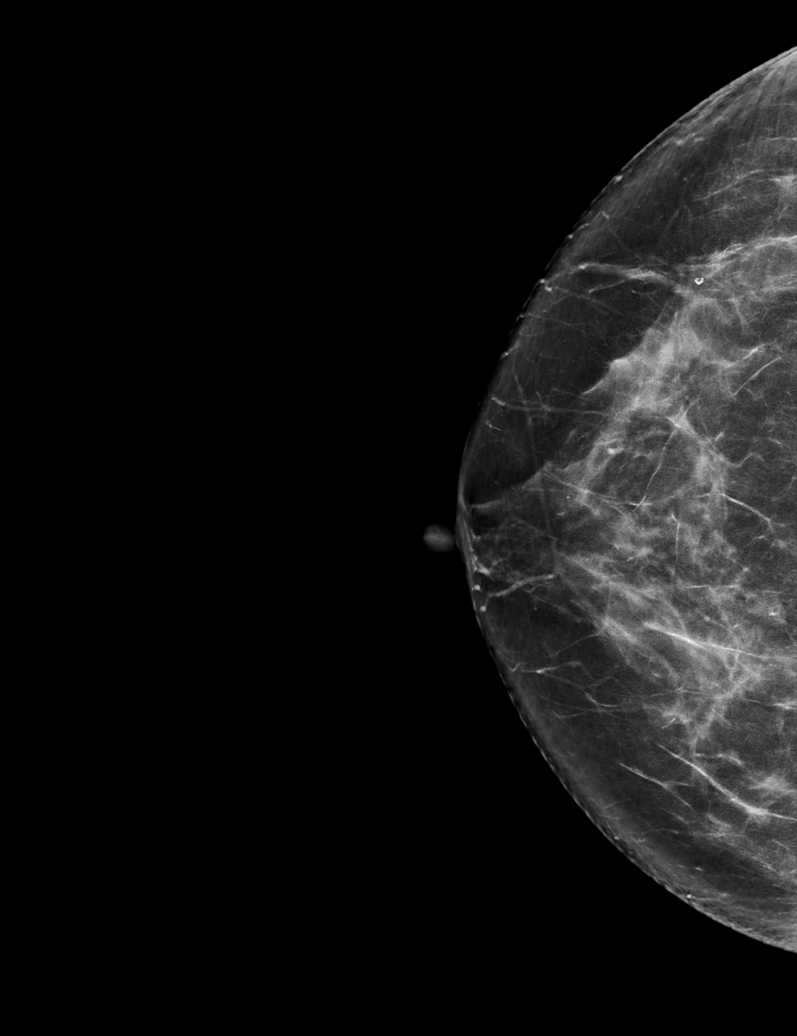

[L CC tomo · tomo slice 31/62.0]
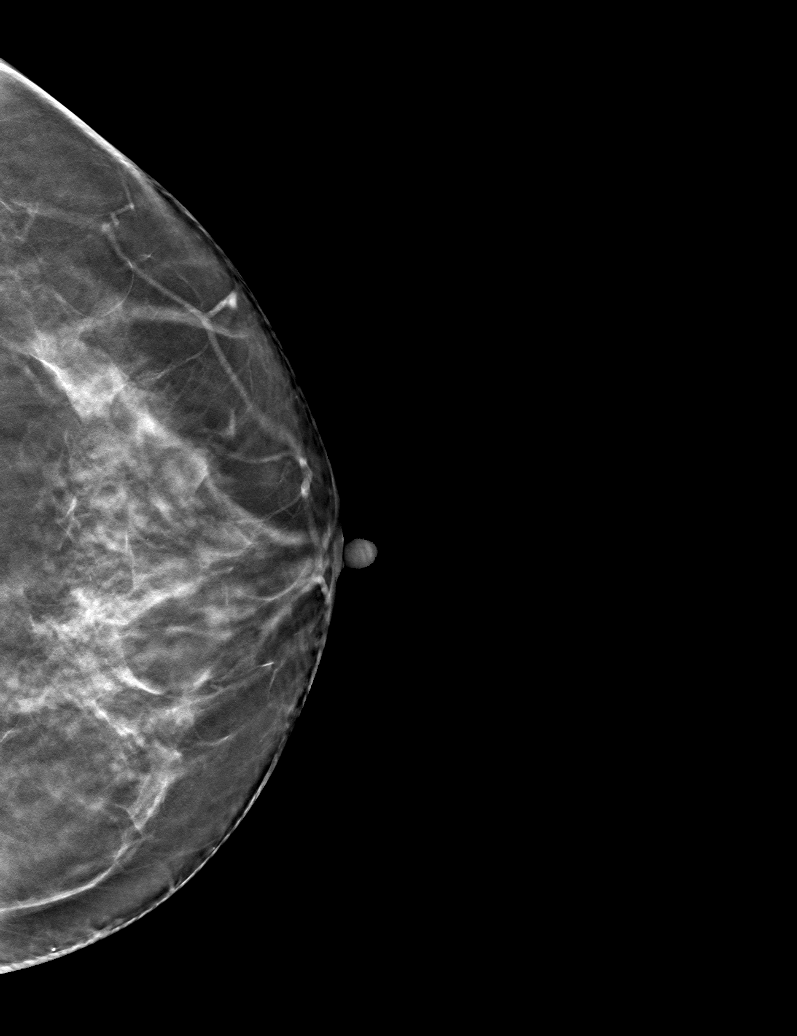

[6 of 30 positions shown; findings below may reference images not displayed]

ACR Breast Density Category c: The breast tissue is heterogeneously
dense, which may obscure small masses.
FINDINGS: An oval, circumscribed mass in the posteromedial right breast is
minimally larger than on previous examinations and remains
circumscribed. No findings elsewhere in either breast suspicious for
malignancy.

Targeted ultrasound is performed, showing a 1.0 x 0.6 x 0.4 cm oval
cyst with thin internal septations in the 4 o'clock position of the
right breast, 2 cm from the nipple. This measured 0.9 x 0.6 x 0.2 cm
on 10/31/2020 and 0.8 x 0.5 x 0.2 cm on 11/09/2019.
IMPRESSION: 1. 1.0 cm benign cyst in the 4 o'clock position of the right breast.
This does not need further follow-up.
2. No evidence of malignancy in either breast.

RECOMMENDATION:
Bilateral screening mammogram in 1 year.

I have discussed the findings and recommendations with the patient.
If applicable, a reminder letter will be sent to the patient
regarding the next appointment.

BI-RADS CATEGORY  2: Benign.

## 2023-11-15 ENCOUNTER — Encounter: Payer: No Typology Code available for payment source | Admitting: Occupational Therapy

## 2023-11-22 ENCOUNTER — Encounter: Payer: No Typology Code available for payment source | Admitting: Occupational Therapy

## 2023-11-24 ENCOUNTER — Ambulatory Visit
Admission: RE | Admit: 2023-11-24 | Discharge: 2023-11-24 | Disposition: A | Discharge status: Home / Self Care | Payer: No Typology Code available for payment source | Source: Ambulatory Visit | Attending: Physician Assistant | Admitting: Physician Assistant

## 2023-11-24 DIAGNOSIS — Z1231 Encounter for screening mammogram for malignant neoplasm of breast: Secondary | ICD-10-CM | POA: Diagnosis present

## 2023-11-29 ENCOUNTER — Encounter: Payer: No Typology Code available for payment source | Admitting: Occupational Therapy

## 2023-12-01 ENCOUNTER — Ambulatory Visit: Payer: No Typology Code available for payment source | Attending: Student | Admitting: Occupational Therapy

## 2023-12-01 DIAGNOSIS — M25631 Stiffness of right wrist, not elsewhere classified: Secondary | ICD-10-CM | POA: Diagnosis present

## 2023-12-01 DIAGNOSIS — M25641 Stiffness of right hand, not elsewhere classified: Secondary | ICD-10-CM | POA: Diagnosis present

## 2023-12-01 DIAGNOSIS — L905 Scar conditions and fibrosis of skin: Secondary | ICD-10-CM | POA: Insufficient documentation

## 2023-12-01 NOTE — Therapy (Signed)
 OUTPATIENT OCCUPATIONAL THERAPY ORTHO TREATMENT Patient Name: Wanda Smith MRN: [id number] DOB:May 17, 1961, 63 y.o., female  PCP: Merlinda Frederick NP REFERRING PROVIDER: Marney Doctor PA  END OF SESSION:  OT End of Session - 10/04/23 0812     Visit Number 35    Number of Visits 40   Date for OT Re-Evaluation 12/27/23    OT Start Time 0815    Activity Tolerance Patient tolerated treatment well    Behavior During Therapy [facility] for tasks assessed/performed              Past Medical History:  Diagnosis Date   Anemia    Anxiety    Arthritis    knee (R)   Asthma    Carpal tunnel syndrome    Cellulitis of right hand 03/30/2023   Chronic kidney disease, stage 3a (HCC)    Dr Cherylann Ratel   COPD (chronic obstructive pulmonary disease) (HCC)    Depression    Diabetes mellitus without complication (HCC)    Dyspnea    GERD (gastroesophageal reflux disease)    occ   Hyperlipidemia    Hypertension    Hypothyroidism    Increased serum lipids    MRSA infection 03/30/2023   right hand   Osteoporosis    Severe sepsis (HCC) 03/30/2023   Type 2 diabetes mellitus with renal manifestations (HCC)    Wears contact lenses    Past Surgical History:  Procedure Laterality Date   ABDOMINAL HYSTERECTOMY  1998   CARPAL TUNNEL RELEASE Right 07/26/2018   Procedure: CARPAL TUNNEL RELEASE ENDOSCOPIC;  Surgeon: Christena Flake, MD;  Location: [facility];  Service: Orthopedics;  Laterality: Right;  diabetic - oral meds   CHOLECYSTECTOMY  2013   CLOSED REDUCTION METACARPAL WITH PERCUTANEOUS PINNING Right 07/05/2023   Procedure: MANIPULATION UNDER ANESTHESIA OF RIGHT INDEX AND LONG FINGERS WITH POSSIBLE CAPSULAR OF RIGHT INDEX AND LONG MCP JOINTS;  Surgeon: Christena Flake, MD;  Location: [facility];  Service: Orthopedics;  Laterality: Right;   COLONOSCOPY WITH ESOPHAGOGASTRODUODENOSCOPY (EGD)  03/03/2012   COLONOSCOPY WITH ESOPHAGOGASTRODUODENOSCOPY (EGD)  02/23/2022   COLONOSCOPY WITH  ESOPHAGOGASTRODUODENOSCOPY (EGD)  01/16/1991   COLONOSCOPY WITH PROPOFOL N/A 01/13/2018   Procedure: COLONOSCOPY WITH PROPOFOL;  Surgeon: Scot Jun, MD;  Location: [facility];  Service: Endoscopy;  Laterality: N/A;   HERNIA REPAIR  1966   I & D EXTREMITY Right 04/01/2023   Procedure: IRRIGATION AND DEBRIDEMENT EXTREMITY PREVENA PLACEMENT;  Surgeon: Christena Flake, MD;  Location: [facility];  Service: Orthopedics;  Laterality: Right;   I-131 THYROID ABLATION     INCISION AND DRAINAGE ABSCESS Right 03/30/2023   Procedure: INCISION AND DRAINAGE ABSCESS RIGHT HAND;  Surgeon: Christena Flake, MD;  Location: [facility];  Service: Orthopedics;  Laterality: Right;   INCISION AND DRAINAGE OF WOUND Right 04/04/2023   Procedure: IRRIGATION AND DEBRIDEMENT RIGHT HAND DORSAL AND PALMAR INCISIONS WITH DELAYED PRIMARY CLOSURE;  Surgeon: Christena Flake, MD;  Location: [facility];  Service: Orthopedics;  Laterality: Right;   ROTATOR CUFF REPAIR Left 2011   SHOULDER ARTHROSCOPY WITH SUBACROMIAL DECOMPRESSION, ROTATOR CUFF REPAIR AND BICEP TENDON REPAIR Right 10/21/2021   Procedure: SHOULDER ARTHROSCOPY WITH DEBRIDEMENT, DECOMPRESSION, BICEPS TENODESIS.;  Surgeon: Christena Flake, MD;  Location: [facility];  Service: Orthopedics;  Laterality: Right;   TUBAL LIGATION  1993   Patient Active Problem List   Diagnosis Date Noted   MRSA infection 04/04/2023   Abscess of right hand 03/31/2023   Cellulitis of right hand  03/30/2023   Depression with anxiety 03/30/2023   HLD (hyperlipidemia) 03/30/2023   Type II diabetes mellitus with renal manifestations (HCC) 03/30/2023   Chronic kidney disease, stage 3a (HCC) 03/30/2023   Abnormal LFTs 03/30/2023   Severe sepsis (HCC) 03/30/2023   Hypothyroidism    Hypertension    COPD (chronic obstructive pulmonary disease) (HCC)     ONSET DATE: 03/30/23  REFERRING DIAG:Abscess of R hand -3 irrigation surgeries/Capsulectomy 2nd and 3rd MC - release of collateral  ligaments  THERAPY DIAG:  Stiffness of right wrist, not elsewhere classified  Stiffness of right hand, not elsewhere classified  Scar condition and fibrosis of skin  Rationale for Evaluation and Treatment: Rehabilitation  SUBJECTIVE:   SUBJECTIVE STATEMENT: I am doing okay.  Started Pilates class and some weights.  I can still have some increased swelling after that but I use my Isotoner glove.  Will stiff in the morning but better than in the winter -I am doing the paraffin still each morning and the knuckle bender splint at least once a day. pt accompanied by: self  PERTINENT HISTORY:  Ortho note 04/20/23 [name] is a 63 y.o. female who presents today for a skin check status post multiple irrigation and debridements involving right dorsal and volar hand wounds and possible suture removal. The patient was admitted to the hospital after suffering a dog scratch which eventually became more swollen and painful. The patient underwent a CT scan of the right hand which demonstrated a abscess formation along the volar aspect of the hand initially. She underwent 1 irrigation debridement however the patient continued to have swelling and pain prompting a repeat CT scan which demonstrated abscess formation along the dorsal aspect of the hand. The patient underwent a second irrigation debridement and a final third procedure performed by Dr. Joice Lofts on 04/04/2023. The patient was evaluated last week where skin inspection demonstrated excellent granulation without any significant erythema or purulent drainage. The patient then had a follow-up with infectious disease where repeat CRP came back at a normal level. The patient does still have a oral prescription for linezolid which she is scheduled to take until end this week. She denies any trauma or injury affecting the right hand since her last evaluation. She does report an occasional burning discomfort in the index finger. She is still taking  occasional oxycodone. She reports a 0 out of 10 pain score at today's visit. Stitches remove- and to finish antibiotics and refer to OT. ON 07/05/2023 - Pt had by Dr Joice Lofts   Attempted closed manipulation under anesthesia,  then had open capsulectomies with release of radial and ulnar collateral ligaments of both index and long finger MCP joints.- refer to OT to start ROM , splinting and tx 3 days later    PRECAUTIONS: None  WEIGHT BEARING RESTRICTIONS: No  PAIN:  Are you having pain?  More stiffness than pain  FALLS: Has patient fallen in last 6 months? No  LIVING ENVIRONMENT: Lives with: lives with their family and lives with their spouse  PLOF: Work on [employer], gym 3 x wk , do own cooking and housework  PATIENT GOALS: Get my motion and strength back in the right hand so that I can carry and grip and hold objects  NEXT MD VISIT: middle Dec OBJECTIVE:   HAND DOMINANCE: Right  UPPER EXTREMITY ROM:     Active ROM Right eval Left eval R  04/29/23 05/03/23 05/16/23 R 9/16/24R 06/02/23 R R 07/08/23 R 07/18/23 R 08/02/23 R  08/09/2023 R 08/25/23 R 09/16/23 R 10/25/23 R 12/01/23  Shoulder flexion                 Shoulder abduction                 Shoulder adduction                 Shoulder extension                 Shoulder internal rotation                 Shoulder external rotation                 Elbow flexion                 Elbow extension                 Wrist flexion 35 110 50 50 63 72 80 60 76   70 80 95 95  Wrist extension 48 85 60 55 65 70 70 50 67 78  78 60 70 70 70  Wrist ulnar deviation 24      35 30    30  30 30 30   Wrist radial deviation 20      25 20    22 25 25 20   Wrist pronation                 Wrist supination                 (Blank rows = not tested)  Active ROM Right eval Left eval R   04/29/23 R  05/12/23 R  05/16/23 R 05/23/23 R 06/02/23 R 06/09/23 R 07/08/23 R 07/13/23  In session R 07/22/23 R 08/02/23 R 08/09/2023 R 08/25/23  R 09/16/23 Coming  in/ after paraffin R 09/22/23 R coming in and after heat and ROM  R 10/03/24 SOC / after paraffin R 10/25/23 SOC /after paraffin R 12/01/23  Thumb MCP (0-60) 70                     Thumb IP (0-80) 35                     Thumb Radial abd/add (0-55) 45    50  50 50               Thumb Palmar abd/add (0-45) 45    50  65 55               Thumb Opposition to Small Finger Opposition to 3rd, 4th and 5th     Opposition to all digits   Opposition to base of 5th                Index MCP (0-90) 30   50 60 60( 65 66 70 70 75 75 75 75 75 80  70/80 75/80 75/80  75/80 ext -30 75  Index PIP (0-100) 45( ext -30   55 ( ext -20 60 ( ext -10) 60 ( blocked 70) 60 66 70 50 70 70 70 70 80 80  70/75  70 70/80 70  Index DIP (0-70)       50                 Long MCP (0-90)  45   50 60 65 70 70 75 ( 80 in session)  Ext -50 flexion65 80 75 80  80  80 80/85 80/85 85 85  Long PIP (0-100)  90( ext -20   95 ( ext -15 95 ( ext -5)  100 100 100 90 80 95 100  95  100   100 100  Long DIP (0-70)                        Ring MCP (0-90)  45   60 70 75 75 80 80( 90 in session Ext -45 flexion 70 90 90 85  85  80/85 80/85 85/85  85 90  Ring PIP (0-100)  100 (ext -10   100( ext 0  100 - full ext  100 100 100 100 95 95 100  100  100   100 100  Ring DIP (0-70)                        Little MCP (0-90)  75   75 80  80 85 85( 90 in session) 65 90 85 85  85  80/90 85/90 85/90  90 90  Little PIP (0-100)  90 (ext 0   100 ( ext 0 100- full ext   100 100 100 10 95 100 100  100  100   100 100  Little DIP (0-70)                        (Blank rows = not tested) I  05/23/23 GRIP R 14 lbs L 55 lbs, lat pinch R 4 lbs L 14 lbs and 3 point pinch R 5 lbs and L 10 lbs   05/30/23: Lat grip R 7 lbs , 14 L - 3 point R 6lbs , L 10 lbs   06/07/23 GRIP R 15 lbs L 55 lbs, lat pinch R 7 lbs L 14 lbs and 3 point pinch R 8 lbs and L 10 lbs 06/21/23 GRIP R 12 lbs L 48 lbs, lat pinch R 5 lbs L 14 lbs and 3 point pinch R 6 lbs and L 10 lbs 06/25/23 GRIP R 15 lbs L 48  lbs, lat pinch R 6lbs L 14 lbs and 3 point pinch R 8 lbs and L 10 lbs 08/02/23 GRIP R 19 lbs  08/16/23 Grip R 25 lbs, 48 lbs, L, Lat pinch R 7 lbs and L 14 lbs  08/25/23 Grip R 26 lbs, 48 lbs, L, Lat pinch R 8 lbs and L 14 lbs, 3 point R 8 lb 10/04/23 GRIP R 30  lbs L 55 lbs, lat pinch R 9 lbs L 14 lbs and 3 point pinch R 10 lbs and L 12 lbs 10/25/23 GRIP R 34  lbs L 55 lbs, lat pinch R 9 lbs L 14 lbs and 3 point pinch R 10 lbs and L 12 lbs  12/01/23 GRIP R 41 lbs L 55 lbs, lat pinch R 11 lbs L 14 lbs and 3 point pinch R 10 lbs and L 12 lbs   COORDINATION: Decreasing significantly because of stiffness and scar tissue in the second digit  EDEMA: R hand   COGNITION: Overall cognitive status: Within functional limits for tasks assessed  TODAY'S TREATMENT:  DATE:  12/01/23  I started doing Pilates classes 3 times a week and some weights.  Do have low blood swelling afterwards.  Been using my glove more.  Otherwise functionally doing well  Therapeutic Exercises: Patient follow-up 6 weeks doing home program Active range of motion in digits same than 6 weeks ago able to maintain with home program Wrist flexion extension able to maintain. Recommend patient to continue to work on composite wrist flexion and extension daily to maintain motion  Grip and prehension strength improve see flowsheet she reported using her paraffin once a day Knuckle Bender splint for MC flexion in combination with passive range of motion use did once a day.  With the same amount of rubber bands     Recommend for patient to do putty days that she is not doing Pilates like maybe 3 times a week.  But focus more on maintaining motion    PATIENT EDUCATION: Education details: Findings of evaluation and home program Person educated: Patient Education method: Explanation, Demonstration, Tactile  cues, Verbal cues, and Handouts Education comprehension: verbalized understanding, returned demonstration, verbal cues required, tactile cues required, and needs further education   GOALS: Goals reviewed with patient? Yes  SHORT TERM GOALS: Target date: 4 wks  Patient to be independent in home program to decrease scar tissue and edema - increased extension and flexion of digits as well as wrist Baseline: Severe stiffness in digits, i 3 days ago surgery stitches in place  this date- Goal status: MET   LONG TERM GOALS: Target date: 8 wks Flexion of right digits improved for patient to touch palm to initiate use in ADLs with no increase symptoms Baseline: Surgery done 3 days ago -capsulectomy -MC flexion 65 to 70 degrees coming in.  PIP 52 degrees.   NOW 3rd thru 5th touching palm Goal status: progressing    2.  Patient increase extension of right digits 2nd thru 4th to be able to don gloves, wash face and reach aroundcylinder objects without issues Baseline: 3 days ago patient had capsulectomy.  Extension of metacarpals -50-second and -40 degrees at third NOW improved greatly -still not able to lay hand flat or do wall pushup Goal status: MET   3.  Scar tissue improved for patient to be able to make composite fist as well as increase extension to don gloves and put hand in pocket Baseline:   Stitches in place.  Decreased flexion and extension with increased edema - NOW 2nd MC ext -30 and 75-85 MC flexion- 70- at 2nd PIP goal status:progressing    4.  Right grip and prehension strength increased to more than 75% compared to the left for patient to hold weight to work out and cook Baseline: 3 days postop from capsulectomy with ligament release on second and third metacarpal NOW grip 30 lbs, lat 9lbs and 3 point 10 lbs  Goal status: Met   5.  R wrist AROM increase to WNL to push and pull door open without increase symptoms  Baseline:  wrist flxion 60, ext 50 Goal  status:MET  ASSESSMENT:  CLINICAL IMPRESSION: Patient being treated by OT for postop capsulectomy of right second and third metacarpal with release of collateral ligaments. Patient had earlier 2024 multiple irrigation and debridements involving right dorsal and volar hand wounds -with abscess and infection.  Patient is 8 months post original injury.  Patient return after doing home program for 6 weeks.  Patient able to maintain and improve digit range of motion.  As well  as wrist flexion extension.  Patient to continue to work on composite wrist flexion and extension to maintain motion.  Strength in wrist and forearm 5/5.  Grip and prehension strength improved.  Patient to continue with putty 3 times a week.  The days that she not doing Pilates.  Patient transition to community exercises Pilates class and weights.  Doing really well.  Patient can continue at home and can be discharged at this time.  PERFORMANCE DEFICITS: in functional skills including ADLs, IADLs, coordination, dexterity, edema, ROM, strength, pain, flexibility, and UE functional use,   and psychosocial skills including habits and routines and behaviors.   IMPAIRMENTS: are limiting patient from ADLs, IADLs, rest and sleep, work, play, leisure, and social participation.   COMORBIDITIES: has no other co-morbidities that affects occupational performance. Patient will benefit from skilled OT to address above impairments and improve overall function.  MODIFICATION OR ASSISTANCE TO COMPLETE EVALUATION: No modification of tasks or assist necessary to complete an evaluation.  OT OCCUPATIONAL PROFILE AND HISTORY: Problem focused assessment: Including review of records relating to presenting problem.  CLINICAL DECISION MAKING: LOW - limited treatment options, no task modification necessary  REHAB POTENTIAL: Good  EVALUATION COMPLEXITY: Low  PLAN:  OT FREQUENCY: 3-5 visits  OT DURATION: 12 wks   PLANNED INTERVENTIONS: ADL's  therapeutic exercise, therapeutic activity, manual therapy, scar mobilization, passive range of motion, splinting, paraffin, fluidotherapy, contrast bath, patient/family education, and DME and/or AE instructions  CONSULTED AND AGREED WITH PLAN OF CARE: Patient   Oletta Cohn, OTR/L,CLT 12/01/2023, 8:47 AM

## 2024-09-23 ENCOUNTER — Encounter: Payer: Self-pay | Admitting: Emergency Medicine

## 2024-09-23 ENCOUNTER — Ambulatory Visit
Admission: EM | Admit: 2024-09-23 | Discharge: 2024-09-23 | Disposition: A | Discharge status: Home / Self Care | Attending: Physician Assistant | Admitting: Physician Assistant

## 2024-09-23 DIAGNOSIS — B349 Viral infection, unspecified: Secondary | ICD-10-CM

## 2024-09-23 DIAGNOSIS — R509 Fever, unspecified: Secondary | ICD-10-CM | POA: Diagnosis not present

## 2024-09-23 DIAGNOSIS — J441 Chronic obstructive pulmonary disease with (acute) exacerbation: Secondary | ICD-10-CM

## 2024-09-23 DIAGNOSIS — J029 Acute pharyngitis, unspecified: Secondary | ICD-10-CM | POA: Diagnosis not present

## 2024-09-23 DIAGNOSIS — R058 Other specified cough: Secondary | ICD-10-CM

## 2024-09-23 LAB — POC COVID19/FLU A&B COMBO
Covid Antigen, POC: NEGATIVE
Influenza A Antigen, POC: NEGATIVE
Influenza B Antigen, POC: NEGATIVE

## 2024-09-23 LAB — POCT RAPID STREP A (OFFICE): Rapid Strep A Screen: NEGATIVE

## 2024-09-23 MED ORDER — PROMETHAZINE-DM 6.25-15 MG/5ML PO SYRP: 5.0000 mL | ORAL_SOLUTION | Freq: Four times a day (QID) | ORAL | 0 refills | Status: AC | PRN

## 2024-09-23 MED ORDER — PREDNISONE 20 MG PO TABS: 40.0000 mg | ORAL_TABLET | Freq: Every day | ORAL | 0 refills | Status: AC

## 2024-09-23 MED ORDER — DOXYCYCLINE HYCLATE 100 MG PO CAPS: 100.0000 mg | ORAL_CAPSULE | Freq: Two times a day (BID) | ORAL | 0 refills | Status: AC

## 2024-09-23 NOTE — Discharge Instructions (Signed)
-  Negative strep, COVID and flu -You likely have a virus and flare up of COPD. I sent antibiotics, prednisone  and cough medicine. Use inhaler if needed. -If not breaking fever in 2-3 days, worsening cough or increased shortness of breath return or go to ER.

## 2024-09-23 NOTE — ED Provider Notes (Signed)
 " [facility]    CSN: [id number] Arrival date & time: 09/23/24  0906     History   Chief Complaint Chief Complaint  Patient presents with   Cough    HPI Wanda Smith is a 64 y.o. female. Patient presents for 2 day history of productive cough, sore throat, body aches, fever with temps up to 101 degrees, chills and sweats. Also reports feeling more SOB than usual and needing to use albuterol . Historyof COPD/asthma. No sick contacts. She has tried OTC meds w/o relief.  HPI  Past Medical History:  Diagnosis Date   Anemia    Anxiety    Arthritis    knee (R)   Asthma    Carpal tunnel syndrome    Cellulitis of right hand 03/30/2023   Chronic kidney disease, stage 3a (HCC)    Dr Marcelino   COPD (chronic obstructive pulmonary disease) (HCC)    Depression    Diabetes mellitus without complication (HCC)    Dyspnea    GERD (gastroesophageal reflux disease)    occ   Hyperlipidemia    Hypertension    Hypothyroidism    Increased serum lipids    MRSA infection 03/30/2023   right hand   Osteoporosis    Severe sepsis (HCC) 03/30/2023   Type 2 diabetes mellitus with renal manifestations (HCC)    Wears contact lenses     Patient Active Problem List   Diagnosis Date Noted   MRSA infection 04/04/2023   Abscess of right hand 03/31/2023   Cellulitis of right hand 03/30/2023   Depression with anxiety 03/30/2023   HLD (hyperlipidemia) 03/30/2023   Type II diabetes mellitus with renal manifestations (HCC) 03/30/2023   Chronic kidney disease, stage 3a (HCC) 03/30/2023   Abnormal LFTs 03/30/2023   Severe sepsis (HCC) 03/30/2023   Hypothyroidism    Hypertension    COPD (chronic obstructive pulmonary disease) (HCC)     Past Surgical History:  Procedure Laterality Date   ABDOMINAL HYSTERECTOMY  1998   CARPAL TUNNEL RELEASE Right 07/26/2018   Procedure: CARPAL TUNNEL RELEASE ENDOSCOPIC;  Surgeon: Edie Norleen PARAS, MD;  Location: [facility];  Service:  Orthopedics;  Laterality: Right;  diabetic - oral meds   CHOLECYSTECTOMY  2013   CLOSED REDUCTION METACARPAL WITH PERCUTANEOUS PINNING Right 07/05/2023   Procedure: MANIPULATION UNDER ANESTHESIA OF RIGHT INDEX AND LONG FINGERS WITH POSSIBLE CAPSULAR OF RIGHT INDEX AND LONG MCP JOINTS;  Surgeon: Edie Norleen PARAS, MD;  Location: [facility];  Service: Orthopedics;  Laterality: Right;   COLONOSCOPY WITH ESOPHAGOGASTRODUODENOSCOPY (EGD)  03/03/2012   COLONOSCOPY WITH ESOPHAGOGASTRODUODENOSCOPY (EGD)  02/23/2022   COLONOSCOPY WITH ESOPHAGOGASTRODUODENOSCOPY (EGD)  01/16/1991   COLONOSCOPY WITH PROPOFOL  N/A 01/13/2018   Procedure: COLONOSCOPY WITH PROPOFOL ;  Surgeon: Viktoria Lamar DASEN, MD;  Location: [facility];  Service: Endoscopy;  Laterality: N/A;   HERNIA REPAIR  1966   I & D EXTREMITY Right 04/01/2023   Procedure: IRRIGATION AND DEBRIDEMENT EXTREMITY PREVENA PLACEMENT;  Surgeon: Edie Norleen PARAS, MD;  Location: [facility];  Service: Orthopedics;  Laterality: Right;   I-131 THYROID  ABLATION     INCISION AND DRAINAGE ABSCESS Right 03/30/2023   Procedure: INCISION AND DRAINAGE ABSCESS RIGHT HAND;  Surgeon: Edie Norleen PARAS, MD;  Location: [facility];  Service: Orthopedics;  Laterality: Right;   INCISION AND DRAINAGE OF WOUND Right 04/04/2023   Procedure: IRRIGATION AND DEBRIDEMENT RIGHT HAND DORSAL AND PALMAR INCISIONS WITH DELAYED PRIMARY CLOSURE;  Surgeon: Edie Norleen PARAS, MD;  Location: [facility];  Service: Orthopedics;  Laterality: Right;   ROTATOR CUFF REPAIR Left 2011   SHOULDER ARTHROSCOPY WITH SUBACROMIAL DECOMPRESSION, ROTATOR CUFF REPAIR AND BICEP TENDON REPAIR Right 10/21/2021   Procedure: SHOULDER ARTHROSCOPY WITH DEBRIDEMENT, DECOMPRESSION, BICEPS TENODESIS.;  Surgeon: Edie Norleen PARAS, MD;  Location: [facility];  Service: Orthopedics;  Laterality: Right;   TUBAL LIGATION  1993    OB History   No obstetric history on file.      Home Medications    Prior to Admission medications  Medication Sig  Start Date End Date Taking? Authorizing Provider  doxycycline  (VIBRAMYCIN ) 100 MG capsule Take 1 capsule (100 mg total) by mouth 2 (two) times daily for 7 days. 09/23/24 09/30/24 Yes Arvis Jolan NOVAK, PA-C  predniSONE  (DELTASONE ) 20 MG tablet Take 2 tablets (40 mg total) by mouth daily for 5 days. 09/23/24 09/28/24 Yes Arvis Jolan B, PA-C  promethazine -dextromethorphan (PROMETHAZINE -DM) 6.25-15 MG/5ML syrup Take 5 mLs by mouth 4 (four) times daily as needed. 09/23/24  Yes Arvis Jolan B, PA-C  albuterol  (PROVENTIL  HFA;VENTOLIN  HFA) 108 (90 Base) MCG/ACT inhaler Inhale 1-2 puffs into the lungs every 6 (six) hours as needed for wheezing or shortness of breath.    [provider]  calcium  carbonate (TUMS - DOSED IN MG ELEMENTAL CALCIUM ) 500 MG chewable tablet Chew 1 tablet by mouth as needed for indigestion or heartburn.    [provider]  citalopram  (CELEXA ) 20 MG tablet Take 20 mg by mouth daily.    [provider]  dapagliflozin propanediol (FARXIGA) 10 MG TABS tablet Take 10 mg by mouth every morning.    [provider]  DULoxetine (CYMBALTA) 20 MG capsule Take 20 mg by mouth daily.    [provider]  ferrous sulfate  325 (65 FE) MG tablet Take 325 mg by mouth at bedtime.    [provider]  fluticasone (FLONASE) 50 MCG/ACT nasal spray Place 2 sprays into both nostrils daily as needed for allergies or rhinitis.    [provider]  Fluticasone-Salmeterol (ADVAIR) 250-50 MCG/DOSE AEPB Inhale 1 puff into the lungs 2 (two) times daily as needed (shortness of breath).    [provider]  levocetirizine (XYZAL) 5 MG tablet Take 5 mg by mouth every evening. 01/26/22   [provider]  levothyroxine  (SYNTHROID ) 88 MCG tablet Take 88 mcg by mouth daily before breakfast.    [provider]  lisinopril (PRINIVIL,ZESTRIL) 40 MG tablet Take 40 mg by mouth every morning.    [provider]  metFORMIN (GLUCOPHAGE) 1000  MG tablet Take 1,000 mg by mouth 2 (two) times daily with a meal.    [provider]  montelukast  (SINGULAIR ) 10 MG tablet Take 10 mg by mouth daily.    [provider]  ondansetron  (ZOFRAN ) 4 MG tablet Take 1 tablet (4 mg total) by mouth every 6 (six) hours as needed for nausea. 04/07/23   Kip Lynwood Double, PA-C  oxyCODONE  (OXY IR/ROXICODONE ) 5 MG immediate release tablet Take 1-2 tablets (5-10 mg total) by mouth every 6 (six) hours as needed for moderate pain. 04/07/23   Kip Lynwood Double, PA-C  rosuvastatin  (CRESTOR ) 10 MG tablet Take 10 mg by mouth at bedtime.    [provider]    Family History Family History  Problem Relation Age of Onset   Breast cancer Mother 65   Breast cancer Sister 48   Diabetes Sister    Diabetes Father    Diabetes Brother     Social History Social History  Tobacco Use   Smoking status: Former    Current packs/day: 0.00    Average packs/day: 0.8 packs/day for 20.0 years (15.0 ttl pk-yrs)    Types: Cigarettes    Start date: 09/07/1987    Quit date: 09/07/2007    Years since quitting: 17.0   Smokeless tobacco: Never  Vaping Use   Vaping status: Never Used  Substance Use Topics   Alcohol use: Yes    Alcohol/week: 2.0 standard drinks of alcohol    Types: 2 Standard drinks or equivalent per week    Comment: occasionally (4x/yr)   Drug use: Never     Allergies   Pravastatin and Tramadol   Review of Systems Review of Systems  Constitutional:  Positive for chills, diaphoresis, fatigue and fever.  HENT:  Positive for congestion, postnasal drip, rhinorrhea and sore throat. Negative for ear pain, sinus pressure and sinus pain.   Eyes:  Negative for discharge and redness.  Respiratory:  Positive for cough and shortness of breath.   Cardiovascular:  Negative for chest pain.  Gastrointestinal:  Negative for abdominal pain, nausea and vomiting.  Musculoskeletal:  Positive for myalgias.  Skin:  Negative for color change and  rash.  Allergic/Immunologic: Negative for environmental allergies.  Neurological:  Positive for headaches. Negative for dizziness and weakness.  Hematological:  Negative for adenopathy.     Physical Exam Triage Vital Signs  No data found.  Updated Vital Signs BP (!) 162/101 (BP Location: Right Arm)   Pulse 72   Temp 99 F (37.2 C) (Oral)   Resp 14   Ht 5' 4 (1.626 m)   Wt 142 lb (64.4 kg)   SpO2 97%   BMI 24.37 kg/m       Physical Exam Vitals and nursing note reviewed.  Constitutional:      General: She is not in acute distress.    Appearance: Normal appearance. She is well-developed and normal weight. She is ill-appearing. She is not toxic-appearing.  HENT:     Head: Normocephalic and atraumatic.     Right Ear: Tympanic membrane, ear canal and external ear normal. Tympanic membrane is not erythematous.     Left Ear: Tympanic membrane, ear canal and external ear normal. Tympanic membrane is not erythematous.     Nose: Congestion present.     Mouth/Throat:     Mouth: Mucous membranes are moist.     Pharynx: Oropharynx is clear. Posterior oropharyngeal erythema (mild erythema, no tonsillar exudates or swelling) present.     Tonsils: No tonsillar exudate.  Eyes:     General: No scleral icterus.       Right eye: No discharge.        Left eye: No discharge.     Conjunctiva/sclera: Conjunctivae normal.  Cardiovascular:     Rate and Rhythm: Normal rate and regular rhythm.     Heart sounds: Normal heart sounds. No murmur heard. Pulmonary:     Effort: Pulmonary effort is normal. No respiratory distress.     Breath sounds: Normal breath sounds. No wheezing, rhonchi or rales.  Musculoskeletal:     Cervical back: Neck supple.  Lymphadenopathy:     Cervical: Cervical adenopathy present.  Skin:    General: Skin is warm and dry.     Findings: No erythema.  Neurological:     General: No focal deficit present.     Mental Status: She is alert and oriented to person, place,  and time. Mental status is at baseline.  Motor: No weakness.     Gait: Gait normal.  Psychiatric:        Mood and Affect: Mood normal.        Behavior: Behavior normal.      UC Treatments / Results  Labs (all labs ordered are listed, but only abnormal results are displayed) Labs Reviewed  POC COVID19/FLU A&B COMBO - Normal  POCT RAPID STREP A (OFFICE) - Normal    EKG   Radiology No results found.  Procedures Procedures (including critical care time)  Medications Ordered in UC Medications - No data to display  Initial Impression / Assessment and Plan / UC Course  I have reviewed the triage vital signs and the nursing notes.  Pertinent labs & imaging results that were available during my care of the patient were reviewed by me and considered in my medical decision making (see chart for details).   64 year old female with history of asthma/COPD presents for 2-day history of fever with temps up to 101 degrees, fatigue, body aches, chills, sweats, cough, congestion, runny nose and sore throat.  Reports cough is productive of yellow sputum and she feels more short of breath than usual.  No known COVID or flu exposure.  On exam she is ill-appearing but nontoxic.  Has nasal congestion, erythema posterior pharynx.  Chest is clear.  Heart regular rate and rhythm.  Rapid strep negative. Rapid COVID/flu negative.  Reviewed all results with patient.  Viral illness and COPD exacerbation.  Will treat at this time with doxycycline , prednisone , Promethazine  DM.  Encouraged her to use albuterol  inhaler if needed.  Thoroughly reviewed return precautions and ER precautions.  Acute illness with systemic symptoms.  Flareup of chronic underlying condition.   Final Clinical Impressions(s) / UC Diagnoses   Final diagnoses:  COPD exacerbation (HCC)  Viral illness  Fever, unspecified  Productive cough  Sore throat     Discharge Instructions      -Negative strep, COVID and  flu -You likely have a virus and flare up of COPD. I sent antibiotics, prednisone  and cough medicine. Use inhaler if needed. -If not breaking fever in 2-3 days, worsening cough or increased shortness of breath return or go to ER.     ED Prescriptions     Medication Sig Dispense Auth. Provider   doxycycline  (VIBRAMYCIN ) 100 MG capsule Take 1 capsule (100 mg total) by mouth 2 (two) times daily for 7 days. 14 capsule Arvis Huxley B, PA-C   predniSONE  (DELTASONE ) 20 MG tablet Take 2 tablets (40 mg total) by mouth daily for 5 days. 10 tablet Arvis Huxley B, PA-C   promethazine -dextromethorphan (PROMETHAZINE -DM) 6.25-15 MG/5ML syrup Take 5 mLs by mouth 4 (four) times daily as needed. 118 mL Arvis Huxley KATHEE DEVONNA Arvis Huxley KATHEE, [place] 09/23/24 9047  "

## 2024-09-23 NOTE — ED Triage Notes (Signed)
 Patient c/o cough, chest congestion, ear pain, headaches that started on Friday.  Patient reports fever 101.

## 2024-10-04 ENCOUNTER — Other Ambulatory Visit: Payer: Self-pay | Admitting: Physician Assistant

## 2024-10-04 DIAGNOSIS — Z1231 Encounter for screening mammogram for malignant neoplasm of breast: Secondary | ICD-10-CM

## 2024-11-26 ENCOUNTER — Encounter
